# Patient Record
Sex: Male | Born: 1935
Health system: Southern US, Community
[De-identification: ages and names within clinical notes are randomized; demographics above are authoritative.]

## PROBLEM LIST (undated history)

## (undated) DIAGNOSIS — F0280 Dementia in other diseases classified elsewhere without behavioral disturbance: Secondary | ICD-10-CM

## (undated) DIAGNOSIS — E2749 Other adrenocortical insufficiency: Secondary | ICD-10-CM

## (undated) DIAGNOSIS — H8110 Benign paroxysmal vertigo, unspecified ear: Secondary | ICD-10-CM

## (undated) DIAGNOSIS — H9190 Unspecified hearing loss, unspecified ear: Secondary | ICD-10-CM

## (undated) DIAGNOSIS — Z87898 Personal history of other specified conditions: Secondary | ICD-10-CM

## (undated) DIAGNOSIS — B029 Zoster without complications: Secondary | ICD-10-CM

## (undated) DIAGNOSIS — N400 Enlarged prostate without lower urinary tract symptoms: Secondary | ICD-10-CM

## (undated) DIAGNOSIS — R519 Headache, unspecified: Secondary | ICD-10-CM

## (undated) DIAGNOSIS — L570 Actinic keratosis: Secondary | ICD-10-CM

## (undated) DIAGNOSIS — G43109 Migraine with aura, not intractable, without status migrainosus: Secondary | ICD-10-CM

## (undated) DIAGNOSIS — R51 Headache: Secondary | ICD-10-CM

## (undated) DIAGNOSIS — Z8673 Personal history of transient ischemic attack (TIA), and cerebral infarction without residual deficits: Secondary | ICD-10-CM

## (undated) DIAGNOSIS — M25561 Pain in right knee: Secondary | ICD-10-CM

## (undated) DIAGNOSIS — C4490 Unspecified malignant neoplasm of skin, unspecified: Secondary | ICD-10-CM

## (undated) DIAGNOSIS — Z87440 Personal history of urinary (tract) infections: Secondary | ICD-10-CM

## (undated) DIAGNOSIS — I4460 Unspecified fascicular block: Secondary | ICD-10-CM

## (undated) DIAGNOSIS — M5030 Other cervical disc degeneration, unspecified cervical region: Secondary | ICD-10-CM

## (undated) DIAGNOSIS — K089 Disorder of teeth and supporting structures, unspecified: Secondary | ICD-10-CM

## (undated) DIAGNOSIS — G309 Alzheimer's disease, unspecified: Secondary | ICD-10-CM

## (undated) HISTORY — DX: Actinic keratosis: L57.0

## (undated) HISTORY — DX: Benign prostatic hyperplasia without lower urinary tract symptoms: N40.0

## (undated) HISTORY — DX: Alzheimer's disease, unspecified: G30.9

## (undated) HISTORY — DX: Unspecified malignant neoplasm of skin, unspecified: C44.90

## (undated) HISTORY — PX: TONSILLECTOMY: SHX5217

## (undated) HISTORY — DX: Unspecified fascicular block: I44.60

## (undated) HISTORY — PX: OTHER SURGICAL HISTORY: SHX169

## (undated) HISTORY — DX: Personal history of urinary (tract) infections: Z87.440

## (undated) HISTORY — DX: Benign paroxysmal vertigo, unspecified ear: H81.10

## (undated) HISTORY — DX: Unspecified hearing loss, unspecified ear: H91.90

## (undated) HISTORY — DX: Zoster without complications: B02.9

## (undated) HISTORY — DX: Personal history of other specified conditions: Z87.898

## (undated) HISTORY — DX: Disorder of teeth and supporting structures, unspecified: K08.9

## (undated) HISTORY — DX: Other cervical disc degeneration, unspecified cervical region: M50.30

## (undated) HISTORY — DX: Headache: R51

## (undated) HISTORY — DX: Personal history of transient ischemic attack (TIA), and cerebral infarction without residual deficits: Z86.73

## (undated) HISTORY — DX: Pain in right knee: M25.561

## (undated) HISTORY — DX: Dementia in other diseases classified elsewhere, unspecified severity, without behavioral disturbance, psychotic disturbance, mood disturbance, and anxiety: F02.80

## (undated) HISTORY — DX: Other adrenocortical insufficiency: E27.49

## (undated) HISTORY — DX: Migraine with aura, not intractable, without status migrainosus: G43.109

## (undated) HISTORY — DX: Headache, unspecified: R51.9

---

## 1999-06-01 DIAGNOSIS — E2749 Other adrenocortical insufficiency: Secondary | ICD-10-CM

## 1999-06-01 HISTORY — DX: Other adrenocortical insufficiency: E27.49

## 1999-10-24 ENCOUNTER — Inpatient Hospital Stay (HOSPITAL_COMMUNITY): Admission: EM | Admit: 1999-10-24 | Discharge: 1999-10-26 | Payer: Self-pay

## 1999-10-24 ENCOUNTER — Encounter: Payer: Self-pay | Admitting: Emergency Medicine

## 1999-10-25 ENCOUNTER — Encounter: Payer: Self-pay | Admitting: Surgery

## 2000-01-06 ENCOUNTER — Encounter: Payer: Self-pay | Admitting: Surgery

## 2000-01-06 ENCOUNTER — Encounter: Admission: RE | Admit: 2000-01-06 | Discharge: 2000-01-06 | Payer: Self-pay | Admitting: Surgery

## 2000-04-18 ENCOUNTER — Ambulatory Visit (HOSPITAL_COMMUNITY): Admission: RE | Admit: 2000-04-18 | Discharge: 2000-04-18 | Payer: Self-pay | Admitting: Gastroenterology

## 2000-05-05 ENCOUNTER — Encounter: Admission: RE | Admit: 2000-05-05 | Discharge: 2000-05-05 | Payer: Self-pay | Admitting: Surgery

## 2000-05-05 ENCOUNTER — Encounter: Payer: Self-pay | Admitting: Surgery

## 2001-03-27 ENCOUNTER — Encounter: Admission: RE | Admit: 2001-03-27 | Discharge: 2001-03-27 | Payer: Self-pay | Admitting: *Deleted

## 2001-03-27 ENCOUNTER — Encounter: Payer: Self-pay | Admitting: *Deleted

## 2005-02-04 ENCOUNTER — Emergency Department (HOSPITAL_COMMUNITY): Admission: EM | Admit: 2005-02-04 | Discharge: 2005-02-04 | Payer: Self-pay | Admitting: Emergency Medicine

## 2005-04-07 HISTORY — PX: PROSTATE BIOPSY: SHX241

## 2006-02-04 ENCOUNTER — Encounter: Admission: RE | Admit: 2006-02-04 | Discharge: 2006-02-04 | Payer: Self-pay | Admitting: Neurology

## 2007-06-01 DIAGNOSIS — M25561 Pain in right knee: Secondary | ICD-10-CM

## 2007-06-01 HISTORY — DX: Pain in right knee: M25.561

## 2009-08-29 DIAGNOSIS — Z87440 Personal history of urinary (tract) infections: Secondary | ICD-10-CM

## 2009-08-29 HISTORY — DX: Personal history of urinary (tract) infections: Z87.440

## 2011-06-09 DIAGNOSIS — Z23 Encounter for immunization: Secondary | ICD-10-CM | POA: Diagnosis not present

## 2011-06-29 DIAGNOSIS — L570 Actinic keratosis: Secondary | ICD-10-CM | POA: Diagnosis not present

## 2011-06-29 DIAGNOSIS — Z85828 Personal history of other malignant neoplasm of skin: Secondary | ICD-10-CM | POA: Diagnosis not present

## 2011-07-29 DIAGNOSIS — G3184 Mild cognitive impairment, so stated: Secondary | ICD-10-CM | POA: Diagnosis not present

## 2011-07-29 DIAGNOSIS — M47812 Spondylosis without myelopathy or radiculopathy, cervical region: Secondary | ICD-10-CM | POA: Diagnosis not present

## 2011-07-29 DIAGNOSIS — M542 Cervicalgia: Secondary | ICD-10-CM | POA: Diagnosis not present

## 2011-08-03 DIAGNOSIS — G3184 Mild cognitive impairment, so stated: Secondary | ICD-10-CM | POA: Diagnosis not present

## 2011-08-03 DIAGNOSIS — R413 Other amnesia: Secondary | ICD-10-CM | POA: Diagnosis not present

## 2011-08-05 DIAGNOSIS — M542 Cervicalgia: Secondary | ICD-10-CM | POA: Diagnosis not present

## 2011-08-06 ENCOUNTER — Other Ambulatory Visit: Payer: Self-pay | Admitting: Neurology

## 2011-08-06 DIAGNOSIS — R413 Other amnesia: Secondary | ICD-10-CM

## 2011-08-12 ENCOUNTER — Ambulatory Visit
Admission: RE | Admit: 2011-08-12 | Discharge: 2011-08-12 | Disposition: A | Discharge status: Home / Self Care | Payer: Self-pay | Source: Ambulatory Visit | Attending: Neurology | Admitting: Neurology

## 2011-08-12 ENCOUNTER — Ambulatory Visit
Admission: RE | Admit: 2011-08-12 | Discharge: 2011-08-12 | Disposition: A | Discharge status: Home / Self Care | Payer: Medicare Other | Source: Ambulatory Visit | Attending: Neurology | Admitting: Neurology

## 2011-08-12 DIAGNOSIS — N401 Enlarged prostate with lower urinary tract symptoms: Secondary | ICD-10-CM | POA: Diagnosis not present

## 2011-08-12 DIAGNOSIS — R413 Other amnesia: Secondary | ICD-10-CM

## 2011-08-12 DIAGNOSIS — N138 Other obstructive and reflux uropathy: Secondary | ICD-10-CM | POA: Diagnosis not present

## 2011-08-12 DIAGNOSIS — M5030 Other cervical disc degeneration, unspecified cervical region: Secondary | ICD-10-CM | POA: Diagnosis not present

## 2011-08-12 DIAGNOSIS — N402 Nodular prostate without lower urinary tract symptoms: Secondary | ICD-10-CM | POA: Diagnosis not present

## 2011-08-12 DIAGNOSIS — R51 Headache: Secondary | ICD-10-CM | POA: Diagnosis not present

## 2011-08-12 DIAGNOSIS — R972 Elevated prostate specific antigen [PSA]: Secondary | ICD-10-CM | POA: Diagnosis not present

## 2011-08-17 DIAGNOSIS — M542 Cervicalgia: Secondary | ICD-10-CM | POA: Diagnosis not present

## 2011-08-19 DIAGNOSIS — M542 Cervicalgia: Secondary | ICD-10-CM | POA: Diagnosis not present

## 2011-08-25 DIAGNOSIS — M542 Cervicalgia: Secondary | ICD-10-CM | POA: Diagnosis not present

## 2011-08-27 DIAGNOSIS — M47812 Spondylosis without myelopathy or radiculopathy, cervical region: Secondary | ICD-10-CM | POA: Diagnosis not present

## 2011-08-27 DIAGNOSIS — G3184 Mild cognitive impairment, so stated: Secondary | ICD-10-CM | POA: Diagnosis not present

## 2011-08-27 DIAGNOSIS — M542 Cervicalgia: Secondary | ICD-10-CM | POA: Diagnosis not present

## 2011-09-20 DIAGNOSIS — M542 Cervicalgia: Secondary | ICD-10-CM | POA: Diagnosis not present

## 2011-09-20 DIAGNOSIS — G3184 Mild cognitive impairment, so stated: Secondary | ICD-10-CM | POA: Diagnosis not present

## 2011-10-13 ENCOUNTER — Ambulatory Visit (INDEPENDENT_AMBULATORY_CARE_PROVIDER_SITE_OTHER): Payer: Medicare Other | Admitting: Family Medicine

## 2011-10-13 ENCOUNTER — Encounter: Payer: Self-pay | Admitting: Family Medicine

## 2011-10-13 ENCOUNTER — Encounter: Payer: Self-pay | Admitting: *Deleted

## 2011-10-13 VITALS — BP 121/81 | HR 66 | Wt 152.0 lb

## 2011-10-13 DIAGNOSIS — R413 Other amnesia: Secondary | ICD-10-CM

## 2011-10-13 DIAGNOSIS — Z8673 Personal history of transient ischemic attack (TIA), and cerebral infarction without residual deficits: Secondary | ICD-10-CM

## 2011-10-13 DIAGNOSIS — F0390 Unspecified dementia without behavioral disturbance: Secondary | ICD-10-CM | POA: Insufficient documentation

## 2011-10-13 DIAGNOSIS — N400 Enlarged prostate without lower urinary tract symptoms: Secondary | ICD-10-CM | POA: Diagnosis not present

## 2011-10-13 DIAGNOSIS — R51 Headache: Secondary | ICD-10-CM

## 2011-10-13 DIAGNOSIS — R519 Headache, unspecified: Secondary | ICD-10-CM | POA: Insufficient documentation

## 2011-10-13 LAB — COMPREHENSIVE METABOLIC PANEL
ALT: 21 U/L (ref 0–53)
AST: 29 U/L (ref 0–37)
Albumin: 3.8 g/dL (ref 3.5–5.2)
Alkaline Phosphatase: 69 U/L (ref 39–117)
BUN: 12 mg/dL (ref 6–23)
CO2: 27 mEq/L (ref 19–32)
Calcium: 9.4 mg/dL (ref 8.4–10.5)
Chloride: 103 mEq/L (ref 96–112)
Creatinine, Ser: 1.1 mg/dL (ref 0.4–1.5)
GFR: 70.51 mL/min (ref >60.00)
Glucose, Bld: 82 mg/dL (ref 70–99)
Potassium: 4.2 mEq/L (ref 3.5–5.1)
Sodium: 138 mEq/L (ref 135–145)
Total Bilirubin: 0.8 mg/dL (ref 0.3–1.2)
Total Protein: 6.5 g/dL (ref 6.0–8.3)

## 2011-10-13 LAB — CBC WITH DIFFERENTIAL/PLATELET
Basophils Absolute: 0 10*3/uL (ref 0.0–0.1)
Basophils Relative: 0.3 % (ref 0.0–3.0)
Eosinophils Absolute: 0.2 10*3/uL (ref 0.0–0.7)
Eosinophils Relative: 2.4 % (ref 0.0–5.0)
HCT: 44.5 % (ref 39.0–52.0)
Hemoglobin: 14.9 g/dL (ref 13.0–17.0)
Lymphocytes Relative: 12.5 % (ref 12.0–46.0)
Lymphs Abs: 0.9 10*3/uL (ref 0.7–4.0)
MCHC: 33.4 g/dL (ref 30.0–36.0)
MCV: 100 fl (ref 78.0–100.0)
Monocytes Absolute: 0.8 10*3/uL (ref 0.1–1.0)
Monocytes Relative: 11.5 % (ref 3.0–12.0)
Neutro Abs: 5.4 10*3/uL (ref 1.4–7.7)
Neutrophils Relative %: 73.3 % (ref 43.0–77.0)
Platelets: 270 10*3/uL (ref 150.0–400.0)
RBC: 4.45 Mil/uL (ref 4.22–5.81)
RDW: 14 % (ref 11.5–14.6)
WBC: 7.3 10*3/uL (ref 4.5–10.5)

## 2011-10-13 LAB — TSH: TSH: 1.41 u[IU]/mL (ref 0.35–5.50)

## 2011-10-13 NOTE — Assessment & Plan Note (Signed)
Likely related to his cervical DDD and associated limited ROM. Improving with PT.

## 2011-10-13 NOTE — Assessment & Plan Note (Signed)
As per urologist.  I think his mild chronic pressure in groin region is related partially to this. Will obtain urologist's records.

## 2011-10-13 NOTE — Progress Notes (Signed)
Office Note 10/13/2011  CC:  Chief Complaint  Patient presents with  . Establish Care    discuss memory, headaches and prostate issues    HPI:  Mitchell Abbott is a 78 y.o. White male who is here alone today to establish care.  His wife comes here to see Dr. Abner Greenspan.  Wife told him to establish here, esp b/c he lives just down the road. Patient's most recent primary MD: he can't recall MD's name, but thinks it was at [facility]. Old records were not reviewed prior to or during today's visit.  Patient had some difficulty with recall during our history taking, so some details regarding dates, locations, names are fuzzy and we'll need to check old records for accuracy.  He essentially has no acute complaint today, has been getting problems managed by his neurologist and his urologist.  Has not seen primary MD much at all.  He cannot recall last cholesterol check or routine screening lab panel for glucose, electrolytes, liver testing, thyroid function.  He is still mildly bothered by a feeling of pressure in the prostate region but this was significantly improved with change from briefs to wearing boxer shorts.  He did get full urology eval for this complaint earlier this year and he was told there was no change to his exam and that his PSA was fine.  He seems to be responding well to some PT that his neurologist ordered for his cervico-cranial headache syndrome lately.   He did get some upper and lower GI endoscopy and other GI procedures in the past that we will get more detail on when we get Dr. Marlane Hatcher records.  Past Medical History  Diagnosis Date  . Hearing deficit     Bilateral; noise damage in [employer].  Has had hearing aids since about 2008.  . Migraine headache onset in teens    No migraines since about 2004  . BPH (benign prostatic hypertrophy)     Pt reports PSA was 1.2 in 2013 at urologist's (Dr. Vernie Ammons).  . Headache in back of head     Dr. Clarisse Gouge,  neurologist, treating him with PT for this problem and it helps  . Dementia     Dr. Clarisse Gouge; started aricept around 08/2011.  MRI 2013 showed extensive white matter disease; no acute problems.  . History of TIA (transient ischemic attack) @ 2008    02/2001 and 01/2006 carotid dopplers showed 50% or less stenosis.  . DDD (degenerative disc disease), cervical     C5-6, C6-7 primarily  . History of solitary pulmonary nodule     Initially noted on CT abd for his adrenal hemorrhage s/p fall from ladder 2001.  F/u CT chest 04/2000 showed that it had shrunk from 12mm to 6mm and had no malignant features.  . Adrenal hemorrhage 2001    s/p fall from ladder--a f/u CT abd showed resolution of the hemorrhage- (duodenal polyp incidentally noted; repeat scan at later date OR endoscopy was recommended to f/u this finding.).      Past Surgical History  Procedure Date  . Prostate biopsy approx 2012    benign per pt  . Tonsillectomy preteen  . Dental     iimplants    History reviewed. No pertinent family history. Patient doesn't recall any of this information.  History   Social History  . Marital Status: Married    Spouse Name: N/A    Number of Children: N/A  . Years of Education: N/A  Occupational History  . Not on file.   Social History Main Topics  . Smoking status: Former Smoker -- 2 years    Types: Cigarettes  . Smokeless tobacco: Never Used  . Alcohol Use: Yes     "a good drink every other day"  . Drug Use: No  . Sexually Active: Not on file   Other Topics Concern  . 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.Lives in [place].Orig from [address] of [place], relocated to [place] in late 1970s.Has 3 children from first marriage, one from second marriage.One daughter is an OB/GYN in [place], [place].No signif hx of smoking, drinks a whisky drink qod.Exercise: 3 X /week goes to  "the club" and does nautilus and walking machine, also swims 3 x/week.    Outpatient Encounter Prescriptions as of 10/13/2011  Medication Sig Dispense Refill  . alfuzosin (UROXATRAL) 10 MG 24 hr tablet Take 10 mg by mouth daily.      Marland Kitchen donepezil (ARICEPT) 5 MG tablet Take 5 mg by mouth at bedtime.        No Known Allergies  ROS Review of Systems  Constitutional: Negative for fever and fatigue.  HENT: Negative for congestion and sore throat.   Eyes: Negative for visual disturbance.  Respiratory: Negative for cough.   Cardiovascular: Negative for chest pain.  Gastrointestinal: Negative for nausea and abdominal pain.  Genitourinary: Negative for dysuria.  Musculoskeletal: Negative for back pain and joint swelling.  Skin: Negative for rash.  Neurological: Positive for headaches (as per HPI). Negative for weakness.  Hematological: Negative for adenopathy.    PE; Blood pressure 121/81, pulse 66, weight 152 lb (68.947 kg), SpO2 98.00%. Gen: Alert, well appearing.  Patient is oriented to person, place, time, and situation. ENT: Ears: EACs clear, normal epithelium.  TMs with good light reflex and landmarks bilaterally.  Eyes: no injection, icteris, swelling, or exudate.  EOMI, PERRLA. Nose: no drainage or turbinate edema/swelling.  No injection or focal lesion.  Mouth: lips without lesion/swelling.  Oral mucosa pink and moist.  Oropharynx without erythema, exudate, or swelling.  Neck: supple/nontender.  No LAD, mass, or TM.  Carotid pulses 2+ bilaterally, without bruits. CV: RRR, no m/r/g.   LUNGS: CTA bilat, nonlabored resps, good aeration in all lung fields. ABD: soft, NT, ND, BS normal.  No hepatospenomegaly or mass.  No bruits. EXT: no clubbing, cyanosis, or edema.   Pertinent labs:  None today  ASSESSMENT AND PLAN:   New pt, difficult historian, need all old records.  Dementia Managed as per neurologist, Dr. Clarisse Gouge.  Will obtain his records.  BPH (benign prostatic  hypertrophy) As per urologist.  I think his mild chronic pressure in groin region is related partially to this. Will obtain urologist's records.  Headache disorder Likely related to his cervical DDD and associated limited ROM. Improving with PT.   GI findings in the past: duodenal polyp.  I know he had upper and lower endoscopy in the past as well as a biopsy so we'll ask Dr. Ewing Schlein for records.  Return in about 1 month (around 11/13/2011) for general recheck after med records received.

## 2011-10-13 NOTE — Assessment & Plan Note (Signed)
Managed as per neurologist, Dr. Clarisse Gouge.  Will obtain his records.

## 2011-10-14 ENCOUNTER — Encounter: Payer: Self-pay | Admitting: Family Medicine

## 2011-10-19 DIAGNOSIS — Z8673 Personal history of transient ischemic attack (TIA), and cerebral infarction without residual deficits: Secondary | ICD-10-CM | POA: Insufficient documentation

## 2011-10-20 ENCOUNTER — Other Ambulatory Visit: Payer: Medicare Other

## 2011-10-27 ENCOUNTER — Encounter: Payer: Self-pay | Admitting: Family Medicine

## 2011-11-16 DIAGNOSIS — L570 Actinic keratosis: Secondary | ICD-10-CM | POA: Diagnosis not present

## 2011-11-16 DIAGNOSIS — Z85828 Personal history of other malignant neoplasm of skin: Secondary | ICD-10-CM | POA: Diagnosis not present

## 2011-11-16 DIAGNOSIS — C44721 Squamous cell carcinoma of skin of unspecified lower limb, including hip: Secondary | ICD-10-CM | POA: Diagnosis not present

## 2011-11-18 ENCOUNTER — Ambulatory Visit (INDEPENDENT_AMBULATORY_CARE_PROVIDER_SITE_OTHER): Payer: Medicare Other | Admitting: Family Medicine

## 2011-11-18 ENCOUNTER — Encounter: Payer: Self-pay | Admitting: Family Medicine

## 2011-11-18 VITALS — BP 111/72 | HR 67 | Ht 66.5 in | Wt 154.0 lb

## 2011-11-18 DIAGNOSIS — N400 Enlarged prostate without lower urinary tract symptoms: Secondary | ICD-10-CM

## 2011-11-18 DIAGNOSIS — F0390 Unspecified dementia without behavioral disturbance: Secondary | ICD-10-CM

## 2011-11-18 DIAGNOSIS — R51 Headache: Secondary | ICD-10-CM

## 2011-11-18 DIAGNOSIS — R519 Headache, unspecified: Secondary | ICD-10-CM

## 2011-11-18 NOTE — Progress Notes (Signed)
OFFICE NOTE  11/20/2011  CC:  Chief Complaint  Patient presents with  . Follow-up    memory, prostate after records received     HPI: Patient is a 78 y.o. Caucasian male who is here for 1 month f/u after records received.  He was a new pt last visit and due to some dementia he didn't have much complete info and we decided to have him come back after records were received. His wife is with him today.   He apparently saw his urologist since his last visit with me, was having some pressure-like suprapubic discomfort, apparently eval was done and they seem to say nothing showed up, no meds changed.  I have no record of this visit.  They have questions for me about his specialists' visits (urologist and neurologist), and we discussed some general BPH issues, general dementia issues.  Overall his wife and he thing that he hasn't responded any significant amount to the aricept that was started and titrated up to 10mg  in the last few months.  He apparently also had some skin lesions removed at his dermatologist's recently--Dr. Stefanie Libel in [place].  Pertinent PMH:  Past Medical History  Diagnosis Date  . Hearing deficit     Bilateral; noise damage in [employer].  Has had hearing aids since about 2008.  . Migraine headache with aura onset in teens    No migraines since about 2004  . BPH (benign prostatic hypertrophy)     History of acute urinary retention (2006).  Hx of elevated PSA (7.26) + right sided prostate nodule on DRE by urologist.  PSA did not go down with tx with antibiotics--biopsy was done and showed NO MALIGNANCY but some chronic inflammation was present--turned out related to acute cystitis (enterococcus UTI).  PSA was 1.17 in 07/2011 at urologist's (Dr. Vernie Ammons).  . Headache in back of head     Dr. Clarisse Gouge, neurologist, treating him with PT for this problem and it helps  . Dementia     Early/mild alzheimer's:  Dr. Clarisse Gouge; started aricept around 08/2011.  MRI 2013 showed extensive  white matter disease; no acute problems.  . History of TIA (transient ischemic attack) @ 2008    02/2001 and 01/2006 carotid dopplers showed 50% or less stenosis.  . DDD (degenerative disc disease), cervical     C5-6, C6-7 primarily  . History of solitary pulmonary nodule     Initially noted on CT abd for his adrenal hemorrhage s/p fall from ladder 2001.  F/u CT chest 04/2000 showed that it had shrunk from 12mm to 6mm and had no malignant features.  . Adrenal hemorrhage 2001    s/p fall from ladder--a f/u CT abd showed resolution of the hemorrhage- (duodenal polyp incidentally noted;  f/u EGD by Dr. Ewing Schlein 03/2000 showed small lipomatous mass in duodenum which was confirmed by biopsy.  Marland Kitchen BPPV (benign paroxysmal positional vertigo)   . History of acute cystitis 08/2009    with acute prostatitis (enterococcus faecalis)  . Actinic keratoses   . Left bundle branch hemiblock     mentioned in dx of old records 04/15/2009  . Arthralgia of knee, right 2009    exam suspicious for meniscal origin    MEDS:  Outpatient Prescriptions Prior to Visit  Medication Sig Dispense Refill  . alfuzosin (UROXATRAL) 10 MG 24 hr tablet Take 10 mg by mouth daily.      Marland Kitchen aspirin 81 MG tablet Take 81 mg by mouth daily.      Marland Kitchen donepezil (  ARICEPT) 5 MG tablet Take 5 mg by mouth at bedtime.        PE: Blood pressure 111/72, pulse 67, height 5' 6.5" (1.689 m), weight 154 lb (69.854 kg). Gen: Alert, well appearing.  Patient is oriented to person, place, time, and situation. Pt displays lucid thought and speech, with occasional word finding difficulty.  Affect pleasant, witty, humorous. No further exam today.  IMPRESSION AND PLAN:  Dementia No signif response to aricept.  He has a neurologist, Dr. Clarisse Gouge, who is working on this problem with him. Will try to get up to date notes and keep abreast of things with this problem.  BPH (benign prostatic hypertrophy) Question of residual symptoms but he has recently seen  his urologist, so I'll ask for this note to get more info.  Headache disorder Occipital HA's: This has apparently improved with PT, and seemed to be secondary to cervical spine DDD.   Hx of TIA: We'll have him return for recheck in October this year and get FLP at that time.  Continue Aspirin 81mg  qd.  FOLLOW UP: 4 mo

## 2011-11-20 NOTE — Assessment & Plan Note (Signed)
Occipital HA's: This has apparently improved with PT, and seemed to be secondary to cervical spine DDD.

## 2011-11-20 NOTE — Assessment & Plan Note (Signed)
No signif response to aricept.  He has a neurologist, Dr. Clarisse Gouge, who is working on this problem with him. Will try to get up to date notes and keep abreast of things with this problem.

## 2011-11-20 NOTE — Assessment & Plan Note (Signed)
Question of residual symptoms but he has recently seen his urologist, so I'll ask for this note to get more info.

## 2011-11-23 DIAGNOSIS — G309 Alzheimer's disease, unspecified: Secondary | ICD-10-CM | POA: Diagnosis not present

## 2011-11-23 DIAGNOSIS — F0280 Dementia in other diseases classified elsewhere without behavioral disturbance: Secondary | ICD-10-CM | POA: Diagnosis not present

## 2011-11-25 ENCOUNTER — Telehealth: Payer: Self-pay | Admitting: Family Medicine

## 2011-12-07 ENCOUNTER — Other Ambulatory Visit: Payer: Self-pay | Admitting: Family Medicine

## 2011-12-07 DIAGNOSIS — G3184 Mild cognitive impairment, so stated: Secondary | ICD-10-CM

## 2011-12-07 NOTE — Telephone Encounter (Signed)
See referral order I put in for this today.-thx

## 2011-12-20 DIAGNOSIS — G459 Transient cerebral ischemic attack, unspecified: Secondary | ICD-10-CM | POA: Diagnosis not present

## 2011-12-20 DIAGNOSIS — H6120 Impacted cerumen, unspecified ear: Secondary | ICD-10-CM | POA: Diagnosis not present

## 2011-12-20 DIAGNOSIS — N400 Enlarged prostate without lower urinary tract symptoms: Secondary | ICD-10-CM | POA: Diagnosis not present

## 2011-12-20 DIAGNOSIS — F068 Other specified mental disorders due to known physiological condition: Secondary | ICD-10-CM | POA: Diagnosis not present

## 2011-12-23 DIAGNOSIS — Z85828 Personal history of other malignant neoplasm of skin: Secondary | ICD-10-CM | POA: Diagnosis not present

## 2011-12-30 DIAGNOSIS — C44721 Squamous cell carcinoma of skin of unspecified lower limb, including hip: Secondary | ICD-10-CM | POA: Diagnosis not present

## 2011-12-31 ENCOUNTER — Other Ambulatory Visit: Payer: Medicare Other

## 2011-12-31 DIAGNOSIS — R413 Other amnesia: Secondary | ICD-10-CM | POA: Diagnosis not present

## 2012-01-05 ENCOUNTER — Encounter: Payer: Self-pay | Admitting: Family Medicine

## 2012-01-05 ENCOUNTER — Ambulatory Visit (INDEPENDENT_AMBULATORY_CARE_PROVIDER_SITE_OTHER): Payer: Medicare Other | Admitting: Family Medicine

## 2012-01-05 VITALS — BP 135/82 | HR 57 | Temp 97.2°F | Ht 66.5 in | Wt 151.0 lb

## 2012-01-05 DIAGNOSIS — Z0000 Encounter for general adult medical examination without abnormal findings: Secondary | ICD-10-CM

## 2012-01-05 DIAGNOSIS — R5383 Other fatigue: Secondary | ICD-10-CM | POA: Diagnosis not present

## 2012-01-05 DIAGNOSIS — N400 Enlarged prostate without lower urinary tract symptoms: Secondary | ICD-10-CM

## 2012-01-05 DIAGNOSIS — E538 Deficiency of other specified B group vitamins: Secondary | ICD-10-CM

## 2012-01-05 DIAGNOSIS — R413 Other amnesia: Secondary | ICD-10-CM

## 2012-01-05 DIAGNOSIS — F0390 Unspecified dementia without behavioral disturbance: Secondary | ICD-10-CM | POA: Diagnosis not present

## 2012-01-05 DIAGNOSIS — I6529 Occlusion and stenosis of unspecified carotid artery: Secondary | ICD-10-CM

## 2012-01-05 DIAGNOSIS — Z23 Encounter for immunization: Secondary | ICD-10-CM | POA: Diagnosis not present

## 2012-01-05 DIAGNOSIS — N320 Bladder-neck obstruction: Secondary | ICD-10-CM | POA: Diagnosis not present

## 2012-01-05 DIAGNOSIS — Z125 Encounter for screening for malignant neoplasm of prostate: Secondary | ICD-10-CM | POA: Diagnosis not present

## 2012-01-05 DIAGNOSIS — Z87898 Personal history of other specified conditions: Secondary | ICD-10-CM

## 2012-01-05 DIAGNOSIS — R5381 Other malaise: Secondary | ICD-10-CM

## 2012-01-05 LAB — LIPID PANEL
Cholesterol: 173 mg/dL (ref 0–200)
HDL: 51.6 mg/dL (ref >39.00)
LDL Cholesterol: 110 mg/dL — ABNORMAL HIGH (ref 0–99)
Total CHOL/HDL Ratio: 3
Triglycerides: 58 mg/dL (ref 0.0–149.0)
VLDL: 11.6 mg/dL (ref 0.0–40.0)

## 2012-01-05 LAB — URINALYSIS, ROUTINE W REFLEX MICROSCOPIC
Bilirubin Urine: NEGATIVE
Hgb urine dipstick: NEGATIVE
Ketones, ur: NEGATIVE
Leukocytes, UA: NEGATIVE
Nitrite: NEGATIVE
Specific Gravity, Urine: 1.015 (ref 1.000–1.030)
Total Protein, Urine: NEGATIVE
Urine Glucose: NEGATIVE
Urobilinogen, UA: 0.2 (ref 0.0–1.0)
pH: 6 (ref 5.0–8.0)

## 2012-01-05 LAB — PSA, MEDICARE: PSA: 1.3 ng/ml (ref 0.10–4.00)

## 2012-01-05 NOTE — Progress Notes (Addendum)
Office Note 01/05/2012  CC:  Chief Complaint  Patient presents with  . Annual Exam    no problems    HPI:  Mitchell Abbott is a 78 y.o. White male who is here by himself today for health maintenance exam. Says he is feeling well.  Saw new neurologist and he and his wife were pleased with her, although he can not tell me any further details about his visit.  No records available yet.   Says his BPH sx's are relatively well controlled on current alpha blocker, denies side effect.  Says he has nocturia avg 3 times per night, says daytime urgency/frequency are not really a problem.  Only has dysuria if he attempts to hold urine too long.   Last note from [facility] was from 08/2010--this is when last DRE and PSA were done. He mentions vit B12 today, not sure if he means he needs this shot or he needs vit b12 level checked.  He seems to not be sure if he's had a hx of this.  In review of record today I cannot find that a B12 test has been done, so I'll check on this today. Also, has hx of carotid artery stenosis/TIA, so will check FLP today.  Past Medical History  Diagnosis Date  . Hearing deficit     Bilateral; noise damage in [employer].  Has had hearing aids since about 2008.  . Migraine headache with aura onset in teens    No migraines since about 2004  . BPH (benign prostatic hypertrophy)     History of acute urinary retention (2006).  Hx of elevated PSA (7.26) + right sided prostate nodule on DRE by urologist.  PSA did not go down with tx with antibiotics--biopsy was done and showed NO MALIGNANCY but some chronic inflammation was present--turned out related to acute cystitis (enterococcus UTI).  PSA was 1.17 in 07/2011 at urologist's (Dr. Vernie Ammons).  . Headache in back of head     Dr. Clarisse Gouge, neurologist, treating him with PT for this problem and it helps  . Dementia     Early/mild alzheimer's:  Dr. Clarisse Gouge; started aricept around 08/2011.  MRI 2013 showed extensive white matter  disease; no acute problems.  . History of TIA (transient ischemic attack) @ 2008    02/2001 and 01/2006 carotid dopplers showed 50% or less stenosis.  . DDD (degenerative disc disease), cervical     C5-6, C6-7 primarily  . History of solitary pulmonary nodule     Initially noted on CT abd for his adrenal hemorrhage s/p fall from ladder 2001.  F/u CT chest 04/2000 showed that it had shrunk from 12mm to 6mm and had no malignant features.  . Adrenal hemorrhage 2001    s/p fall from ladder--a f/u CT abd showed resolution of the hemorrhage- (duodenal polyp incidentally noted;  f/u EGD by Dr. Ewing Schlein 03/2000 showed small lipomatous mass in duodenum which was confirmed by biopsy.  Marland Kitchen BPPV (benign paroxysmal positional vertigo)   . History of acute cystitis 08/2009    with acute prostatitis (enterococcus faecalis)  . Actinic keratoses   . Left bundle branch hemiblock     mentioned in dx of old records 04/15/2009  . Arthralgia of knee, right 2009    exam suspicious for meniscal origin    Past Surgical History  Procedure Date  . Prostate biopsy 04/07/2005    For elevated PSA + right sided prostate nodule on DRE by urologist:  benign biopsy.  . Tonsillectomy preteen  .  Dental     iimplants  . Cataract extrac     Family History  Problem Relation Age of Onset  . Breast cancer Daughter   . Migraines Daughter   . Sudden death Mother 76    FH info obtained from Dr. Cherie Ouch records.    History   Social History  . Marital Status: Married    Spouse Name: N/A    Number of Children: N/A  . Years of Education: N/A   Occupational History  . Not on file.   Social History Main Topics  . Smoking status: Former Smoker -- 2 years    Types: Cigarettes  . Smokeless tobacco: Never Used  . Alcohol Use: Yes     "a good drink every other day"  . Drug Use: No  . Sexually Active: Not on file   Other Topics Concern  . 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.Lives in [place].Orig from [address] of [place], relocated to [place] in late 1970s.Has 3 children from first marriage, one from second marriage.One daughter is an OB/GYN in [place].Distant hx of smoking cigs, quit 1974, drinks a whisky drink qod.Exercise: 3 X /week goes to "the club" and does nautilus and walking machine, also swims 3 x/week.    Outpatient Prescriptions Prior to Visit  Medication Sig Dispense Refill  . alfuzosin (UROXATRAL) 10 MG 24 hr tablet Take 10 mg by mouth daily.      Marland Kitchen aspirin 81 MG tablet Take 81 mg by mouth daily.      Marland Kitchen donepezil (ARICEPT) 10 MG tablet Take 10 mg by mouth at bedtime.        No Known Allergies  ROS Review of Systems  Constitutional: Positive for fatigue (mild, comes and goes). Negative for fever, chills and appetite change.  HENT: Negative for ear pain, congestion, sore throat, neck stiffness and dental problem.   Eyes: Negative for discharge, redness and visual disturbance.  Respiratory: Negative for cough, chest tightness, shortness of breath and wheezing.   Cardiovascular: Negative for chest pain, palpitations and leg swelling.  Gastrointestinal: Negative for nausea, vomiting, abdominal pain, diarrhea and blood in stool.  Genitourinary: Negative for dysuria, urgency, frequency, hematuria, flank pain and difficulty urinating.  Musculoskeletal: Negative for myalgias, back pain, joint swelling and arthralgias.  Skin: Negative for pallor and rash.  Neurological: Negative for dizziness, speech difficulty, weakness and headaches.  Hematological: Negative for adenopathy. Does not bruise/bleed easily.  Psychiatric/Behavioral: Negative for disturbed wake/sleep cycle and dysphoric mood. The patient is not nervous/anxious.     PE; Blood pressure 135/82, pulse 57, temperature 97.2 F (36.2 C), temperature source Temporal, height 5' 6.5" (1.689 m), weight 151 lb (68.493  kg). Gen: Alert, well appearing.  Patient is oriented to person, place, time, and situation. Affect: pleasant, lucid thought and speech.  Occasional stumbles/searches for the right word when speaking. ENT: Ears: EACs clear, normal epithelium.  TMs with good light reflex and landmarks bilaterally.  Eyes: no injection, icteris, swelling, or exudate.  EOMI, PERRLA. Nose: no drainage or turbinate edema/swelling.  No injection or focal lesion.  Mouth: lips without lesion/swelling.  Oral mucosa pink and moist.  Dentition intact (upper teeth are implants) and without obvious caries or gingival swelling.  Oropharynx without erythema, exudate, or swelling.  Neck: supple/nontender.  No LAD, mass, or TM.  Carotid pulses 2+ bilaterally, without bruits.  CV: RRR, no m/r/g.   LUNGS: CTA bilat, nonlabored resps, good aeration in all lung fields. ABD: soft, NT, ND, BS normal.  No hepatospenomegaly or mass.  No bruits. EXT: no clubbing, cyanosis, or edema.  Skin - no sores or suspicious lesions or rashes or color changes Neuro: CN 2-12 intact bilaterally, strength 5/5 in proximal and distal upper extremities and lower extremities bilaterally.  No sensory deficits.  No tremor.  No disdiadochokinesis.  No ataxia.  Upper extremity and lower extremity DTRs symmetric.  No pronator drift. Genitals normal; both testes normal without tenderness, masses, hydroceles, varicoceles, erythema or swelling. Shaft normal, circumcised, meatus normal without discharge. No inguinal hernia noted. No inguinal lymphadenopathy. Rectal exam: negative without mass, lesions or tenderness, PROSTATE EXAM: smooth and symmetric without nodules or tenderness.     Pertinent labs:  None today  ASSESSMENT AND PLAN:   Health maintenance examination Reviewed age and gender appropriate health maintenance issues (prudent diet, regular exercise, health risks of tobacco and excessive alcohol, use of seatbelts, fire alarms in home, use of sunscreen).   Also reviewed age and gender appropriate health screening as well as vaccine recommendations. There is no record of his last Td.  No record of ever having pneumovax or zostavax.   Gave pneumovax today, and will give the others at future appts. Regarding his hx of elevated PSA and BPH, will check PSA today and UA. He mentions vit B12 shots and it is unclear about whether or not he has been documented to have vit b12 deficiency, so we'll check vit B12 level today. He is happy with his neurologist and the current approach to his dementia--will obtain records for review and placement in our chart. We'll screen his lipids today.  His CBC, CMET, and TSH were normal here a few months ago.     FOLLOW UP:  Return in about 6 months (around 07/07/2012) for f/u dementia, BPH.   ADDENDUM 01/05/12: I reviewed pt's neurology consult note from 12/31/11 with Dr. Terrace Arabia at [facility]. She dx'd him with mild cognitive impairment of unclear etiology.  She ordered a vit B12 level and carotid dopplers.  She kept him on aricept 5mg  qd and started galantamine 4mg  once daily.  F/u in 2-3 mo in her office. Will cancel the vit B12 level ordered here today.

## 2012-01-05 NOTE — Addendum Note (Signed)
Addended by: Baldemar Lenis R on: 01/05/2012 12:11 PM   Modules accepted: Orders

## 2012-01-05 NOTE — Assessment & Plan Note (Signed)
Reviewed age and gender appropriate health maintenance issues (prudent diet, regular exercise, health risks of tobacco and excessive alcohol, use of seatbelts, fire alarms in home, use of sunscreen).  Also reviewed age and gender appropriate health screening as well as vaccine recommendations. There is no record of his last Td.  No record of ever having pneumovax or zostavax.   Gave pneumovax today, and will give the others at future appts. Regarding his hx of elevated PSA and BPH, will check PSA today and UA. He mentions vit B12 shots and it is unclear about whether or not he has been documented to have vit b12 deficiency, so we'll check vit B12 level today. He is happy with his neurologist and the current approach to his dementia--will obtain records for review and placement in our chart. We'll screen his lipids today.  His CBC, CMET, and TSH were normal here a few months ago.

## 2012-01-06 LAB — VITAMIN B12: Vitamin B-12: 311 pg/mL (ref 211–911)

## 2012-01-06 NOTE — Addendum Note (Signed)
Addended by: Baldemar Lenis R on: 01/06/2012 02:26 PM   Modules accepted: Orders

## 2012-01-11 DIAGNOSIS — R0989 Other specified symptoms and signs involving the circulatory and respiratory systems: Secondary | ICD-10-CM | POA: Diagnosis not present

## 2012-01-28 ENCOUNTER — Telehealth: Payer: Self-pay | Admitting: Family Medicine

## 2012-01-28 NOTE — Telephone Encounter (Signed)
Printed and mailed

## 2012-01-28 NOTE — Telephone Encounter (Signed)
Please mail a copy of last lab results to patient

## 2012-02-01 ENCOUNTER — Ambulatory Visit (INDEPENDENT_AMBULATORY_CARE_PROVIDER_SITE_OTHER): Payer: Medicare Other | Admitting: Family Medicine

## 2012-02-01 ENCOUNTER — Encounter: Payer: Self-pay | Admitting: Family Medicine

## 2012-02-01 VITALS — BP 110/67 | HR 67 | Temp 98.2°F | Ht 66.5 in | Wt 151.0 lb

## 2012-02-01 DIAGNOSIS — N390 Urinary tract infection, site not specified: Secondary | ICD-10-CM

## 2012-02-01 DIAGNOSIS — R350 Frequency of micturition: Secondary | ICD-10-CM

## 2012-02-01 LAB — POCT URINALYSIS DIPSTICK
Glucose, UA: NEGATIVE
Nitrite, UA: NEGATIVE
Protein, UA: 30
Spec Grav, UA: 1.025
Urobilinogen, UA: 0.2
pH, UA: 6

## 2012-02-01 MED ORDER — SULFAMETHOXAZOLE-TMP DS 800-160 MG PO TABS: 1.0000 | ORAL_TABLET | Freq: Two times a day (BID) | ORAL | Status: DC

## 2012-02-01 NOTE — Progress Notes (Signed)
OFFICE NOTE  02/01/2012  CC:  Chief Complaint  Patient presents with  . Urinary Tract Infection    frequency, discomfort; began taking sulfa yesterday     HPI: Patient is a 78 y.o. Caucasian male who is here for pt's suspicion that he has a UTI. Acute worsening of his BPH obstructive sx's about 3 days ago; notes discomfort in pubic area upon urination, more hesitancy, some incomplete emptying, urinary frequency--notes it more during night but has in daytime as well.  Says the day of onset he had temp 101 but this did not recur.  No flank pain or back pain.  No pain in rectal region, no discomfort between episodes of urinating.  No hematuria.  BMs regular and without excessive caliber or hardness.  After sx's came up a few days ago he began taking some bactrim that he had leftover at home.  Pertinent PMH:  Past Medical History  Diagnosis Date  . Hearing deficit     Bilateral; noise damage in [employer].  Has had hearing aids since about 2008.  . Migraine headache with aura onset in teens    No migraines since about 2004  . BPH (benign prostatic hypertrophy)     History of acute urinary retention (2006).  Hx of elevated PSA (7.26) + right sided prostate nodule on DRE by urologist.  PSA did not go down with tx with antibiotics--biopsy was done and showed NO MALIGNANCY but some chronic inflammation was present--turned out related to acute cystitis (enterococcus UTI).  PSA was 1.17 in 07/2011 at urologist's (Dr. Vernie Ammons).  . Headache in back of head     Dr. Clarisse Gouge, neurologist, treating him with PT for this problem and it helps  . Dementia     Early/mild alzheimer's:  Dr. Clarisse Gouge; started aricept around 08/2011.  MRI 2013 showed extensive white matter disease; no acute problems.  . History of TIA (transient ischemic attack) @ 2008    02/2001 and 01/2006 carotid dopplers showed 50% or less stenosis.  . DDD (degenerative disc disease), cervical     C5-6, C6-7 primarily  . History of solitary  pulmonary nodule     Initially noted on CT abd for his adrenal hemorrhage s/p fall from ladder 2001.  F/u CT chest 04/2000 showed that it had shrunk from 12mm to 6mm and had no malignant features.  . Adrenal hemorrhage 2001    s/p fall from ladder--a f/u CT abd showed resolution of the hemorrhage- (duodenal polyp incidentally noted;  f/u EGD by Dr. Ewing Schlein 03/2000 showed small lipomatous mass in duodenum which was confirmed by biopsy.  Marland Kitchen BPPV (benign paroxysmal positional vertigo)   . History of acute cystitis 08/2009    with acute prostatitis (enterococcus faecalis)  . Actinic keratoses   . Left bundle branch hemiblock     mentioned in dx of old records 04/15/2009  . Arthralgia of knee, right 2009    exam suspicious for meniscal origin   Past surgical, social, and family history reviewed and no changes noted since last office visit.  MEDS:  Outpatient Prescriptions Prior to Visit  Medication Sig Dispense Refill  . alfuzosin (UROXATRAL) 10 MG 24 hr tablet Take 10 mg by mouth daily.      Marland Kitchen aspirin 81 MG tablet Take 81 mg by mouth daily.      Marland Kitchen donepezil (ARICEPT) 10 MG tablet Take 5 mg by mouth at bedtime.       Marland Kitchen galantamine (RAZADYNE) 4 MG tablet Take 4 mg by mouth  daily.        PE: Blood pressure 110/67, pulse 67, temperature 98.2 F (36.8 C), temperature source Temporal, height 5' 6.5" (1.689 m), weight 151 lb (68.493 kg). Gen: Alert, well appearing.  Patient is oriented to person, place, time, and situation. No flank or CVA tenderness.  Suprapubic region is soft, nontender.  LAB: CC UA today showed trace intact blood and small LEU.  IMPRESSION AND PLAN:  UTI (lower urinary tract infection) Cystitis vs acute prostatitis. Send urine for c/s. Continue with bactrim DS bid through a 14d course.   FOLLOW UP: prn

## 2012-02-01 NOTE — Assessment & Plan Note (Signed)
Cystitis vs acute prostatitis. Send urine for c/s. Continue with bactrim DS bid through a 14d course.

## 2012-02-03 LAB — URINE CULTURE
Colony Count: NO GROWTH
Organism ID, Bacteria: NO GROWTH

## 2012-02-14 DIAGNOSIS — IMO0002 Reserved for concepts with insufficient information to code with codable children: Secondary | ICD-10-CM | POA: Diagnosis not present

## 2012-02-14 DIAGNOSIS — M5030 Other cervical disc degeneration, unspecified cervical region: Secondary | ICD-10-CM | POA: Diagnosis not present

## 2012-02-14 DIAGNOSIS — M9981 Other biomechanical lesions of cervical region: Secondary | ICD-10-CM | POA: Diagnosis not present

## 2012-02-14 DIAGNOSIS — M999 Biomechanical lesion, unspecified: Secondary | ICD-10-CM | POA: Diagnosis not present

## 2012-02-15 DIAGNOSIS — IMO0002 Reserved for concepts with insufficient information to code with codable children: Secondary | ICD-10-CM | POA: Diagnosis not present

## 2012-02-15 DIAGNOSIS — M5030 Other cervical disc degeneration, unspecified cervical region: Secondary | ICD-10-CM | POA: Diagnosis not present

## 2012-02-15 DIAGNOSIS — M9981 Other biomechanical lesions of cervical region: Secondary | ICD-10-CM | POA: Diagnosis not present

## 2012-02-15 DIAGNOSIS — M999 Biomechanical lesion, unspecified: Secondary | ICD-10-CM | POA: Diagnosis not present

## 2012-02-16 DIAGNOSIS — M5030 Other cervical disc degeneration, unspecified cervical region: Secondary | ICD-10-CM | POA: Diagnosis not present

## 2012-02-16 DIAGNOSIS — M999 Biomechanical lesion, unspecified: Secondary | ICD-10-CM | POA: Diagnosis not present

## 2012-02-16 DIAGNOSIS — IMO0002 Reserved for concepts with insufficient information to code with codable children: Secondary | ICD-10-CM | POA: Diagnosis not present

## 2012-02-16 DIAGNOSIS — M9981 Other biomechanical lesions of cervical region: Secondary | ICD-10-CM | POA: Diagnosis not present

## 2012-02-18 ENCOUNTER — Ambulatory Visit (INDEPENDENT_AMBULATORY_CARE_PROVIDER_SITE_OTHER): Payer: Medicare Other | Admitting: Family Medicine

## 2012-02-18 ENCOUNTER — Encounter: Payer: Self-pay | Admitting: Family Medicine

## 2012-02-18 VITALS — BP 115/75 | HR 62 | Ht 66.5 in | Wt 147.0 lb

## 2012-02-18 DIAGNOSIS — M9981 Other biomechanical lesions of cervical region: Secondary | ICD-10-CM | POA: Diagnosis not present

## 2012-02-18 DIAGNOSIS — M533 Sacrococcygeal disorders, not elsewhere classified: Secondary | ICD-10-CM | POA: Diagnosis not present

## 2012-02-18 DIAGNOSIS — M5030 Other cervical disc degeneration, unspecified cervical region: Secondary | ICD-10-CM | POA: Diagnosis not present

## 2012-02-18 DIAGNOSIS — IMO0002 Reserved for concepts with insufficient information to code with codable children: Secondary | ICD-10-CM | POA: Diagnosis not present

## 2012-02-18 DIAGNOSIS — M999 Biomechanical lesion, unspecified: Secondary | ICD-10-CM | POA: Diagnosis not present

## 2012-02-18 NOTE — Assessment & Plan Note (Signed)
Discussed dx. Discussed stretches, heat, massage, naprelan 500 mg bid x 4d. If not significantly improved in 1 week, will refer for PT.

## 2012-02-18 NOTE — Patient Instructions (Addendum)
Take Naprelan samples: 500 mg, 1 tab twice daily x 4d.

## 2012-02-18 NOTE — Progress Notes (Signed)
OFFICE NOTE  02/18/2012  CC:  Chief Complaint  Patient presents with  . Hip Pain    right hip x 3-4 days, felt better after swimming this AM.     HPI: Patient is a 78 y.o. Caucasian male who is here for concern of hip pain. He has 3-4 d hx of pain in a fairly focal region of right posterior pelvic/SI joint area.  Some movements make it worse, others relieve it.  The pain is mild-moderate, makes him alter his gait some.  No injury or strain recalled.  No hx of low back or hip problems at all.  Of note, his proctatitis from last o/v completely resolved nicely on the antibiotic I gave him and he is pleased.  Pertinent PMH:  Past Medical History  Diagnosis Date  . Hearing deficit     Bilateral; noise damage in [employer].  Has had hearing aids since about 2008.  . Migraine headache with aura onset in teens    No migraines since about 2004  . BPH (benign prostatic hypertrophy)     History of acute urinary retention (2006).  Hx of elevated PSA (7.26) + right sided prostate nodule on DRE by urologist.  PSA did not go down with tx with antibiotics--biopsy was done and showed NO MALIGNANCY but some chronic inflammation was present--turned out related to acute cystitis (enterococcus UTI).  PSA was 1.17 in 07/2011 at urologist's (Dr. Vernie Ammons).  . Headache in back of head     Neurologist is treating him with PT for this problem and it helps  . Dementia     Early/mild alzheimer's:  Dr. Clarisse Gouge; started aricept around 08/2011.  MRI 2013 showed extensive white matter disease; no acute problems.  Pt switched neurologic care to [facility] 11/2011.  Marland Kitchen History of TIA (transient ischemic attack) @ 2008    02/2001 and 01/2006 carotid dopplers showed 50% or less stenosis.  . DDD (degenerative disc disease), cervical     C5-6, C6-7 primarily  . History of solitary pulmonary nodule     Initially noted on CT abd for his adrenal hemorrhage s/p fall from ladder 2001.  F/u CT chest 04/2000 showed that it had  shrunk from 12mm to 6mm and had no malignant features.  . Adrenal hemorrhage 2001    s/p fall from ladder--a f/u CT abd showed resolution of the hemorrhage- (duodenal polyp incidentally noted;  f/u EGD by Dr. Ewing Schlein 03/2000 showed small lipomatous mass in duodenum which was confirmed by biopsy.  Marland Kitchen BPPV (benign paroxysmal positional vertigo)   . History of acute cystitis 08/2009    with acute prostatitis (enterococcus faecalis)  . Actinic keratoses   . Left bundle branch hemiblock     mentioned in dx of old records 04/15/2009  . Arthralgia of knee, right 2009    exam suspicious for meniscal origin    MEDS:  Outpatient Prescriptions Prior to Visit  Medication Sig Dispense Refill  . alfuzosin (UROXATRAL) 10 MG 24 hr tablet Take 10 mg by mouth daily.      Marland Kitchen aspirin 81 MG tablet Take 81 mg by mouth daily.      Marland Kitchen donepezil (ARICEPT) 10 MG tablet Take 5 mg by mouth at bedtime.       Marland Kitchen galantamine (RAZADYNE ER) 8 MG 24 hr capsule Take 8 mg by mouth 2 (two) times daily.      Marland Kitchen sulfamethoxazole-trimethoprim (BACTRIM DS) 800-160 MG per tablet Take 1 tablet by mouth 2 (two) times daily.  28  tablet  0    PE: Blood pressure 115/75, pulse 62, height 5' 6.5" (1.689 m), weight 147 lb (66.679 kg). Gen: Alert, well appearing.  Patient is oriented to person, place, time, and situation. Up on exam table ok. Mild TTP over focal area right on the right SI joint at it's beginning.  Pain with ROM of right leg, specifically with FABER maneuver--this hurts him in the right SI joint area only.  No pain in hip with hip ROM, no hip tenderness.    IMPRESSION AND PLAN:  Sacroiliac joint pain Discussed dx. Discussed stretches, heat, massage, naprelan 500 mg bid x 4d. If not significantly improved in 1 week, will refer for PT.     FOLLOW UP: prn

## 2012-02-22 DIAGNOSIS — M9981 Other biomechanical lesions of cervical region: Secondary | ICD-10-CM | POA: Diagnosis not present

## 2012-02-22 DIAGNOSIS — M5030 Other cervical disc degeneration, unspecified cervical region: Secondary | ICD-10-CM | POA: Diagnosis not present

## 2012-02-22 DIAGNOSIS — M999 Biomechanical lesion, unspecified: Secondary | ICD-10-CM | POA: Diagnosis not present

## 2012-02-22 DIAGNOSIS — IMO0002 Reserved for concepts with insufficient information to code with codable children: Secondary | ICD-10-CM | POA: Diagnosis not present

## 2012-02-24 DIAGNOSIS — M9981 Other biomechanical lesions of cervical region: Secondary | ICD-10-CM | POA: Diagnosis not present

## 2012-02-24 DIAGNOSIS — M5030 Other cervical disc degeneration, unspecified cervical region: Secondary | ICD-10-CM | POA: Diagnosis not present

## 2012-02-24 DIAGNOSIS — M999 Biomechanical lesion, unspecified: Secondary | ICD-10-CM | POA: Diagnosis not present

## 2012-02-24 DIAGNOSIS — IMO0002 Reserved for concepts with insufficient information to code with codable children: Secondary | ICD-10-CM | POA: Diagnosis not present

## 2012-02-29 DIAGNOSIS — M9981 Other biomechanical lesions of cervical region: Secondary | ICD-10-CM | POA: Diagnosis not present

## 2012-02-29 DIAGNOSIS — M999 Biomechanical lesion, unspecified: Secondary | ICD-10-CM | POA: Diagnosis not present

## 2012-02-29 DIAGNOSIS — M5030 Other cervical disc degeneration, unspecified cervical region: Secondary | ICD-10-CM | POA: Diagnosis not present

## 2012-02-29 DIAGNOSIS — IMO0002 Reserved for concepts with insufficient information to code with codable children: Secondary | ICD-10-CM | POA: Diagnosis not present

## 2012-03-02 DIAGNOSIS — R413 Other amnesia: Secondary | ICD-10-CM | POA: Diagnosis not present

## 2012-03-02 DIAGNOSIS — IMO0002 Reserved for concepts with insufficient information to code with codable children: Secondary | ICD-10-CM | POA: Diagnosis not present

## 2012-03-02 DIAGNOSIS — M9981 Other biomechanical lesions of cervical region: Secondary | ICD-10-CM | POA: Diagnosis not present

## 2012-03-02 DIAGNOSIS — M999 Biomechanical lesion, unspecified: Secondary | ICD-10-CM | POA: Diagnosis not present

## 2012-03-02 DIAGNOSIS — M5030 Other cervical disc degeneration, unspecified cervical region: Secondary | ICD-10-CM | POA: Diagnosis not present

## 2012-03-06 ENCOUNTER — Encounter: Payer: Self-pay | Admitting: Family Medicine

## 2012-03-06 DIAGNOSIS — IMO0002 Reserved for concepts with insufficient information to code with codable children: Secondary | ICD-10-CM | POA: Diagnosis not present

## 2012-03-06 DIAGNOSIS — M9981 Other biomechanical lesions of cervical region: Secondary | ICD-10-CM | POA: Diagnosis not present

## 2012-03-06 DIAGNOSIS — M5030 Other cervical disc degeneration, unspecified cervical region: Secondary | ICD-10-CM | POA: Diagnosis not present

## 2012-03-06 DIAGNOSIS — M999 Biomechanical lesion, unspecified: Secondary | ICD-10-CM | POA: Diagnosis not present

## 2012-03-08 DIAGNOSIS — M9981 Other biomechanical lesions of cervical region: Secondary | ICD-10-CM | POA: Diagnosis not present

## 2012-03-08 DIAGNOSIS — M5030 Other cervical disc degeneration, unspecified cervical region: Secondary | ICD-10-CM | POA: Diagnosis not present

## 2012-03-08 DIAGNOSIS — M999 Biomechanical lesion, unspecified: Secondary | ICD-10-CM | POA: Diagnosis not present

## 2012-03-08 DIAGNOSIS — IMO0002 Reserved for concepts with insufficient information to code with codable children: Secondary | ICD-10-CM | POA: Diagnosis not present

## 2012-03-10 DIAGNOSIS — IMO0002 Reserved for concepts with insufficient information to code with codable children: Secondary | ICD-10-CM | POA: Diagnosis not present

## 2012-03-10 DIAGNOSIS — M9981 Other biomechanical lesions of cervical region: Secondary | ICD-10-CM | POA: Diagnosis not present

## 2012-03-10 DIAGNOSIS — M999 Biomechanical lesion, unspecified: Secondary | ICD-10-CM | POA: Diagnosis not present

## 2012-03-10 DIAGNOSIS — M5030 Other cervical disc degeneration, unspecified cervical region: Secondary | ICD-10-CM | POA: Diagnosis not present

## 2012-03-14 DIAGNOSIS — M999 Biomechanical lesion, unspecified: Secondary | ICD-10-CM | POA: Diagnosis not present

## 2012-03-14 DIAGNOSIS — M5030 Other cervical disc degeneration, unspecified cervical region: Secondary | ICD-10-CM | POA: Diagnosis not present

## 2012-03-14 DIAGNOSIS — M9981 Other biomechanical lesions of cervical region: Secondary | ICD-10-CM | POA: Diagnosis not present

## 2012-03-14 DIAGNOSIS — IMO0002 Reserved for concepts with insufficient information to code with codable children: Secondary | ICD-10-CM | POA: Diagnosis not present

## 2012-03-16 DIAGNOSIS — M999 Biomechanical lesion, unspecified: Secondary | ICD-10-CM | POA: Diagnosis not present

## 2012-03-16 DIAGNOSIS — M9981 Other biomechanical lesions of cervical region: Secondary | ICD-10-CM | POA: Diagnosis not present

## 2012-03-16 DIAGNOSIS — IMO0002 Reserved for concepts with insufficient information to code with codable children: Secondary | ICD-10-CM | POA: Diagnosis not present

## 2012-03-16 DIAGNOSIS — M5030 Other cervical disc degeneration, unspecified cervical region: Secondary | ICD-10-CM | POA: Diagnosis not present

## 2012-03-20 DIAGNOSIS — IMO0002 Reserved for concepts with insufficient information to code with codable children: Secondary | ICD-10-CM | POA: Diagnosis not present

## 2012-03-20 DIAGNOSIS — M999 Biomechanical lesion, unspecified: Secondary | ICD-10-CM | POA: Diagnosis not present

## 2012-03-20 DIAGNOSIS — M5030 Other cervical disc degeneration, unspecified cervical region: Secondary | ICD-10-CM | POA: Diagnosis not present

## 2012-03-20 DIAGNOSIS — M9981 Other biomechanical lesions of cervical region: Secondary | ICD-10-CM | POA: Diagnosis not present

## 2012-03-27 DIAGNOSIS — M9981 Other biomechanical lesions of cervical region: Secondary | ICD-10-CM | POA: Diagnosis not present

## 2012-03-27 DIAGNOSIS — M999 Biomechanical lesion, unspecified: Secondary | ICD-10-CM | POA: Diagnosis not present

## 2012-03-27 DIAGNOSIS — IMO0002 Reserved for concepts with insufficient information to code with codable children: Secondary | ICD-10-CM | POA: Diagnosis not present

## 2012-03-27 DIAGNOSIS — M5030 Other cervical disc degeneration, unspecified cervical region: Secondary | ICD-10-CM | POA: Diagnosis not present

## 2012-04-03 DIAGNOSIS — M9981 Other biomechanical lesions of cervical region: Secondary | ICD-10-CM | POA: Diagnosis not present

## 2012-04-03 DIAGNOSIS — IMO0002 Reserved for concepts with insufficient information to code with codable children: Secondary | ICD-10-CM | POA: Diagnosis not present

## 2012-04-03 DIAGNOSIS — M5030 Other cervical disc degeneration, unspecified cervical region: Secondary | ICD-10-CM | POA: Diagnosis not present

## 2012-04-03 DIAGNOSIS — M999 Biomechanical lesion, unspecified: Secondary | ICD-10-CM | POA: Diagnosis not present

## 2012-04-10 DIAGNOSIS — M999 Biomechanical lesion, unspecified: Secondary | ICD-10-CM | POA: Diagnosis not present

## 2012-04-10 DIAGNOSIS — M9981 Other biomechanical lesions of cervical region: Secondary | ICD-10-CM | POA: Diagnosis not present

## 2012-04-10 DIAGNOSIS — M5030 Other cervical disc degeneration, unspecified cervical region: Secondary | ICD-10-CM | POA: Diagnosis not present

## 2012-04-10 DIAGNOSIS — IMO0002 Reserved for concepts with insufficient information to code with codable children: Secondary | ICD-10-CM | POA: Diagnosis not present

## 2012-04-24 DIAGNOSIS — IMO0002 Reserved for concepts with insufficient information to code with codable children: Secondary | ICD-10-CM | POA: Diagnosis not present

## 2012-04-24 DIAGNOSIS — M9981 Other biomechanical lesions of cervical region: Secondary | ICD-10-CM | POA: Diagnosis not present

## 2012-04-24 DIAGNOSIS — M999 Biomechanical lesion, unspecified: Secondary | ICD-10-CM | POA: Diagnosis not present

## 2012-04-24 DIAGNOSIS — M5030 Other cervical disc degeneration, unspecified cervical region: Secondary | ICD-10-CM | POA: Diagnosis not present

## 2012-05-02 DIAGNOSIS — R413 Other amnesia: Secondary | ICD-10-CM | POA: Diagnosis not present

## 2012-06-20 DIAGNOSIS — C44621 Squamous cell carcinoma of skin of unspecified upper limb, including shoulder: Secondary | ICD-10-CM | POA: Diagnosis not present

## 2012-06-20 DIAGNOSIS — Z85828 Personal history of other malignant neoplasm of skin: Secondary | ICD-10-CM | POA: Diagnosis not present

## 2012-06-29 DIAGNOSIS — Z85828 Personal history of other malignant neoplasm of skin: Secondary | ICD-10-CM | POA: Diagnosis not present

## 2012-07-05 ENCOUNTER — Encounter: Payer: Self-pay | Admitting: Family Medicine

## 2012-07-05 ENCOUNTER — Ambulatory Visit (INDEPENDENT_AMBULATORY_CARE_PROVIDER_SITE_OTHER): Payer: Medicare Other | Admitting: Family Medicine

## 2012-07-05 VITALS — BP 112/70 | HR 68 | Ht 66.5 in | Wt 151.0 lb

## 2012-07-05 DIAGNOSIS — R51 Headache: Secondary | ICD-10-CM

## 2012-07-05 DIAGNOSIS — N400 Enlarged prostate without lower urinary tract symptoms: Secondary | ICD-10-CM | POA: Diagnosis not present

## 2012-07-05 DIAGNOSIS — M899 Disorder of bone, unspecified: Secondary | ICD-10-CM

## 2012-07-05 DIAGNOSIS — M949 Disorder of cartilage, unspecified: Secondary | ICD-10-CM | POA: Diagnosis not present

## 2012-07-05 DIAGNOSIS — R29890 Loss of height: Secondary | ICD-10-CM | POA: Diagnosis not present

## 2012-07-05 DIAGNOSIS — M8580 Other specified disorders of bone density and structure, unspecified site: Secondary | ICD-10-CM

## 2012-07-05 DIAGNOSIS — R519 Headache, unspecified: Secondary | ICD-10-CM

## 2012-07-05 DIAGNOSIS — F0390 Unspecified dementia without behavioral disturbance: Secondary | ICD-10-CM

## 2012-07-05 MED ORDER — DONEPEZIL HCL 10 MG PO TABS: 10.0000 mg | ORAL_TABLET | Freq: Every evening | ORAL | Status: DC | PRN

## 2012-07-05 MED ORDER — ALFUZOSIN HCL ER 10 MG PO TB24: 10.0000 mg | ORAL_TABLET | Freq: Every day | ORAL | Status: DC

## 2012-07-05 NOTE — Progress Notes (Signed)
OFFICE NOTE  07/05/2012  CC:  Chief Complaint  Patient presents with  . Follow-up    dementia, BPH; wife is in room with patient     HPI: Patient is a 79 y.o. Caucasian male who is here for 60mo f/u for dementia, occipital/cervical HA disorder, and history of BPH with obstructive voiding sx's.   Says he is doing ok.  He says the addition of namenda to his uroxatral has caused a mild amount of lightheadedness that is bothersome/nagging.  It is not to the level of feeling like he is going to fall.  Uroxitral did not cause this effect by itself. He had no similar effect when on aricept, he and wife say they are feeling like the switch to namenda was made a bit prematurely or without sufficient reason.   Says he is not taking any pain medication.  He is not having any signif pain now.   Regarding memory and cognition, his wife says he forgets more.  Describes things like forgets where things are, easily frustrated/overwhelmed when he forgets something.   He can dress and bathe himself w/out problem, says he can drive to familiar places but admits he finds this harder to do and takes much longer to get to places than he used to.  He still does his own finances but he is beginning to make a few mistakes (payed a bill twice last month).  He admits he does not want to lose control of these types of things b/c he'll feel worthless.  Pertinent PMH:  Past Medical History  Diagnosis Date  . Hearing deficit     Bilateral; noise damage in [employer].  Has had hearing aids since about 2008.  . Migraine headache with aura onset in teens    No migraines since about 2004  . BPH (benign prostatic hypertrophy)     History of acute urinary retention (2006).  Hx of elevated PSA (7.26) + right sided prostate nodule on DRE by urologist.  PSA did not go down with tx with antibiotics--biopsy was done and showed NO MALIGNANCY but some chronic inflammation was present--turned out related to acute cystitis (enterococcus  UTI).  PSA was 1.17 in 07/2011 at urologist's (Dr. Vernie Ammons).  . Headache in back of head     Neurologist is treating him with PT for this problem and it helps  . Mild cognitive impairment     Vs early/mild alzheimer's vs CNS degenerative disorder:  Dr. Clarisse Gouge; started aricept around 08/2011.  MRI 2013 showed extensive white matter disease; no acute problems.  Pt switched neurologic care to [facility] 11/2011.  Marland Kitchen History of TIA (transient ischemic attack) @ 2008    02/2001 and 01/2006 carotid dopplers showed 50% or less stenosis.  . DDD (degenerative disc disease), cervical     C5-6, C6-7 primarily  . History of solitary pulmonary nodule     Initially noted on CT abd for his adrenal hemorrhage s/p fall from ladder 2001.  F/u CT chest 04/2000 showed that it had shrunk from 12mm to 6mm and had no malignant features.  . Adrenal hemorrhage 2001    s/p fall from ladder--a f/u CT abd showed resolution of the hemorrhage- (duodenal polyp incidentally noted;  f/u EGD by Dr. Ewing Schlein 03/2000 showed small lipomatous mass in duodenum which was confirmed by biopsy.  Marland Kitchen BPPV (benign paroxysmal positional vertigo)   . History of acute cystitis 08/2009    with acute prostatitis (enterococcus faecalis)  . Actinic keratoses   . Left  bundle branch hemiblock     mentioned in dx of old records 04/15/2009  . Arthralgia of knee, right 2009    exam suspicious for meniscal origin   Past Surgical History  Procedure Date  . Prostate biopsy 04/07/2005    For elevated PSA + right sided prostate nodule on DRE by urologist:  benign biopsy.  . Tonsillectomy preteen  . Dental     iimplants  . Cataract extrac    Past family and social history reviewed and there are no changes since the patient's last office visit with me.  MEDS:  Outpatient Prescriptions Prior to Visit  Medication Sig Dispense Refill  . aspirin 81 MG tablet Take 81 mg by mouth daily.      . Memantine HCl ER (NAMENDA XR) 28 MG CP24 Take 1 tablet by  mouth daily. As per Dr. Terrace Arabia, his neurologist      . [DISCONTINUED] alfuzosin (UROXATRAL) 10 MG 24 hr tablet Take 10 mg by mouth daily.      . [DISCONTINUED] galantamine (RAZADYNE) 4 MG tablet Take 4 mg by mouth daily.       Last reviewed on 07/05/2012  8:38 AM by Jeoffrey Massed, MD  PE: Blood pressure 112/70, pulse 68, height 5' 6.5" (1.689 m), weight 151 lb (68.493 kg). Gen: Alert, well appearing.  Patient is oriented to person, place, and situation. AFFECT: pleasant, lucid thought and speech. MMSE today: recall 1/3  Date is "the 6th or 7th" and all other date info was correct. All other questions answered fine, tasks completed successfully. CV: RRR, no m/r/g.   LUNGS: CTA bilat, nonlabored resps, good aeration in all lung fields. EXT: no clubbing, cyanosis, or edema.   IMPRESSION AND PLAN:  Dementia Gradually deteriorating but still pretty functional. He prefers to d/c namenda and go back on aricept (MAY stop namenda today, start aricept 10mg  qd tonight). Will call his neurologist, Dr. Terrace Arabia, and discuss this change with her. Hopefully his dizziness periods will resolve and his memory problems will not be any worse from this change.  BPH (benign prostatic hypertrophy) Doing fine on uroxatrol.  Headache disorder Cervicalgia/occipital HA disorder: stable lately.  No pain meds at this time.   At the very end of the visit today his wife brought up concern that he has lost a few inches in height and she wonders about osteoporosis.  I said I would order a bone density test to further evaluate this.  An After Visit Summary was printed and given to the patient.  FOLLOW UP: 72mo to evaluate after change in med today.  Hopefully we can get around to offering zostavax and Tdap at that time.

## 2012-07-06 NOTE — Assessment & Plan Note (Signed)
Cervicalgia/occipital HA disorder: stable lately.  No pain meds at this time.

## 2012-07-06 NOTE — Assessment & Plan Note (Addendum)
Gradually deteriorating but still pretty functional. He prefers to d/c namenda and go back on aricept (MAY stop namenda today, start aricept 10mg  qd tonight). Will call his neurologist, Dr. Terrace Arabia, and discuss this change with her. Hopefully his dizziness periods will resolve and his memory problems will not be any worse from this change.

## 2012-07-06 NOTE — Assessment & Plan Note (Signed)
Doing fine on uroxatrol.

## 2012-07-15 ENCOUNTER — Other Ambulatory Visit: Payer: Self-pay

## 2012-07-17 ENCOUNTER — Encounter: Payer: Self-pay | Admitting: Neurology

## 2012-07-17 ENCOUNTER — Ambulatory Visit (INDEPENDENT_AMBULATORY_CARE_PROVIDER_SITE_OTHER)
Admission: RE | Admit: 2012-07-17 | Discharge: 2012-07-17 | Disposition: A | Discharge status: Home / Self Care | Payer: Medicare Other | Source: Ambulatory Visit | Attending: Family Medicine | Admitting: Family Medicine

## 2012-07-17 DIAGNOSIS — R29890 Loss of height: Secondary | ICD-10-CM

## 2012-07-17 DIAGNOSIS — M899 Disorder of bone, unspecified: Secondary | ICD-10-CM

## 2012-07-17 DIAGNOSIS — M8580 Other specified disorders of bone density and structure, unspecified site: Secondary | ICD-10-CM

## 2012-07-25 DIAGNOSIS — R413 Other amnesia: Secondary | ICD-10-CM | POA: Diagnosis not present

## 2012-07-28 ENCOUNTER — Telehealth: Payer: Self-pay | Admitting: Family Medicine

## 2012-07-28 NOTE — Telephone Encounter (Signed)
Spoke to wife.  All questions answered.  Copy of result mailed to patient.

## 2012-07-28 NOTE — Telephone Encounter (Signed)
Patient's wife is concerned why patient is getting shorter, she is asking if there are any treatment options for her husband especially since his bone density test results came back normal. Please mail a hard copy of the bone density test results to patient for their records.

## 2012-08-02 ENCOUNTER — Ambulatory Visit (INDEPENDENT_AMBULATORY_CARE_PROVIDER_SITE_OTHER): Payer: Medicare Other | Admitting: Family Medicine

## 2012-08-02 ENCOUNTER — Encounter: Payer: Self-pay | Admitting: Family Medicine

## 2012-08-02 VITALS — BP 130/80 | HR 64 | Ht 67.5 in | Wt 149.0 lb

## 2012-08-02 DIAGNOSIS — F0390 Unspecified dementia without behavioral disturbance: Secondary | ICD-10-CM

## 2012-08-02 DIAGNOSIS — N400 Enlarged prostate without lower urinary tract symptoms: Secondary | ICD-10-CM

## 2012-08-02 MED ORDER — FINASTERIDE 5 MG PO TABS: 5.0000 mg | ORAL_TABLET | Freq: Every day | ORAL | Status: DC

## 2012-08-02 NOTE — Progress Notes (Signed)
OFFICE NOTE  08/02/2012  CC:  Chief Complaint  Patient presents with  . Follow-up    dementia, loss of height     HPI: Patient is a 79 y.o. Caucasian male who is here for 1 mo f/u dementia, changed back to aricept last month at his request. We also got a bone density test and this was normal---wife concerned about his loss of height.   His cognitive function is not significantly changed but has side effect of vivid dreams, wife concerned that it is affecting his quality of sleep.   He gets up 2-3 times per night to urinate.  Has urinary frequency during daytime as well.  Some mild problem getting urine stream started.  He dribbles at the end, has incomplete emptying.  He has been on uroxitrol for about 10 yrs and he can't be clear on how much it is helping him.      Pertinent PMH:  Past Medical History  Diagnosis Date  . Hearing deficit     Bilateral; noise damage in [employer].  Has had hearing aids since about 2008.  . Migraine headache with aura onset in teens    No migraines since about 2004  . BPH (benign prostatic hypertrophy)     History of acute urinary retention (2006).  Hx of elevated PSA (7.26) + right sided prostate nodule on DRE by urologist.  PSA did not go down with tx with antibiotics--biopsy was done and showed NO MALIGNANCY but some chronic inflammation was present--turned out related to acute cystitis (enterococcus UTI).  PSA was 1.17 in 07/2011 at urologist's (Dr. Vernie Ammons).  . Headache in back of head     Neurologist is treating him with PT for this problem and it helps  . Mild cognitive impairment     Vs early/mild alzheimer's vs CNS degenerative disorder:  Dr. Clarisse Gouge; started aricept around 08/2011.  MRI 2013 showed extensive white matter disease; no acute problems.  Pt switched neurologic care to [facility] 11/2011.  Marland Kitchen History of TIA (transient ischemic attack) @ 2008    02/2001 and 01/2006 carotid dopplers showed 50% or less stenosis.  . DDD (degenerative  disc disease), cervical     C5-6, C6-7 primarily  . History of solitary pulmonary nodule     Initially noted on CT abd for his adrenal hemorrhage s/p fall from ladder 2001.  F/u CT chest 04/2000 showed that it had shrunk from 12mm to 6mm and had no malignant features.  . Adrenal hemorrhage 2001    s/p fall from ladder--a f/u CT abd showed resolution of the hemorrhage- (duodenal polyp incidentally noted;  f/u EGD by Dr. Ewing Schlein 03/2000 showed small lipomatous mass in duodenum which was confirmed by biopsy.  Marland Kitchen BPPV (benign paroxysmal positional vertigo)   . History of acute cystitis 08/2009    with acute prostatitis (enterococcus faecalis)  . Actinic keratoses   . Left bundle branch hemiblock     mentioned in dx of old records 04/15/2009  . Arthralgia of knee, right 2009    exam suspicious for meniscal origin    MEDS:  Outpatient Prescriptions Prior to Visit  Medication Sig Dispense Refill  . alfuzosin (UROXATRAL) 10 MG 24 hr tablet Take 1 tablet (10 mg total) by mouth daily.  90 tablet  1  . donepezil (ARICEPT) 10 MG tablet Take 1 tablet (10 mg total) by mouth at bedtime as needed.  90 tablet  3  . aspirin 81 MG tablet Take 81 mg by mouth daily.      Marland Kitchen  Memantine HCl ER (NAMENDA XR) 28 MG CP24 Take by mouth.       No facility-administered medications prior to visit.    PE: Blood pressure 130/80, pulse 64, height 5' 7.5" (1.715 m), weight 149 lb (67.586 kg). Gen: Alert, well appearing.  Patient is oriented to person, place, time, and situation. AFFECT: pleasant, lucid thought and speech.  He stammers to find the right word or recall a certain thing fairly often. No further exam today.  Recent labs:   Lab Results  Component Value Date   PSA 1.30 01/05/2012    IMPRESSION AND PLAN:  BPH (benign prostatic hypertrophy) Continue uroxatral and add proscar 5mg  qd.  Therapeutic expectations and side effect profile of medication discussed today.  Patient's questions  answered.   Dementia No significant change in cognition/memory on aricept x 71mo. He has side effect of vivid dreams but is tolerating this per his report.   Reassured pt/wife that his diminished height measurement is a result of increased kyphosis with aging (from spinal ligamental laxity/chronic poor posture---not from osteoporotic fx or from loss of vertebral bone height).  Spent 30 min with pt today, with >50% of this time spent in counseling and care coordination regarding the above problems.  An After Visit Summary was printed and given to the patient.   FOLLOW UP:  6 wks

## 2012-08-03 NOTE — Assessment & Plan Note (Signed)
No significant change in cognition/memory on aricept x 29mo. He has side effect of vivid dreams but is tolerating this per his report.

## 2012-08-03 NOTE — Assessment & Plan Note (Signed)
Continue uroxatral and add proscar 5mg  qd.  Therapeutic expectations and side effect profile of medication discussed today.  Patient's questions answered.

## 2012-08-15 ENCOUNTER — Encounter: Payer: Self-pay | Admitting: Family Medicine

## 2012-08-15 ENCOUNTER — Ambulatory Visit (INDEPENDENT_AMBULATORY_CARE_PROVIDER_SITE_OTHER): Payer: Medicare Other | Admitting: Family Medicine

## 2012-08-15 VITALS — BP 139/84 | HR 59 | Temp 97.5°F | Resp 14 | Wt 148.5 lb

## 2012-08-15 DIAGNOSIS — S139XXA Sprain of joints and ligaments of unspecified parts of neck, initial encounter: Secondary | ICD-10-CM

## 2012-08-15 DIAGNOSIS — S161XXA Strain of muscle, fascia and tendon at neck level, initial encounter: Secondary | ICD-10-CM | POA: Insufficient documentation

## 2012-08-15 NOTE — Assessment & Plan Note (Signed)
Discussed heat, gentle ROM, avoid vigorous exercise for 1 wk. Dermatran anti-inflamm compounded cream rx'd today: (baclofen, doxepin, gabapentin, meloxicam, topirimate, and pentoxifylline). Apply 2g qid to affected area.

## 2012-08-15 NOTE — Progress Notes (Signed)
OFFICE NOTE  08/15/2012  CC:  Chief Complaint  Patient presents with  . Neck Pain    Pt c/o left side neck pain that radiates down from ear intermittently, worse at night.     HPI: Patient is a 79 y.o. Caucasian male who is here for cold/ST, "not feeling well". Basically, has 48h hx of left sided muscular neck pain that comes and goes depending on neck position.   It is reproduced by turning Hot pad helps.  No sx's of URI like nasal congestion, runny nose, cough, or fever.  He has exacerbation of the pain in his lateral neck when he swallows, which is what made him feel like maybe he had a cold coming on initially. No malaise, no other aches or pains.  ROS: left sided neck, shoulder muscles posteriorly are sensitive/tender when wife massages this region. He now recalls a strain when adjusting his rain barrel a few days before onset of his pain.  Pertinent PMH:  Past Medical History  Diagnosis Date  . Hearing deficit     Bilateral; noise damage in [employer].  Has had hearing aids since about 2008.  . Migraine headache with aura onset in teens    No migraines since about 2004  . BPH (benign prostatic hypertrophy)     History of acute urinary retention (2006).  Hx of elevated PSA (7.26) + right sided prostate nodule on DRE by urologist.  PSA did not go down with tx with antibiotics--biopsy was done and showed NO MALIGNANCY but some chronic inflammation was present--turned out related to acute cystitis (enterococcus UTI).  PSA was 1.17 in 07/2011 at urologist's (Dr. Vernie Ammons).  . Headache in back of head     Neurologist is treating him with PT for this problem and it helps  . Mild cognitive impairment     Vs early/mild alzheimer's vs CNS degenerative disorder:  Dr. Clarisse Gouge; started aricept around 08/2011.  MRI 2013 showed extensive white matter disease; no acute problems.  Pt switched neurologic care to [facility] 11/2011.  Marland Kitchen History of TIA (transient ischemic attack) @ 2008    02/2001  and 01/2006 carotid dopplers showed 50% or less stenosis.  . DDD (degenerative disc disease), cervical     C5-6, C6-7 primarily  . History of solitary pulmonary nodule     Initially noted on CT abd for his adrenal hemorrhage s/p fall from ladder 2001.  F/u CT chest 04/2000 showed that it had shrunk from 12mm to 6mm and had no malignant features.  . Adrenal hemorrhage 2001    s/p fall from ladder--a f/u CT abd showed resolution of the hemorrhage- (duodenal polyp incidentally noted;  f/u EGD by Dr. Ewing Schlein 03/2000 showed small lipomatous mass in duodenum which was confirmed by biopsy.  Marland Kitchen BPPV (benign paroxysmal positional vertigo)   . History of acute cystitis 08/2009    with acute prostatitis (enterococcus faecalis)  . Actinic keratoses   . Left bundle branch hemiblock     mentioned in dx of old records 04/15/2009  . Arthralgia of knee, right 2009    exam suspicious for meniscal origin    MEDS:  Outpatient Prescriptions Prior to Visit  Medication Sig Dispense Refill  . alfuzosin (UROXATRAL) 10 MG 24 hr tablet Take 1 tablet (10 mg total) by mouth daily.  90 tablet  1  . aspirin 325 MG EC tablet Take 325 mg by mouth daily.      Marland Kitchen donepezil (ARICEPT) 10 MG tablet Take 1 tablet (10 mg  total) by mouth at bedtime as needed.  90 tablet  3  . finasteride (PROSCAR) 5 MG tablet Take 1 tablet (5 mg total) by mouth daily.  30 tablet  2   No facility-administered medications prior to visit.    PE: Blood pressure 139/84, pulse 59, temperature 97.5 F (36.4 C), temperature source Oral, resp. rate 14, weight 148 lb 8 oz (67.359 kg), SpO2 96.00%. Gen: Alert, well appearing.  Patient is oriented to person, place, time, and situation. CN 2-12 intact grossly. ROM of neck intact with just a bit of stiffness with rotation and lateral bending, bringing on a slight amount of discomfort in left sided neck mm's.  Mild TTP in left SCM mm, extending into left posterior cervical muscles, and left trapezius---there is  almost a trigger point in trapezius but not quite.  No rash.  No bondy tenderness.  NO cervical or supraclavicular LAD.   UE strength 5/5 prox and dist.  Trace reflexes in biceps and triceps bilat.  IMPRESSION AND PLAN:  Cervical myofascial strain Discussed heat, gentle ROM, avoid vigorous exercise for 1 wk. Dermatran anti-inflamm compounded cream rx'd today: (baclofen, doxepin, gabapentin, meloxicam, topirimate, and pentoxifylline). Apply 2g qid to affected area.    FOLLOW UP: keep f/u already scheduled for next month

## 2012-08-23 ENCOUNTER — Encounter: Payer: Self-pay | Admitting: Family Medicine

## 2012-08-31 ENCOUNTER — Ambulatory Visit: Payer: Self-pay | Admitting: Neurology

## 2012-09-12 ENCOUNTER — Ambulatory Visit (INDEPENDENT_AMBULATORY_CARE_PROVIDER_SITE_OTHER): Payer: Medicare Other | Admitting: Family Medicine

## 2012-09-12 ENCOUNTER — Encounter: Payer: Self-pay | Admitting: Family Medicine

## 2012-09-12 VITALS — BP 124/82 | HR 68 | Ht 67.5 in | Wt 150.0 lb

## 2012-09-12 DIAGNOSIS — Z5189 Encounter for other specified aftercare: Secondary | ICD-10-CM

## 2012-09-12 DIAGNOSIS — S161XXD Strain of muscle, fascia and tendon at neck level, subsequent encounter: Secondary | ICD-10-CM

## 2012-09-12 DIAGNOSIS — N400 Enlarged prostate without lower urinary tract symptoms: Secondary | ICD-10-CM

## 2012-09-12 DIAGNOSIS — F0390 Unspecified dementia without behavioral disturbance: Secondary | ICD-10-CM

## 2012-09-12 MED ORDER — MEMANTINE HCL 28 X 5 MG & 21 X 10 MG PO TABS: ORAL_TABLET | ORAL | Status: DC

## 2012-09-12 NOTE — Assessment & Plan Note (Signed)
Resolved

## 2012-09-12 NOTE — Progress Notes (Signed)
OFFICE NOTE  09/12/2012  CC:  Chief Complaint  Patient presents with  . Follow-up    BPH     HPI: Patient is a 79 y.o. Caucasian male who is here with his wife for 6 wk f/u:   BPH, started proscar last visit.  Says proscar caused nausea and fatigue after a few doses so he stopped it.  His sx's resolved.  He is able to tolerate his voiding dysfunction for now but is interested in another med if possible in the future.  Dermatran compound took a while to get to him so by the time it got there his neck felt better already.  Aricept still causes vivid dreams and he's not convinced that it has helped his memory.  He is interested in trying a med to combine with the aricept. Reviewed past trial of namenda--? Possible dizziness and HA.  This was a trial back in February 2014 when he was not on aricept anymore. They are willing to retry it as an add-on to aricept---since they still feel like he has significant short term memory probs and cognitive dysfunction that needs improving.     Pertinent PMH:  Past Medical History  Diagnosis Date  . Hearing deficit     Bilateral; noise damage in [employer].  Has had hearing aids since about 2008.  . Migraine headache with aura onset in teens    No migraines since about 2004  . BPH (benign prostatic hypertrophy)     History of acute urinary retention (2006)  Hx of elevated PSA (7.26) + right sided prostate nodule on DRE by urologist.  PSA did not go down with tx with antibiotics--biopsy was done and showed NO MALIGNANCY but some chronic inflammation was present--turned out related to acute cystitis (enterococcus UTI).  PSA was 1.17 in 07/2011 at urologist's (Dr. Vernie Ammons).  F/u at urol is now prn as of 07/2012.  Marland Kitchen Headache in back of head     Neurologist is treating him with PT for this problem and it helps  . Mild cognitive impairment     Vs early/mild alzheimer's vs CNS degenerative disorder:  Dr. Clarisse Gouge; started aricept around 08/2011.  MRI 2013 showed  extensive white matter disease; no acute problems.  Pt switched neurologic care to [facility] 11/2011.  Marland Kitchen History of TIA (transient ischemic attack) @ 2008    02/2001 and 01/2006 carotid dopplers showed 50% or less stenosis.  . DDD (degenerative disc disease), cervical     C5-6, C6-7 primarily  . History of solitary pulmonary nodule     Initially noted on CT abd for his adrenal hemorrhage s/p fall from ladder 2001.  F/u CT chest 04/2000 showed that it had shrunk from 12mm to 6mm and had no malignant features.  . Adrenal hemorrhage 2001    s/p fall from ladder--a f/u CT abd showed resolution of the hemorrhage- (duodenal polyp incidentally noted;  f/u EGD by Dr. Ewing Schlein 03/2000 showed small lipomatous mass in duodenum which was confirmed by biopsy.  Marland Kitchen BPPV (benign paroxysmal positional vertigo)   . History of acute cystitis 08/2009    with acute prostatitis (enterococcus faecalis)  . Actinic keratoses   . Left bundle branch hemiblock     mentioned in dx of old records 04/15/2009  . Arthralgia of knee, right 2009    exam suspicious for meniscal origin    MEDS:  Outpatient Prescriptions Prior to Visit  Medication Sig Dispense Refill  . alfuzosin (UROXATRAL) 10 MG 24 hr tablet Take  1 tablet (10 mg total) by mouth daily.  90 tablet  1  . aspirin 325 MG EC tablet Take 325 mg by mouth daily.      Marland Kitchen donepezil (ARICEPT) 10 MG tablet Take 1 tablet (10 mg total) by mouth at bedtime as needed.  90 tablet  3  . finasteride (PROSCAR) 5 MG tablet Take 1 tablet (5 mg total) by mouth daily.  30 tablet  2   No facility-administered medications prior to visit.    PE: Blood pressure 124/82, pulse 68, height 5' 7.5" (1.715 m), weight 150 lb (68.04 kg). Gen: Alert, well appearing.  Patient is oriented to person, place, time, and situation. AFFECT: pleasant, lucid thought and speech. NECK: nontender, muscles are diffusely tight CV: RRR, no m/r/g.   LUNGS: CTA bilat, nonlabored resps, good aeration in  all lung fields.   IMPRESSION AND PLAN:  Dementia Continue aricept. Add on namenda: they have pills at home from prior trial, plus I gave instructions today: 5mg  qd x 1 wk, 5mg  bid x 1 week, 5mg  + 10mg  x 1 wk, then 10mg  bid. I'll see how he's doing in 3-4 wks.  Cervical myofascial strain Resolved.  BPH (benign prostatic hypertrophy) Continue alfuzosin only right now. Did not tolerate proscar. May try different 5 alpha reductase inhibitor in the future.   An After Visit Summary was printed and given to the patient.   FOLLOW UP: 3-4 wks

## 2012-09-12 NOTE — Assessment & Plan Note (Signed)
Continue aricept. Add on namenda: they have pills at home from prior trial, plus I gave instructions today: 5mg  qd x 1 wk, 5mg  bid x 1 week, 5mg  + 10mg  x 1 wk, then 10mg  bid. I'll see how he's doing in 3-4 wks.

## 2012-09-12 NOTE — Assessment & Plan Note (Signed)
Continue alfuzosin only right now. Did not tolerate proscar. May try different 5 alpha reductase inhibitor in the future.

## 2012-09-13 ENCOUNTER — Ambulatory Visit: Payer: Medicare Other | Admitting: Family Medicine

## 2012-10-10 ENCOUNTER — Ambulatory Visit (INDEPENDENT_AMBULATORY_CARE_PROVIDER_SITE_OTHER): Payer: Medicare Other | Admitting: Family Medicine

## 2012-10-10 ENCOUNTER — Encounter: Payer: Self-pay | Admitting: Family Medicine

## 2012-10-10 VITALS — BP 101/64 | HR 64 | Temp 97.6°F | Resp 14 | Ht 67.5 in | Wt 149.5 lb

## 2012-10-10 DIAGNOSIS — M79609 Pain in unspecified limb: Secondary | ICD-10-CM | POA: Diagnosis not present

## 2012-10-10 DIAGNOSIS — M79673 Pain in unspecified foot: Secondary | ICD-10-CM

## 2012-10-10 DIAGNOSIS — N400 Enlarged prostate without lower urinary tract symptoms: Secondary | ICD-10-CM

## 2012-10-10 DIAGNOSIS — F0390 Unspecified dementia without behavioral disturbance: Secondary | ICD-10-CM

## 2012-10-10 MED ORDER — MEMANTINE HCL 10 MG PO TABS: 10.0000 mg | ORAL_TABLET | Freq: Two times a day (BID) | ORAL | Status: DC

## 2012-10-10 NOTE — Progress Notes (Signed)
OFFICE NOTE  10/10/2012  CC:  Chief Complaint  Patient presents with  . Follow-up    1-mth [Medication Mgt: Namenda]     HPI: Patient is a 79 y.o. Caucasian male who is here for 1 mo f/u dementia med mgmt.uroxatrol.   Has been on aricept and we recently added namenda--he has titrated up to the 10mg  bid dosing and so far feels no different regarding his memory. Wife notes no difference in his cognition, function, or memory.  Regarding his BPH, he says he was on finasteride x 1 mo and felt that it was of no help.  He says he is doing "tolerable" on uroxatrol by itself.  Wife says he complains of pain in his arches frequently, asks if his feet can be examined.  Pertinent PMH:  Past Medical History  Diagnosis Date  . Hearing deficit     Bilateral; noise damage in [employer].  Has had hearing aids since about 2008.  . Migraine headache with aura onset in teens    No migraines since about 2004  . BPH (benign prostatic hypertrophy)     History of acute urinary retention (2006)  Hx of elevated PSA (7.26) + right sided prostate nodule on DRE by urologist.  PSA did not go down with tx with antibiotics--biopsy was done and showed NO MALIGNANCY but some chronic inflammation was present--turned out related to acute cystitis (enterococcus UTI).  PSA was 1.17 in 07/2011 at urologist's (Dr. Vernie Ammons).  F/u at urol is now prn as of 07/2012.  Marland Kitchen Headache in back of head     Neurologist is treating him with PT for this problem and it helps  . Mild cognitive impairment     Vs early/mild alzheimer's vs CNS degenerative disorder:  Dr. Clarisse Gouge; started aricept around 08/2011.  MRI 2013 showed extensive white matter disease; no acute problems.  Pt switched neurologic care to [facility] 11/2011.  Marland Kitchen History of TIA (transient ischemic attack) @ 2008    02/2001 and 01/2006 carotid dopplers showed 50% or less stenosis.  . DDD (degenerative disc disease), cervical     C5-6, C6-7 primarily  . History of solitary  pulmonary nodule     Initially noted on CT abd for his adrenal hemorrhage s/p fall from ladder 2001.  F/u CT chest 04/2000 showed that it had shrunk from 12mm to 6mm and had no malignant features.  . Adrenal hemorrhage 2001    s/p fall from ladder--a f/u CT abd showed resolution of the hemorrhage- (duodenal polyp incidentally noted;  f/u EGD by Dr. Ewing Schlein 03/2000 showed small lipomatous mass in duodenum which was confirmed by biopsy.  Marland Kitchen BPPV (benign paroxysmal positional vertigo)   . History of acute cystitis 08/2009    with acute prostatitis (enterococcus faecalis)  . Actinic keratoses   . Left bundle branch hemiblock     mentioned in dx of old records 04/15/2009  . Arthralgia of knee, right 2009    exam suspicious for meniscal origin    MEDS:  Outpatient Prescriptions Prior to Visit  Medication Sig Dispense Refill  . alfuzosin (UROXATRAL) 10 MG 24 hr tablet Take 1 tablet (10 mg total) by mouth daily.  90 tablet  1  . aspirin 325 MG EC tablet Take 325 mg by mouth daily.      Marland Kitchen donepezil (ARICEPT) 10 MG tablet Take 1 tablet (10 mg total) by mouth at bedtime as needed.  90 tablet  3  . memantine (NAMENDA TITRATION PAK) tablet pack 5 mg/day  for =1 week; 5 mg twice daily for =1 week; 15 mg/day given in 5 mg and 10 mg separated doses for =1 week; then 10 mg twice daily  49 tablet  0   No facility-administered medications prior to visit.    PE: Blood pressure 101/64, pulse 64, temperature 97.6 F (36.4 C), temperature source Oral, resp. rate 14, height 5' 7.5" (1.715 m), weight 149 lb 8 oz (67.813 kg), SpO2 98.00%. Gen: Alert, well appearing.  Patient is oriented to person, place, time, and situation. MMSE: 28/30 (1 out 3 recall). Gen: Alert, well appearing.  Patient is oriented to person, place, time, and situation. Feet: high arches noted bilat.  No arch or plantar surface tenderness.    IMPRESSION AND PLAN:  Dementia Stable. Continue with 3 mo trial of namenda + aricept.  BPH  (benign prostatic hypertrophy) Stable.  No med changes today.  Foot arch pain Arch supports for both feet recommended.   An After Visit Summary was printed and given to the patient.  FOLLOW UP: 8mo

## 2012-10-19 ENCOUNTER — Encounter: Payer: Self-pay | Admitting: Neurology

## 2012-10-19 DIAGNOSIS — M722 Plantar fascial fibromatosis: Secondary | ICD-10-CM | POA: Diagnosis not present

## 2012-10-20 ENCOUNTER — Ambulatory Visit (INDEPENDENT_AMBULATORY_CARE_PROVIDER_SITE_OTHER): Payer: Medicare Other | Admitting: Neurology

## 2012-10-20 VITALS — BP 130/82 | HR 69 | Temp 98.2°F | Ht 67.0 in | Wt 152.0 lb

## 2012-10-20 DIAGNOSIS — R51 Headache: Secondary | ICD-10-CM | POA: Diagnosis not present

## 2012-10-20 DIAGNOSIS — F0390 Unspecified dementia without behavioral disturbance: Secondary | ICD-10-CM | POA: Diagnosis not present

## 2012-10-20 DIAGNOSIS — G3184 Mild cognitive impairment, so stated: Secondary | ICD-10-CM | POA: Diagnosis not present

## 2012-10-20 DIAGNOSIS — R519 Headache, unspecified: Secondary | ICD-10-CM

## 2012-10-20 MED ORDER — DONEPEZIL HCL 10 MG PO TABS: 10.0000 mg | ORAL_TABLET | Freq: Every evening | ORAL | Status: DC | PRN

## 2012-10-20 MED ORDER — MEMANTINE HCL ER 28 MG PO CP24: 28.0000 mg | ORAL_CAPSULE | Freq: Every day | ORAL | Status: DC

## 2012-10-20 MED ORDER — ASPIRIN 325 MG PO TBEC: 325.0000 mg | DELAYED_RELEASE_TABLET | Freq: Every day | ORAL | Status: AC

## 2012-10-20 NOTE — Progress Notes (Signed)
  History of Present Illness  HPI: Mr. 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  MRI of brain review with patient and his wife, there was no acute lesions, there is evidence of mild to moderate atrophy, extensive periventricular white matter disease,  Recent laboratory showed a normal CBC, CMP, TSH,  He also complains few months history of upper nuchal, occipital area headaches,  Ultrasound of carotid arteries showed 50-69% stenosis of right internal carotid artery, he is exercising regularly,   He could not tolerate Namenda xr 28mg  qday, complains of mild drunk, dizziness sensation, no veritgo, no significant GI side effect.  He was started on Donepazil by Dr. Milinda Cave, in Jan 2014, he complains of vivid dreams shortly afterwards, he came in for urgent followup  UPDATE Oct 20 2012: He no longer has vivid dreams any more, he is still taking Donepazil 10mg  qday, also Namenda  10 mg twice a day, he complains of slight drunken feeling, he works out every morning, exercise 3 times each week, lasting one hour, he denies trouble walking,     Physical Exam  Neck: supple no carotid bruits Respiratory: clear to auscultation bilaterally Cardiovascular: regular rate rhythm  Neurologic Exam  Mental Status: pleasant, awake, alert, cooperative to history, talking, and casual conversation. [place] cognitive assessment 21/30, he missed 5/5  recall, not oriented to date, day, mild difficulty with attention  Cranial Nerves: CN II-XII pupils were equal round reactive to light.  Fundi were sharp bilaterally.  Extraocular movements were full.  Visual fields were full on confrontational test.  Facial sensation and strength were normal.  Hearing was intact to finger rubbing bilaterally.  Uvula tongue were midline.  Head turning and shoulder shrugging were normal and symmetric.  Tongue protrusion into the cheeks strength were normal.  Motor: Normal tone, bulk, and strength. Sensory: Normal to light touch, pinprick, proprioception, and decreased vibratory sensation at midshin. Coordination: Normal finger-to-nose, heel-to-shin.  There was no dysmetria noticed. Gait and Station: Narrow based and steady, was able to perform tiptoe, heel, and tandem walking without difficulty.  Romberg sign: Negative Reflexes: Deep tendon reflexes: Biceps: 2/2, Brachioradialis: 2/2, Triceps: 2/2, Pateller: 2/2, Achilles: trace.  Plantar responses are flexor.   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 Mild cognitive impairment, vascular vs CNS degenerative disorder. 2.  Daily asa 325mg  qday. 3.  continue donepazil 10mg  qday, Namenda 10 mg twice a day 4. RTC in 4-6 months

## 2012-10-22 DIAGNOSIS — M79673 Pain in unspecified foot: Secondary | ICD-10-CM | POA: Insufficient documentation

## 2012-10-22 NOTE — Assessment & Plan Note (Signed)
Stable.    - No med changes today

## 2012-10-22 NOTE — Assessment & Plan Note (Signed)
Arch supports for both feet recommended.

## 2012-10-22 NOTE — Assessment & Plan Note (Signed)
Stable. Continue with 3 mo trial of namenda + aricept.

## 2012-11-14 ENCOUNTER — Other Ambulatory Visit: Payer: Self-pay | Admitting: Family Medicine

## 2013-01-10 ENCOUNTER — Ambulatory Visit (INDEPENDENT_AMBULATORY_CARE_PROVIDER_SITE_OTHER): Payer: Medicare Other | Admitting: Family Medicine

## 2013-01-10 ENCOUNTER — Encounter: Payer: Self-pay | Admitting: Family Medicine

## 2013-01-10 VITALS — BP 132/84 | HR 67 | Temp 98.0°F | Resp 16 | Ht 67.5 in | Wt 148.0 lb

## 2013-01-10 DIAGNOSIS — G3184 Mild cognitive impairment, so stated: Secondary | ICD-10-CM

## 2013-01-10 MED ORDER — GALANTAMINE HYDROBROMIDE ER 8 MG PO CP24: 8.0000 mg | ORAL_CAPSULE | Freq: Every day | ORAL | Status: DC

## 2013-01-10 MED ORDER — MEMANTINE HCL ER 14 MG PO CP24: 1.0000 | ORAL_CAPSULE | Freq: Every day | ORAL | Status: DC

## 2013-01-10 NOTE — Progress Notes (Signed)
OFFICE NOTE  01/10/2013  CC:  Chief Complaint  Patient presents with  . Dementia    follow up     HPI: Patient is a 79 y.o. Caucasian male who is here for 3 mo f/u memory problems/dementia.  Our plan had been for him to be on a 3 mo trial of aricept and namenda.  However, there has been some med confusion and apparently he never got namenda 10mg  dosing via mail order and was instead sent the 28mg  XR Namenda capsules.  He says this dose caused HAs so he decided to open the cap and take 1/2 of the contents qAM and 1/2 qPM.  He tolerates this dosing fine.  Currently not on aricept--vivid dreams.   He has continued to see his neurologist, most recent visit 09/2012 --nothing changed and he was asked to f/u in 4-6 mo.   Seems to be getting a little worse regarding periods of short term memory problems and cognitive dysfunction like being easily overwhelmed with lots of external stimuli, doesn't think clearly much of the time.  His hearing impairment plays a big role in his cognition and behavior.  Wife says that he still covers his impairments up well.  Says he seems to be lacking in the ability to make a connections between things or events like a normal person can.   Pertinent PMH:  Past Medical History  Diagnosis Date  . Hearing deficit     Bilateral; noise damage in [employer].  Has had hearing aids since about 2008.  . Migraine headache with aura onset in teens    No migraines since about 2004  . BPH (benign prostatic hypertrophy)     History of acute urinary retention (2006)  Hx of elevated PSA (7.26) + right sided prostate nodule on DRE by urologist.  PSA did not go down with tx with antibiotics--biopsy was done and showed NO MALIGNANCY but some chronic inflammation was present--turned out related to acute cystitis (enterococcus UTI).  PSA was 1.17 in 07/2011 at urologist's (Dr. Vernie Ammons).  F/u at urol is now prn as of 07/2012.  Marland Kitchen Headache in back of head     Neurologist is treating him with  PT for this problem and it helps  . Mild cognitive impairment     Vs early/mild alzheimer's vs CNS degenerative disorder:  Dr. Clarisse Gouge; started aricept around 08/2011.  MRI 2013 showed extensive white matter disease; no acute problems.  Pt switched neurologic care to [facility] 11/2011.  Marland Kitchen History of TIA (transient ischemic attack) @ 2008    02/2001 and 01/2006 carotid dopplers showed 50% or less stenosis.  . DDD (degenerative disc disease), cervical     C5-6, C6-7 primarily  . History of solitary pulmonary nodule     Initially noted on CT abd for his adrenal hemorrhage s/p fall from ladder 2001.  F/u CT chest 04/2000 showed that it had shrunk from 12mm to 6mm and had no malignant features.  . Adrenal hemorrhage 2001    s/p fall from ladder--a f/u CT abd showed resolution of the hemorrhage- (duodenal polyp incidentally noted;  f/u EGD by Dr. Ewing Schlein 03/2000 showed small lipomatous mass in duodenum which was confirmed by biopsy.  Marland Kitchen BPPV (benign paroxysmal positional vertigo)   . History of acute cystitis 08/2009    with acute prostatitis (enterococcus faecalis)  . Actinic keratoses   . Left bundle branch hemiblock     mentioned in dx of old records 04/15/2009  . Arthralgia of knee, right  2009    exam suspicious for meniscal origin  . Skin cancer   . Prostate cancer    Past surgical, social, and family history reviewed and no changes noted since last office visit.  MEDS:  Outpatient Prescriptions Prior to Visit  Medication Sig Dispense Refill  . alfuzosin (UROXATRAL) 10 MG 24 hr tablet Take 1 tablet (10 mg total) by mouth daily.  90 tablet  1  . aspirin 325 MG EC tablet Take 1 tablet (325 mg total) by mouth daily.  90 tablet  3  . Memantine HCl ER (NAMENDA XR) 28 MG CP24 Take 28 mg by mouth daily.  90 capsule  3  . donepezil (ARICEPT) 10 MG tablet Take 1 tablet (10 mg total) by mouth at bedtime as needed.  90 tablet  3  . memantine (NAMENDA) 10 MG tablet Take 1 tablet (10 mg total) by  mouth 2 (two) times daily.  30 tablet  3   No facility-administered medications prior to visit.    PE: Blood pressure 132/84, pulse 67, temperature 98 F (36.7 C), temperature source Temporal, resp. rate 16, height 5' 7.5" (1.715 m), weight 148 lb (67.132 kg), SpO2 96.00%. Gen: Alert, well appearing.  Patient is oriented to person, place, time, and situation. AFFECT: pleasant, lucid thought and speech. Hard of hearing--his right ear's hearing aid has a dead battery as well. Neuro: CN 2-12 intact bilaterally, strength 5/5 in proximal and distal upper extremities and lower extremities bilaterally.    No tremor.   No ataxia.   No pronator drift.   MMSE today 29/30 (one off for a mistake when counting backwards from 100 by 7s). CV: RRR, no m/r/g.   LUNGS: CTA bilat, nonlabored resps, good aeration in all lung fields. EXT: no clubbing, cyanosis, or edema.   LAB: none today  IMPRESSION AND PLAN:  Mild cognitive impairment Even though patient and his wife fully understand that he does not fulfill criteria for dx of dementia, they wish to pursue maximum possible medical therapy. Due to miscommunication, patient has essentially been taking only namenda XR 28mg --1/2 cap bid for the last 44mo (approx).  In talking with a pharmacist today, we can not be sure the medication was even potentially effective the way he was splitting an XR capsule's contents in half, but we could also not find anything that says it would be dangerous. Will have him take Namenda XR 14mg  qAM and in 1 week he is to add Galantamine ER 8 mg qAM. Therapeutic expectations and side effect profile of medication discussed today.  Patient's questions answered.    An After Visit Summary was printed and given to the patient.  Spent 30 min with pt today, with >50% of this time spent in counseling and care coordination regarding the above problems.  FOLLOW UP:  4 wks

## 2013-01-10 NOTE — Assessment & Plan Note (Signed)
Even though patient and his wife fully understand that he does not fulfill criteria for dx of dementia, they wish to pursue maximum possible medical therapy. Due to miscommunication, patient has essentially been taking only namenda XR 28mg --1/2 cap bid for the last 4mo (approx).  In talking with a pharmacist today, we can not be sure the medication was even potentially effective the way he was splitting an XR capsule's contents in half, but we could also not find anything that says it would be dangerous. Will have him take Namenda XR 14mg  qAM and in 1 week he is to add Galantamine ER 8 mg qAM. Therapeutic expectations and side effect profile of medication discussed today.  Patient's questions answered.

## 2013-02-05 ENCOUNTER — Encounter: Payer: Self-pay | Admitting: Family Medicine

## 2013-02-05 ENCOUNTER — Ambulatory Visit (INDEPENDENT_AMBULATORY_CARE_PROVIDER_SITE_OTHER): Payer: Medicare Other | Admitting: Family Medicine

## 2013-02-05 VITALS — BP 116/68 | HR 64 | Temp 98.1°F | Resp 16 | Ht 67.5 in | Wt 147.0 lb

## 2013-02-05 DIAGNOSIS — G3184 Mild cognitive impairment, so stated: Secondary | ICD-10-CM

## 2013-02-05 DIAGNOSIS — R55 Syncope and collapse: Secondary | ICD-10-CM | POA: Diagnosis not present

## 2013-02-05 DIAGNOSIS — I951 Orthostatic hypotension: Secondary | ICD-10-CM | POA: Insufficient documentation

## 2013-02-05 NOTE — Progress Notes (Signed)
OFFICE NOTE  02/05/2013  CC:  Chief Complaint  Patient presents with  . Loss of Consciousness    yesterday morning      HPI: Patient is a 79 y.o. Caucasian male who is here for "not feeling well".    Passed out in middle of night when he went to urinate at 4 AM, then bent over to pull his underpants up.  He did hit the cabinet door with his forehead.  Brief LOC and then when he woke up he was alert and cognizant of surroundings and the situation. Seems to be walking around "in a 3 beer drunk" haze lately---since our change in alzheimers meds per pt and wife.  However, wife and patient and I can never 100% agree on what was decided upon as the recommended med regimen from visit to visit---their recollection and my notes differ.  This is the first such fall/syncopal episode he has had. Denies HA.  Denies focal weakness.  No CP, no palpitations, no SOB.  Pertinent PMH:  Past Medical History  Diagnosis Date  . Hearing deficit     Bilateral; noise damage in [employer].  Has had hearing aids since about 2008.  . Migraine headache with aura onset in teens    No migraines since about 2004  . BPH (benign prostatic hypertrophy)     History of acute urinary retention (2006)  Hx of elevated PSA (7.26) + right sided prostate nodule on DRE by urologist.  PSA did not go down with tx with antibiotics--biopsy was done and showed NO MALIGNANCY but some chronic inflammation was present--turned out related to acute cystitis (enterococcus UTI).  PSA was 1.17 in 07/2011 at urologist's (Dr. Vernie Ammons).  F/u at urol is now prn as of 07/2012.  Marland Kitchen Headache in back of head     Neurologist is treating him with PT for this problem and it helps  . Mild cognitive impairment     Vs early/mild alzheimer's vs CNS degenerative disorder:  Dr. Clarisse Gouge; started aricept around 08/2011.  MRI 2013 showed extensive white matter disease; no acute problems.  Pt switched neurologic care to [facility] 11/2011.  Marland Kitchen History of TIA  (transient ischemic attack) @ 2008    02/2001 and 01/2006 carotid dopplers showed 50% or less stenosis.  . DDD (degenerative disc disease), cervical     C5-6, C6-7 primarily  . History of solitary pulmonary nodule     Initially noted on CT abd for his adrenal hemorrhage s/p fall from ladder 2001.  F/u CT chest 04/2000 showed that it had shrunk from 12mm to 6mm and had no malignant features.  . Adrenal hemorrhage 2001    s/p fall from ladder--a f/u CT abd showed resolution of the hemorrhage- (duodenal polyp incidentally noted;  f/u EGD by Dr. Ewing Schlein 03/2000 showed small lipomatous mass in duodenum which was confirmed by biopsy.  Marland Kitchen BPPV (benign paroxysmal positional vertigo)   . History of acute cystitis 08/2009    with acute prostatitis (enterococcus faecalis)  . Actinic keratoses   . Left bundle branch hemiblock     mentioned in dx of old records 04/15/2009  . Arthralgia of knee, right 2009    exam suspicious for meniscal origin  . Skin cancer   . Prostate cancer    Past surgical, social, and family history reviewed and no changes noted since last office visit.  MEDS:  Outpatient Prescriptions Prior to Visit  Medication Sig Dispense Refill  . alfuzosin (UROXATRAL) 10 MG 24 hr tablet Take  1 tablet (10 mg total) by mouth daily.  90 tablet  1  . aspirin 325 MG EC tablet Take 1 tablet (325 mg total) by mouth daily.  90 tablet  3  . galantamine (RAZADYNE ER) 8 MG 24 hr capsule Take 1 capsule (8 mg total) by mouth daily with breakfast.  30 capsule  1  . Memantine HCl ER 14 MG CP24 Take 1 capsule by mouth daily with breakfast.  30 capsule  1   No facility-administered medications prior to visit.    PE: Blood pressure 116/68, pulse 64, temperature 98.1 F (36.7 C), temperature source Temporal, resp. rate 16, height 5' 7.5" (1.715 m), weight 147 lb (66.679 kg), SpO2 96.00%. Gen: Alert, well appearing.  Patient is oriented to person, place, time, and situation.  CV: RRR, no m/r/g.   LUNGS:  CTA bilat, nonlabored resps, good aeration in all lung fields. Neuro: CN 2-12 intact bilaterally, strength 5/5 in proximal and distal upper extremities and lower extremities bilaterally. No tremor.  No disdiadochokinesis.  No ataxia.  Upper extremity and lower extremity DTRs symmetric.  No pronator drift.   IMPRESSION AND PLAN:  Mild cognitive impairment Vs early dementia. Meds causing too much impairment and not helping any.  In addition, communication difficulties between pt, his wife, and myself have resulted in some confusion regarding what he is actually taking for this at any given point in time.   D/C galantamine and memantine. After 2-3 weeks off these meds, we'll recheck him to see if there is a way we can approach this from a standpoint of treating mood.  Orthostatic syncope Reassured pt and wife. Encouraged adequate hydration, sit on toilet to urinate in the night instead of standup and bend over to pull pants up. His dementia meds may have contributed to this some--we are d/c'ing these today.   An After Visit Summary was printed and given to the patient.  Spent 25 min with pt today, with >50% of this time spent in counseling and care coordination regarding the above problems.  FOLLOW UP: 2-3 wks

## 2013-02-05 NOTE — Assessment & Plan Note (Signed)
Vs early dementia. Meds causing too much impairment and not helping any.  In addition, communication difficulties between pt, his wife, and myself have resulted in some confusion regarding what he is actually taking for this at any given point in time.   D/C galantamine and memantine. After 2-3 weeks off these meds, we'll recheck him to see if there is a way we can approach this from a standpoint of treating mood.

## 2013-02-05 NOTE — Assessment & Plan Note (Signed)
Reassured pt and wife. Encouraged adequate hydration, sit on toilet to urinate in the night instead of standup and bend over to pull pants up. His dementia meds may have contributed to this some--we are d/c'ing these today.

## 2013-02-07 ENCOUNTER — Ambulatory Visit: Payer: Medicare Other | Admitting: Family Medicine

## 2013-02-20 ENCOUNTER — Ambulatory Visit (INDEPENDENT_AMBULATORY_CARE_PROVIDER_SITE_OTHER): Payer: Medicare Other | Admitting: Neurology

## 2013-02-20 ENCOUNTER — Encounter: Payer: Self-pay | Admitting: Neurology

## 2013-02-20 VITALS — BP 126/77 | HR 68 | Ht 67.0 in | Wt 146.0 lb

## 2013-02-20 DIAGNOSIS — G3184 Mild cognitive impairment, so stated: Secondary | ICD-10-CM | POA: Diagnosis not present

## 2013-02-20 MED ORDER — RIVASTIGMINE 4.6 MG/24HR TD PT24: 1.0000 | MEDICATED_PATCH | Freq: Every day | TRANSDERMAL | Status: DC

## 2013-02-20 NOTE — Progress Notes (Signed)
  History of Present Illness  HPI: Mr. 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  MRI of brain review with patient and his wife, there was no acute lesions, there is evidence of mild to moderate atrophy, extensive periventricular white matter disease,  Recent laboratory showed a normal CBC, CMP, TSH,  He also complains few months history of upper nuchal, occipital area headaches,  Ultrasound of carotid arteries showed 50-69% stenosis of right internal carotid artery, he is exercising regularly,   He could not tolerate Namenda xr 28mg  qday, complains of mild drunk, dizziness sensation, no veritgo, no significant GI side effect.  He was started on Donepazil by Dr. Milinda Cave, in Jan 2014, he complains of vivid dreams shortly afterwards, he came in for urgent followup    UPDATE Sept 23rd 2014: He got up 2-3 times each time to urinate in the middle of the night, he has to stand for a while to urinate, then he reaches done to pull up his pant, he fell, had transient loss of consciousness in February 05 2013, he woke up on the floor. His wife heard the thump, found him on the floor, donepezil, and Namenda was stopped after his fall.  He is back to his normal self now. Sometimes, when he got up quickly, he complains of dizziness.  He denies headaches, no visual change, today's Mini-Mental Status Examination is 29 out of 30,  Physical Exam   Neck: supple no carotid bruits Respiratory: clear to auscultation bilaterally Cardiovascular: regular rate rhythm  Neurologic Exam  Mental Status: pleasant, awake, alert, cooperative to history, talking, and casual conversation. [place] cognitive assessment 21/30, he missed 5/5 recall, not oriented to date, day, mild difficulty with attention, MMSE 29/30, He missed 1/3 recall Cranial Nerves: CN II-XII pupils were equal round reactive to light.  Fundi were sharp bilaterally.  Extraocular movements were full.  Visual fields were full on confrontational test.  Facial sensation and strength were normal.  Hearing was intact to finger rubbing bilaterally.  Uvula tongue were midline.  Head turning and shoulder shrugging were normal and symmetric.  Tongue protrusion into the cheeks strength were normal.  Motor: Normal tone, bulk, and strength. Sensory: Normal to light touch, pinprick, proprioception, and decreased vibratory sensation at midshin. Coordination: Normal finger-to-nose, heel-to-shin.  There was no dysmetria noticed. Gait and Station: Narrow based and steady, was able to perform tiptoe, heel, and tandem walking without difficulty.  Romberg sign: Negative Reflexes: Deep tendon reflexes: Biceps: 2/2, Brachioradialis: 2/2, Triceps: 2/2, Pateller: 2/2, Achilles: trace.  Plantar responses are flexor.   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 Mild cognitive impairment, vascular vs CNS degenerative disorder. 2.  Daily asa 325mg  qday. 3.  He and his wife wants to try Exelon patch 4.6mg /day.

## 2013-03-01 ENCOUNTER — Ambulatory Visit: Payer: Medicare Other | Admitting: Family Medicine

## 2013-04-05 ENCOUNTER — Other Ambulatory Visit: Payer: Self-pay

## 2013-04-07 ENCOUNTER — Other Ambulatory Visit: Payer: Self-pay | Admitting: Family Medicine

## 2013-06-20 ENCOUNTER — Other Ambulatory Visit: Payer: Self-pay | Admitting: Family Medicine

## 2013-07-25 ENCOUNTER — Telehealth: Payer: Self-pay | Admitting: Neurology

## 2013-07-25 MED ORDER — RIVASTIGMINE 4.6 MG/24HR TD PT24: 4.6000 mg | MEDICATED_PATCH | Freq: Every day | TRANSDERMAL | Status: DC

## 2013-07-25 NOTE — Telephone Encounter (Signed)
Rx has been sent  

## 2013-07-25 NOTE — Telephone Encounter (Signed)
Informed patient that RX has been sent

## 2013-07-25 NOTE — Telephone Encounter (Signed)
Patient's wife calling for husband needing refill of the Exelon patch - [facility].

## 2013-08-20 ENCOUNTER — Ambulatory Visit (INDEPENDENT_AMBULATORY_CARE_PROVIDER_SITE_OTHER): Payer: Medicare Other | Admitting: Neurology

## 2013-08-20 ENCOUNTER — Encounter: Payer: Self-pay | Admitting: Family Medicine

## 2013-08-20 ENCOUNTER — Encounter: Payer: Self-pay | Admitting: Neurology

## 2013-08-20 ENCOUNTER — Ambulatory Visit (INDEPENDENT_AMBULATORY_CARE_PROVIDER_SITE_OTHER): Payer: Medicare Other | Admitting: Family Medicine

## 2013-08-20 VITALS — BP 151/83 | HR 65 | Temp 98.0°F | Resp 18 | Ht 67.5 in | Wt 148.0 lb

## 2013-08-20 VITALS — BP 131/81 | HR 68 | Ht 67.0 in | Wt 145.0 lb

## 2013-08-20 DIAGNOSIS — F0390 Unspecified dementia without behavioral disturbance: Secondary | ICD-10-CM | POA: Diagnosis not present

## 2013-08-20 DIAGNOSIS — G3184 Mild cognitive impairment, so stated: Secondary | ICD-10-CM

## 2013-08-20 DIAGNOSIS — R413 Other amnesia: Secondary | ICD-10-CM | POA: Diagnosis not present

## 2013-08-20 DIAGNOSIS — Z8673 Personal history of transient ischemic attack (TIA), and cerebral infarction without residual deficits: Secondary | ICD-10-CM | POA: Diagnosis not present

## 2013-08-20 DIAGNOSIS — S161XXA Strain of muscle, fascia and tendon at neck level, initial encounter: Secondary | ICD-10-CM

## 2013-08-20 MED ORDER — RIVASTIGMINE 9.5 MG/24HR TD PT24: 4.6000 mg | MEDICATED_PATCH | Freq: Every day | TRANSDERMAL | Status: DC

## 2013-08-20 NOTE — Progress Notes (Signed)
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  MRI of brain review with patient and his wife, there was no acute lesions, there is evidence of mild to moderate atrophy, extensive periventricular white matter disease,  Recent laboratory showed a normal CBC, CMP, TSH,  He also complains few months history of upper nuchal, occipital area headaches,  Ultrasound of carotid arteries showed 50-69% stenosis of right internal carotid artery, he is exercising regularly,   He could not tolerate Namenda xr 28mg  qday, complains of mild drunk, dizziness sensation, no veritgo, no significant GI side effect.  He was started on Donepazil by Dr. Anitra Lauth, in Jan 2014, he complains of vivid dreams shortly afterwards, he came in for urgent followup   He got up 2-3 times each time to urinate in the middle of the night, he has to stand for a while to urinate, then he reaches done to pull up his pant, he fell, had transient loss of consciousness in February 05 2013, he woke up on the floor. His wife heard the thump, found him on the floor, donepezil, and Namenda was stopped after his fall.  He is back to his normal self now. Sometimes, when he got up quickly, he complains of dizziness.  He denies headaches, no visual change, today's Mini-Mental Status Examination is 85 out of 30,  UPDATE August 20 2013:  He is overall  doing well, continued to complains of slowly worsening memory trouble, he tends to loose his keys,  He has trouble with directions, he still swims every other day, no gait difficulty, worsening hearing loss  Physical Exam  Neck: supple no carotid bruits Respiratory: clear to auscultation bilaterally Cardiovascular: regular rate rhythm  Neurologic Exam  Mental Status: pleasant, awake, alert, cooperative to history, talking, and casual conversation. MMSE 29/30, He is not oriented to date Cranial Nerves: CN II-XII pupils were equal round reactive to light.  Fundi were sharp bilaterally.  Extraocular movements were full.  Visual fields were full on confrontational test.  Facial sensation and strength were normal.  Hearing was intact to finger rubbing bilaterally.  Uvula tongue were midline.  Head turning and shoulder shrugging were normal and symmetric.  Tongue protrusion into the cheeks strength were normal.  Motor: Normal tone, bulk, and strength. Sensory: Normal to light touch, pinprick, proprioception, and decreased vibratory sensation at midshin. Coordination: Normal finger-to-nose, heel-to-shin.  There was no dysmetria noticed. Gait and Station: Narrow based and steady, was able to perform tiptoe, heel, and tandem walking without difficulty.  Romberg sign: Negative Reflexes: Deep tendon reflexes: Biceps: 2/2, Brachioradialis: 2/2, Triceps: 2/2, Pateller: 2/2, Achilles: trace.  Plantar responses are flexor.   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 Mild cognitive impairment, vascular vs CNS degenerative disorder. 2.  Daily asa 325mg  qday. 3.  Increase Exelon patch to 9.5 mg/day.  4.  Axona sample was given to him, rx too 5. RTC in 6-9 months

## 2013-08-20 NOTE — Progress Notes (Signed)
OFFICE NOTE  08/20/2013  CC:  Chief Complaint  Patient presents with  . Follow-up     HPI: Patient is a 80 y.o. Caucasian male who is here for 6 mo f/u mild cognitive impairment vs dementia. Wife still says he is having trouble with "forgetfulness". Says namenda had helped with this in the past but he had HA and bad dreams so he stopped this med and these sx's went away.  Local neurologist saw him last in late September 2014 and started a trial of exelon patch per pt's wife's request (MMSE at that time was 29/30).  No change in cognitive/memory sx's per pt or wife.  Wife requests referral today to "the [facility]" at [facility]. Otherwise he is feeling ok, denies HA's.  No problems with mood or anxiety.  Pertinent PMH:  Past Medical History  Diagnosis Date  . Hearing deficit     Bilateral; noise damage in [employer].  Has had hearing aids since about 2008.  . Migraine headache with aura onset in teens    No migraines since about 2004  . BPH (benign prostatic hypertrophy)     History of acute urinary retention (2006)  Hx of elevated PSA (7.26) + right sided prostate nodule on DRE by urologist.  PSA did not go down with tx with antibiotics--biopsy was done and showed NO MALIGNANCY but some chronic inflammation was present--turned out related to acute cystitis (enterococcus UTI).  PSA was 1.17 in 07/2011 at urologist's (Dr. Karsten Ro).  F/u at urol is now prn as of 07/2012.  Marland Kitchen Headache in back of head     Neurologist is treating him with PT for this problem and it helps  . Mild cognitive impairment     Vs early/mild alzheimer's vs CNS degenerative disorder:  Dr. Catalina Gravel; started aricept around 08/2011.  MRI 2013 showed extensive white matter disease; no acute problems.  Pt switched neurologic care to [facility] 11/2011.  Marland Kitchen History of TIA (transient ischemic attack) @ 2008    02/2001 and 01/2006 carotid dopplers showed 50% or less stenosis.  . DDD (degenerative disc disease),  cervical     C5-6, C6-7 primarily  . History of solitary pulmonary nodule     Initially noted on CT abd for his adrenal hemorrhage s/p fall from ladder 2001.  F/u CT chest 04/2000 showed that it had shrunk from 28mm to 39mm and had no malignant features.  . Adrenal hemorrhage 2001    s/p fall from ladder--a f/u CT abd showed resolution of the hemorrhage- (duodenal polyp incidentally noted;  f/u EGD by Dr. Watt Climes 03/2000 showed small lipomatous mass in duodenum which was confirmed by biopsy.  Marland Kitchen BPPV (benign paroxysmal positional vertigo)   . History of acute cystitis 08/2009    with acute prostatitis (enterococcus faecalis)  . Actinic keratoses   . Left bundle branch hemiblock     mentioned in dx of old records 04/15/2009  . Arthralgia of knee, right 2009    exam suspicious for meniscal origin  . Skin cancer   . Prostate cancer    Past Surgical History  Procedure Laterality Date  . Prostate biopsy  04/07/2005    For elevated PSA + right sided prostate nodule on DRE by urologist:  benign biopsy.  . Tonsillectomy  preteen  . Dental      iimplants  . Cataract extrac      MEDS:  Outpatient Prescriptions Prior to Visit  Medication Sig Dispense Refill  . alfuzosin (UROXATRAL) 10  MG 24 hr tablet Take 1 tablet (10 mg total) by mouth daily.  90 tablet  0  . aspirin 325 MG EC tablet Take 1 tablet (325 mg total) by mouth daily.  90 tablet  3  . rivastigmine (EXELON) 4.6 mg/24hr Place 1 patch (4.6 mg total) onto the skin daily.  90 patch  1   No facility-administered medications prior to visit.    PE: Blood pressure 151/83, pulse 65, temperature 98 F (36.7 C), temperature source Temporal, resp. rate 18, height 5' 7.5" (1.715 m), weight 148 lb (67.132 kg), SpO2 97.00%. Gen: Alert, well appearing.  Patient is oriented to person, place, time, and situation. AFFECT: pleasant, lucid thought and speech. CV: RRR, no m/r/g.   LUNGS: CTA bilat, nonlabored resps, good aeration in all lung  fields.  LAB: none today  IMPRESSION AND PLAN:  Mild cognitive impairment vs dementia. Local neurologist's assessment has leaned towards MCI and she has suggested no meds but has tried several at the request of pt's wife (as have I).   Unfortunately, the only one of these that seems to have worked per wife (although historically her reports have been conflicting) has caused him HA's and bad nightmares (namenda). As per pt's wife's request, will order referral to [facility] for 2nd opinion.  Pt is also going to his scheduled f/u with Dr. Krista Blue, his local neurologist, today.  An After Visit Summary was printed and given to the patient.  FOLLOW UP: at his earliest convenience for fasting CPE

## 2013-08-20 NOTE — Progress Notes (Signed)
Pre visit review using our clinic review tool, if applicable. No additional management support is needed unless otherwise documented below in the visit note. 

## 2013-08-24 ENCOUNTER — Encounter: Payer: Self-pay | Admitting: Family Medicine

## 2013-08-24 ENCOUNTER — Ambulatory Visit (INDEPENDENT_AMBULATORY_CARE_PROVIDER_SITE_OTHER): Payer: Medicare Other | Admitting: Family Medicine

## 2013-08-24 VITALS — BP 143/84 | HR 60 | Temp 98.3°F | Resp 16 | Ht 67.5 in | Wt 145.0 lb

## 2013-08-24 DIAGNOSIS — N400 Enlarged prostate without lower urinary tract symptoms: Secondary | ICD-10-CM | POA: Diagnosis not present

## 2013-08-24 DIAGNOSIS — F0390 Unspecified dementia without behavioral disturbance: Secondary | ICD-10-CM | POA: Diagnosis not present

## 2013-08-24 DIAGNOSIS — Z8673 Personal history of transient ischemic attack (TIA), and cerebral infarction without residual deficits: Secondary | ICD-10-CM

## 2013-08-24 DIAGNOSIS — Z0000 Encounter for general adult medical examination without abnormal findings: Secondary | ICD-10-CM

## 2013-08-24 DIAGNOSIS — C61 Malignant neoplasm of prostate: Secondary | ICD-10-CM

## 2013-08-24 DIAGNOSIS — Z0389 Encounter for observation for other suspected diseases and conditions ruled out: Secondary | ICD-10-CM | POA: Diagnosis not present

## 2013-08-24 DIAGNOSIS — Z23 Encounter for immunization: Secondary | ICD-10-CM | POA: Diagnosis not present

## 2013-08-24 DIAGNOSIS — G459 Transient cerebral ischemic attack, unspecified: Secondary | ICD-10-CM

## 2013-08-24 DIAGNOSIS — N182 Chronic kidney disease, stage 2 (mild): Secondary | ICD-10-CM

## 2013-08-24 LAB — LIPID PANEL
Cholesterol: 194 mg/dL (ref 0–200)
HDL: 56.2 mg/dL (ref >39.00)
LDL Cholesterol: 122 mg/dL — ABNORMAL HIGH (ref 0–99)
Total CHOL/HDL Ratio: 3
Triglycerides: 80 mg/dL (ref 0.0–149.0)
VLDL: 16 mg/dL (ref 0.0–40.0)

## 2013-08-24 LAB — COMPREHENSIVE METABOLIC PANEL
ALT: 22 U/L (ref 0–53)
AST: 21 U/L (ref 0–37)
Albumin: 4.1 g/dL (ref 3.5–5.2)
Alkaline Phosphatase: 70 U/L (ref 39–117)
BUN: 16 mg/dL (ref 6–23)
CO2: 28 mEq/L (ref 19–32)
Calcium: 9.8 mg/dL (ref 8.4–10.5)
Chloride: 105 mEq/L (ref 96–112)
Creatinine, Ser: 1.1 mg/dL (ref 0.4–1.5)
GFR: 64.55 mL/min (ref >60.00)
Glucose, Bld: 89 mg/dL (ref 70–99)
Potassium: 4.3 mEq/L (ref 3.5–5.1)
Sodium: 138 mEq/L (ref 135–145)
Total Bilirubin: 1 mg/dL (ref 0.3–1.2)
Total Protein: 6.5 g/dL (ref 6.0–8.3)

## 2013-08-24 LAB — CBC WITH DIFFERENTIAL/PLATELET
Basophils Absolute: 0 10*3/uL (ref 0.0–0.1)
Basophils Relative: 0.4 % (ref 0.0–3.0)
Eosinophils Absolute: 0.2 10*3/uL (ref 0.0–0.7)
Eosinophils Relative: 3.6 % (ref 0.0–5.0)
HCT: 49 % (ref 39.0–52.0)
Hemoglobin: 16.3 g/dL (ref 13.0–17.0)
Lymphocytes Relative: 14 % (ref 12.0–46.0)
Lymphs Abs: 0.8 10*3/uL (ref 0.7–4.0)
MCHC: 33.4 g/dL (ref 30.0–36.0)
MCV: 100.3 fl — ABNORMAL HIGH (ref 78.0–100.0)
Monocytes Absolute: 0.6 10*3/uL (ref 0.1–1.0)
Monocytes Relative: 10.5 % (ref 3.0–12.0)
Neutro Abs: 4.3 10*3/uL (ref 1.4–7.7)
Neutrophils Relative %: 71.5 % (ref 43.0–77.0)
Platelets: 317 10*3/uL (ref 150.0–400.0)
RBC: 4.88 Mil/uL (ref 4.22–5.81)
RDW: 14.1 % (ref 11.5–14.6)
WBC: 6 10*3/uL (ref 4.5–10.5)

## 2013-08-24 LAB — PSA, MEDICARE: PSA: 1.73 ng/ml (ref 0.10–4.00)

## 2013-08-24 NOTE — Progress Notes (Signed)
Pre visit review using our clinic review tool, if applicable. No additional management support is needed unless otherwise documented below in the visit note. 

## 2013-08-24 NOTE — Progress Notes (Addendum)
Office Note 09/03/2013  CC:  Chief Complaint  Patient presents with  . Annual Exam    fasting    HPI:  Mitchell Abbott is a 80 y.o. White male who is here for CPE. Eats a healthy diet, works out 3 times per week. Memory problems progressing, see most recent office note. Says he is able to live with current BPH sx's, responding reasonably well to alfuzosin.  No acute complaints.  Past Medical History  Diagnosis Date  . Hearing deficit     Bilateral; noise damage in [employer].  Has had hearing aids since about 2008.  . Migraine headache with aura onset in teens    No migraines since about 2004  . BPH (benign prostatic hypertrophy)     History of acute urinary retention (2006)  Hx of elevated PSA (7.26) + right sided prostate nodule on DRE by urologist.  PSA did not go down with tx with antibiotics--biopsy was done and showed NO MALIGNANCY but some chronic inflammation was present--turned out related to acute cystitis (enterococcus UTI).  PSA was 1.17 in 07/2011 at urologist's (Dr. Karsten Ro).  F/u at urol is now prn as of 07/2012.  Marland Kitchen Headache in back of head     Neurologist treated him with PT for this problem and it helped  . Mild cognitive impairment     Vs early/mild alzheimer's vs CNS degenerative disorder:  Dr. Catalina Gravel; started aricept around 08/2011.  MRI 2013 showed extensive white matter disease; no acute problems.  Pt switched neurologic care to [facility] 11/2011, then requested [facility] referral as of 07/2013  . History of TIA (transient ischemic attack) @ 2008    02/2001 and 01/2006 carotid dopplers showed 50% or less stenosis.  . DDD (degenerative disc disease), cervical     C5-6, C6-7 primarily  . History of solitary pulmonary nodule     Initially noted on CT abd for his adrenal hemorrhage s/p fall from ladder 2001.  F/u CT chest 04/2000 showed that it had shrunk from 60mm to 40mm and had no malignant features.  . Adrenal hemorrhage 2001    s/p fall from  ladder--a f/u CT abd showed resolution of the hemorrhage- (duodenal polyp incidentally noted;  f/u EGD by Dr. Watt Climes 03/2000 showed small lipomatous mass in duodenum which was confirmed by biopsy.  Marland Kitchen BPPV (benign paroxysmal positional vertigo)   . History of acute cystitis 08/2009    with acute prostatitis (enterococcus faecalis)  . Actinic keratoses   . Left bundle branch hemiblock     mentioned in dx of old records 04/15/2009  . Arthralgia of knee, right 2009    exam suspicious for meniscal origin  . Skin cancer   . Chronic renal insufficiency, stage II (mild) 2015    CrCl 60's    Past Surgical History  Procedure Laterality Date  . Prostate biopsy  04/07/2005    For elevated PSA + right sided prostate nodule on DRE by urologist:  benign biopsy.  . Tonsillectomy  preteen  . Dental      iimplants  . Cataract extrac      Family History  Problem Relation Age of Onset  . Breast cancer Daughter   . Migraines Daughter   . Sudden death Mother 17    FH info obtained from Dr. Ovid Curd records.    History   Social History  . Marital Status: Married    Spouse Name: N/A    Number of Children: 4  . Years of  Education: PHD   Occupational History  . Not on file.   Social History Main Topics  . Smoking status: Former Smoker -- 2 years    Types: Cigarettes  . Smokeless tobacco: Never Used  . Alcohol Use: 0.6 oz/week    1 Cans of beer per week     Comment: "a good drink every other day"  . Drug Use: No  . Sexual Activity: Not on file   Other Topics Concern  . 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   Lives in [place].   Orig from [place], relocated to [place] in late 1970s.   Has 3 children from first marriage, one from second marriage.   One daughter is an OB/GYN in [place], [place].   Distant hx of smoking cigs, quit 1974, drinks a whisky drink qod.    Exercise: 3 X /week goes to "the club" and does nautilus and walking machine, also swims 3 x/week.    Outpatient Prescriptions Prior to Visit  Medication Sig Dispense Refill  . alfuzosin (UROXATRAL) 10 MG 24 hr tablet Take 1 tablet (10 mg total) by mouth daily.  90 tablet  0  . aspirin 325 MG EC tablet Take 1 tablet (325 mg total) by mouth daily.  90 tablet  3  . rivastigmine (EXELON) 9.5 mg/24hr Place 1 patch (9.5 mg total) onto the skin daily.  90 patch  3   No facility-administered medications prior to visit.    No Known Allergies  ROS Review of Systems  Constitutional: Negative for fever, chills, appetite change and fatigue.  HENT: Negative for congestion, dental problem, ear pain and sore throat.   Eyes: Negative for discharge, redness and visual disturbance.  Respiratory: Negative for cough, chest tightness, shortness of breath and wheezing.   Cardiovascular: Negative for chest pain, palpitations and leg swelling.  Gastrointestinal: Negative for nausea, vomiting, abdominal pain, diarrhea and blood in stool.  Genitourinary: Positive for difficulty urinating (chronic hesitancy and incomplete emptying, nocturia x 2-3--"dealing with it" on current med). Negative for dysuria, urgency, frequency, hematuria and flank pain.  Musculoskeletal: Negative for arthralgias, back pain, joint swelling, myalgias and neck stiffness.  Skin: Negative for pallor and rash.  Neurological: Negative for dizziness, speech difficulty, weakness and headaches.  Hematological: Negative for adenopathy. Does not bruise/bleed easily.  Psychiatric/Behavioral: Negative for confusion and sleep disturbance. The patient is not nervous/anxious.        Chronic memory/cognitive problems     PE; Blood pressure 143/84, pulse 60, temperature 98.3 F (36.8 C), temperature source Temporal, resp. rate 16, height 5' 7.5" (1.715 m), weight 145 lb (65.772 kg), SpO2 97.00%. Gen: Alert, well appearing.  Patient is oriented  to person, place, time, and situation. AFFECT: pleasant, lucid thought and speech. ENT: Ears: EACs clear, normal epithelium.  TMs with good light reflex and landmarks bilaterally.  Eyes: no injection, icteris, swelling, or exudate.  EOMI, PERRLA. Nose: no drainage or turbinate edema/swelling.  No injection or focal lesion.  Mouth: lips without lesion/swelling.  Oral mucosa pink and moist.  Dentition intact and without obvious caries or gingival swelling.  Oropharynx without erythema, exudate, or swelling.  Neck: supple/nontender.  No LAD, mass, or TM.  Carotid pulses 2+ bilaterally, without bruits. CV: RRR, no m/r/g.   LUNGS: CTA bilat, nonlabored resps, good aeration in all lung fields. ABD: soft, NT, ND,  BS normal.  No hepatospenomegaly or mass.  No bruits. EXT: no clubbing, cyanosis, or edema.  Musculoskeletal: no joint swelling, erythema, warmth, or tenderness.  ROM of all joints intact. Skin - no sores or suspicious lesions or rashes or color changes Rectal exam: negative without mass, lesions or tenderness, PROSTATE EXAM: smooth and symmetric without nodules or tenderness. Neuro: CN 2-12 intact bilaterally, strength 5/5 in proximal and distal upper extremities and lower extremities bilaterally.   No tremor.  No disdiadochokinesis.  No ataxia.  Upper extremity and lower extremity DTRs symmetric.  No pronator drift.   Pertinent labs:  None today  ASSESSMENT AND PLAN:   Dementia Progressing slowly. Pt seeking 2nd opinion at the aging center at [facility] soon.  History of TIA (transient ischemic attack) No further problems. Continue ASA 325mg  qd. Check FLP today.  Chronic renal insufficiency, stage II (mild) Recheck Cr/lytes today.  BPH (benign prostatic hypertrophy) The current medical regimen is effective;  continue present plan and medications.   Preventative health care DRE normal today. PSA drawn today. Tdap and prevnar 13 given IM today.   An After Visit Summary was  printed and given to the patient.  FOLLOW UP:  Return in about 4 months (around 12/24/2013) for f/u dementia.

## 2013-08-26 ENCOUNTER — Encounter: Payer: Self-pay | Admitting: Family Medicine

## 2013-09-03 DIAGNOSIS — Z0389 Encounter for observation for other suspected diseases and conditions ruled out: Secondary | ICD-10-CM | POA: Insufficient documentation

## 2013-09-03 DIAGNOSIS — Z0000 Encounter for general adult medical examination without abnormal findings: Secondary | ICD-10-CM | POA: Insufficient documentation

## 2013-09-03 DIAGNOSIS — N182 Chronic kidney disease, stage 2 (mild): Secondary | ICD-10-CM | POA: Insufficient documentation

## 2013-09-03 NOTE — Assessment & Plan Note (Signed)
No further problems. Continue ASA 325mg  qd. Check FLP today.

## 2013-09-03 NOTE — Assessment & Plan Note (Signed)
The current medical regimen is effective;  continue present plan and medications.  

## 2013-09-03 NOTE — Assessment & Plan Note (Signed)
DRE normal today. PSA drawn today. Tdap and prevnar 13 given IM today.

## 2013-09-03 NOTE — Assessment & Plan Note (Signed)
Recheck Cr/lytes today.

## 2013-09-03 NOTE — Assessment & Plan Note (Signed)
Progressing slowly. Pt seeking 2nd opinion at the aging center at [facility] soon.

## 2013-09-25 DIAGNOSIS — H26499 Other secondary cataract, unspecified eye: Secondary | ICD-10-CM | POA: Diagnosis not present

## 2014-01-08 DIAGNOSIS — G309 Alzheimer's disease, unspecified: Secondary | ICD-10-CM | POA: Diagnosis not present

## 2014-01-08 DIAGNOSIS — Z87891 Personal history of nicotine dependence: Secondary | ICD-10-CM | POA: Diagnosis not present

## 2014-01-08 DIAGNOSIS — F0280 Dementia in other diseases classified elsewhere without behavioral disturbance: Secondary | ICD-10-CM | POA: Diagnosis not present

## 2014-01-15 ENCOUNTER — Telehealth: Payer: Self-pay | Admitting: Family Medicine

## 2014-01-15 DIAGNOSIS — G301 Alzheimer's disease with late onset: Secondary | ICD-10-CM | POA: Insufficient documentation

## 2014-01-15 DIAGNOSIS — F0280 Dementia in other diseases classified elsewhere without behavioral disturbance: Secondary | ICD-10-CM | POA: Insufficient documentation

## 2014-01-15 MED ORDER — ALFUZOSIN HCL ER 10 MG PO TB24: 10.0000 mg | ORAL_TABLET | Freq: Every day | ORAL | Status: DC

## 2014-01-15 NOTE — Telephone Encounter (Signed)
Patient is out of med so needs it to be sent to [facility] but always send to [facility] in the future for 90 day supply.

## 2014-01-16 DIAGNOSIS — Z85828 Personal history of other malignant neoplasm of skin: Secondary | ICD-10-CM | POA: Diagnosis not present

## 2014-01-16 DIAGNOSIS — L570 Actinic keratosis: Secondary | ICD-10-CM | POA: Diagnosis not present

## 2014-01-23 ENCOUNTER — Telehealth: Payer: Self-pay | Admitting: Family Medicine

## 2014-01-23 NOTE — Telephone Encounter (Signed)
Patients wife is calling to get confirmation on how patient was DX with TIA, she thinks MRI but the doctor he is participating in a study with needs to know for sure

## 2014-01-23 NOTE — Telephone Encounter (Signed)
Pls call and clarify what the wife is asking. A "TIA" is diagnosed clinically (by a patient's symptoms and exam), not by an imaging study. He has had an MRI of his brain but it was not used to diagnose his TIA.  Let me know if there is more details to her question that will clarify things.-thx

## 2014-01-23 NOTE — Telephone Encounter (Signed)
Spoke with wife, she wanted to know when patient had an MRI because he is involved in a memory study at [facility]. I answered her questions through the records available in Epic.

## 2014-01-30 ENCOUNTER — Other Ambulatory Visit: Payer: Self-pay | Admitting: Family Medicine

## 2014-01-30 MED ORDER — ALFUZOSIN HCL ER 10 MG PO TB24: 10.0000 mg | ORAL_TABLET | Freq: Every day | ORAL | Status: DC

## 2014-02-20 ENCOUNTER — Ambulatory Visit (INDEPENDENT_AMBULATORY_CARE_PROVIDER_SITE_OTHER): Payer: Medicare Other | Admitting: Neurology

## 2014-02-20 ENCOUNTER — Encounter: Payer: Self-pay | Admitting: Neurology

## 2014-02-20 VITALS — BP 122/74 | HR 67 | Ht 67.5 in | Wt 150.0 lb

## 2014-02-20 DIAGNOSIS — G3184 Mild cognitive impairment, so stated: Secondary | ICD-10-CM | POA: Diagnosis not present

## 2014-02-20 MED ORDER — RIVASTIGMINE 9.5 MG/24HR TD PT24: MEDICATED_PATCH | TRANSDERMAL | Status: DC

## 2014-02-20 MED ORDER — MEMANTINE HCL ER 7 MG PO CP24: 7.0000 mg | ORAL_CAPSULE | Freq: Every day | ORAL | Status: DC

## 2014-02-20 NOTE — Progress Notes (Signed)
History of Present Illness  HPI: Mr. Boissonneault is 80 years old right-handed Caucasian male, referred by his primary care physician Dr. 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  MRI of brain review with patient and his wife, there was no acute lesions, there is evidence of mild to moderate atrophy, extensive periventricular white matter disease,  Laboratory showed a normal CBC, CMP, TSH,  He also complains few months history of upper nuchal, occipital area headaches,  Ultrasound of carotid arteries showed 50-69% stenosis of right internal carotid artery, he is exercising regularly,   He could not tolerate Namenda xr 28mg  qday, complains of mild drunk, dizziness sensation, no veritgo, no significant GI side effect.  He was started on Donepazil by Dr. Anitra Lauth, in Jan 2014, he complains of vivid dreams shortly afterwards, could not tolerate it.   He got up 2-3 times each time to urinate in the middle of the night, he has to stand for a while to urinate, then he reaches done to pull up his pant, he fell, had transient loss of consciousness in February 05 2013, he woke up on the floor. His wife heard the thump, found him on the floor, donepezil, and Namenda was stopped after his fall.  Sometimes, when he got up quickly, he complains of dizziness.    He still exercises, swims every other day, no gait difficulty, He has worsening hearing loss  UPDATE Sep 23rd 2015: While his wife went on a trip, he got more confused,  his family has to check on him daily. Mini-Mental Status Examination 29 out of 30   Physical Exam  Neck: supple no carotid bruits Respiratory: clear to auscultation bilaterally Cardiovascular: regular rate rhythm  Neurologic Exam  Mental Status: pleasant, awake, alert, cooperative to history, talking, and casual conversation. MMSE 29/30, He is not oriented to date Cranial Nerves: CN II-XII pupils were equal round reactive to light.  Fundi were sharp bilaterally.  Extraocular movements were full.  Visual fields were full on confrontational test.  Facial sensation and strength were normal.  Hearing was intact to finger rubbing bilaterally.  Uvula tongue were midline.  Head turning and shoulder shrugging were normal and symmetric.  Tongue protrusion into the cheeks strength were normal.  Motor: Normal tone, bulk, and strength. Sensory: Normal to light touch, pinprick, proprioception, and decreased vibratory sensation at midshin. Coordination: Normal finger-to-nose, heel-to-shin.  There was no dysmetria noticed. Gait and Station: Narrow based and steady, was able to perform tiptoe, heel, and tandem walking without difficulty.  Romberg sign: Negative Reflexes: Deep tendon reflexes: Biceps: 2/2, Brachioradialis: 2/2, Triceps: 2/2, Pateller: 2/2, Achilles: trace.  Plantar responses are flexor.   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 Mild cognitive impairment, vascular vs CNS degenerative disorder. 2.  Daily asa 325mg  qday. 3.  keep Exelon patch to 9.5 mg/day.  4.   Add on Namenda xr 7 mg every  day 5.  May consider Axona next visit 6. RTC in 6 months

## 2014-03-06 ENCOUNTER — Telehealth: Payer: Self-pay | Admitting: Family Medicine

## 2014-03-06 NOTE — Telephone Encounter (Signed)
Left detailed message.   

## 2014-03-06 NOTE — Telephone Encounter (Signed)
Patient is requesting a Living Will. Do we have any here in the office that he could complete?

## 2014-06-22 ENCOUNTER — Other Ambulatory Visit: Payer: Self-pay | Admitting: Family Medicine

## 2014-06-24 DIAGNOSIS — N401 Enlarged prostate with lower urinary tract symptoms: Secondary | ICD-10-CM | POA: Diagnosis not present

## 2014-06-24 DIAGNOSIS — N3000 Acute cystitis without hematuria: Secondary | ICD-10-CM | POA: Diagnosis not present

## 2014-06-24 DIAGNOSIS — R350 Frequency of micturition: Secondary | ICD-10-CM | POA: Diagnosis not present

## 2014-07-04 ENCOUNTER — Ambulatory Visit (INDEPENDENT_AMBULATORY_CARE_PROVIDER_SITE_OTHER): Payer: Medicare Other | Admitting: Family Medicine

## 2014-07-04 ENCOUNTER — Encounter: Payer: Self-pay | Admitting: Family Medicine

## 2014-07-04 VITALS — BP 123/83 | HR 75 | Temp 97.8°F | Ht 67.5 in | Wt 148.0 lb

## 2014-07-04 DIAGNOSIS — Z87440 Personal history of urinary (tract) infections: Secondary | ICD-10-CM

## 2014-07-04 DIAGNOSIS — R197 Diarrhea, unspecified: Secondary | ICD-10-CM

## 2014-07-04 DIAGNOSIS — R350 Frequency of micturition: Secondary | ICD-10-CM | POA: Diagnosis not present

## 2014-07-04 DIAGNOSIS — Z23 Encounter for immunization: Secondary | ICD-10-CM

## 2014-07-04 LAB — POCT URINALYSIS DIPSTICK
Bilirubin, UA: NEGATIVE
Blood, UA: NEGATIVE
Glucose, UA: NEGATIVE
Ketones, UA: NEGATIVE
Nitrite, UA: NEGATIVE
Protein, UA: NEGATIVE
Spec Grav, UA: 1.015
Urobilinogen, UA: 0.2
pH, UA: 7.5

## 2014-07-04 NOTE — Progress Notes (Signed)
OFFICE NOTE  07/04/2014  CC:  Multiple complaints-acute  HPI: Patient is a 81 y.o. Caucasian male who is here for: 1) Last few weeks approx he has had to sit to urinate b/c fairly often he has a loose bm at the same time w/out being able to control it.  Also lots more grumbling from stomach, but no nausea or vomiting.  Appetite normal.  No fever.   (Essentially, having diarrhea for a couple weeks without signif other sx's).   Painful urination has been a complaint lately.  Recently he was rx'd 7d of antibiotics by the on-call urologist when they reported sx's to him and his dysuria resolved but urinary frequency/urgency not much changed. A bit more difficulty cognitively over his baseline lately.  ROS: question of HA and dizziness lately.  Pertinent PMH:  Past medical, surgical, social, and family history reviewed and no changes are noted since last office visit.  MEDS: Taking exelon patch but not namenda  Outpatient Prescriptions Prior to Visit  Medication Sig Dispense Refill  . alfuzosin (UROXATRAL) 10 MG 24 hr tablet TAKE 1 TABLET DAILY 90 tablet 0  . aspirin 325 MG EC tablet Take 1 tablet (325 mg total) by mouth daily. 90 tablet 3  . Memantine HCl ER (NAMENDA XR) 7 MG CP24 Take 1 capsule (7 mg total) by mouth daily. 30 capsule 11  . rivastigmine (EXELON) 9.5 mg/24hr One patch every day 90 patch 3   No facility-administered medications prior to visit.    PE: Blood pressure 123/83, pulse 75, temperature 97.8 F (36.6 C), temperature source Temporal, height 5' 7.5" (1.715 m), weight 148 lb (67.132 kg), SpO2 96 %. Gen: Alert, well appearing.  Patient is oriented to person, place, time, and situation. BPZ:WCHE: no injection, icteris, swelling, or exudate.  EOMI, PERRLA. Mouth: lips without lesion/swelling.  Oral mucosa pink and moist. Oropharynx without erythema, exudate, or swelling.  CV: RRR, no m/r/g.   LUNGS: CTA bilat, nonlabored resps, good aeration in all lung fields. ABD:  soft, NT/ND, BS hyperactive DRE: rectal tone is mildly diminished but he is able to contract his sphincter, prostate is without nodularity or tenderness.  LAB: CC UA: trace LEU, otherwise normal.  IMPRESSION AND PLAN:  1) Recent UTI; per pt the c/s confirmed infection and confirmed that levaquin (the abx rx'd) killed the bacteria that it grew. Will recheck urine culture today given unclear GU symptomatology chronically and question of unresolved acute sx's.  2) Diarrhea; abx given AFTER this had started (the best I can tell). Check stool for C diff, rota, giardia/crypto, fecal lactoferrin, bacterial culture.  An After Visit Summary was printed and given to the patient.  FOLLOW UP: prn

## 2014-07-04 NOTE — Addendum Note (Signed)
Addended by: Ralph Dowdy on: 07/04/2014 02:33 PM   Modules accepted: Orders

## 2014-07-04 NOTE — Progress Notes (Signed)
Pre visit review using our clinic review tool, if applicable. No additional management support is needed unless otherwise documented below in the visit note. 

## 2014-07-05 ENCOUNTER — Other Ambulatory Visit: Payer: Self-pay | Admitting: Family Medicine

## 2014-07-05 DIAGNOSIS — R197 Diarrhea, unspecified: Secondary | ICD-10-CM | POA: Diagnosis not present

## 2014-07-05 DIAGNOSIS — R350 Frequency of micturition: Secondary | ICD-10-CM | POA: Diagnosis not present

## 2014-07-05 DIAGNOSIS — Z87440 Personal history of urinary (tract) infections: Secondary | ICD-10-CM | POA: Diagnosis not present

## 2014-07-06 LAB — ROTAVIRUS ANTIGEN, STOOL: Rotavirus: NEGATIVE

## 2014-07-06 LAB — URINE CULTURE
Colony Count: NO GROWTH
Organism ID, Bacteria: NO GROWTH

## 2014-07-07 LAB — FECAL LACTOFERRIN, QUANT: Lactoferrin: NEGATIVE

## 2014-07-07 LAB — CLOSTRIDIUM DIFFICILE BY PCR: Toxigenic C. Difficile by PCR: NOT DETECTED

## 2014-07-08 ENCOUNTER — Telehealth: Payer: Self-pay | Admitting: Family Medicine

## 2014-07-08 LAB — GIARDIA/CRYPTOSPORIDIUM (EIA)
Cryptosporidium Screen (EIA): NEGATIVE
Giardia Screen (EIA): NEGATIVE

## 2014-07-08 NOTE — Telephone Encounter (Signed)
Patient has OV with Dr. Karsten Ro with [facility] 07/09/14. Please send urine results to their office.

## 2014-07-08 NOTE — Telephone Encounter (Signed)
Pls send most recent UA and urine culture that we have (from his recent o/v) to Dr. Simone Curia attention at [facility].-thx

## 2014-07-08 NOTE — Telephone Encounter (Signed)
Please advise 

## 2014-07-12 LAB — STOOL CULTURE

## 2014-07-16 DIAGNOSIS — F0281 Dementia in other diseases classified elsewhere with behavioral disturbance: Secondary | ICD-10-CM | POA: Diagnosis not present

## 2014-07-16 DIAGNOSIS — L821 Other seborrheic keratosis: Secondary | ICD-10-CM | POA: Diagnosis not present

## 2014-07-16 DIAGNOSIS — L820 Inflamed seborrheic keratosis: Secondary | ICD-10-CM | POA: Diagnosis not present

## 2014-07-16 DIAGNOSIS — Z636 Dependent relative needing care at home: Secondary | ICD-10-CM | POA: Diagnosis not present

## 2014-07-16 DIAGNOSIS — G301 Alzheimer's disease with late onset: Secondary | ICD-10-CM | POA: Diagnosis not present

## 2014-07-22 ENCOUNTER — Encounter: Payer: Self-pay | Admitting: Family Medicine

## 2014-08-21 ENCOUNTER — Ambulatory Visit: Payer: Medicare Other | Admitting: Neurology

## 2014-09-03 ENCOUNTER — Other Ambulatory Visit: Payer: Self-pay | Admitting: Family Medicine

## 2014-09-23 DIAGNOSIS — N401 Enlarged prostate with lower urinary tract symptoms: Secondary | ICD-10-CM | POA: Diagnosis not present

## 2014-09-23 DIAGNOSIS — R351 Nocturia: Secondary | ICD-10-CM | POA: Diagnosis not present

## 2014-09-25 ENCOUNTER — Encounter: Payer: Self-pay | Admitting: Family Medicine

## 2014-10-10 DIAGNOSIS — H26493 Other secondary cataract, bilateral: Secondary | ICD-10-CM | POA: Diagnosis not present

## 2014-10-10 DIAGNOSIS — H524 Presbyopia: Secondary | ICD-10-CM | POA: Diagnosis not present

## 2014-10-11 ENCOUNTER — Encounter: Payer: Self-pay | Admitting: Family Medicine

## 2014-10-11 ENCOUNTER — Ambulatory Visit (INDEPENDENT_AMBULATORY_CARE_PROVIDER_SITE_OTHER): Payer: Medicare Other | Admitting: Family Medicine

## 2014-10-11 VITALS — Temp 97.4°F | Resp 16 | Wt 149.0 lb

## 2014-10-11 DIAGNOSIS — I951 Orthostatic hypotension: Secondary | ICD-10-CM | POA: Diagnosis not present

## 2014-10-11 DIAGNOSIS — T50905A Adverse effect of unspecified drugs, medicaments and biological substances, initial encounter: Secondary | ICD-10-CM

## 2014-10-11 DIAGNOSIS — T887XXA Unspecified adverse effect of drug or medicament, initial encounter: Secondary | ICD-10-CM

## 2014-10-11 DIAGNOSIS — N401 Enlarged prostate with lower urinary tract symptoms: Secondary | ICD-10-CM

## 2014-10-11 DIAGNOSIS — N138 Other obstructive and reflux uropathy: Secondary | ICD-10-CM

## 2014-10-11 LAB — COMPREHENSIVE METABOLIC PANEL
ALT: 16 U/L (ref 0–53)
AST: 23 U/L (ref 0–37)
Albumin: 4 g/dL (ref 3.5–5.2)
Alkaline Phosphatase: 84 U/L (ref 39–117)
BUN: 16 mg/dL (ref 6–23)
CO2: 29 mEq/L (ref 19–32)
Calcium: 9.9 mg/dL (ref 8.4–10.5)
Chloride: 103 mEq/L (ref 96–112)
Creatinine, Ser: 1.04 mg/dL (ref 0.40–1.50)
GFR: 71.57 mL/min (ref >60.00)
Glucose, Bld: 81 mg/dL (ref 70–99)
Potassium: 4.7 mEq/L (ref 3.5–5.1)
Sodium: 136 mEq/L (ref 135–145)
Total Bilirubin: 0.7 mg/dL (ref 0.2–1.2)
Total Protein: 6.5 g/dL (ref 6.0–8.3)

## 2014-10-11 LAB — CBC WITH DIFFERENTIAL/PLATELET
Basophils Absolute: 0 10*3/uL (ref 0.0–0.1)
Basophils Relative: 0.5 % (ref 0.0–3.0)
Eosinophils Absolute: 0.3 10*3/uL (ref 0.0–0.7)
Eosinophils Relative: 4.1 % (ref 0.0–5.0)
HCT: 47.2 % (ref 39.0–52.0)
Hemoglobin: 15.9 g/dL (ref 13.0–17.0)
Lymphocytes Relative: 19 % (ref 12.0–46.0)
Lymphs Abs: 1.2 10*3/uL (ref 0.7–4.0)
MCHC: 33.6 g/dL (ref 30.0–36.0)
MCV: 97.2 fl (ref 78.0–100.0)
Monocytes Absolute: 0.8 10*3/uL (ref 0.1–1.0)
Monocytes Relative: 12 % (ref 3.0–12.0)
Neutro Abs: 4.2 10*3/uL (ref 1.4–7.7)
Neutrophils Relative %: 64.4 % (ref 43.0–77.0)
Platelets: 274 10*3/uL (ref 150.0–400.0)
RBC: 4.86 Mil/uL (ref 4.22–5.81)
RDW: 14.1 % (ref 11.5–15.5)
WBC: 6.5 10*3/uL (ref 4.0–10.5)

## 2014-10-11 LAB — POCT URINALYSIS DIPSTICK
Bilirubin, UA: NEGATIVE
Blood, UA: NEGATIVE
Glucose, UA: NEGATIVE
Ketones, UA: NEGATIVE
Leukocytes, UA: NEGATIVE
Nitrite, UA: NEGATIVE
Spec Grav, UA: >=1.03
Urobilinogen, UA: 0.2
pH, UA: 6

## 2014-10-11 NOTE — Progress Notes (Signed)
Pre visit review using our clinic review tool, if applicable. No additional management support is needed unless otherwise documented below in the visit note. 

## 2014-10-11 NOTE — Patient Instructions (Signed)
Decrease Rapaflo to 1/2 of 8 mg tab once a day. If dizziness goes away and this dose still helps your urine flow then continue 4mg  rapaflo per day. If dizziness continues despite taking lower rapaflo dose then STOP rapaflo entirely.  Drink at least 6 cups of clear liquids per day in daytime (8 oz per cup)

## 2014-10-11 NOTE — Progress Notes (Signed)
OFFICE VISIT  10/11/2014   CC:  Chief Complaint  Patient presents with  . Dizziness    x 2-3 weeks   HPI:    Patient is a 81 y.o. Caucasian male who presents for dizziness, mainly orthostatic x 3 weeks.  Seems to have some mild chronic disequilibrium sensation between orthostatic episodes as well. He started Rapaflo in place of his uroxatral via his urologist around the time this all started (this med was started 09/23/14). No syncope.  No nausea.  No vision changes.  No headaches.  Eating and drinking "normally" per pt. Wife says he definitely drinks LESS than 8 cups of liquids per day.  Nocturia has been much improved on the rapaflo compared to uroxatral per pt.  Past Medical History  Diagnosis Date  . Hearing deficit     Bilateral; noise damage in [employer].  Has had hearing aids since about 2008.  . Migraine headache with aura onset in teens    No migraines since about 2004  . BPH (benign prostatic hypertrophy)     History of acute urinary retention (2006)  Hx of elevated PSA (7.26) + right sided prostate nodule on DRE by urologist.  PSA did not go down with tx with antibiotics--biopsy was done and showed NO MALIGNANCY but some chronic inflammation was present--turned out related to acute cystitis (enterococcus UTI).  PSA was 1.17 in 07/2011 at urologist's (Dr. Karsten Ro).  F/u at urol is now prn as of 08/2014.  Marland Kitchen Headache in back of head     Neurologist treated him with PT for this problem and it helped  . Alzheimer's dementia     [facility] referral as of 07/2013.  They added namenda 07/2014 due to pt's signif decline in the prior 6 mo.  Marland Kitchen History of TIA (transient ischemic attack) @ 2008    02/2001 and 01/2006 carotid dopplers showed 50% or less stenosis.  . DDD (degenerative disc disease), cervical     C5-6, C6-7 primarily  . History of solitary pulmonary nodule     Initially noted on CT abd for his adrenal hemorrhage s/p fall from ladder 2001.  F/u CT chest 04/2000 showed  that it had shrunk from 65mm to 20mm and had no malignant features.  . Adrenal hemorrhage 2001    s/p fall from ladder--a f/u CT abd showed resolution of the hemorrhage- (duodenal polyp incidentally noted;  f/u EGD by Dr. Watt Climes 03/2000 showed small lipomatous mass in duodenum which was confirmed by biopsy.  Marland Kitchen BPPV (benign paroxysmal positional vertigo)   . History of acute cystitis 08/2009    with acute prostatitis (enterococcus faecalis)  . Actinic keratoses   . Left bundle branch hemiblock     mentioned in dx of old records 04/15/2009  . Arthralgia of knee, right 2009    exam suspicious for meniscal origin  . Skin cancer   . Chronic renal insufficiency, stage II (mild) 2015    CrCl 60's    Past Surgical History  Procedure Laterality Date  . Prostate biopsy  04/07/2005    For elevated PSA + right sided prostate nodule on DRE by urologist:  benign biopsy.  . Tonsillectomy  preteen  . Dental      implants  . Cataract extrac     MEDS: not taking uroxatral below. He is taking rapaflo 8 mg qd currently. Outpatient Prescriptions Prior to Visit  Medication Sig Dispense Refill  . alfuzosin (UROXATRAL) 10 MG 24 hr tablet TAKE 1 TABLET DAILY 90  tablet 1  . aspirin 325 MG EC tablet Take 1 tablet (325 mg total) by mouth daily. 90 tablet 3  . CALCIUM PO Take 300 mg by mouth daily.    . Cholecalciferol 4000 UNITS CAPS Take 4,000 Units by mouth daily.    . Glucosamine HCl (GLUCOSAMINE PO) Take by mouth daily.    Marland Kitchen MAGNESIUM PO Take by mouth daily.    . Multiple Vitamins-Minerals (MULTIVITAMIN PO) Take by mouth daily.    . rivastigmine (EXELON) 9.5 mg/24hr One patch every day 90 patch 3   No facility-administered medications prior to visit.    No Known Allergies  ROS As per HPI  PE: Temperature 97.4 F (36.3 C), temperature source Oral, resp. rate 16, weight 149 lb (67.586 kg).  BP supine 110/72, HR 52, oxygen 97%. BP sitting up 134/84, HR 56, oxygen 98% BP standing up 104/68, HR  60, oxygen 93%  Gen: Alert, well appearing.  Patient is oriented to person, place, and situation. FAO:ZHYQ: no injection, icteris, swelling, or exudate.  EOMI, PERRLA. Mouth: lips without lesion/swelling.  Oral mucosa pink and moist. Oropharynx without erythema, exudate, or swelling.  CV: RRR, no m/r/g.   LUNGS: CTA bilat, nonlabored resps, good aeration in all lung fields. EXT: no clubbing, cyanosis, or edema.  Neuro: CN 2-12 intact bilaterally, strength 5/5 in proximal and distal upper extremities and lower extremities bilaterally.    No tremor.   No ataxia.  Upper extremity and lower extremity DTRs symmetric.  No pronator drift.  LABS:    Chemistry      Component Value Date/Time   NA 138 08/24/2013 0937   K 4.3 08/24/2013 0937   CL 105 08/24/2013 0937   CO2 28 08/24/2013 0937   BUN 16 08/24/2013 0937   CREATININE 1.1 08/24/2013 0937      Component Value Date/Time   CALCIUM 9.8 08/24/2013 0937   ALKPHOS 70 08/24/2013 0937   AST 21 08/24/2013 0937   ALT 22 08/24/2013 0937   BILITOT 1.0 08/24/2013 0937     Lab Results  Component Value Date   WBC 6.0 08/24/2013   HGB 16.3 08/24/2013   HCT 49.0 08/24/2013   MCV 100.3* 08/24/2013   PLT 317.0 08/24/2013   CC UA today showed trace protein, SG >1.030, otherwise normal.   IMPRESSION AND PLAN:  Orthostatic hypotension, suspect med side effect from Rapaflo. Will try to see how he does on 1/2 of 8mg  rapaflo tab per day: instructions for pt: Decrease Rapaflo to 1/2 of 8 mg tab once a day. If dizziness goes away and this dose still helps your urine flow then continue 4mg  rapaflo per day. If dizziness continues despite taking lower rapaflo dose then STOP rapaflo entirely.  Drink at least 6 cups of clear liquids per day in daytime (8 oz per cup)  Will check CBC w/diff and CMET today (not fasting)  FOLLOW UP: Return for keep appt already scheduled . next week for CPE

## 2014-10-15 ENCOUNTER — Encounter: Payer: Medicare Other | Admitting: Family Medicine

## 2014-10-22 ENCOUNTER — Encounter: Payer: Self-pay | Admitting: Family Medicine

## 2014-10-22 ENCOUNTER — Ambulatory Visit (INDEPENDENT_AMBULATORY_CARE_PROVIDER_SITE_OTHER): Payer: Medicare Other | Admitting: Family Medicine

## 2014-10-22 VITALS — BP 119/78 | HR 59 | Temp 97.6°F | Resp 16 | Ht 67.5 in | Wt 152.0 lb

## 2014-10-22 DIAGNOSIS — F0390 Unspecified dementia without behavioral disturbance: Secondary | ICD-10-CM

## 2014-10-22 DIAGNOSIS — N182 Chronic kidney disease, stage 2 (mild): Secondary | ICD-10-CM

## 2014-10-22 DIAGNOSIS — Z13220 Encounter for screening for lipoid disorders: Secondary | ICD-10-CM

## 2014-10-22 DIAGNOSIS — N401 Enlarged prostate with lower urinary tract symptoms: Secondary | ICD-10-CM | POA: Diagnosis not present

## 2014-10-22 DIAGNOSIS — R42 Dizziness and giddiness: Secondary | ICD-10-CM

## 2014-10-22 DIAGNOSIS — N138 Other obstructive and reflux uropathy: Secondary | ICD-10-CM

## 2014-10-22 DIAGNOSIS — Z0000 Encounter for general adult medical examination without abnormal findings: Secondary | ICD-10-CM

## 2014-10-22 LAB — LIPID PANEL
Cholesterol: 168 mg/dL (ref 0–200)
HDL: 53.1 mg/dL (ref >39.00)
LDL Cholesterol: 97 mg/dL (ref 0–99)
NonHDL: 114.9
Total CHOL/HDL Ratio: 3
Triglycerides: 91 mg/dL (ref 0.0–149.0)
VLDL: 18.2 mg/dL (ref 0.0–40.0)

## 2014-10-22 MED ORDER — ZOSTER VACCINE LIVE 19400 UNT/0.65ML ~~LOC~~ SOLR: 0.6500 mL | Freq: Once | SUBCUTANEOUS | Status: DC

## 2014-10-22 NOTE — Progress Notes (Signed)
Office Note 10/24/2014  CC:  Chief Complaint  Patient presents with  . Annual Exam    Pt is fasting.    HPI:  Mitchell Abbott is a 81 y.o. White male who is here for chronic med problem f/u, is fasting. Primary chronic issue is progressive dementia which is complicated by bilat hearing impairment that is chronic. His memory and cognitive functioning is still seeming to get worse gradually per wife's report, still tries to deny/hide it from everyone.  Also has hx of CRI stage 2 and BPH with hx of prostatitis. I recently saw him for orthostatic dizziness that we believe was associated with a switch from uroxatral to rapaflo. Decreasing the dose from 8 mg rapaflo to 4 mg rapaflo has helped significantly.   He is fasting for FLP today.  Recent labs showed normal CMET and CBC.   Past Medical History  Diagnosis Date  . Hearing deficit     Bilateral; noise damage in [employer].  Has had hearing aids since about 2008.  . Migraine headache with aura onset in teens    No migraines since about 2004  . BPH (benign prostatic hypertrophy)     History of acute urinary retention (2006)  Hx of elevated PSA (7.26) + right sided prostate nodule on DRE by urologist.  PSA did not go down with tx with antibiotics--biopsy was done and showed NO MALIGNANCY but some chronic inflammation was present--turned out related to acute cystitis (enterococcus UTI).  PSA was 1.17 in 07/2011 at urologist's (Dr. Karsten Ro).  F/u at urol is now prn as of 08/2014.  Marland Kitchen Headache in back of head     Neurologist treated him with PT for this problem and it helped  . Alzheimer's dementia     [facility] referral as of 07/2013.  They added namenda 07/2014 due to pt's signif decline in the prior 6 mo.  Marland Kitchen History of TIA (transient ischemic attack) @ 2008    02/2001 and 01/2006 carotid dopplers showed 50% or less stenosis.  . DDD (degenerative disc disease), cervical     C5-6, C6-7 primarily  . History of solitary pulmonary  nodule     Initially noted on CT abd for his adrenal hemorrhage s/p fall from ladder 2001.  F/u CT chest 04/2000 showed that it had shrunk from 7mm to 50mm and had no malignant features.  . Adrenal hemorrhage 2001    s/p fall from ladder--a f/u CT abd showed resolution of the hemorrhage- (duodenal polyp incidentally noted;  f/u EGD by Dr. Watt Climes 03/2000 showed small lipomatous mass in duodenum which was confirmed by biopsy.  Marland Kitchen BPPV (benign paroxysmal positional vertigo)   . History of acute cystitis 08/2009    with acute prostatitis (enterococcus faecalis)  . Actinic keratoses   . Left bundle branch hemiblock     mentioned in dx of old records 04/15/2009  . Arthralgia of knee, right 2009    exam suspicious for meniscal origin  . Skin cancer   . Chronic renal insufficiency, stage II (mild) 2015    CrCl 60's    Past Surgical History  Procedure Laterality Date  . Prostate biopsy  04/07/2005    For elevated PSA + right sided prostate nodule on DRE by urologist:  benign biopsy.  . Tonsillectomy  preteen  . Dental      implants  . Cataract extrac      Family History  Problem Relation Age of Onset  . Breast cancer Daughter   .  Migraines Daughter   . Sudden death Mother 56    FH info obtained from Dr. Ovid Curd records.    History   Social History  . Marital Status: Married    Spouse Name: N/A  . Number of Children: 4  . Years of Education: PHD   Occupational History  . Not on file.   Social History Main Topics  . Smoking status: Former Smoker -- 2 years    Types: Cigarettes  . Smokeless tobacco: Never Used  . Alcohol Use: 0.6 oz/week    1 Cans of beer per week     Comment: "a good drink every day" BEER/WINE  . Drug Use: No  . Sexual Activity: Not on file   Other Topics Concern  . 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   Lives in [place].    Orig from [place], relocated to [place] in late 1970s.   Has 3 children from first marriage, one from second marriage.   One daughter is an OB/GYN in [place].   Distant hx of smoking cigs, quit 1974, drinks a whisky drink qod.   Exercise: 3 X /week goes to "the club" and does nautilus and walking machine, also swims 3 x/week.    Outpatient Prescriptions Prior to Visit  Medication Sig Dispense Refill  . aspirin 325 MG EC tablet Take 1 tablet (325 mg total) by mouth daily. 90 tablet 3  . CALCIUM PO Take 300 mg by mouth daily.    . Cholecalciferol 4000 UNITS CAPS Take 4,000 Units by mouth daily.    . Glucosamine HCl (GLUCOSAMINE PO) Take by mouth daily.    Marland Kitchen MAGNESIUM PO Take by mouth daily.    . Multiple Vitamins-Minerals (MULTIVITAMIN PO) Take by mouth daily.    . rivastigmine (EXELON) 9.5 mg/24hr One patch every day 90 patch 3  . alfuzosin (UROXATRAL) 10 MG 24 hr tablet TAKE 1 TABLET DAILY (Patient not taking: Reported on 10/22/2014) 90 tablet 1   No facility-administered medications prior to visit.    No Known Allergies  ROS Review of Systems  Constitutional: Negative for fever and fatigue.  HENT: Negative for congestion and sore throat.   Eyes: Negative for visual disturbance.  Respiratory: Negative for cough.   Cardiovascular: Negative for chest pain.  Gastrointestinal: Negative for nausea and abdominal pain.  Genitourinary: Negative for dysuria.  Musculoskeletal: Negative for back pain and joint swelling.  Skin: Negative for rash.  Neurological: Positive for dizziness. Negative for weakness and headaches.  Hematological: Negative for adenopathy.    PE; Blood pressure 119/78, pulse 59, temperature 97.6 F (36.4 C), temperature source Oral, resp. rate 16, height 5' 7.5" (1.715 m), weight 152 lb (68.947 kg), SpO2 98 %. Gen: Alert, well appearing.  Patient is oriented to person, place, and situation. Affect: jocular, pleasant.  Lucid thought and speech. YHC:WCBJ:  no injection, icteris, swelling, or exudate.  EOMI, PERRLA. Mouth: lips without lesion/swelling.  Oral mucosa pink and moist. Oropharynx without erythema, exudate, or swelling.  CV: RRR, no m/r/g.   LUNGS: CTA bilat, nonlabored resps, good aeration in all lung fields. EXT: no clubbing, cyanosis, or edema.  No cogwheeling/rigidity. Neuro: CN 2-12 intact bilaterally, strength 5/5 in proximal and distal upper extremities and lower extremities bilaterally.    No tremor. FNF intact bilat.  No ataxia.  Upper extremity and lower extremity  DTRs symmetric.  No pronator drift.  Pertinent labs:  Lab Results  Component Value Date   WBC 6.5 10/11/2014   HGB 15.9 10/11/2014   HCT 47.2 10/11/2014   MCV 97.2 10/11/2014   PLT 274.0 10/11/2014     Chemistry      Component Value Date/Time   NA 136 10/11/2014 1232   K 4.7 10/11/2014 1232   CL 103 10/11/2014 1232   CO2 29 10/11/2014 1232   BUN 16 10/11/2014 1232   CREATININE 1.04 10/11/2014 1232      Component Value Date/Time   CALCIUM 9.9 10/11/2014 1232   ALKPHOS 84 10/11/2014 1232   AST 23 10/11/2014 1232   ALT 16 10/11/2014 1232   BILITOT 0.7 10/11/2014 1232     Lab Results  Component Value Date   CHOL 168 10/22/2014   HDL 53.10 10/22/2014   LDLCALC 97 10/22/2014   TRIG 91.0 10/22/2014   CHOLHDL 3 10/22/2014   ASSESSMENT AND PLAN:   1) Dementia; progressing.   Continue exelon patch, keep next f/u with [facility] neuro 12/2014.  2) Orthostatic dizziness: much improved. He will continue rapaflo at 4mg  qd dosing (1/2 of 8 mg TAB) for the next 2 wks or so, but when he runs out of these samples his wife plans on trying to restart 8 mg qd dosing and see if he tolerates it better than before. If not, she'll need a rx for 4mg  rapaflo tabs b/c the 8mg  dosing she will be starting him back on will be CAPSULES and cannot be split.  She'll request rx as needed.  3) BPH with lower urinary tract sx's/obstruction; responds well to rapaflo 4mg , but better  on 8 mg dosing (but this dosing led to excessive orthostatic dizziness. He is not even sure whether he is better off on this med compared to when he was using uroXatral but his wife thinks he is.    4) Screening for hyperlipidemia: FLP today.  5) Preventative health care: Zostavax rx printed for pt today.  6) CRI stage II: stable on recent blood testing.  An After Visit Summary was printed and given to the patient.  FOLLOW UP:  Return in about 6 months (around 04/24/2015) for routine chronic illness f/u.

## 2014-10-22 NOTE — Progress Notes (Signed)
Pre visit review using our clinic review tool, if applicable. No additional management support is needed unless otherwise documented below in the visit note. 

## 2014-11-05 ENCOUNTER — Ambulatory Visit (INDEPENDENT_AMBULATORY_CARE_PROVIDER_SITE_OTHER): Payer: Medicare Other | Admitting: Nurse Practitioner

## 2014-11-05 ENCOUNTER — Encounter: Payer: Self-pay | Admitting: Nurse Practitioner

## 2014-11-05 VITALS — BP 127/72 | HR 61 | Temp 98.1°F | Resp 18 | Ht 67.5 in | Wt 148.0 lb

## 2014-11-05 DIAGNOSIS — H1089 Other conjunctivitis: Secondary | ICD-10-CM

## 2014-11-05 DIAGNOSIS — A499 Bacterial infection, unspecified: Secondary | ICD-10-CM | POA: Diagnosis not present

## 2014-11-05 DIAGNOSIS — H109 Unspecified conjunctivitis: Secondary | ICD-10-CM | POA: Insufficient documentation

## 2014-11-05 MED ORDER — OFLOXACIN 0.3 % OP SOLN: 2.0000 [drp] | OPHTHALMIC | Status: DC

## 2014-11-05 NOTE — Patient Instructions (Signed)
Use drops for 7 days. Return in 1 week for re-evaluation. Wash lashes with diluted [employer] baby shampoo-1:1 solution daily.   Bacterial Conjunctivitis Bacterial conjunctivitis, commonly called pink eye, is an inflammation of the clear membrane that covers the white part of the eye (conjunctiva). The inflammation can also happen on the underside of the eyelids. The blood vessels in the conjunctiva become inflamed causing the eye to become red or pink. Bacterial conjunctivitis may spread easily from one eye to another and from person to person (contagious).  CAUSES  Bacterial conjunctivitis is caused by bacteria. The bacteria may come from your own skin, your upper respiratory tract, or from someone else with bacterial conjunctivitis. SYMPTOMS  The normally white color of the eye or the underside of the eyelid is usually pink or red. The pink eye is usually associated with irritation, tearing, and some sensitivity to light. Bacterial conjunctivitis is often associated with a thick, yellowish discharge from the eye. The discharge may turn into a crust on the eyelids overnight, which causes your eyelids to stick together. If a discharge is present, there may also be some blurred vision in the affected eye. DIAGNOSIS  Bacterial conjunctivitis is diagnosed by your caregiver through an eye exam and the symptoms that you report. Your caregiver looks for changes in the surface tissues of your eyes, which may point to the specific type of conjunctivitis. A sample of any discharge may be collected on a cotton-tip swab if you have a severe case of conjunctivitis, if your cornea is affected, or if you keep getting repeat infections that do not respond to treatment. The sample will be sent to a lab to see if the inflammation is caused by a bacterial infection and to see if the infection will respond to antibiotic medicines. TREATMENT   Bacterial conjunctivitis is treated with antibiotics. Antibiotic eyedrops  are most often used. However, antibiotic ointments are also available. Antibiotics pills are sometimes used. Artificial tears or eye washes may ease discomfort. HOME CARE INSTRUCTIONS   To ease discomfort, apply a cool, clean wash cloth to your eye for 10 20 minutes, 3 4 times a day.  Gently wipe away any drainage from your eye with a warm, wet washcloth or a cotton ball.  Wash your hands often with soap and water. Use paper towels to dry your hands.  Do not share towels or wash cloths. This may spread the infection.  Change or wash your pillow case every day.  You should not use eye makeup until the infection is gone.  Do not operate machinery or drive if your vision is blurred.  Stop using contacts lenses. Ask your caregiver how to sterilize or replace your contacts before using them again. This depends on the type of contact lenses that you use.  When applying medicine to the infected eye, do not touch the edge of your eyelid with the eyedrop bottle or ointment tube. SEEK IMMEDIATE MEDICAL CARE IF:   Your infection has not improved within 3 days after beginning treatment.  You had yellow discharge from your eye and it returns.  You have increased eye pain.  Your eye redness is spreading.  Your vision becomes blurred.  You have a fever or persistent symptoms for more than 2 3 days.  You have a fever and your symptoms suddenly get worse.  You have facial pain, redness, or swelling. MAKE SURE YOU:   Understand these instructions.  Will watch your condition.  Will get help right away  if you are not doing well or get worse. Document Released: 05/17/2005 Document Revised: 02/09/2012 Document Reviewed: 10/18/2011 [facility] Patient Information 2014 [place], [place].

## 2014-11-05 NOTE — Progress Notes (Signed)
   Subjective:    Patient ID: UTAH DELAUDER, male    DOB: 01/24/1924, 81 y.o.   MRN: [id number]  HPI Comments: Accompanied by wife today. Pt very hard of hearing, although wearing bilat hearing aids.  Eye Problem  The left eye is affected. This is a new problem. The current episode started 1 to 4 weeks ago (1 wk). The problem occurs constantly. The problem has been unchanged. The injury mechanism was a foreign body (wife states she pulled 3 mm hairlike odject from inner upper lid yesterday). The pain is mild. There is no known exposure to pink eye. He does not wear contacts. Associated symptoms include blurred vision (intermittent, depends on drainage), an eye discharge, eye redness and a foreign body sensation. Pertinent negatives include no double vision, fever, itching or recent URI. He has tried eye drops (using moisture drops, hot compresses) for the symptoms. The treatment provided no relief.      Review of Systems  Constitutional: Negative for fever.  Eyes: Positive for blurred vision (intermittent, depends on drainage), discharge and redness. Negative for double vision.  Skin: Negative for itching.       Objective:   Physical Exam  Constitutional: He is oriented to person, place, and time. He appears well-developed and well-nourished. No distress.  Lost 4 lbs since last OV-1 mo ago  HENT:  Head: Normocephalic and atraumatic.  Eyes: Conjunctivae and EOM are normal. Pupils are equal, round, and reactive to light. Lids are everted and swept, no foreign bodies found. Left eye exhibits discharge and exudate. Left eye exhibits no hordeolum. No foreign body present in the left eye. Right conjunctiva is not injected. Left conjunctiva is not injected. No scleral icterus.    Neck: Normal range of motion.  Cardiovascular: Normal rate.   Pulmonary/Chest: Effort normal.  Neurological: He is alert and oriented to person, place, and time.  Skin: Skin is warm and dry.  Psychiatric: He  has a normal mood and affect. His behavior is normal. Thought content normal.  Vitals reviewed.         Assessment & Plan:  1. Bacterial conjunctivitis of left eye Likely secondary to foreign object in lid, no object found on exam today - ofloxacin (OCUFLOX) 0.3 % ophthalmic solution; Place 2 drops into the left eye every 4 (four) hours. Use for 7 days  Dispense: 5 mL; Refill: 0 Wash lids & lashes w/baby shampoo See directions. F/u 1 wk, if no improvement will perform slit lamp exam

## 2014-11-05 NOTE — Progress Notes (Signed)
Pre visit review using our clinic review tool, if applicable. No additional management support is needed unless otherwise documented below in the visit note. 

## 2014-11-11 ENCOUNTER — Telehealth: Payer: Self-pay | Admitting: Nurse Practitioner

## 2014-11-11 NOTE — Telephone Encounter (Signed)
Lids still crusted, eye not red.  Likely blepharitis rather than bact conjunctivitis. Reinforced instructions for cleaing lid & lashes-use q tip across lashes, warm compress several times daily. Offered to call in erthromycin ointment, wants to give few more days w/more vigorous eye care.

## 2014-12-19 ENCOUNTER — Telehealth: Payer: Self-pay | Admitting: Family Medicine

## 2014-12-19 NOTE — Telephone Encounter (Signed)
Patient's wife dropped off paperwork last week. She is checking to see if it is ready to pick up.

## 2014-12-20 NOTE — Telephone Encounter (Signed)
Form completed. Spoke to pts wife she requested that form be faxed to number circled on form, then put up front for her to p/u at pts next ov. Form faxed, copy made for chart, and original put up front for p/u.

## 2015-01-20 ENCOUNTER — Ambulatory Visit (INDEPENDENT_AMBULATORY_CARE_PROVIDER_SITE_OTHER): Payer: Medicare Other | Admitting: Family Medicine

## 2015-01-20 ENCOUNTER — Encounter: Payer: Self-pay | Admitting: Family Medicine

## 2015-01-20 VITALS — BP 106/73 | HR 58 | Temp 97.7°F | Resp 18 | Ht 67.75 in | Wt 150.0 lb

## 2015-01-20 DIAGNOSIS — J309 Allergic rhinitis, unspecified: Secondary | ICD-10-CM | POA: Diagnosis not present

## 2015-01-20 MED ORDER — FLUTICASONE PROPIONATE 50 MCG/ACT NA SUSP: 2.0000 | Freq: Every day | NASAL | Status: DC

## 2015-01-20 NOTE — Progress Notes (Signed)
Pre visit review using our clinic review tool, if applicable. No additional management support is needed unless otherwise documented below in the visit note. 

## 2015-01-20 NOTE — Patient Instructions (Signed)
Upper Respiratory Infection, Adult An upper respiratory infection (URI) is also sometimes known as the common cold. The upper respiratory tract includes the nose, sinuses, throat, trachea, and bronchi. Bronchi are the airways leading to the lungs. Most people improve within 1 week, but symptoms can last up to 2 weeks. A residual cough may last even longer.  CAUSES Many different viruses can infect the tissues lining the upper respiratory tract. The tissues become irritated and inflamed and often become very moist. Mucus production is also common. A cold is contagious. You can easily spread the virus to others by oral contact. This includes kissing, sharing a glass, coughing, or sneezing. Touching your mouth or nose and then touching a surface, which is then touched by another person, can also spread the virus. SYMPTOMS  Symptoms typically develop 1 to 3 days after you come in contact with a cold virus. Symptoms vary from person to person. They may include:  Runny nose.  Sneezing.  Nasal congestion.  Sinus irritation.  Sore throat.  Loss of voice (laryngitis).  Cough.  Fatigue.  Muscle aches.  Loss of appetite.  Headache.  Low-grade fever. DIAGNOSIS  You might diagnose your own cold based on familiar symptoms, since most people get a cold 2 to 3 times a year. Your caregiver can confirm this based on your exam. Most importantly, your caregiver can check that your symptoms are not due to another disease such as strep throat, sinusitis, pneumonia, asthma, or epiglottitis. Blood tests, throat tests, and X-rays are not necessary to diagnose a common cold, but they may sometimes be helpful in excluding other more serious diseases. Your caregiver will decide if any further tests are required. RISKS AND COMPLICATIONS  You may be at risk for a more severe case of the common cold if you smoke cigarettes, have chronic heart disease (such as heart failure) or lung disease (such as asthma), or if  you have a weakened immune system. The very young and very old are also at risk for more serious infections. Bacterial sinusitis, middle ear infections, and bacterial pneumonia can complicate the common cold. The common cold can worsen asthma and chronic obstructive pulmonary disease (COPD). Sometimes, these complications can require emergency medical care and may be life-threatening. PREVENTION  The best way to protect against getting a cold is to practice good hygiene. Avoid oral or hand contact with people with cold symptoms. Wash your hands often if contact occurs. There is no clear evidence that vitamin C, vitamin E, echinacea, or exercise reduces the chance of developing a cold. However, it is always recommended to get plenty of rest and practice good nutrition. TREATMENT  Treatment is directed at relieving symptoms. There is no cure. Antibiotics are not effective, because the infection is caused by a virus, not by bacteria. Treatment may include:  Increased fluid intake. Sports drinks offer valuable electrolytes, sugars, and fluids.  Breathing heated mist or steam (vaporizer or shower).  Eating chicken soup or other clear broths, and maintaining good nutrition.  Getting plenty of rest.  Using gargles or lozenges for comfort.  Controlling fevers with ibuprofen or acetaminophen as directed by your caregiver.  Increasing usage of your inhaler if you have asthma. Zinc gel and zinc lozenges, taken in the first 24 hours of the common cold, can shorten the duration and lessen the severity of symptoms. Pain medicines may help with fever, muscle aches, and throat pain. A variety of non-prescription medicines are available to treat congestion and runny nose. Your caregiver   can make recommendations and may suggest nasal or lung inhalers for other symptoms.  HOME CARE INSTRUCTIONS   Only take over-the-counter or prescription medicines for pain, discomfort, or fever as directed by your  caregiver.  Use a warm mist humidifier or inhale steam from a shower to increase air moisture. This may keep secretions moist and make it easier to breathe.  Drink enough water and fluids to keep your urine clear or pale yellow.  Rest as needed.  Return to work when your temperature has returned to normal or as your caregiver advises. You may need to stay home longer to avoid infecting others. You can also use a face mask and careful hand washing to prevent spread of the virus. SEEK MEDICAL CARE IF:   After the first few days, you feel you are getting worse rather than better.  You need your caregiver's advice about medicines to control symptoms.  You develop chills, worsening shortness of breath, or brown or red sputum. These may be signs of pneumonia.  You develop yellow or brown nasal discharge or pain in the face, especially when you bend forward. These may be signs of sinusitis.  You develop a fever, swollen neck glands, pain with swallowing, or white areas in the back of your throat. These may be signs of strep throat. SEEK IMMEDIATE MEDICAL CARE IF:   You have a fever.  You develop severe or persistent headache, ear pain, sinus pain, or chest pain.  You develop wheezing, a prolonged cough, cough up blood, or have a change in your usual mucus (if you have chronic lung disease).  You develop sore muscles or a stiff neck. Document Released: 11/10/2000 Document Revised: 08/09/2011 Document Reviewed: 08/22/2013 [facility] Patient Information 2015 [place], [place]. This information is not intended to replace advice given to you by your health care provider. Make sure you discuss any questions you have with your health care provider.   flonase, nasal saline 2-3 times a day. Rest, hydrate. Mucinex if needed. If worsening or no improvement in 7-10 days, please return.

## 2015-01-20 NOTE — Progress Notes (Signed)
Subjective:    Patient ID: Mitchell Abbott, male    DOB: May 20, 1924, 81 y.o.   MRN: [id number]  HPI  Dry cough: Patient presents for office visit for a 4 day history of dry cough, with his daughter today. History is obtained by both daughter and patient, as he is severely hard of hearing. They deny any fever, chills, nausea, vomiting, sore throat or rash. He is eating okay, but not a full appetite. Patient has recently traveled to [place] and was around a lot of family members, unknown if anybody was ill at that time. He denies any headache or rhinorrhea, admits to some congestion. He recently had a car ride back for approximately 7-8 hours, with multiple stops. Patient does not have a clotting disorder. Patient is staying alone currently, his wife is out of the country. His daughter is leaving within the next day, but he has a son that lives in [place] that is coming to stay with him. There is reports mild cognitive deficit, and severe heart hearing, but daughter states that he functions well his own.   Former Smoker  Past Medical History  Diagnosis Date  . Hearing deficit     Bilateral; noise damage in [employer]  Has had hearing aids since about 2008.  . Migraine headache with aura onset in teens    No migraines since about 2004  . BPH (benign prostatic hypertrophy)     History of acute urinary retention (2006)  Hx of elevated PSA (7.26) + right sided prostate nodule on DRE by urologist.  PSA did not go down with tx with antibiotics--biopsy was done and showed NO MALIGNANCY but some chronic inflammation was present--turned out related to acute cystitis (enterococcus UTI).  PSA was 1.17 in 07/2011 at urologist's (Dr. Karsten Abbott).  F/u at urol is now prn as of 08/2014.  Marland Kitchen Headache in back of head     Neurologist treated him with PT for this problem and it helped  . Alzheimer's dementia     [facility] referral as of 07/2013.  They added namenda 07/2014 due to pt's signif decline in the  prior 6 mo.  Marland Kitchen History of TIA (transient ischemic attack) @ 2008    02/2001 and 01/2006 carotid dopplers showed 50% or less stenosis.  . DDD (degenerative disc disease), cervical     C5-6, C6-7 primarily  . History of solitary pulmonary nodule     Initially noted on CT abd for his adrenal hemorrhage s/p fall from ladder 2001.  F/u CT chest 04/2000 showed that it had shrunk from 82mm to 14mm and had no malignant features.  . Adrenal hemorrhage 2001    s/p fall from ladder--a f/u CT abd showed resolution of the hemorrhage- (duodenal polyp incidentally noted;  f/u EGD by Dr. Watt Abbott 03/2000 showed small lipomatous mass in duodenum which was confirmed by biopsy.  Marland Kitchen BPPV (benign paroxysmal positional vertigo)   . History of acute cystitis 08/2009    with acute prostatitis (enterococcus faecalis)  . Actinic keratoses   . Left bundle branch hemiblock     mentioned in dx of old records 04/15/2009  . Arthralgia of knee, right 2009    exam suspicious for meniscal origin  . Skin cancer   . Chronic renal insufficiency, stage II (mild) 2015    CrCl 60's   No Known Allergies  Past Surgical History  Procedure Laterality Date  . Prostate biopsy  04/07/2005    For elevated PSA + right sided prostate  nodule on DRE by urologist:  benign biopsy.  . Tonsillectomy  preteen  . Dental      implants  . Cataract extrac     Family History  Problem Relation Age of Onset  . Breast cancer Daughter   . Migraines Daughter   . Sudden death Mother 45    FH info obtained from Dr. Ovid Abbott records.     Review of Systems  Constitutional: Positive for appetite change and fatigue. Negative for fever, chills, diaphoresis, activity change and unexpected weight change.  HENT: Positive for congestion. Negative for ear discharge, ear pain, facial swelling, postnasal drip, sinus pressure, sore throat and voice change.   Eyes: Negative for pain, redness and itching.  Respiratory: Negative for cough, choking, shortness of  breath and wheezing.   Cardiovascular: Negative for chest pain, palpitations and leg swelling.  Gastrointestinal: Negative for nausea, vomiting, diarrhea and constipation.  Genitourinary: Negative for difficulty urinating.  Musculoskeletal: Negative for myalgias and arthralgias.  Skin: Negative for rash.  Neurological: Negative for weakness and headaches.      Objective:   Physical Exam BP 106/73 mmHg  Pulse 58  Temp(Src) 97.7 F (36.5 C) (Temporal)  Resp 18  Ht 5' 7.75" (1.721 m)  Wt 150 lb (68.04 kg)  BMI 22.97 kg/m2  SpO2 97% Gen: Afebrile. No acute distress. Nontoxic in appearance, well-developed, well-nourished Caucasian male. Talkative, telling jokes. HENT: AT. [place]. Bilateral TM visualized and normal in appearance.  MMM. Bilateral nares with erythema, mild swelling. Throat without erythema or exudates.  Eyes:Pupils Equal Round Reactive to light, Extraocular movements intact,  Conjunctiva without redness or icterus.Mild chronic blepharitis like changes left eye. Neck: Supple, no lymphadenopathy, no thyromegaly CV: RRR  Chest: CTAB, no wheeze or crackles Abd: Soft. Flat. NTND. BS present. No Masses palpated.   MSK: Bilateral lower extremity without erythema, swelling, or palpable cords. Negative Homans bilateral. Neurovascularly intact distally.    Assessment & Plan:  Mitchell Abbott is a 81 y.o. male present for acute URI.  - URI: Patient appears to have an upper respiratory infection, with some mild erythema of his nasal passages. Rest, hydrate, Mucinex if needed, Flonase prescribed today. Patient encouraged to use other Nettie pot or nasal saline 2-3 times a day. If no improvement, or worsening symptoms, in 7-10 days, patient is to return to be evaluated. Discussed with daughter this is likely a viral illness, but can turn into bacterial illness if he is unable to fight it off in 7-10 days.

## 2015-01-28 DIAGNOSIS — R453 Demoralization and apathy: Secondary | ICD-10-CM | POA: Insufficient documentation

## 2015-01-28 DIAGNOSIS — G301 Alzheimer's disease with late onset: Secondary | ICD-10-CM | POA: Diagnosis not present

## 2015-01-28 DIAGNOSIS — F0281 Dementia in other diseases classified elsewhere with behavioral disturbance: Secondary | ICD-10-CM | POA: Diagnosis not present

## 2015-02-04 ENCOUNTER — Encounter: Payer: Self-pay | Admitting: Family Medicine

## 2015-02-14 ENCOUNTER — Other Ambulatory Visit: Payer: Self-pay | Admitting: Neurology

## 2015-04-18 ENCOUNTER — Encounter: Payer: Self-pay | Admitting: Family Medicine

## 2015-04-18 ENCOUNTER — Ambulatory Visit (INDEPENDENT_AMBULATORY_CARE_PROVIDER_SITE_OTHER): Payer: Medicare Other | Admitting: Family Medicine

## 2015-04-18 VITALS — BP 100/68 | HR 87 | Ht 67.0 in | Wt 152.0 lb

## 2015-04-18 DIAGNOSIS — N182 Chronic kidney disease, stage 2 (mild): Secondary | ICD-10-CM | POA: Diagnosis not present

## 2015-04-18 DIAGNOSIS — Z23 Encounter for immunization: Secondary | ICD-10-CM

## 2015-04-18 DIAGNOSIS — N400 Enlarged prostate without lower urinary tract symptoms: Secondary | ICD-10-CM | POA: Diagnosis not present

## 2015-04-18 DIAGNOSIS — F0390 Unspecified dementia without behavioral disturbance: Secondary | ICD-10-CM

## 2015-04-18 NOTE — Progress Notes (Signed)
OFFICE VISIT  04/18/2015   CC:  Chief Complaint  Patient presents with  . Follow-up   HPI:    Patient is a 81 y.o. Caucasian male who presents with his wife for 6 mo f/u chronic illnesses/med check. Feels well, no complaints. Urinating fine on uroxatrol, no signif prob with orthostatic dizziness.  We'll take rapaflo off his med list b/c this did cause too much ortho dizziness. Memory/cognition stable, and he continues to go to [facility] neuro: is now enrolled in ADMET II trial (ritalin vs placebo for emotional apathy of dementia) and wife thinks he is acting "happier" and sillier, seems to be energized a bit more than before getting on the study.  ROS: no CP, no SOB, no focal or generalized weakness, no c/o pain anywhere, no palpitations.  Past Medical History  Diagnosis Date  . Hearing deficit     Bilateral; noise damage in [employer]  Has had hearing aids since about 2008.  . Migraine headache with aura onset in teens    No migraines since about 2004  . BPH (benign prostatic hypertrophy)     History of acute urinary retention (2006)  Hx of elevated PSA (7.26) + right sided prostate nodule on DRE by urologist.  PSA did not go down with tx with antibiotics--biopsy was done and showed NO MALIGNANCY but some chronic inflammation was present--turned out related to acute cystitis (enterococcus UTI).  PSA was 1.17 in 07/2011 at urologist's (Dr. Karsten Ro).  F/u at urol is now prn as of 08/2014.  Marland Kitchen Headache in back of head     Neurologist treated him with PT for this problem and it helped  . Alzheimer's dementia     with behav disturbance and apathy.  [facility] referral as of 07/2013.  They added namenda 07/2014 due to pt's signif decline in the prior 6 mo.  Marland Kitchen History of TIA (transient ischemic attack) @ 2008    02/2001 and 01/2006 carotid dopplers showed 50% or less stenosis.  . DDD (degenerative disc disease), cervical     C5-6, C6-7 primarily  . History of solitary pulmonary nodule      Initially noted on CT abd for his adrenal hemorrhage s/p fall from ladder 2001.  F/u CT chest 04/2000 showed that it had shrunk from 58mm to 20mm and had no malignant features.  . Adrenal hemorrhage ([place]) 2001    s/p fall from ladder--a f/u CT abd showed resolution of the hemorrhage- (duodenal polyp incidentally noted;  f/u EGD by Dr. Watt Climes 03/2000 showed small lipomatous mass in duodenum which was confirmed by biopsy.  Marland Kitchen BPPV (benign paroxysmal positional vertigo)   . History of acute cystitis 08/2009    with acute prostatitis (enterococcus faecalis)  . Actinic keratoses   . Left bundle branch hemiblock     mentioned in dx of old records 04/15/2009  . Arthralgia of knee, right 2009    exam suspicious for meniscal origin  . Skin cancer   . Chronic renal insufficiency, stage II (mild) 2015    CrCl 60's    Past Surgical History  Procedure Laterality Date  . Prostate biopsy  04/07/2005    For elevated PSA + right sided prostate nodule on DRE by urologist:  benign biopsy.  . Tonsillectomy  preteen  . Dental      implants  . Cataract extrac     MEDS: pt not taking flonase, claritin, or rapaflo listed below Outpatient Prescriptions Prior to Visit  Medication Sig Dispense Refill  .  alfuzosin (UROXATRAL) 10 MG 24 hr tablet TAKE 1 TABLET DAILY 90 tablet 1  . aspirin 325 MG EC tablet Take 1 tablet (325 mg total) by mouth daily. 90 tablet 3  . CALCIUM PO Take 300 mg by mouth daily.    . Cholecalciferol 4000 UNITS CAPS Take 4,000 Units by mouth daily.    . cyanocobalamin 2000 MCG tablet Take 2,000 mcg by mouth daily.    . EXELON 9.5 MG/24HR APPLY 1 PATCH DAILY 30 patch 0  . Glucosamine HCl (GLUCOSAMINE PO) Take by mouth daily.    Marland Kitchen MAGNESIUM PO Take by mouth daily.    . memantine (NAMENDA) 10 MG tablet Take 10 mg by mouth 2 (two) times daily. Per [facility] memory [facility] (Dr. Susa Griffins)    . Multiple Vitamins-Minerals (MULTIVITAMIN PO) Take by mouth daily.    Marland Kitchen zoster vaccine live, PF, (ZOSTAVAX) 60454  UNT/0.65ML injection Inject 19,400 Units into the skin once. (Patient not taking: Reported on 04/18/2015) 1 vial 0  . fluticasone (FLONASE) 50 MCG/ACT nasal spray Place 2 sprays into both nostrils daily. (Patient not taking: Reported on 04/18/2015) 16 g 6  . loratadine (CLARITIN) 10 MG tablet Take 10 mg by mouth daily.    Marland Kitchen ofloxacin (OCUFLOX) 0.3 % ophthalmic solution Place 2 drops into the left eye every 4 (four) hours. Use for 7 days (Patient not taking: Reported on 04/18/2015) 5 mL 0  . silodosin (RAPAFLO) 4 MG CAPS capsule Take 4 mg by mouth daily with breakfast.     No facility-administered medications prior to visit.    No Known Allergies  ROS As per HPI  PE: Blood pressure 100/68, pulse 87, height 5\' 7"  (1.702 m), weight 152 lb (68.947 kg), SpO2 94 %. Gen: Alert, well appearing.  Patient is oriented to person, general place, general situation.  Hard of hearing. [id number]: no injection, icteris, swelling, or exudate.  EOMI, PERRLA. Mouth: lips without lesion/swelling.  Oral mucosa pink and moist. Oropharynx without erythema, exudate, or swelling.  CV: RRR, no m/r/g.   LUNGS: CTA bilat, nonlabored resps, good aeration in all lung fields. EXT: no clubbing, cyanosis, or edema.   LABS:  Lab Results  Component Value Date   CHOL 168 10/22/2014   HDL 53.10 10/22/2014   LDLCALC 97 10/22/2014   TRIG 91.0 10/22/2014   CHOLHDL 3 10/22/2014     Chemistry      Component Value Date/Time   NA 136 10/11/2014 1232   K 4.7 10/11/2014 1232   CL 103 10/11/2014 1232   CO2 29 10/11/2014 1232   BUN 16 10/11/2014 1232   CREATININE 1.04 10/11/2014 1232      Component Value Date/Time   CALCIUM 9.9 10/11/2014 1232   ALKPHOS 84 10/11/2014 1232   AST 23 10/11/2014 1232   ALT 16 10/11/2014 1232   BILITOT 0.7 10/11/2014 1232     Lab Results  Component Value Date   WBC 6.5 10/11/2014   HGB 15.9 10/11/2014   HCT 47.2 10/11/2014   MCV 97.2 10/11/2014   PLT 274.0 10/11/2014     IMPRESSION AND PLAN:  1) Dementia w/out behav disturbance: The current medical regimen is effective;  continue present plan and medications. Continue [facility] neuro f/u--currently in ADMET II trial and MAY be on ritalin (or placebo).  2) BPH with LUT obstructive sx's: seems to be doing fine on uroxatrol.  Continue this. Of note, pt has hx of prostate nodule and elevated PSA.  Bx neg.  PSAs came back down.  Last visit with urologist 09/23/14, urologist said no further PSA testing is needed for patient.  He is to f/u with them on prn basis.  3) CRI stage II/III: CrCl around 60 ml/min. BMET ordered--future.  Flu vaccine-high dose-given today.  An After Visit Summary was printed and given to the patient.   FOLLOW UP: Return in about 6 months (around 10/16/2015) for routine chronic illness f/u.

## 2015-04-25 ENCOUNTER — Ambulatory Visit: Payer: Medicare Other | Admitting: Family Medicine

## 2015-05-02 ENCOUNTER — Other Ambulatory Visit: Payer: Self-pay | Admitting: Family Medicine

## 2015-05-02 DIAGNOSIS — N182 Chronic kidney disease, stage 2 (mild): Secondary | ICD-10-CM | POA: Diagnosis not present

## 2015-05-02 LAB — BASIC METABOLIC PANEL
BUN: 17 mg/dL (ref 7–25)
CO2: 24 mmol/L (ref 20–31)
Calcium: 9.3 mg/dL (ref 8.6–10.3)
Chloride: 107 mmol/L (ref 98–110)
Creat: 1.12 mg/dL — ABNORMAL HIGH (ref 0.70–1.11)
Glucose, Bld: 85 mg/dL (ref 65–99)
Potassium: 4.5 mmol/L (ref 3.5–5.3)
Sodium: 140 mmol/L (ref 135–146)

## 2015-05-27 ENCOUNTER — Encounter: Payer: Self-pay | Admitting: Family Medicine

## 2015-05-27 ENCOUNTER — Ambulatory Visit (INDEPENDENT_AMBULATORY_CARE_PROVIDER_SITE_OTHER): Payer: Medicare Other | Admitting: Family Medicine

## 2015-05-27 VITALS — BP 87/59 | HR 72 | Temp 97.8°F | Resp 20 | Wt 145.8 lb

## 2015-05-27 DIAGNOSIS — J320 Chronic maxillary sinusitis: Secondary | ICD-10-CM | POA: Insufficient documentation

## 2015-05-27 DIAGNOSIS — J0100 Acute maxillary sinusitis, unspecified: Secondary | ICD-10-CM | POA: Diagnosis not present

## 2015-05-27 MED ORDER — DOXYCYCLINE HYCLATE 100 MG PO TABS: 100.0000 mg | ORAL_TABLET | Freq: Two times a day (BID) | ORAL | Status: DC

## 2015-05-27 NOTE — Patient Instructions (Signed)
Sinusitis, Adult Sinusitis is redness, soreness, and inflammation of the paranasal sinuses. Paranasal sinuses are air pockets within the bones of your face. They are located beneath your eyes, in the middle of your forehead, and above your eyes. In healthy paranasal sinuses, mucus is able to drain out, and air is able to circulate through them by way of your nose. However, when your paranasal sinuses are inflamed, mucus and air can become trapped. This can allow bacteria and other germs to grow and cause infection. Sinusitis can develop quickly and last only a short time (acute) or continue over a long period (chronic). Sinusitis that lasts for more than 12 weeks is considered chronic. CAUSES Causes of sinusitis include:  Allergies.  Structural abnormalities, such as displacement of the cartilage that separates your nostrils (deviated septum), which can decrease the air flow through your nose and sinuses and affect sinus drainage.  Functional abnormalities, such as when the small hairs (cilia) that line your sinuses and help remove mucus do not work properly or are not present. SIGNS AND SYMPTOMS Symptoms of acute and chronic sinusitis are the same. The primary symptoms are pain and pressure around the affected sinuses. Other symptoms include:  Upper toothache.  Earache.  Headache.  Bad breath.  Decreased sense of smell and taste.  A cough, which worsens when you are lying flat.  Fatigue.  Fever.  Thick drainage from your nose, which often is green and may contain pus (purulent).  Swelling and warmth over the affected sinuses. DIAGNOSIS Your health care provider will perform a physical exam. During your exam, your health care provider may perform any of the following to help determine if you have acute sinusitis or chronic sinusitis:  Look in your nose for signs of abnormal growths in your nostrils (nasal polyps).  Tap over the affected sinus to check for signs of  infection.  View the inside of your sinuses using an imaging device that has a light attached (endoscope). If your health care provider suspects that you have chronic sinusitis, one or more of the following tests may be recommended:  Allergy tests.  Nasal culture. A sample of mucus is taken from your nose, sent to a lab, and screened for bacteria.  Nasal cytology. A sample of mucus is taken from your nose and examined by your health care provider to determine if your sinusitis is related to an allergy. TREATMENT Most cases of acute sinusitis are related to a viral infection and will resolve on their own within 10 days. Sometimes, medicines are prescribed to help relieve symptoms of both acute and chronic sinusitis. These may include pain medicines, decongestants, nasal steroid sprays, or saline sprays. However, for sinusitis related to a bacterial infection, your health care provider will prescribe antibiotic medicines. These are medicines that will help kill the bacteria causing the infection. Rarely, sinusitis is caused by a fungal infection. In these cases, your health care provider will prescribe antifungal medicine. For some cases of chronic sinusitis, surgery is needed. Generally, these are cases in which sinusitis recurs more than 3 times per year, despite other treatments. HOME CARE INSTRUCTIONS  Drink plenty of water. Water helps thin the mucus so your sinuses can drain more easily.  Use a humidifier.  Inhale steam 3-4 times a day (for example, sit in the bathroom with the shower running).  Apply a warm, moist washcloth to your face 3-4 times a day, or as directed by your health care provider.  Use saline nasal sprays to help   moisten and clean your sinuses.  Take medicines only as directed by your health care provider.  If you were prescribed either an antibiotic or antifungal medicine, finish it all even if you start to feel better. SEEK IMMEDIATE MEDICAL CARE IF:  You have  increasing pain or severe headaches.  You have nausea, vomiting, or drowsiness.  You have swelling around your face.  You have vision problems.  You have a stiff neck.  You have difficulty breathing.   This information is not intended to replace advice given to you by your health care provider. Make sure you discuss any questions you have with your health care provider.   Document Released: 05/17/2005 Document Revised: 06/07/2014 Document Reviewed: 06/01/2011 Elsevier Interactive Patient Education 2016 [place], hydrate, doxycycline for ten days, humidifier. Try nasal saline and flonase as well.

## 2015-05-27 NOTE — Progress Notes (Signed)
Subjective:    Patient ID: Mitchell Abbott, male    DOB: June 29, 1923, 81 y.o.   MRN: [id number]  HPI  URI-like symptoms: History is obtained from both patient and his wife, patient is extremely hard of hearing. 1 week ago after being his the gym this patient and his wife drove home with the windows down. Since that time noth have benn battling URI like symptoms. She reports she is not complaining too much, but does complain of a mild sore throat and sinus pressure. She states he's having a cough as well. He has not had any fevers or chills. He does have less of an appetite, but she states he's eating and drinking well. He also endorses nasal congestion and ear pressure. He denies nausea, vomit, dizziness, diarrhea or rash. He is up-to-date with his flu and tetanus vaccinations. He does have chronic kidney disease, recent GFR 58. Former smoker Past Medical History  Diagnosis Date  . Hearing deficit     Bilateral; noise damage in [employer]  Has had hearing aids since about 2008.  . Migraine headache with aura onset in teens    No migraines since about 2004  . BPH (benign prostatic hypertrophy)     History of acute urinary retention (2006)  Hx of elevated PSA (7.26) + right sided prostate nodule on DRE by urologist.  PSA did not go down with tx with antibiotics--biopsy was done and showed NO MALIGNANCY but some chronic inflammation was present--turned out related to acute cystitis (enterococcus UTI).  PSA was 1.17 in 07/2011 at urologist's (Dr. Karsten Ro).  F/u at urol is now prn as of 08/2014.  Marland Kitchen Headache in back of head     Neurologist treated him with PT for this problem and it helped  . Alzheimer's dementia     with behav disturbance and apathy.  [facility] referral as of 07/2013.  They added namenda 07/2014 due to pt's signif decline in the prior 6 mo.  Marland Kitchen History of TIA (transient ischemic attack) @ 2008    02/2001 and 01/2006 carotid dopplers showed 50% or less stenosis.  . DDD  (degenerative disc disease), cervical     C5-6, C6-7 primarily  . History of solitary pulmonary nodule     Initially noted on CT abd for his adrenal hemorrhage s/p fall from ladder 2001.  F/u CT chest 04/2000 showed that it had shrunk from 41mm to 64mm and had no malignant features.  . Adrenal hemorrhage ([place]) 2001    s/p fall from ladder--a f/u CT abd showed resolution of the hemorrhage- (duodenal polyp incidentally noted;  f/u EGD by Dr. Watt Climes 03/2000 showed small lipomatous mass in duodenum which was confirmed by biopsy.  Marland Kitchen BPPV (benign paroxysmal positional vertigo)   . History of acute cystitis 08/2009    with acute prostatitis (enterococcus faecalis)  . Actinic keratoses   . Left bundle branch hemiblock     mentioned in dx of old records 04/15/2009  . Arthralgia of knee, right 2009    exam suspicious for meniscal origin  . Skin cancer   . Chronic renal insufficiency, stage II (mild) 2015    CrCl 60's   No Known Allergies  Review of Systems Negative, with the exception of above mentioned in HPI     Objective:   Physical Exam BP 87/59 mmHg  Pulse 72  Temp(Src) 97.8 F (36.6 C)  Resp 20  Wt 145 lb 12 oz (66.112 kg)  SpO2 99% Gen: Afebrile. No acute  distress. Nontoxic in appearance, well-developed, well-nourished, extremely hard of hearing Caucasian male. HENT: AT. [place]. Bilateral TM visualized, air fluid levels present, no erythema, no bulging MMM no oral lesions. Bilateral nares mild erythema and swelling. Throat without erythema or exudates. Cough present on exam. Hoarseness present on exam. Tenderness to palpation bilateral maxillary sinuses Eyes:Pupils Equal Round Reactive to light, Extraocular movements intact,  Conjunctiva without redness, discharge or icterus. Neck/lymp/endocrine: Supple, mild shotty anterior cervical lymphadenopathy CV: RRR  Chest: CTAB, no wheeze or crackles Abd: Soft.NTND. BS present Skin: No rashes, purpura or petechiae.  Neuro: Normal gait. PERLA.  EOMi. Alert. Oriented x3    Assessment & Plan:  1. Acute maxillary sinusitis, recurrence not specified - Discussed lower blood pressure on visit today, patient needs to maintain good hydration. Does not appear toxic today. Asymptomatic. - Doxycycline, Flonase, rest, hydration and humidifier use encouraged. - doxycycline (VIBRA-TABS) 100 MG tablet; Take 1 tablet (100 mg total) by mouth 2 (two) times daily.  Dispense: 20 tablet; Refill: 0 Follow-up when necessary, or if conditions worsens.

## 2015-06-10 ENCOUNTER — Other Ambulatory Visit: Payer: Self-pay | Admitting: Family Medicine

## 2015-06-10 NOTE — Telephone Encounter (Signed)
RF request for alfuzosin LOV: 04/18/15 Next ov: 10/10/15 Last written: 09/03/14 #90 w/ 1RF

## 2015-07-24 ENCOUNTER — Telehealth: Payer: Self-pay | Admitting: Family Medicine

## 2015-07-24 NOTE — Telephone Encounter (Signed)
Patient's wife would like to know if patient can take [place] Complex dietary supplement (ingredient list: saw palmetto extract, pumpkin seed oil, gylcerin, zinc etc) in place of Alfuzosin or in addition to? Please call

## 2015-07-25 NOTE — Telephone Encounter (Signed)
Please advise. Thanks.  

## 2015-08-05 DIAGNOSIS — G039 Meningitis, unspecified: Secondary | ICD-10-CM | POA: Diagnosis not present

## 2015-08-05 DIAGNOSIS — R453 Demoralization and apathy: Secondary | ICD-10-CM | POA: Diagnosis not present

## 2015-08-05 DIAGNOSIS — F0281 Dementia in other diseases classified elsewhere with behavioral disturbance: Secondary | ICD-10-CM | POA: Diagnosis not present

## 2015-08-05 DIAGNOSIS — G301 Alzheimer's disease with late onset: Secondary | ICD-10-CM | POA: Diagnosis not present

## 2015-08-05 DIAGNOSIS — R42 Dizziness and giddiness: Secondary | ICD-10-CM | POA: Diagnosis not present

## 2015-08-05 DIAGNOSIS — Z79899 Other long term (current) drug therapy: Secondary | ICD-10-CM | POA: Diagnosis not present

## 2015-08-20 ENCOUNTER — Other Ambulatory Visit: Payer: Self-pay | Admitting: *Deleted

## 2015-08-20 NOTE — Telephone Encounter (Signed)
Pts wife LMOM on 08/20/15 at 11:21am requesting refill for one of pts medications. I was unable to understand which medication she was requesting refill for. I have left message for pts wife to call back.

## 2015-08-20 NOTE — Telephone Encounter (Signed)
Spoke to pts wife and she stated that pt is no longer seeing Dr. Krista Blue. She stated that he is now seeing Dr. Delle Reining at the [facility] 2 x a year.   RF request for elexon LOV: 04/18/15 Next ov: 10/10/15 Last written: 02/14/15 #30 w/ 0RF  Please advise. Thanks.

## 2015-08-21 MED ORDER — RIVASTIGMINE 9.5 MG/24HR TD PT24: MEDICATED_PATCH | TRANSDERMAL | Status: DC

## 2015-08-21 NOTE — Telephone Encounter (Signed)
Left message for pts wife to call back. 

## 2015-08-21 NOTE — Telephone Encounter (Signed)
Fax from [facility] came in requesting 90 day supply. Pts wife called office to check on status. I advised her the fax with change of qty has been sent to [facility], she voiced understanding.

## 2015-09-05 ENCOUNTER — Encounter: Payer: Self-pay | Admitting: Family Medicine

## 2015-09-05 ENCOUNTER — Ambulatory Visit (INDEPENDENT_AMBULATORY_CARE_PROVIDER_SITE_OTHER): Payer: Medicare Other | Admitting: Family Medicine

## 2015-09-05 VITALS — BP 133/82 | HR 66 | Temp 97.6°F | Resp 16 | Ht 67.0 in | Wt 149.5 lb

## 2015-09-05 DIAGNOSIS — M316 Other giant cell arteritis: Secondary | ICD-10-CM | POA: Diagnosis not present

## 2015-09-05 LAB — CBC WITH DIFFERENTIAL/PLATELET
Basophils Absolute: 0 cells/uL (ref 0–200)
Basophils Relative: 0 %
Eosinophils Absolute: 276 cells/uL (ref 15–500)
Eosinophils Relative: 4 %
HCT: 45.2 % (ref 38.5–50.0)
Hemoglobin: 15.4 g/dL (ref 13.2–17.1)
Lymphocytes Relative: 11 %
Lymphs Abs: 759 cells/uL — ABNORMAL LOW (ref 850–3900)
MCH: 33.5 pg — ABNORMAL HIGH (ref 27.0–33.0)
MCHC: 34.1 g/dL (ref 32.0–36.0)
MCV: 98.3 fL (ref 80.0–100.0)
MPV: 10.8 fL (ref 7.5–12.5)
Monocytes Absolute: 828 cells/uL (ref 200–950)
Monocytes Relative: 12 %
Neutro Abs: 5037 cells/uL (ref 1500–7800)
Neutrophils Relative %: 73 %
Platelets: 285 10*3/uL (ref 140–400)
RBC: 4.6 MIL/uL (ref 4.20–5.80)
RDW: 14.2 % (ref 11.0–15.0)
WBC: 6.9 10*3/uL (ref 3.8–10.8)

## 2015-09-05 LAB — BASIC METABOLIC PANEL
BUN: 16 mg/dL (ref 7–25)
CO2: 26 mmol/L (ref 20–31)
Calcium: 9.5 mg/dL (ref 8.6–10.3)
Chloride: 105 mmol/L (ref 98–110)
Creat: 1.02 mg/dL (ref 0.70–1.11)
Glucose, Bld: 77 mg/dL (ref 65–99)
Potassium: 4.6 mmol/L (ref 3.5–5.3)
Sodium: 140 mmol/L (ref 135–146)

## 2015-09-05 LAB — C-REACTIVE PROTEIN: CRP: <0.5 mg/dL (ref <0.60)

## 2015-09-05 MED ORDER — PREDNISONE 20 MG PO TABS: ORAL_TABLET | ORAL | Status: DC

## 2015-09-05 NOTE — Progress Notes (Signed)
OFFICE VISIT  09/05/2015   CC:  Chief Complaint  Patient presents with  . Headache    x 2 days - temple pain right side    HPI:    Patient is a 82 y.o. Caucasian male who presents for onset 2 days ago of pain in R temple.  It has been constant, possibly lessening today but not positive.  No pain on L side.  No vision abnormalities associated with this pain.  No jaw claudication.  No hx of trauma to the area.  Massaging the area makes no difference.  No fevers.  No med taken for the pain.  No c/o generalized or focal weakness.  No myalgias/arthralgias.  Past Medical History  Diagnosis Date  . Hearing deficit     Bilateral; noise damage in [employer]  Has had hearing aids since about 2008.  . Migraine headache with aura onset in teens    No migraines since about 2004  . BPH (benign prostatic hypertrophy)     History of acute urinary retention (2006)  Hx of elevated PSA (7.26) + right sided prostate nodule on DRE by urologist.  PSA did not go down with tx with antibiotics--biopsy was done and showed NO MALIGNANCY but some chronic inflammation was present--turned out related to acute cystitis (enterococcus UTI).  PSA was 1.17 in 07/2011 at urologist's (Dr. Karsten Ro).  F/u at urol is now prn as of 08/2014.  Marland Kitchen Headache in back of head     Neurologist treated him with PT for this problem and it helped  . Alzheimer's dementia     with behav disturbance and apathy.  [facility] referral as of 07/2013.  They added namenda 07/2014 due to pt's signif decline in the prior 6 mo.  Marland Kitchen History of TIA (transient ischemic attack) @ 2008    02/2001 and 01/2006 carotid dopplers showed 50% or less stenosis.  . DDD (degenerative disc disease), cervical     C5-6, C6-7 primarily  . History of solitary pulmonary nodule     Initially noted on CT abd for his adrenal hemorrhage s/p fall from ladder 2001.  F/u CT chest 04/2000 showed that it had shrunk from 22m to 661mand had no malignant features.  . [place]  [place]    s/p [place] from [place]--a f/u CT abd showed resolution of the hemorrhage- (duodenal polyp incidentally noted;  f/u EGD by Dr. MaWatt Climes1/2001 showed small lipomatous mass in duodenum which was confirmed by biopsy.  . Marland KitchenPPV (benign paroxysmal positional vertigo)   . History of acute cystitis 08/2009    with acute prostatitis (enterococcus faecalis)  . Actinic keratoses   . Left bundle branch hemiblock     mentioned in dx of old records 04/15/2009  . Arthralgia of knee, right 2009    exam suspicious for meniscal origin  . Skin cancer   . Chronic renal insufficiency, stage II (mild) 2015    CrCl 60's    Past Surgical History  Procedure Laterality Date  . Prostate biopsy  04/07/2005    For elevated PSA + right sided prostate nodule on DRE by urologist:  benign biopsy.  . Tonsillectomy  preteen  . Dental      implants  . Cataract extrac      Outpatient Prescriptions Prior to Visit  Medication Sig Dispense Refill  . alfuzosin (UROXATRAL) 10 MG 24 hr tablet TAKE 1 TABLET DAILY 90 tablet 1  . aspirin 325 MG EC tablet Take 1 tablet (325 mg  total) by mouth daily. 90 tablet 3  . CALCIUM PO Take 300 mg by mouth daily.    . Cholecalciferol 4000 UNITS CAPS Take 4,000 Units by mouth daily.    . cyanocobalamin 2000 MCG tablet Take 2,000 mcg by mouth daily.    . Glucosamine HCl (GLUCOSAMINE PO) Take by mouth daily.    Marland Kitchen MAGNESIUM PO Take by mouth daily.    . memantine (NAMENDA) 10 MG tablet Take 10 mg by mouth 2 (two) times daily. Per [facility] memory clinic (Dr. Susa Griffins)    . Omega-3 Fatty Acids (OMEGA 3 PO) Take by mouth.    . rivastigmine (EXELON) 9.5 mg/24hr APPLY 1 PATCH DAILY 30 patch 6  . doxycycline (VIBRA-TABS) 100 MG tablet Take 1 tablet (100 mg total) by mouth 2 (two) times daily. (Patient not taking: Reported on 09/05/2015) 20 tablet 0  . Misc Natural Products (FOCUSED MIND PO) Take by mouth. Reported on 09/05/2015    . Multiple Vitamins-Minerals (MULTIVITAMIN PO) Take by  mouth daily. Reported on 09/05/2015    . zoster vaccine live, PF, (ZOSTAVAX) 01586 UNT/0.65ML injection Inject 19,400 Units into the skin once. (Patient not taking: Reported on 04/18/2015) 1 vial 0   No facility-administered medications prior to visit.    No Known Allergies  ROS As per HPI  PE: Blood pressure 133/82, pulse 66, temperature 97.6 F (36.4 C), temperature source Oral, resp. rate 16, height 5' 7"  (1.702 m), weight 149 lb 8 oz (67.813 kg), SpO2 97 %. Gen: Alert, well appearing.  Patient is oriented to person, place, time, and situation. AFFECT: pleasantly demented. R upper temple area with focal TTP---pulsation of artery can be palpated   LABS:  None today  IMPRESSION AND PLAN:  1) Temple pain; my suspicion for temporal arteritis is moderately high, so I'll start prednisone 65m qd x 4d, 414mqd x 3d, 2058md x 2d.  Will check CBC, BMET, ESR, and CRP today.  See pt back in office in 3-4 d to check response. I discussed the case with Dr. ShoWilburn [facility] today and he felt like the plan was good and said if there is poor response to the prednisone or there is some other reason for a specialist to get involved, the first appropriate specialist would be a rheumatologist.  ENT would get involved if TA biopsy was felt to be needed by the rheumatologist. This was all explained to the patient and his wife and there questions were answered to the best of my ability today.  An After Visit Summary was printed and given to the patient.  FOLLOW UP: Return for f/u 3-4 days (30 min).  Signed:  PhiCrissie SicklesD           09/05/2015

## 2015-09-05 NOTE — Progress Notes (Signed)
Pre visit review using our clinic review tool, if applicable. No additional management support is needed unless otherwise documented below in the visit note. 

## 2015-09-08 ENCOUNTER — Encounter: Payer: Self-pay | Admitting: Family Medicine

## 2015-09-08 ENCOUNTER — Ambulatory Visit (INDEPENDENT_AMBULATORY_CARE_PROVIDER_SITE_OTHER): Payer: Medicare Other | Admitting: Family Medicine

## 2015-09-08 VITALS — BP 133/84 | HR 61 | Temp 97.8°F | Resp 16 | Ht 67.0 in | Wt 147.2 lb

## 2015-09-08 DIAGNOSIS — M316 Other giant cell arteritis: Secondary | ICD-10-CM | POA: Diagnosis not present

## 2015-09-08 LAB — SEDIMENTATION RATE

## 2015-09-08 NOTE — Progress Notes (Signed)
OFFICE VISIT  09/08/2015   CC:  Chief Complaint  Patient presents with  . Follow-up    Temple Pain    HPI:    Patient is a 82 y.o. Caucasian male who presents for 3 day f/u suspected temporal arteritis. Started prednisone 30m qd.  HA started getting better the first night on prednisone. No new sx's or significant side effects from steroids. He says he doesn't feel any R temple pain today. Unfortunately, the lab did not run the ESR last time I saw him b/c not enough blood, but CRP was done and it was normal.  Past Medical History  Diagnosis Date  . Hearing deficit     Bilateral; noise damage in [employer]  Has had hearing aids since about 2008.  . Migraine headache with aura onset in teens    No migraines since about 2004  . BPH (benign prostatic hypertrophy)     History of acute urinary retention (2006)  Hx of elevated PSA (7.26) + right sided prostate nodule on DRE by urologist.  PSA did not go down with tx with antibiotics--biopsy was done and showed NO MALIGNANCY but some chronic inflammation was present--turned out related to acute cystitis (enterococcus UTI).  PSA was 1.17 in 07/2011 at urologist's (Dr. OKarsten Ro.  F/u at urol is now prn as of 08/2014.  .Marland KitchenHeadache in back of head     Neurologist treated him with PT for this problem and it helped  . Alzheimer's dementia     with behav disturbance and apathy.  [facility] referral as of 07/2013.  They added namenda 07/2014 due to pt's signif decline in the prior 6 mo.  .Marland KitchenHistory of TIA (transient ischemic attack) @ 2008    02/2001 and 01/2006 carotid dopplers showed 50% or less stenosis.  . DDD (degenerative disc disease), cervical     C5-6, C6-7 primarily  . History of solitary pulmonary nodule     Initially noted on CT abd for his adrenal hemorrhage s/p fall from ladder 2001.  F/u CT chest 04/2000 showed that it had shrunk from 1566mto 66m58mnd had no malignant features.  . [place] [place]    s/p fall from  ladder--a f/u CT abd showed resolution of the hemorrhage- (duodenal polyp incidentally noted;  f/u EGD by Dr. MagWatt Climes/2001 showed small lipomatous mass in duodenum which was confirmed by biopsy.  . BMarland KitchenPV (benign paroxysmal positional vertigo)   . History of acute cystitis 08/2009    with acute prostatitis (enterococcus faecalis)  . Actinic keratoses   . Left bundle branch hemiblock     mentioned in dx of old records 04/15/2009  . Arthralgia of knee, right 2009    exam suspicious for meniscal origin  . Skin cancer   . Chronic renal insufficiency, stage II (mild) 2015    CrCl 60's    Past Surgical History  Procedure Laterality Date  . Prostate biopsy  04/07/2005    For elevated PSA + right sided prostate nodule on DRE by urologist:  benign biopsy.  . Tonsillectomy  preteen  . Dental      implants  . Cataract extrac      Outpatient Prescriptions Prior to Visit  Medication Sig Dispense Refill  . alfuzosin (UROXATRAL) 10 MG 24 hr tablet TAKE 1 TABLET DAILY 90 tablet 1  . aspirin 325 MG EC tablet Take 1 tablet (325 mg total) by mouth daily. 90 tablet 3  . BIOTIN PO Take 1 capsule by  mouth daily.    Marland Kitchen CALCIUM PO Take 300 mg by mouth daily.    . Cholecalciferol 4000 UNITS CAPS Take 4,000 Units by mouth daily.    . cyanocobalamin 2000 MCG tablet Take 2,000 mcg by mouth daily.    . Glucosamine HCl (GLUCOSAMINE PO) Take by mouth daily.    Marland Kitchen MAGNESIUM PO Take by mouth daily.    . memantine (NAMENDA) 10 MG tablet Take 10 mg by mouth 2 (two) times daily. Per [facility] memory clinic (Dr. Susa Griffins)    . Omega-3 Fatty Acids (OMEGA 3 PO) Take by mouth.    . predniSONE (DELTASONE) 20 MG tablet 3 tabs po qd x 4d, then 2 tabs po qd x 3d, then 1 tab po qd x 2d, then stop 20 tablet 0  . rivastigmine (EXELON) 9.5 mg/24hr APPLY 1 PATCH DAILY 30 patch 6   No facility-administered medications prior to visit.    No Known Allergies  ROS As per HPI  PE: Blood pressure 133/84, pulse 61, temperature 97.8 F  (36.6 C), temperature source Oral, resp. rate 16, height 5' 7"  (1.702 m), weight 147 lb 4 oz (66.792 kg), SpO2 98 %. Gen: alert, well appearing. Pleasantly demented. L temple w/out TTP. R temple with mild focal TTP over TA area  LABS:  Lab Results  Component Value Date   WBC 6.9 09/05/2015   HGB 15.4 09/05/2015   HCT 45.2 09/05/2015   MCV 98.3 09/05/2015   PLT 285 09/05/2015     Chemistry      Component Value Date/Time   NA 140 09/05/2015 1528   K 4.6 09/05/2015 1528   CL 105 09/05/2015 1528   CO2 26 09/05/2015 1528   BUN 16 09/05/2015 1528   CREATININE 1.02 09/05/2015 1528   CREATININE 1.04 10/11/2014 1232      Component Value Date/Time   CALCIUM 9.5 09/05/2015 1528   ALKPHOS 84 10/11/2014 1232   AST 23 10/11/2014 1232   ALT 16 10/11/2014 1232   BILITOT 0.7 10/11/2014 1232     Lab Results  Component Value Date   CRP <0.5 09/05/2015    IMPRESSION AND PLAN:  Temporal arteritis suspected: however, history is difficult with this man b/c he has dementia. Plan is to continue the steroid taper I rx'd last week, and if he starts getting return of R temple HA as the dose is decreased or shortly after he finishes the prednisone, we'll restart at 34m qd dosing and get him to the rheumatologist.  If sx's do NOT recur, then will take a watchful waiting approach.  Pt and pt's wife/caregiver are in agreement with this approach/plan.   An After Visit Summary was printed and given to the patient.  FOLLOW UP: Return if symptoms worsen or fail to improve.  Signed:  PCrissie Sickles MD           09/08/2015

## 2015-09-08 NOTE — Progress Notes (Signed)
Pre visit review using our clinic review tool, if applicable. No additional management support is needed unless otherwise documented below in the visit note. 

## 2015-10-02 ENCOUNTER — Telehealth: Payer: Self-pay | Admitting: Family Medicine

## 2015-10-02 NOTE — Telephone Encounter (Signed)
Patient's wife called back. Correction Layne Weaver did Rx. Medication is called Ofloxacin

## 2015-10-02 NOTE — Telephone Encounter (Signed)
Patient's eyelid is red. She is giving him eye drops that Dr. Raoul Pitch prescribed. She wants to know if that is okay. Please call

## 2015-10-02 NOTE — Telephone Encounter (Signed)
Please advise. Thanks.  

## 2015-10-02 NOTE — Telephone Encounter (Signed)
Discussed with pt's wife and with pt when he came in with wife for her appt today. His eye looked fine and it apparently felt improved after [name] had been putting 2 drops of ofloxacin in it daily for the last 4d.  I told her to give this med 3 more days then stop.

## 2015-10-08 ENCOUNTER — Ambulatory Visit (INDEPENDENT_AMBULATORY_CARE_PROVIDER_SITE_OTHER): Payer: Medicare Other | Admitting: Family Medicine

## 2015-10-08 ENCOUNTER — Encounter: Payer: Self-pay | Admitting: Family Medicine

## 2015-10-08 VITALS — BP 104/71 | HR 65 | Temp 97.8°F | Ht 67.0 in | Wt 146.8 lb

## 2015-10-08 DIAGNOSIS — F0281 Dementia in other diseases classified elsewhere with behavioral disturbance: Secondary | ICD-10-CM

## 2015-10-08 DIAGNOSIS — R51 Headache: Secondary | ICD-10-CM

## 2015-10-08 DIAGNOSIS — G309 Alzheimer's disease, unspecified: Principal | ICD-10-CM

## 2015-10-08 DIAGNOSIS — N401 Enlarged prostate with lower urinary tract symptoms: Secondary | ICD-10-CM

## 2015-10-08 DIAGNOSIS — R519 Headache, unspecified: Secondary | ICD-10-CM

## 2015-10-08 DIAGNOSIS — G308 Other Alzheimer's disease: Secondary | ICD-10-CM | POA: Diagnosis not present

## 2015-10-08 DIAGNOSIS — F02818 Dementia in other diseases classified elsewhere, unspecified severity, with other behavioral disturbance: Secondary | ICD-10-CM

## 2015-10-08 DIAGNOSIS — N138 Other obstructive and reflux uropathy: Secondary | ICD-10-CM

## 2015-10-08 NOTE — Progress Notes (Signed)
Pre visit review using our clinic review tool, if applicable. No additional management support is needed unless otherwise documented below in the visit note. 

## 2015-10-08 NOTE — Progress Notes (Signed)
OFFICE VISIT  10/08/2015   CC:  Chief Complaint  Patient presents with  . Follow-up    6 month   HPI:    Patient is a 82 y.o. Caucasian male who presents for 6 mo f/u alzheimer's dementia, BPH, recent R temporal HAs that I treated empirically as temporal arteritis.  He no longer has any R temporal HA's.  BPH: still taking alfuzosin; residual symptoms--nocturia x 3.   Had question about saw palmetto.  However, we noted today that this med has interaction with antiplatelet meds and concurrent use should be avoided.  Dementia: Dr. Evelena Leyden with [facility] has not seen him in 2 yrs.  He has been followed most recently by a neurologist at [facility], part of a study using ritalin to help with apathy.  He responded favorably to the ritalin per his wife: was a bit less resistant when his wife tried to get him to do something.  Wife cannot recall the dose of ritalin he got but she is interested in getting a rx for the med to see if it is affordable out of pocket.  His wife tells of a bad experience at their last f/u with neuro in [facility].  Waited a long time, were treated "like test subjects", had to get seen by 2 student doctors before finally getting seen by the attending.  When wife expressed her displeasure with things at a f/u appt for Mr. Clowers's study drug, the message was apparently passed on to the attending, who subsequently called [name] and said "you don't have to keep coming here if you don't want to".  Needless to say, they don't want to return there. For now, they have chosen to simply f/u with me periodically and have me refill the two alzheimer's meds he is on now. She says his dementia is relatively stable over the last 6 mo or so.    Past Medical History  Diagnosis Date  . Hearing deficit     Bilateral; noise damage in [employer]  Has had hearing aids since about 2008.  . Migraine headache with aura onset in teens    No migraines since about 2004  . BPH (benign prostatic  hypertrophy)     History of acute urinary retention (2006)  Hx of elevated PSA (7.26) + right sided prostate nodule on DRE by urologist.  PSA did not go down with tx with antibiotics--biopsy was done and showed NO MALIGNANCY but some chronic inflammation was present--turned out related to acute cystitis (enterococcus UTI).  PSA was 1.17 in 07/2011 at urologist's (Dr. Karsten Ro).  F/u at urol is now prn as of 08/2014.  Marland Kitchen Headache in back of head     Neurologist treated him with PT for this problem and it helped  . Alzheimer's dementia     with behav disturbance and apathy.  [facility] referral as of 07/2013.  They added namenda 07/2014 due to pt's signif decline in the prior 6 mo.  Marland Kitchen History of TIA (transient ischemic attack) @ 2008    02/2001 and 01/2006 carotid dopplers showed 50% or less stenosis.  . DDD (degenerative disc disease), cervical     C5-6, C6-7 primarily  . History of solitary pulmonary nodule     Initially noted on CT abd for his adrenal hemorrhage s/p fall from ladder 2001.  F/u CT chest 04/2000 showed that it had shrunk from 5mm to 71mm and had no malignant features.  . Adrenal hemorrhage ([place]) 2001    s/p fall  from ladder--a f/u CT abd showed resolution of the hemorrhage- (duodenal polyp incidentally noted;  f/u EGD by Dr. Watt Climes 03/2000 showed small lipomatous mass in duodenum which was confirmed by biopsy.  Marland Kitchen BPPV (benign paroxysmal positional vertigo)   . History of acute cystitis 08/2009    with acute prostatitis (enterococcus faecalis)  . Actinic keratoses   . Left bundle branch hemiblock     mentioned in dx of old records 04/15/2009  . Arthralgia of knee, right 2009    exam suspicious for meniscal origin  . Skin cancer   . Chronic renal insufficiency, stage II (mild) 2015    CrCl 60's    Past Surgical History  Procedure Laterality Date  . Prostate biopsy  04/07/2005    For elevated PSA + right sided prostate nodule on DRE by urologist:  benign biopsy.  .  Tonsillectomy  preteen  . Dental      implants  . Cataract extrac      Outpatient Prescriptions Prior to Visit  Medication Sig Dispense Refill  . alfuzosin (UROXATRAL) 10 MG 24 hr tablet TAKE 1 TABLET DAILY 90 tablet 1  . aspirin 325 MG EC tablet Take 1 tablet (325 mg total) by mouth daily. 90 tablet 3  . BIOTIN PO Take 1 capsule by mouth daily.    Marland Kitchen CALCIUM PO Take 300 mg by mouth daily.    . Cholecalciferol 4000 UNITS CAPS Take 4,000 Units by mouth daily.    . cyanocobalamin 2000 MCG tablet Take 2,000 mcg by mouth daily.    . Glucosamine HCl (GLUCOSAMINE PO) Take by mouth daily.    Marland Kitchen MAGNESIUM PO Take by mouth daily.    . memantine (NAMENDA) 10 MG tablet Take 10 mg by mouth 2 (two) times daily. Per [facility] memory clinic (Dr. Susa Griffins)    . Omega-3 Fatty Acids (OMEGA 3 PO) Take by mouth.    . predniSONE (DELTASONE) 20 MG tablet 3 tabs po qd x 4d, then 2 tabs po qd x 3d, then 1 tab po qd x 2d, then stop 20 tablet 0  . rivastigmine (EXELON) 9.5 mg/24hr APPLY 1 PATCH DAILY 30 patch 6   No facility-administered medications prior to visit.    No Known Allergies  ROS As per HPI  PE: Blood pressure 104/71, pulse 65, temperature 97.8 F (36.6 C), temperature source Oral, height 5\' 7"  (1.702 m), weight 146 lb 12.8 oz (66.588 kg), SpO2 95 %. Gen: Alert, well appearing.  Patient is oriented to person and situation. [id number]: no injection, icteris, swelling, or exudate.  EOMI, PERRLA. Mouth: lips without lesion/swelling.  Oral mucosa pink and moist. Oropharynx without erythema, exudate, or swelling.  CV: RRR, no m/r/g.   LUNGS: CTA bilat, nonlabored resps, good aeration in all lung fields. Neuro: CN 2-12 intact bilaterally, strength 5/5 in proximal and distal upper extremities and lower extremities bilaterally.    No tremor.    No ataxia.  No pronator drift.  LABS:  Lab Results  Component Value Date   TSH 1.41 10/13/2011   Lab Results  Component Value Date   CRP <0.5 09/05/2015    Lab  Results  Component Value Date   WBC 6.9 09/05/2015   HGB 15.4 09/05/2015   HCT 45.2 09/05/2015   MCV 98.3 09/05/2015   PLT 285 09/05/2015   Lab Results  Component Value Date   CREATININE 1.02 09/05/2015   BUN 16 09/05/2015   NA 140 09/05/2015   K 4.6 09/05/2015   CL 105  09/05/2015   CO2 26 09/05/2015   Lab Results  Component Value Date   ALT 16 10/11/2014   AST 23 10/11/2014   ALKPHOS 84 10/11/2014   BILITOT 0.7 10/11/2014   Lab Results  Component Value Date   CHOL 168 10/22/2014   Lab Results  Component Value Date   HDL 53.10 10/22/2014   Lab Results  Component Value Date   LDLCALC 97 10/22/2014   Lab Results  Component Value Date   TRIG 91.0 10/22/2014   Lab Results  Component Value Date   CHOLHDL 3 10/22/2014   Lab Results  Component Value Date   PSA 1.73 08/24/2013   PSA 1.30 01/05/2012    IMPRESSION AND PLAN:  1) Temporal HA's on R: resolved with prednisone.  Question of temporal arteritis. No HAs at all at this time.  Monitor.  2) BPH: stable.  I recommended they go back to uroxatrol and d/c saw palmetto due to saw palmett's potential interaction with his aspirin. They agreed to do this.  3) Alzheimer's dementia: fairly stable.  Tolerating exelon patch and namenda.   Pt's wife to call me with the dose of ritalin he was on as part of the study at [facility] and I'll print a rx for her to see how much it may cost out of pocket.  Currently it certainly doesn't have the "official" indication to treat apathy in Alheimer's dementia YET.  Spent 45 min with pt today, with >50% of this time spent in counseling and care coordination regarding the above problems.  No labs needed today.  FOLLOW UP: Return in about 4 months (around 02/08/2016) for routine chronic illness f/u (30 min).  Signed:  Crissie Sickles, MD           10/08/2015

## 2015-10-10 ENCOUNTER — Ambulatory Visit: Payer: Medicare Other | Admitting: Family Medicine

## 2015-10-16 ENCOUNTER — Ambulatory Visit: Payer: Medicare Other | Admitting: Family Medicine

## 2015-11-18 DIAGNOSIS — H524 Presbyopia: Secondary | ICD-10-CM | POA: Diagnosis not present

## 2015-11-18 DIAGNOSIS — H26493 Other secondary cataract, bilateral: Secondary | ICD-10-CM | POA: Diagnosis not present

## 2015-11-18 DIAGNOSIS — Z961 Presence of intraocular lens: Secondary | ICD-10-CM | POA: Diagnosis not present

## 2015-11-18 DIAGNOSIS — H5213 Myopia, bilateral: Secondary | ICD-10-CM | POA: Diagnosis not present

## 2015-11-27 DIAGNOSIS — H26491 Other secondary cataract, right eye: Secondary | ICD-10-CM | POA: Diagnosis not present

## 2015-12-01 DIAGNOSIS — B351 Tinea unguium: Secondary | ICD-10-CM | POA: Diagnosis not present

## 2015-12-01 DIAGNOSIS — R262 Difficulty in walking, not elsewhere classified: Secondary | ICD-10-CM | POA: Diagnosis not present

## 2015-12-01 DIAGNOSIS — M79672 Pain in left foot: Secondary | ICD-10-CM | POA: Diagnosis not present

## 2015-12-01 DIAGNOSIS — M79671 Pain in right foot: Secondary | ICD-10-CM | POA: Diagnosis not present

## 2015-12-07 ENCOUNTER — Other Ambulatory Visit: Payer: Self-pay | Admitting: Family Medicine

## 2015-12-08 NOTE — Telephone Encounter (Signed)
RF request for alfuzosin LOV: 10/08/15 Next ov: None Last written: 06/10/15 #90 w/ [id number]

## 2016-02-18 ENCOUNTER — Other Ambulatory Visit: Payer: Self-pay | Admitting: Family Medicine

## 2016-03-04 ENCOUNTER — Ambulatory Visit (INDEPENDENT_AMBULATORY_CARE_PROVIDER_SITE_OTHER): Payer: Medicare Other

## 2016-03-04 ENCOUNTER — Ambulatory Visit: Payer: Medicare Other

## 2016-03-04 DIAGNOSIS — Z23 Encounter for immunization: Secondary | ICD-10-CM

## 2016-04-15 DIAGNOSIS — L820 Inflamed seborrheic keratosis: Secondary | ICD-10-CM | POA: Diagnosis not present

## 2016-04-15 DIAGNOSIS — D485 Neoplasm of uncertain behavior of skin: Secondary | ICD-10-CM | POA: Diagnosis not present

## 2016-04-15 DIAGNOSIS — D2339 Other benign neoplasm of skin of other parts of face: Secondary | ICD-10-CM | POA: Diagnosis not present

## 2016-04-15 DIAGNOSIS — L821 Other seborrheic keratosis: Secondary | ICD-10-CM | POA: Diagnosis not present

## 2016-05-31 HISTORY — PX: OTHER SURGICAL HISTORY: SHX169

## 2016-06-03 DIAGNOSIS — R3911 Hesitancy of micturition: Secondary | ICD-10-CM | POA: Diagnosis not present

## 2016-06-03 DIAGNOSIS — N401 Enlarged prostate with lower urinary tract symptoms: Secondary | ICD-10-CM | POA: Diagnosis not present

## 2016-06-09 ENCOUNTER — Encounter (HOSPITAL_COMMUNITY): Payer: Self-pay

## 2016-06-09 ENCOUNTER — Ambulatory Visit: Payer: Self-pay | Admitting: Family Medicine

## 2016-06-09 ENCOUNTER — Emergency Department (HOSPITAL_COMMUNITY)
Admission: EM | Admit: 2016-06-09 | Discharge: 2016-06-09 | Disposition: A | Discharge status: Home / Self Care | Payer: Medicare Other | Attending: Emergency Medicine | Admitting: Emergency Medicine

## 2016-06-09 ENCOUNTER — Telehealth: Payer: Self-pay | Admitting: Family Medicine

## 2016-06-09 DIAGNOSIS — Z8673 Personal history of transient ischemic attack (TIA), and cerebral infarction without residual deficits: Secondary | ICD-10-CM | POA: Diagnosis not present

## 2016-06-09 DIAGNOSIS — Z87891 Personal history of nicotine dependence: Secondary | ICD-10-CM | POA: Diagnosis not present

## 2016-06-09 DIAGNOSIS — Y929 Unspecified place or not applicable: Secondary | ICD-10-CM | POA: Diagnosis not present

## 2016-06-09 DIAGNOSIS — R001 Bradycardia, unspecified: Secondary | ICD-10-CM | POA: Diagnosis not present

## 2016-06-09 DIAGNOSIS — Z043 Encounter for examination and observation following other accident: Secondary | ICD-10-CM | POA: Insufficient documentation

## 2016-06-09 DIAGNOSIS — I499 Cardiac arrhythmia, unspecified: Secondary | ICD-10-CM | POA: Diagnosis not present

## 2016-06-09 DIAGNOSIS — R1110 Vomiting, unspecified: Secondary | ICD-10-CM | POA: Diagnosis not present

## 2016-06-09 DIAGNOSIS — W19XXXA Unspecified fall, initial encounter: Secondary | ICD-10-CM | POA: Insufficient documentation

## 2016-06-09 DIAGNOSIS — N182 Chronic kidney disease, stage 2 (mild): Secondary | ICD-10-CM | POA: Insufficient documentation

## 2016-06-09 DIAGNOSIS — Y999 Unspecified external cause status: Secondary | ICD-10-CM | POA: Diagnosis not present

## 2016-06-09 DIAGNOSIS — E860 Dehydration: Secondary | ICD-10-CM | POA: Diagnosis not present

## 2016-06-09 DIAGNOSIS — R197 Diarrhea, unspecified: Secondary | ICD-10-CM | POA: Diagnosis not present

## 2016-06-09 DIAGNOSIS — Z85828 Personal history of other malignant neoplasm of skin: Secondary | ICD-10-CM | POA: Insufficient documentation

## 2016-06-09 DIAGNOSIS — Y939 Activity, unspecified: Secondary | ICD-10-CM | POA: Diagnosis not present

## 2016-06-09 LAB — BASIC METABOLIC PANEL
Anion gap: 7 (ref 5–15)
BUN: 25 mg/dL — ABNORMAL HIGH (ref 6–20)
CO2: 24 mmol/L (ref 22–32)
Calcium: 8.7 mg/dL — ABNORMAL LOW (ref 8.9–10.3)
Chloride: 107 mmol/L (ref 101–111)
Creatinine, Ser: 1.09 mg/dL (ref 0.61–1.24)
GFR calc Af Amer: >60 mL/min (ref >60)
GFR calc non Af Amer: 58 mL/min — ABNORMAL LOW (ref >60)
Glucose, Bld: 108 mg/dL — ABNORMAL HIGH (ref 65–99)
Potassium: 4.1 mmol/L (ref 3.5–5.1)
Sodium: 138 mmol/L (ref 135–145)

## 2016-06-09 LAB — URINALYSIS, ROUTINE W REFLEX MICROSCOPIC
Bilirubin Urine: NEGATIVE
Glucose, UA: NEGATIVE mg/dL
Hgb urine dipstick: NEGATIVE
Ketones, ur: NEGATIVE mg/dL
Leukocytes, UA: NEGATIVE
Nitrite: NEGATIVE
Protein, ur: 30 mg/dL — AB
Specific Gravity, Urine: 1.032 — ABNORMAL HIGH (ref 1.005–1.030)
pH: 5 (ref 5.0–8.0)

## 2016-06-09 LAB — CBC
HCT: 43.1 % (ref 39.0–52.0)
Hemoglobin: 14.6 g/dL (ref 13.0–17.0)
MCH: 33.4 pg (ref 26.0–34.0)
MCHC: 33.9 g/dL (ref 30.0–36.0)
MCV: 98.6 fL (ref 78.0–100.0)
Platelets: 213 10*3/uL (ref 150–400)
RBC: 4.37 MIL/uL (ref 4.22–5.81)
RDW: 13.3 % (ref 11.5–15.5)
WBC: 5.6 10*3/uL (ref 4.0–10.5)

## 2016-06-09 LAB — CBG MONITORING, ED: Glucose-Capillary: 91 mg/dL (ref 65–99)

## 2016-06-09 MED ORDER — SODIUM CHLORIDE 0.9 % IV SOLN: INTRAVENOUS | Status: DC

## 2016-06-09 MED ORDER — SODIUM CHLORIDE 0.9 % IV BOLUS (SEPSIS)
1000.0000 mL | Freq: Once | INTRAVENOUS | Status: AC
  Administered 2016-06-09: 1000 mL via INTRAVENOUS

## 2016-06-09 NOTE — Telephone Encounter (Signed)
Noted. Agree with this plan. Thank you.

## 2016-06-09 NOTE — ED Notes (Signed)
Patient aware that urine sample is needed. Gave cup of water and urinal is at bedside.

## 2016-06-09 NOTE — Telephone Encounter (Signed)
Spoke with patients wife regarding symptoms. Wife states patient has been experiencing vomiting, diarrhea and loss of appetite. Patient also fell this morning with no apparent injuries and continues to be sleepy. Advised wife to take patient to Emergency Department, wife hesitant about long wait and inability to get him in the car. Advised wife to call EMS/911 for transport to local Emergency Department, wife verbalized understanding.

## 2016-06-09 NOTE — ED Triage Notes (Signed)
He comes to [place] from home. His wife reports an unwitnessed fall; after which she helped him up and he was able walk. Upon their arrival EMS found him to be ambulatory and without a specific c/o pain or discomfort. Pt. Does not recall details of fall. He is alert and mildly confused per his baseline.

## 2016-06-09 NOTE — Telephone Encounter (Signed)
[facility] - Client [facility] Patient Name: Mitchell Abbott DOB: 12/01/1923 Initial Comment Husband has vomiting and diarrhea. Nurse Assessment Nurse: Markus Daft, RN, Sherre Poot Date/Time (Eastern Time): 06/09/2016 10:29:58 AM Confirm and document reason for call. If symptomatic, describe symptoms. ---Caller states that husband has vomiting (3 times) and diarrhea (about 3 times) a few times yesterday AM. She thinks he is dehydrated. He is very sleepy. Eating/drinking very little. Last urinated 2 1/2 hrs - very little, dribbles. This AM, he fell. He was confused and didn't know what he was doing on the floor. h/o Alzheimer's. Doesn't look like he hurt himself. He is still sleeping. Does the patient have any new or worsening symptoms? ---Yes Will a triage be completed? ---Yes Related visit to physician within the last 2 weeks? ---No Does the PT have any chronic conditions? (i.e. diabetes, asthma, etc.) ---Yes List chronic conditions. ---Alzheimer's; enlarged prostate Is this a behavioral health or substance abuse call? ---No Guidelines Guideline Title Affirmed Question Affirmed Notes Final Disposition User Comments Appt made for 1:30 pm today. RN suggetsed ED, but caller has not had good experiences there. Pt awoke easily and didn't want to be bothered right now. caller will keep appt today with Dr. Raoul Pitch.

## 2016-06-09 NOTE — ED Provider Notes (Signed)
Newborn DEPT Provider Note   CSN: [id number] Arrival date & time: 06/09/16  1331     History   Chief Complaint Chief Complaint  Patient presents with  . Fall    HPI Mitchell Abbott is a 83 y.o. male.  83 year old male who had a unwitnessed fall prior to arrival. According to his wife who is a caregiver states that she found him in his room with his back against the wall and he was midway down the wall and then sat down on his bottom. States that he did not lose consciousness. He does have a history of dementia. No new medication changes. Denies any recent illnesses except that he did have what she believes to be gastritis with vomiting and diarrhea 3 yesterday but none today. He is unable to keep down graham crackers and milk. Patient denies any injuries at this time. She called his physician and was told to come in for further evaluation.      Past Medical History:  Diagnosis Date  . Actinic keratoses   . Adrenal hemorrhage ([place]) 2001   s/p fall from ladder--a f/u CT abd showed resolution of the hemorrhage- (duodenal polyp incidentally noted;  f/u EGD by Dr. Watt Climes 03/2000 showed small lipomatous mass in duodenum which was confirmed by biopsy.  . Alzheimer's dementia    with behav disturbance and apathy.  [facility] referral as of 07/[facility].  They added namenda 07/2014 due to pt's signif decline in the prior 6 mo.  . Arthralgia of knee, right 2009   exam suspicious for meniscal origin  . BPH (benign prostatic hypertrophy)    History of acute urinary retention (2006)  Hx of elevated PSA (7.26) + right sided prostate nodule on DRE by urologist.  PSA did not go down with tx with antibiotics--biopsy was done and showed NO MALIGNANCY but some chronic inflammation was present--turned out related to acute cystitis (enterococcus UTI).  PSA was 1.17 in 07/2011 at urologist's (Dr. Karsten Ro).  F/u at urol is now prn as of 08/2014.  Marland Kitchen BPPV (benign paroxysmal positional vertigo)   .  Chronic renal insufficiency, stage II (mild) 2015   CrCl 60's  . DDD (degenerative disc disease), cervical    C5-6, C6-7 primarily  . Headache in back of head    Neurologist treated him with PT for this problem and it helped  . Hearing deficit    Bilateral; noise damage in [employer]  Has had hearing aids since about 2008.  Marland Kitchen History of acute cystitis 08/2009   with acute prostatitis (enterococcus faecalis)  . History of solitary pulmonary nodule    Initially noted on CT abd for his adrenal hemorrhage s/p fall from ladder 2001.  F/u CT chest 04/2000 showed that it had shrunk from 82mm to 65mm and had no malignant features.  . History of TIA (transient ischemic attack) @ 2008   02/2001 and 01/2006 carotid dopplers showed 50% or less stenosis.  . Left bundle branch hemiblock    mentioned in dx of old records 04/15/2009  . Migraine headache with aura onset in teens   No migraines since about 2004  . Skin cancer     Patient Active Problem List   Diagnosis Date Noted  . Maxillary sinusitis 05/27/2015  . Allergic rhinitis 01/20/2015  . Bacterial conjunctivitis of left eye 11/05/2014  . Chronic renal insufficiency, stage II (mild) 09/03/2013  . Observation for suspected malignant neoplasm 09/03/2013  . Preventative health care 09/03/2013  . Orthostatic syncope 02/05/2013  .  Mild cognitive impairment 10/20/2012  . Cervical myofascial strain 08/15/2012  . Sacroiliac joint pain 02/18/2012  . History of TIA (transient ischemic attack)   . BPH (benign prostatic hypertrophy) 10/13/2011  . Headache disorder 10/13/2011  . Dementia 10/13/2011    Past Surgical History:  Procedure Laterality Date  . cataract extrac    . dental     implants  . PROSTATE BIOPSY  04/07/2005   For elevated PSA + right sided prostate nodule on DRE by urologist:  benign biopsy.  . TONSILLECTOMY  preteen       Home Medications    Prior to Admission medications   Medication Sig Start Date End Date Taking?  Authorizing Provider  alfuzosin (UROXATRAL) 10 MG 24 hr tablet TAKE 1 TABLET DAILY 12/08/15   Tammi Sou, MD  aspirin 325 MG EC tablet Take 1 tablet (325 mg total) by mouth daily. 10/20/12   Marcial Pacas, MD  BIOTIN PO Take 1 capsule by mouth daily.    Historical Provider, MD  CALCIUM PO Take 300 mg by mouth daily.    Historical Provider, MD  Cholecalciferol 4000 UNITS CAPS Take 4,000 Units by mouth daily.    Historical Provider, MD  cyanocobalamin 2000 MCG tablet Take 2,000 mcg by mouth daily.    Historical Provider, MD  Glucosamine HCl (GLUCOSAMINE PO) Take by mouth daily.    Historical Provider, MD  MAGNESIUM PO Take by mouth daily.    Historical Provider, MD  memantine (NAMENDA) 10 MG tablet Take 10 mg by mouth 2 (two) times daily. Per [facility] memory clinic (Dr. Susa Griffins)    Historical Provider, MD  Omega-3 Fatty Acids (OMEGA 3 PO) Take by mouth.    Historical Provider, MD  predniSONE (DELTASONE) 20 MG tablet 3 tabs po qd x 4d, then 2 tabs po qd x 3d, then 1 tab po qd x 2d, then stop 09/05/15   Tammi Sou, MD  rivastigmine (EXELON) 9.5 mg/24hr APPLY 1 PATCH DAILY 02/18/16   Tammi Sou, MD    Family History Family History  Problem Relation Age of Onset  . Sudden death Mother 61    FH info obtained from Dr. Ovid Curd records.  . Breast cancer Daughter   . Migraines Daughter     Social History Social History  Substance Use Topics  . Smoking status: Former Smoker    Years: 2.00    Types: Cigarettes  . Smokeless tobacco: Never Used  . Alcohol use 0.6 oz/week    1 Cans of beer per week     Comment: "a good drink every day" BEER/WINE     Allergies   Patient has no known allergies.   Review of Systems Review of Systems  All other systems reviewed and are negative.    Physical Exam Updated Vital Signs BP 109/67 (BP Location: Right Arm)   Pulse (!) 59   Temp 97.7 F (36.5 C) (Oral)   Resp 17   SpO2 100%   Physical Exam  Constitutional: He is oriented to person,  place, and time. He appears well-developed and well-nourished.  Non-toxic appearance. No distress.  HENT:  Head: Normocephalic and atraumatic.  Eyes: Conjunctivae, EOM and lids are normal. Pupils are equal, round, and reactive to light.  Neck: Normal range of motion. Neck supple. No tracheal deviation present. No thyroid mass present.  Cardiovascular: Normal rate, regular rhythm and normal heart sounds.  Exam reveals no gallop.   No murmur heard. Pulmonary/Chest: Effort normal and breath sounds normal. No  stridor. No respiratory distress. He has no decreased breath sounds. He has no wheezes. He has no rhonchi. He has no rales.  Abdominal: Soft. Normal appearance and bowel sounds are normal. He exhibits no distension. There is no tenderness. There is no rebound and no CVA tenderness.  Musculoskeletal: Normal range of motion. He exhibits no edema or tenderness.  Neurological: He is alert and oriented to person, place, and time. He has normal strength. No cranial nerve deficit or sensory deficit. GCS eye subscore is 4. GCS verbal subscore is 5. GCS motor subscore is 6.  Skin: Skin is warm and dry. No abrasion and no rash noted.  Psychiatric: He has a normal mood and affect. His speech is normal and behavior is normal.  Nursing note and vitals reviewed.    ED Treatments / Results  Labs (all labs ordered are listed, but only abnormal results are displayed) Labs Reviewed  CBC  BASIC METABOLIC PANEL  URINALYSIS, ROUTINE W REFLEX MICROSCOPIC  CBG MONITORING, ED    EKG  EKG Interpretation None       Radiology No results found.  Procedures Procedures (including critical care time)  Medications Ordered in ED Medications  sodium chloride 0.9 % bolus 1,000 mL (not administered)  0.9 %  sodium chloride infusion (not administered)     Initial Impression / Assessment and Plan / ED Course  I have reviewed the triage vital signs and the nursing notes.  Pertinent labs & imaging results  that were available during my care of the patient were reviewed by me and considered in my medical decision making (see chart for details).  Clinical Course     Labs urinalysis without acute findings. Patient stable for discharge  Final Clinical Impressions(s) / ED Diagnoses   Final diagnoses:  None    New Prescriptions New Prescriptions   No medications on file     Lacretia Leigh, MD 06/09/16 1656

## 2016-06-09 NOTE — ED Notes (Signed)
Bed: RESB Expected date:  Expected time:  Means of arrival:  Comments: EMS 90, Syncope

## 2016-06-25 ENCOUNTER — Encounter: Payer: Self-pay | Admitting: Family Medicine

## 2016-06-25 ENCOUNTER — Ambulatory Visit (INDEPENDENT_AMBULATORY_CARE_PROVIDER_SITE_OTHER): Payer: Medicare Other | Admitting: Family Medicine

## 2016-06-25 VITALS — BP 131/83 | HR 73 | Temp 99.5°F | Resp 16 | Ht 67.0 in | Wt 144.5 lb

## 2016-06-25 DIAGNOSIS — R69 Illness, unspecified: Secondary | ICD-10-CM | POA: Diagnosis not present

## 2016-06-25 DIAGNOSIS — R42 Dizziness and giddiness: Secondary | ICD-10-CM | POA: Diagnosis not present

## 2016-06-25 DIAGNOSIS — J111 Influenza due to unidentified influenza virus with other respiratory manifestations: Secondary | ICD-10-CM

## 2016-06-25 MED ORDER — OSELTAMIVIR PHOSPHATE 75 MG PO CAPS: 75.0000 mg | ORAL_CAPSULE | Freq: Two times a day (BID) | ORAL | 0 refills | Status: DC

## 2016-06-25 NOTE — Progress Notes (Signed)
Pre visit review using our clinic review tool, if applicable. No additional management support is needed unless otherwise documented below in the visit note. 

## 2016-06-25 NOTE — Progress Notes (Signed)
OFFICE VISIT  06/25/2016   CC:  Chief Complaint  Patient presents with  . Cough   HPI:    Patient is a 83 y.o. Caucasian male who presents for cough and "not feeling well". Wife is with him, patient has signif dementia and can't contribute much to the history. Tm at home 98.  Last couple days wife says he has been a little wobbly with walking as if he was lightheaded. He sat in hot tub yesterday for 3-5 min and had near syncope.  No c/o pain. He has eaten today but appetite is down some today.  Some throat clearing but no nasal cong/runny nose. He has been coughing some the last couple days.  No n/v/d.  No c/o HA. He did get his flu vaccine this season (02/2016).   Past Medical History:  Diagnosis Date  . Actinic keratoses   . Adrenal hemorrhage ([place]) 2001   s/p fall from ladder--a f/u CT abd showed resolution of the hemorrhage- (duodenal polyp incidentally noted;  f/u EGD by Dr. Watt Climes 03/2000 showed small lipomatous mass in duodenum which was confirmed by biopsy.  . Alzheimer's dementia    with behav disturbance and apathy.  [facility] referral as of 07/2013.  They added namenda 07/2014 due to pt's signif decline in the prior 6 mo.  . Arthralgia of knee, right 2009   exam suspicious for meniscal origin  . BPH (benign prostatic hypertrophy)    History of acute urinary retention (2006)  Hx of elevated PSA (7.26) + right sided prostate nodule on DRE by urologist.  PSA did not go down with tx with antibiotics--biopsy was done and showed NO MALIGNANCY but some chronic inflammation was present--turned out related to acute cystitis (enterococcus UTI).  PSA was 1.17 in 07/2011 at urologist's (Dr. Karsten Ro).  F/u at urol is now prn as of 08/2014.  Marland Kitchen BPPV (benign paroxysmal positional vertigo)   . Chronic renal insufficiency, stage II (mild) 2015   CrCl 60's  . DDD (degenerative disc disease), cervical    C5-6, C6-7 primarily  . Headache in back of head    Neurologist treated him with  PT for this problem and it helped  . Hearing deficit    Bilateral; noise damage in [employer]  Has had hearing aids since about 2008.  Marland Kitchen History of acute cystitis 08/2009   with acute prostatitis (enterococcus faecalis)  . History of solitary pulmonary nodule    Initially noted on CT abd for his adrenal hemorrhage s/p fall from ladder 2001.  F/u CT chest 04/2000 showed that it had shrunk from 44mm to 37mm and had no malignant features.  . History of TIA (transient ischemic attack) @ 2008   02/2001 and 01/2006 carotid dopplers showed 50% or less stenosis.  . Left bundle branch hemiblock    mentioned in dx of old records 04/15/2009  . Migraine headache with aura onset in teens   No migraines since about 2004  . Skin cancer     Past Surgical History:  Procedure Laterality Date  . cataract extrac    . dental     implants  . PROSTATE BIOPSY  04/07/2005   For elevated PSA + right sided prostate nodule on DRE by urologist:  benign biopsy.  . TONSILLECTOMY  preteen    Outpatient Medications Prior to Visit  Medication Sig Dispense Refill  . aspirin 325 MG EC tablet Take 1 tablet (325 mg total) by mouth daily. (Patient taking differently: Take 162.5 mg by  mouth daily. ) 90 tablet 3  . BIOTIN PO Take 1 capsule by mouth daily.    Marland Kitchen CALCIUM PO Take by mouth daily.     . CHOLECALCIFEROL PO Take by mouth.    . CYANOCOBALAMIN PO Take by mouth.    Marland Kitchen glucosamine-chondroitin 500-400 MG tablet Take 1 tablet by mouth daily.    Marland Kitchen MAGNESIUM PO Take by mouth daily.    . memantine (NAMENDA) 10 MG tablet Take 10 mg by mouth daily. Per [facility] memory clinic (Dr. Susa Griffins)     . Omega-3 Fatty Acids (OMEGA 3 PO) Take by mouth.    Marland Kitchen RAPAFLO 4 MG CAPS capsule Take 4 mg by mouth daily.    . rivastigmine (EXELON) 9.5 mg/24hr APPLY 1 PATCH DAILY 90 patch 1  . VITAMIN E PO Take 1 capsule by mouth.    Marland Kitchen alfuzosin (UROXATRAL) 10 MG 24 hr tablet TAKE 1 TABLET DAILY (Patient not taking: Reported on 06/09/2016) 90 tablet 1  .  predniSONE (DELTASONE) 20 MG tablet 3 tabs po qd x 4d, then 2 tabs po qd x 3d, then 1 tab po qd x 2d, then stop (Patient not taking: Reported on 06/25/2016) 20 tablet 0   No facility-administered medications prior to visit.     No Known Allergies  ROS As per HPI  PE: Blood pressure 131/83, pulse 73, temperature 99.5 F (37.5 C), temperature source Temporal, resp. rate 16, height 5\' 7"  (1.702 m), weight 144 lb 8 oz (65.5 kg), SpO2 97 %. VS: noted--normal. Gen: alert, NAD, NONTOXIC APPEARING. HEENT: eyes without injection, drainage, or swelling.  Ears: EACs clear, TMs with normal light reflex and landmarks.  Nose: Clear rhinorrhea, with some dried, crusty exudate adherent to mildly injected mucosa.  No purulent d/c.  No paranasal sinus TTP.  No facial swelling.  Throat and mouth without focal lesion.  No pharyngial swelling, erythema, or exudate.   Neck: supple, no LAD.   LUNGS: CTA bilat, nonlabored resps.   CV: RRR, no m/r/g. EXT: no c/c/e SKIN: no rash  LABS:  none  IMPRESSION AND PLAN:  Viral syndrome, influenza possible. High risk pt due to age.   Will treat empirically with tamiflu 75mg  bid x 5d. Push clear liquids, rest. No cough med recommended at this time.  I also recommended that he no longer go in a hot tub: too much risk for him to vasodilate and have syncope.  An After Visit Summary was printed and given to the patient.  FOLLOW UP: Return if symptoms worsen or fail to improve.  Signed:  Crissie Sickles, MD           06/25/2016

## 2016-06-27 ENCOUNTER — Observation Stay (HOSPITAL_COMMUNITY)
Admission: EM | Admit: 2016-06-27 | Discharge: 2016-06-28 | Disposition: A | Discharge status: Home / Self Care | Payer: Medicare Other | Attending: Family Medicine | Admitting: Family Medicine

## 2016-06-27 ENCOUNTER — Emergency Department (HOSPITAL_COMMUNITY): Payer: Medicare Other

## 2016-06-27 ENCOUNTER — Encounter (HOSPITAL_COMMUNITY): Payer: Self-pay

## 2016-06-27 DIAGNOSIS — Z9181 History of falling: Secondary | ICD-10-CM | POA: Diagnosis not present

## 2016-06-27 DIAGNOSIS — N182 Chronic kidney disease, stage 2 (mild): Secondary | ICD-10-CM | POA: Insufficient documentation

## 2016-06-27 DIAGNOSIS — N400 Enlarged prostate without lower urinary tract symptoms: Secondary | ICD-10-CM | POA: Insufficient documentation

## 2016-06-27 DIAGNOSIS — I951 Orthostatic hypotension: Principal | ICD-10-CM | POA: Insufficient documentation

## 2016-06-27 DIAGNOSIS — Z85828 Personal history of other malignant neoplasm of skin: Secondary | ICD-10-CM | POA: Diagnosis not present

## 2016-06-27 DIAGNOSIS — Z79899 Other long term (current) drug therapy: Secondary | ICD-10-CM | POA: Diagnosis not present

## 2016-06-27 DIAGNOSIS — S0101XA Laceration without foreign body of scalp, initial encounter: Secondary | ICD-10-CM

## 2016-06-27 DIAGNOSIS — Z87891 Personal history of nicotine dependence: Secondary | ICD-10-CM | POA: Insufficient documentation

## 2016-06-27 DIAGNOSIS — R55 Syncope and collapse: Secondary | ICD-10-CM | POA: Diagnosis not present

## 2016-06-27 DIAGNOSIS — G309 Alzheimer's disease, unspecified: Secondary | ICD-10-CM | POA: Diagnosis not present

## 2016-06-27 DIAGNOSIS — Y92009 Unspecified place in unspecified non-institutional (private) residence as the place of occurrence of the external cause: Secondary | ICD-10-CM | POA: Diagnosis not present

## 2016-06-27 DIAGNOSIS — J101 Influenza due to other identified influenza virus with other respiratory manifestations: Secondary | ICD-10-CM | POA: Insufficient documentation

## 2016-06-27 DIAGNOSIS — W19XXXA Unspecified fall, initial encounter: Secondary | ICD-10-CM | POA: Diagnosis not present

## 2016-06-27 DIAGNOSIS — S299XXA Unspecified injury of thorax, initial encounter: Secondary | ICD-10-CM | POA: Diagnosis not present

## 2016-06-27 DIAGNOSIS — Z7982 Long term (current) use of aspirin: Secondary | ICD-10-CM | POA: Diagnosis not present

## 2016-06-27 DIAGNOSIS — S199XXA Unspecified injury of neck, initial encounter: Secondary | ICD-10-CM | POA: Diagnosis not present

## 2016-06-27 DIAGNOSIS — F0280 Dementia in other diseases classified elsewhere without behavioral disturbance: Secondary | ICD-10-CM | POA: Diagnosis not present

## 2016-06-27 DIAGNOSIS — S62522A Displaced fracture of distal phalanx of left thumb, initial encounter for closed fracture: Secondary | ICD-10-CM | POA: Diagnosis not present

## 2016-06-27 DIAGNOSIS — S63125A Dislocation of unspecified interphalangeal joint of left thumb, initial encounter: Secondary | ICD-10-CM | POA: Diagnosis not present

## 2016-06-27 DIAGNOSIS — I6523 Occlusion and stenosis of bilateral carotid arteries: Secondary | ICD-10-CM | POA: Diagnosis not present

## 2016-06-27 DIAGNOSIS — S6992XA Unspecified injury of left wrist, hand and finger(s), initial encounter: Secondary | ICD-10-CM | POA: Diagnosis not present

## 2016-06-27 DIAGNOSIS — Z8673 Personal history of transient ischemic attack (TIA), and cerebral infarction without residual deficits: Secondary | ICD-10-CM | POA: Diagnosis not present

## 2016-06-27 DIAGNOSIS — S0990XA Unspecified injury of head, initial encounter: Secondary | ICD-10-CM | POA: Diagnosis not present

## 2016-06-27 DIAGNOSIS — S0181XA Laceration without foreign body of other part of head, initial encounter: Secondary | ICD-10-CM | POA: Diagnosis not present

## 2016-06-27 LAB — CBC WITH DIFFERENTIAL/PLATELET
Basophils Absolute: 0 10*3/uL (ref 0.0–0.1)
Basophils Relative: 0 %
Eosinophils Absolute: 0.1 10*3/uL (ref 0.0–0.7)
Eosinophils Relative: 3 %
HCT: 45.2 % (ref 39.0–52.0)
Hemoglobin: 15.4 g/dL (ref 13.0–17.0)
Lymphocytes Relative: 19 %
Lymphs Abs: 1 10*3/uL (ref 0.7–4.0)
MCH: 33 pg (ref 26.0–34.0)
MCHC: 34.1 g/dL (ref 30.0–36.0)
MCV: 97 fL (ref 78.0–100.0)
Monocytes Absolute: 1 10*3/uL (ref 0.1–1.0)
Monocytes Relative: 19 %
Neutro Abs: 3.2 10*3/uL (ref 1.7–7.7)
Neutrophils Relative %: 59 %
Platelets: 278 10*3/uL (ref 150–400)
RBC: 4.66 MIL/uL (ref 4.22–5.81)
RDW: 13.2 % (ref 11.5–15.5)
WBC: 5.4 10*3/uL (ref 4.0–10.5)

## 2016-06-27 LAB — BASIC METABOLIC PANEL
Anion gap: 10 (ref 5–15)
BUN: 16 mg/dL (ref 6–20)
CO2: 25 mmol/L (ref 22–32)
Calcium: 9.2 mg/dL (ref 8.9–10.3)
Chloride: 103 mmol/L (ref 101–111)
Creatinine, Ser: 0.98 mg/dL (ref 0.61–1.24)
GFR calc Af Amer: >60 mL/min (ref >60)
GFR calc non Af Amer: >60 mL/min (ref >60)
Glucose, Bld: 96 mg/dL (ref 65–99)
Potassium: 4.5 mmol/L (ref 3.5–5.1)
Sodium: 138 mmol/L (ref 135–145)

## 2016-06-27 LAB — INFLUENZA PANEL BY PCR (TYPE A & B)
Influenza A By PCR: POSITIVE — AB
Influenza B By PCR: NEGATIVE

## 2016-06-27 MED ORDER — MEMANTINE HCL 10 MG PO TABS
10.0000 mg | ORAL_TABLET | Freq: Every day | ORAL | Status: DC
  Administered 2016-06-27 – 2016-06-28 (×2): 10 mg via ORAL
  Filled 2016-06-27 (×2): qty 1

## 2016-06-27 MED ORDER — SODIUM CHLORIDE 0.45 % IV SOLN
INTRAVENOUS | Status: AC
  Administered 2016-06-27 (×2): via INTRAVENOUS

## 2016-06-27 MED ORDER — ASPIRIN EC 81 MG PO TBEC
162.0000 mg | DELAYED_RELEASE_TABLET | Freq: Every day | ORAL | Status: DC
  Administered 2016-06-27 – 2016-06-28 (×2): 162 mg via ORAL
  Filled 2016-06-27 (×2): qty 2

## 2016-06-27 MED ORDER — SODIUM CHLORIDE 0.9% FLUSH
3.0000 mL | Freq: Two times a day (BID) | INTRAVENOUS | Status: DC
  Administered 2016-06-27: 3 mL via INTRAVENOUS

## 2016-06-27 MED ORDER — OSELTAMIVIR PHOSPHATE 30 MG PO CAPS
30.0000 mg | ORAL_CAPSULE | Freq: Two times a day (BID) | ORAL | Status: DC
  Administered 2016-06-27 – 2016-06-28 (×2): 30 mg via ORAL
  Filled 2016-06-27 (×3): qty 1

## 2016-06-27 MED ORDER — SODIUM CHLORIDE 0.9 % IV BOLUS (SEPSIS)
1000.0000 mL | Freq: Once | INTRAVENOUS | Status: AC
  Administered 2016-06-27: 1000 mL via INTRAVENOUS

## 2016-06-27 MED ORDER — LIDOCAINE HCL (PF) 1 % IJ SOLN
20.0000 mL | Freq: Once | INTRAMUSCULAR | Status: AC
  Administered 2016-06-27: 20 mL via INTRADERMAL
  Filled 2016-06-27: qty 30

## 2016-06-27 MED ORDER — OSELTAMIVIR PHOSPHATE 75 MG PO CAPS: 75.0000 mg | ORAL_CAPSULE | Freq: Two times a day (BID) | ORAL | Status: DC

## 2016-06-27 MED ORDER — ENOXAPARIN SODIUM 40 MG/0.4ML ~~LOC~~ SOLN
40.0000 mg | SUBCUTANEOUS | Status: DC
  Administered 2016-06-27: 40 mg via SUBCUTANEOUS
  Filled 2016-06-27: qty 0.4

## 2016-06-27 MED ORDER — RIVASTIGMINE 9.5 MG/24HR TD PT24
9.5000 mg | MEDICATED_PATCH | Freq: Every day | TRANSDERMAL | Status: DC
  Administered 2016-06-28: 9.5 mg via TRANSDERMAL
  Filled 2016-06-27: qty 1

## 2016-06-27 MED ORDER — ACETAMINOPHEN 325 MG PO TABS
650.0000 mg | ORAL_TABLET | Freq: Once | ORAL | Status: AC
  Administered 2016-06-27: 650 mg via ORAL
  Filled 2016-06-27: qty 2

## 2016-06-27 NOTE — ED Provider Notes (Signed)
[place] DEPT Provider Note   CSN: [id number] Arrival date & time: 06/27/16  0351   LEVEL 5 CAVEAT - DEMENTIA  History   Chief Complaint Chief Complaint  Patient presents with  . Fall    HPI Mitchell Abbott is a 83 y.o. male.  HPI  83 year old male presents after an unwitnessed fall. History taken from family. 2 previous episodes of syncope in last 1-2 weeks, most recently 3 days ago in a hot tub. Saw PCP 2 days ago and started on tamiflu for presumed flu with cough, sore throat, low grade fevers. These symptoms are now better but now not eating/drinking. Family heard a fall and patient noted to have scalp lac and left thumb dislocation. Patient does not recall what happened.  Past Medical History:  Diagnosis Date  . Actinic keratoses   . Adrenal hemorrhage ([place]) 2001   s/p fall from ladder--a f/u CT abd showed resolution of the hemorrhage- (duodenal polyp incidentally noted;  f/u EGD by Dr. Watt Climes 03/2000 showed small lipomatous mass in duodenum which was confirmed by biopsy.  . Alzheimer's dementia    with behav disturbance and apathy.  [facility] referral as of 07/2013.  They added namenda 07/2014 due to pt's signif decline in the prior 6 mo.  . Arthralgia of knee, right 2009   exam suspicious for meniscal origin  . BPH (benign prostatic hypertrophy)    History of acute urinary retention (2006)  Hx of elevated PSA (7.26) + right sided prostate nodule on DRE by urologist.  PSA did not go down with tx with antibiotics--biopsy was done and showed NO MALIGNANCY but some chronic inflammation was present--turned out related to acute cystitis (enterococcus UTI).  PSA was 1.17 in 07/2011 at urologist's (Dr. Karsten Ro).  F/u at urol is now prn as of 08/2014.  Marland Kitchen BPPV (benign paroxysmal positional vertigo)   . Chronic renal insufficiency, stage II (mild) 2015   CrCl 60's  . DDD (degenerative disc disease), cervical    C5-6, C6-7 primarily  . Headache in back of head    Neurologist treated him with PT for this problem and it helped  . Hearing deficit    Bilateral; noise damage in [employer]  Has had hearing aids since about 2008.  Marland Kitchen History of acute cystitis 08/2009   with acute prostatitis (enterococcus faecalis)  . History of solitary pulmonary nodule    Initially noted on CT abd for his adrenal hemorrhage s/p fall from ladder 2001.  F/u CT chest 04/2000 showed that it had shrunk from 25mm to 71mm and had no malignant features.  . History of TIA (transient ischemic attack) @ 2008   02/2001 and 01/2006 carotid dopplers showed 50% or less stenosis.  . Left bundle branch hemiblock    mentioned in dx of old records 04/15/2009  . Migraine headache with aura onset in teens   No migraines since about 2004  . Skin cancer     Patient Active Problem List   Diagnosis Date Noted  . Syncope 06/27/2016  . Maxillary sinusitis 05/27/2015  . Allergic rhinitis 01/20/2015  . Bacterial conjunctivitis of left eye 11/05/2014  . Chronic renal insufficiency, stage II (mild) 09/03/2013  . Observation for suspected malignant neoplasm 09/03/2013  . Preventative health care 09/03/2013  . Orthostatic syncope 02/05/2013  . Mild cognitive impairment 10/20/2012  . Cervical myofascial strain 08/15/2012  . Sacroiliac joint pain 02/18/2012  . History of TIA (transient ischemic attack)   . BPH (benign prostatic hypertrophy)  10/13/2011  . Headache disorder 10/13/2011  . Dementia 10/13/2011    Past Surgical History:  Procedure Laterality Date  . cataract extrac    . dental     implants  . PROSTATE BIOPSY  04/07/2005   For elevated PSA + right sided prostate nodule on DRE by urologist:  benign biopsy.  . TONSILLECTOMY  preteen       Home Medications    Prior to Admission medications   Medication Sig Start Date End Date Taking? Authorizing Provider  aspirin 325 MG EC tablet Take 1 tablet (325 mg total) by mouth daily. Patient taking differently: Take 162.5 mg by mouth  daily.  10/20/12  Yes Marcial Pacas, MD  BIOTIN PO Take 1 capsule by mouth daily.   Yes Historical Provider, MD  CALCIUM PO Take by mouth daily.    Yes Historical Provider, MD  CHOLECALCIFEROL PO Take by mouth.   Yes Historical Provider, MD  CYANOCOBALAMIN PO Take by mouth.   Yes Historical Provider, MD  glucosamine-chondroitin 500-400 MG tablet Take 1 tablet by mouth daily.   Yes Historical Provider, MD  MAGNESIUM PO Take by mouth daily.   Yes Historical Provider, MD  memantine (NAMENDA) 10 MG tablet Take 10 mg by mouth daily. Per [facility] (Dr. Susa Griffins)    Yes Historical Provider, MD  Omega-3 Fatty Acids (OMEGA 3 PO) Take 1 capsule by mouth.    Yes Historical Provider, MD  oseltamivir (TAMIFLU) 75 MG capsule Take 1 capsule (75 mg total) by mouth 2 (two) times daily. 06/25/16  Yes Tammi Sou, MD  RAPAFLO 4 MG CAPS capsule Take 4 mg by mouth daily. 06/04/16  Yes Historical Provider, MD  rivastigmine (EXELON) 9.5 mg/24hr APPLY 1 PATCH DAILY 02/18/16  Yes Tammi Sou, MD  VITAMIN E PO Take 1 capsule by mouth daily.    Yes Historical Provider, MD    Family History Family History  Problem Relation Age of Onset  . Sudden death Mother 39    FH info obtained from Dr. Ovid Curd records.  . Breast cancer Daughter   . Migraines Daughter     Social History Social History  Substance Use Topics  . Smoking status: Former Smoker    Years: 2.00    Types: Cigarettes  . Smokeless tobacco: Never Used  . Alcohol use 0.6 oz/week    1 Cans of beer per week     Comment: "a good drink every day" BEER/WINE     Allergies   Patient has no known allergies.   Review of Systems Review of Systems  Unable to perform ROS: Dementia     Physical Exam Updated Vital Signs BP 108/73   Pulse 73   Temp 98.9 F (37.2 C) (Rectal)   Resp 26   SpO2 98%   Physical Exam  Constitutional: He appears well-developed and well-nourished.  HENT:  Head: Normocephalic.    Right Ear: External ear normal.   Left Ear: External ear normal.  Nose: Nose normal.  Eyes: Right eye exhibits no discharge. Left eye exhibits no discharge.  Neck: Neck supple.  Cardiovascular: Normal rate, regular rhythm and normal heart sounds.   Pulses:      Radial pulses are 2+ on the left side.  Pulmonary/Chest: Effort normal and breath sounds normal.  Abdominal: Soft. There is no tenderness.  Musculoskeletal: He exhibits no edema.       Left hand: He exhibits decreased range of motion, tenderness, bony tenderness and deformity. Normal sensation noted.  Hands: Neurological: He is alert. He is disoriented.  Awake, alert, disoriented. No facial droop or slurred speech. 5/5 strength in all 4 extremities  Skin: Skin is warm and dry.  Nursing note and vitals reviewed.    ED Treatments / Results  Labs (all labs ordered are listed, but only abnormal results are displayed) Labs Reviewed  BASIC METABOLIC PANEL  CBC WITH DIFFERENTIAL/PLATELET  INFLUENZA PANEL BY PCR (TYPE A & B)    EKG  EKG Interpretation  Date/Time:  Sunday June 27 2016 06:09:20 EST Ventricular Rate:  64 PR Interval:    QRS Duration: 108 QT Interval:  424 QTC Calculation: 438 R Axis:   -76 Text Interpretation:  Sinus rhythm Consider right atrial enlargement Left anterior fascicular block Abnormal R-wave progression, early transition Baseline wander in lead(s) V2 V3 no significant change since Jun 09 2016 Confirmed by Regenia Skeeter MD, Laymond Postle [phone]) on 06/27/2016 6:39:32 AM       Radiology Dg Chest 2 View  Result Date: 06/27/2016 CLINICAL DATA:  83 year old male with fall. Flu-like symptoms. Laceration to the back of the head. EXAM: CHEST  2 VIEW COMPARISON:  None. FINDINGS: Two views of the chest demonstrate bibasilar linear and streaky densities most compatible with atelectasis/ scarring. There is no focal consolidation, pleural effusion, or pneumothorax. The cardiac silhouette is within normal limits. The aorta is mildly tortuous.  There is osteopenia with degenerative changes of the spine. Lower thoracic compression deformity with anterior wedging appears old. No definite acute fracture. IMPRESSION: No active cardiopulmonary disease. Electronically Signed   By: Anner Crete M.D.   On: 06/27/2016 06:55   Ct Head Wo Contrast  Result Date: 06/27/2016 CLINICAL DATA:  Fall.  Head injury EXAM: CT HEAD WITHOUT CONTRAST CT CERVICAL SPINE WITHOUT CONTRAST TECHNIQUE: Multidetector CT imaging of the head and cervical spine was performed following the standard protocol without intravenous contrast. Multiplanar CT image reconstructions of the cervical spine were also generated. COMPARISON:  MRI head 08/12/2011 FINDINGS: CT HEAD FINDINGS Brain: Moderate atrophy. Negative for acute infarct. Negative for hemorrhage or mass. No shift of the midline structures. Vascular: No hyperdense vessel or unexpected calcification. Skull: Negative for fracture Sinuses/Orbits: Mucosal thickening and bony thickening of the right maxillary sinus. Findings consistent with chronic sinusitis. Remaining sinuses clear. Other: None CT CERVICAL SPINE FINDINGS Alignment: Anterolisthesis C4-5 3 mm. 4 mm anterolisthesis C7-T1. 2 mm anterolisthesis T1-T2. These areas are associated with disc and facet degeneration. Skull base and vertebrae: No fracture identified. Soft tissues and spinal canal: Negative Disc levels: Severe disc and facet degeneration throughout the cervical spine. Multilevel foraminal encroachment due to bony overgrowth. Upper chest: Negative Other: None IMPRESSION: Generalized moderate atrophy.  No acute intracranial abnormality Advanced cervical disc and facet degeneration. Negative for cervical spine fracture. Electronically Signed   By: Franchot Gallo M.D.   On: 06/27/2016 06:57   Ct Cervical Spine Wo Contrast  Result Date: 06/27/2016 CLINICAL DATA:  Fall.  Head injury EXAM: CT HEAD WITHOUT CONTRAST CT CERVICAL SPINE WITHOUT CONTRAST TECHNIQUE:  Multidetector CT imaging of the head and cervical spine was performed following the standard protocol without intravenous contrast. Multiplanar CT image reconstructions of the cervical spine were also generated. COMPARISON:  MRI head 08/12/2011 FINDINGS: CT HEAD FINDINGS Brain: Moderate atrophy. Negative for acute infarct. Negative for hemorrhage or mass. No shift of the midline structures. Vascular: No hyperdense vessel or unexpected calcification. Skull: Negative for fracture Sinuses/Orbits: Mucosal thickening and bony thickening of the right maxillary sinus. Findings consistent with chronic  sinusitis. Remaining sinuses clear. Other: None CT CERVICAL SPINE FINDINGS Alignment: Anterolisthesis C4-5 3 mm. 4 mm anterolisthesis C7-T1. 2 mm anterolisthesis T1-T2. These areas are associated with disc and facet degeneration. Skull base and vertebrae: No fracture identified. Soft tissues and spinal canal: Negative Disc levels: Severe disc and facet degeneration throughout the cervical spine. Multilevel foraminal encroachment due to bony overgrowth. Upper chest: Negative Other: None IMPRESSION: Generalized moderate atrophy.  No acute intracranial abnormality Advanced cervical disc and facet degeneration. Negative for cervical spine fracture. Electronically Signed   By: Franchot Gallo M.D.   On: 06/27/2016 06:57   Dg Hand Complete Left  Result Date: 06/27/2016 CLINICAL DATA:  Fall EXAM: LEFT HAND - COMPLETE 3+ VIEW COMPARISON:  None. FINDINGS: Negative for fracture Osteoarthritis in the hand most notably in the base of the thumb, first interphalangeal joint and second and third DIP joint. Multiple interphalangeal joint show no significant degenerative change. No erosion. IMPRESSION: Negative for fracture. Electronically Signed   By: Franchot Gallo M.D.   On: 06/27/2016 07:03     Procedures .Marland KitchenLaceration Repair Date/Time: 06/27/2016 7:13 AM Performed by: Sherwood Gambler Authorized by: Sherwood Gambler   Consent:     Consent obtained:  Verbal   Consent given by:  Spouse Anesthesia (see MAR for exact dosages):    Anesthesia method:  Local infiltration   Local anesthetic:  Lidocaine 1% w/o epi Laceration details:    Location:  Scalp   Scalp location:  Crown   Length (cm):  4 Repair type:    Repair type:  Simple Pre-procedure details:    Preparation:  Patient was prepped and draped in usual sterile fashion and imaging obtained to evaluate for foreign bodies Treatment:    Amount of cleaning:  Standard   Irrigation solution:  Sterile saline   Irrigation method:  Syringe Skin repair:    Repair method:  Staples   Number of staples:  3 Approximation:    Approximation:  Close   Vermilion border: well-aligned   Post-procedure details:    Dressing:  Antibiotic ointment   Patient tolerance of procedure:  Tolerated well, no immediate complications Reduction of dislocation Date/Time: 06/27/2016 7:14 AM Performed by: Sherwood Gambler Authorized by: Sherwood Gambler  Consent: Verbal consent obtained. Consent given by: spouse Local anesthesia used: yes Anesthesia: digital block  Anesthesia: Local anesthesia used: yes Local Anesthetic: lidocaine 1% without epinephrine  Sedation: Patient sedated: no Patient tolerance: Patient tolerated the procedure well with no immediate complications Comments: Left thumb dislocation reduced with traction after digital block    (including critical care time)  Medications Ordered in ED Medications  sodium chloride 0.9 % bolus 1,000 mL (1,000 mLs Intravenous New Bag/Given 06/27/16 0100)  acetaminophen (TYLENOL) tablet 650 mg (650 mg Oral Given 06/27/16 0705)  lidocaine (PF) (XYLOCAINE) 1 % injection 20 mL (20 mLs Intradermal Given 06/27/16 0705)     Initial Impression / Assessment and Plan / ED Course  I have reviewed the triage vital signs and the nursing notes.  Pertinent labs & imaging results that were available during my care of the patient were reviewed by  me and considered in my medical decision making (see chart for details).     Lac repaired as above. Thumb reduced with good alignment visually. Repeat Xray pending. Given multiple syncopal episodes, history of carotid stenosis, will admit for syncope workup. Unclear if this was syncope, history limited due to the chronic dementia. No acute CVA symptoms at this time. Head CT without acute findings. C-collar  removed after neck CT c-spine. Admit to hospitalist for tele obs  Final Clinical Impressions(s) / ED Diagnoses   Final diagnoses:  Fall, initial encounter  Laceration of scalp, initial encounter  Closed dislocation of interphalangeal joint of left thumb, initial encounter    New Prescriptions New Prescriptions   No medications on file     Sherwood Gambler, MD 06/27/16 0840

## 2016-06-27 NOTE — ED Notes (Signed)
Pt is alert and oriented x2.  Pt can recall falling but is unable to explain if it was mechanical fall, and  thinks he slipped on something Pt sustained a laceration to right superior area  of his head and is c/o pain to his left thumb.

## 2016-06-27 NOTE — ED Notes (Signed)
Bed: WA06 Expected date:  Expected time:  Means of arrival:  Comments: Fall, head laceration, thigh pain

## 2016-06-27 NOTE — H&P (Signed)
History and Physical   Mitchell Abbott [id number] DOB: 16-Jun-1923 DOA: 06/27/2016  Referring MD/NP/PA: Dr. Regenia Skeeter, EDP PCP: Tammi Sou, MD Outpatient Specialists: Dr. Evelena Leyden, Neurology  Patient coming from: Home  Chief Complaint: Fall  HPI: Mitchell Abbott is a 83 y.o. male with a history of alzheimer's dementia, h/o TIA, BPH, LAFB who was brought by his wife after a fall at home. Late last night she awoke to a loud sound and found the patient on the floor on his hands and knees confused about how he got there, bleeding from the top of his head and expressing pain in his left thumb.   She reports recurrent falls, the first of which was 1/10 in the setting of 3 days of vomiting and diarrhea. He was evaluated in the ED thought to be dehydrated and sent home in stable condition. More recently, on 1/26 he was at the gym with his wife in a hot tub when he abruptly lost consciousness while getting out of the hot tub. He lost postural tone for a few seconds and his wife held his head out of the water before he abruptly regained consciousness. He was weak afterward. He had been experiencing a cough the day before, so he sought care at his PCP 2 days ago. With a cough and a temperature of 99.25F (his reported baseline is 106F) thought to represent a fever he was started empirically on tamiflu. Since that time he's reported more dizziness, poor appetite without abdominal pain, N/V/D.     ED Course: Vital signs were wnl, ECG showed known LAFB, and labs were normal. A scalp laceration was repaired with staples and left thumb dislocation was reduced in the ED. [facility] called to admit for work up of fall thought to be due to syncope.  Review of Systems: No chest pain, dyspnea, palpitations, or other prodrome is reported by patient, and per HPI. All others reviewed and are negative.   Past Medical History:  Diagnosis Date  . Actinic keratoses   . Adrenal hemorrhage ([place]) 2001   s/p fall from  ladder--a f/u CT abd showed resolution of the hemorrhage- (duodenal polyp incidentally noted;  f/u EGD by Dr. Watt Climes 03/2000 showed small lipomatous mass in duodenum which was confirmed by biopsy.  . Alzheimer's dementia    with behav disturbance and apathy.  [facility] referral as of 07/2013.  They added namenda 07/2014 due to pt's signif decline in the prior 6 mo.  . Arthralgia of knee, right 2009   exam suspicious for meniscal origin  . BPH (benign prostatic hypertrophy)    History of acute urinary retention (2006)  Hx of elevated PSA (7.26) + right sided prostate nodule on DRE by urologist.  PSA did not go down with tx with antibiotics--biopsy was done and showed NO MALIGNANCY but some chronic inflammation was present--turned out related to acute cystitis (enterococcus UTI).  PSA was 1.17 in 07/2011 at urologist's (Dr. Karsten Ro).  F/u at urol is now prn as of 08/2014.  Marland Kitchen BPPV (benign paroxysmal positional vertigo)   . Chronic renal insufficiency, stage II (mild) 2015   CrCl 60's  . DDD (degenerative disc disease), cervical    C5-6, C6-7 primarily  . Headache in back of head    Neurologist treated him with PT for this problem and it helped  . Hearing deficit    Bilateral; noise damage in [employer]  Has had hearing aids since about 2008.  Marland Kitchen History of acute cystitis 08/2009  with acute prostatitis (enterococcus faecalis)  . History of solitary pulmonary nodule    Initially noted on CT abd for his adrenal hemorrhage s/p fall from ladder 2001.  F/u CT chest 04/2000 showed that it had shrunk from 71mm to 92mm and had no malignant features.  . History of TIA (transient ischemic attack) @ 2008   02/2001 and 01/2006 carotid dopplers showed 50% or less stenosis.  . Left bundle branch hemiblock    mentioned in dx of old records 04/15/2009  . Migraine headache with aura onset in teens   No migraines since about 2004  . Skin cancer     Past Surgical History:  Procedure Laterality Date  .  cataract extrac    . dental     implants  . PROSTATE BIOPSY  04/07/2005   For elevated PSA + right sided prostate nodule on DRE by urologist:  benign biopsy.  . TONSILLECTOMY  preteen     reports that he has quit smoking. His smoking use included Cigarettes. He quit after 2.00 years of use. He has never used smokeless tobacco. He reports that he drinks about 0.6 oz of alcohol per week . He reports that he does not use drugs.  No Known Allergies  Family History  Problem Relation Age of Onset  . Sudden death Mother 24    FH info obtained from Dr. Ovid Curd records.  . Breast cancer Daughter   . Migraines Daughter    - Family history otherwise reviewed and not pertinent.  Prior to Admission medications   Medication Sig Start Date End Date Taking? Authorizing Provider  aspirin 325 MG EC tablet Take 1 tablet (325 mg total) by mouth daily. Patient taking differently: Take 162.5 mg by mouth daily.  10/20/12  Yes Marcial Pacas, MD  BIOTIN PO Take 1 capsule by mouth daily.   Yes Historical Provider, MD  CALCIUM PO Take by mouth daily.    Yes Historical Provider, MD  CHOLECALCIFEROL PO Take by mouth.   Yes Historical Provider, MD  CYANOCOBALAMIN PO Take by mouth.   Yes Historical Provider, MD  glucosamine-chondroitin 500-400 MG tablet Take 1 tablet by mouth daily.   Yes Historical Provider, MD  MAGNESIUM PO Take by mouth daily.   Yes Historical Provider, MD  memantine (NAMENDA) 10 MG tablet Take 10 mg by mouth daily. Per [facility] memory clinic (Dr. Susa Griffins)    Yes Historical Provider, MD  Omega-3 Fatty Acids (OMEGA 3 PO) Take 1 capsule by mouth.    Yes Historical Provider, MD  oseltamivir (TAMIFLU) 75 MG capsule Take 1 capsule (75 mg total) by mouth 2 (two) times daily. 06/25/16  Yes Tammi Sou, MD  RAPAFLO 4 MG CAPS capsule Take 4 mg by mouth daily. 06/04/16  Yes Historical Provider, MD  rivastigmine (EXELON) 9.5 mg/24hr APPLY 1 PATCH DAILY 02/18/16  Yes Tammi Sou, MD  VITAMIN E PO Take 1  capsule by mouth daily.    Yes Historical Provider, MD    Physical Exam: Vitals:   06/27/16 0353 06/27/16 0629  BP: 150/91   Pulse: 62   Resp: 20   Temp: 99.1 F (37.3 C) 98.9 F (37.2 C)  TempSrc: Oral Rectal  SpO2: 98%    Constitutional: 83 y.o. male in no distress, calm demeanor Eyes: Lids and conjunctivae normal, pupils constricted but reactive. ENMT: Mucous membranes are moist. Posterior pharynx clear of any exudate or lesions. Upper dentures.  Neck: normal, supple, no masses, no thyromegaly Respiratory: Non-labored breathing without accessory  muscle use. Clear breath sounds to auscultation bilaterally Cardiovascular: Regular rate and rhythm, no murmurs, rubs, or gallops. No carotid bruits. No JVD. No LE edema. 2+ pedal pulses. Abdomen: Normoactive bowel sounds. No tenderness, non-distended, and no masses palpated. No hepatosplenomegaly. GU: No indwelling catheter Musculoskeletal: No clubbing / cyanosis. No joint deformity upper and lower extremities. Left thumb with splint in place. Good ROM, no contractures. Normal muscle tone.  Skin: Warm, dry. ~4cm linear laceration on crown of head with 3 staples in tact, hemostatic. No ulcers. No significant lesions noted.  Neurologic: Very HOH with hearing aids in, otherwise CN II-XII grossly intact. Gait not assessed. Speech normal. No focal deficits in motor strength or sensation in all extremities except for numbness in left thumb s/p digital block.  Psychiatric: Alert and agreeable. Not oriented to place, time or situation. Mood euthymic with congruent affect.   Labs on Admission: I have personally reviewed following labs and imaging studies  CBC:  Recent Labs Lab 06/27/16 0646  WBC 5.4  NEUTROABS 3.2  HGB 15.4  HCT 45.2  MCV 97.0  PLT 0000000   Basic Metabolic Panel:  Recent Labs Lab 06/27/16 0646  NA 138  K 4.5  CL 103  CO2 25  GLUCOSE 96  BUN 16  CREATININE 0.98  CALCIUM 9.2   GFR: Estimated Creatinine  Clearance: 46.4 mL/min (by C-G formula based on SCr of 0.98 mg/dL). Liver Function Tests: No results for input(s): AST, ALT, ALKPHOS, BILITOT, PROT, ALBUMIN in the last 168 hours. No results for input(s): LIPASE, AMYLASE in the last 168 hours. No results for input(s): AMMONIA in the last 168 hours. Coagulation Profile: No results for input(s): INR, PROTIME in the last 168 hours. Cardiac Enzymes: No results for input(s): CKTOTAL, CKMB, CKMBINDEX, TROPONINI in the last 168 hours. BNP (last 3 results) No results for input(s): PROBNP in the last 8760 hours. HbA1C: No results for input(s): HGBA1C in the last 72 hours. CBG: No results for input(s): GLUCAP in the last 168 hours. Lipid Profile: No results for input(s): CHOL, HDL, LDLCALC, TRIG, CHOLHDL, LDLDIRECT in the last 72 hours. Thyroid Function Tests: No results for input(s): TSH, T4TOTAL, FREET4, T3FREE, THYROIDAB in the last 72 hours. Anemia Panel: No results for input(s): VITAMINB12, FOLATE, FERRITIN, TIBC, IRON, RETICCTPCT in the last 72 hours. Urine analysis:    Component Value Date/Time   COLORURINE AMBER (A) 06/09/2016 1346   APPEARANCEUR CLEAR 06/09/2016 1346   LABSPEC 1.032 (H) 06/09/2016 1346   PHURINE 5.0 06/09/2016 1346   GLUCOSEU NEGATIVE 06/09/2016 1346   GLUCOSEU NEGATIVE 01/05/2012 0914   HGBUR NEGATIVE 06/09/2016 1346   BILIRUBINUR NEGATIVE 06/09/2016 1346   BILIRUBINUR negative 10/11/2014 1323   KETONESUR NEGATIVE 06/09/2016 1346   PROTEINUR 30 (A) 06/09/2016 1346   UROBILINOGEN 0.2 10/11/2014 1323   UROBILINOGEN 0.2 01/05/2012 0914   NITRITE NEGATIVE 06/09/2016 1346   LEUKOCYTESUR NEGATIVE 06/09/2016 1346   Sepsis Labs: @LABRCNTIP (procalcitonin:4,lacticidven:4) )No results found for this or any previous visit (from the past 240 hour(s)).   Radiological Exams on Admission: Dg Chest 2 View  Result Date: 06/27/2016 CLINICAL DATA:  83 year old male with fall. Flu-like symptoms. Laceration to the back of  the head. EXAM: CHEST  2 VIEW COMPARISON:  None. FINDINGS: Two views of the chest demonstrate bibasilar linear and streaky densities most compatible with atelectasis/ scarring. There is no focal consolidation, pleural effusion, or pneumothorax. The cardiac silhouette is within normal limits. The aorta is mildly tortuous. There is osteopenia with degenerative changes of  the spine. Lower thoracic compression deformity with anterior wedging appears old. No definite acute fracture. IMPRESSION: No active cardiopulmonary disease. Electronically Signed   By: Anner Crete M.D.   On: 06/27/2016 06:55   Ct Head Wo Contrast  Result Date: 06/27/2016 CLINICAL DATA:  Fall.  Head injury EXAM: CT HEAD WITHOUT CONTRAST CT CERVICAL SPINE WITHOUT CONTRAST TECHNIQUE: Multidetector CT imaging of the head and cervical spine was performed following the standard protocol without intravenous contrast. Multiplanar CT image reconstructions of the cervical spine were also generated. COMPARISON:  MRI head 08/12/2011 FINDINGS: CT HEAD FINDINGS Brain: Moderate atrophy. Negative for acute infarct. Negative for hemorrhage or mass. No shift of the midline structures. Vascular: No hyperdense vessel or unexpected calcification. Skull: Negative for fracture Sinuses/Orbits: Mucosal thickening and bony thickening of the right maxillary sinus. Findings consistent with chronic sinusitis. Remaining sinuses clear. Other: None CT CERVICAL SPINE FINDINGS Alignment: Anterolisthesis C4-5 3 mm. 4 mm anterolisthesis C7-T1. 2 mm anterolisthesis T1-T2. These areas are associated with disc and facet degeneration. Skull base and vertebrae: No fracture identified. Soft tissues and spinal canal: Negative Disc levels: Severe disc and facet degeneration throughout the cervical spine. Multilevel foraminal encroachment due to bony overgrowth. Upper chest: Negative Other: None IMPRESSION: Generalized moderate atrophy.  No acute intracranial abnormality Advanced  cervical disc and facet degeneration. Negative for cervical spine fracture. Electronically Signed   By: Franchot Gallo M.D.   On: 06/27/2016 06:57   Ct Cervical Spine Wo Contrast  Result Date: 06/27/2016 CLINICAL DATA:  Fall.  Head injury EXAM: CT HEAD WITHOUT CONTRAST CT CERVICAL SPINE WITHOUT CONTRAST TECHNIQUE: Multidetector CT imaging of the head and cervical spine was performed following the standard protocol without intravenous contrast. Multiplanar CT image reconstructions of the cervical spine were also generated. COMPARISON:  MRI head 08/12/2011 FINDINGS: CT HEAD FINDINGS Brain: Moderate atrophy. Negative for acute infarct. Negative for hemorrhage or mass. No shift of the midline structures. Vascular: No hyperdense vessel or unexpected calcification. Skull: Negative for fracture Sinuses/Orbits: Mucosal thickening and bony thickening of the right maxillary sinus. Findings consistent with chronic sinusitis. Remaining sinuses clear. Other: None CT CERVICAL SPINE FINDINGS Alignment: Anterolisthesis C4-5 3 mm. 4 mm anterolisthesis C7-T1. 2 mm anterolisthesis T1-T2. These areas are associated with disc and facet degeneration. Skull base and vertebrae: No fracture identified. Soft tissues and spinal canal: Negative Disc levels: Severe disc and facet degeneration throughout the cervical spine. Multilevel foraminal encroachment due to bony overgrowth. Upper chest: Negative Other: None IMPRESSION: Generalized moderate atrophy.  No acute intracranial abnormality Advanced cervical disc and facet degeneration. Negative for cervical spine fracture. Electronically Signed   By: Franchot Gallo M.D.   On: 06/27/2016 06:57   Dg Hand Complete Left  Result Date: 06/27/2016 CLINICAL DATA:  Fall EXAM: LEFT HAND - COMPLETE 3+ VIEW COMPARISON:  None. FINDINGS: Negative for fracture Osteoarthritis in the hand most notably in the base of the thumb, first interphalangeal joint and second and third DIP joint. Multiple  interphalangeal joint show no significant degenerative change. No erosion. IMPRESSION: Negative for fracture. Electronically Signed   By: Franchot Gallo M.D.   On: 06/27/2016 07:03   Carotid U/S 02/08/2006: Similar significant and focal eccentric plaque burden at the right carotid bulb extending into the proximal right ICA. The velocities, however, remain nonelevated with estimated stenosis of less than 50% based on duplex. Mild plaque at the left carotid bifurcation with estimated less than 50% left ICA stenosis.  EKG: Independently reviewed. NSR with LAFB stable from  tracing earlier this month.   Assessment/Plan Active Problems:   * No active hospital problems. *   Syncope: Recurrent episodes recently possibly related to dehydration/acute viral illness causing orthostatic syncope. H/o LAFB on ECG is stable, concern for cardiogenic syncope. No heart block or bradycardia. Has h/o TIA and reported carotid stenosis but no focal symptoms and most recent U/S in 2007 showed nonelevated  velocities, estimated stenosis <50% bilaterally (report above).  - Telemetry observation - Orthostatic vital signs - Up with assistance - PT/OT - Echocardiogram - Will repeat carotid U/S mostly for diagnostic purposes. Doubt he would be candidate for CEA.   Viral illness: Presumptive Dx of flu by PCP with cough and temp 99.12F, started tamiflu 1/26 and has had poor po since that time.  - Check flu PCR and DC tamiflu if negative as he's showing possible intolerance.    History of TIA: No residual deficits - Restart aspirin - Not on statin  BPH: ?alpha blocker predisposing to orthostasis. - Hold alpha blocker for now. - I/O cath prn  Alzheimer's dementia: Relatively stable and highly functioning. Going to gym every other day, lives with wife, ambulating independently PTA.  - Continue exelon and namenda  Scalp laceration: s/p staples in ED 1/28.  - Removal of staples advised at PCP follow up in 7 - 14  days.  DVT prophylaxis: Lovenox  Code Status: Full code confirmed at admission but discussed that life prolonging measures would not be desired if no curative options were available  Family Communication: Wife at bedside Disposition Plan: [facility] home after observation as he was independent prior to admission. Consults called: None  Admission status: Observation    Vance Gather, MD [facility] Pager [phone]  If 7PM-7AM, please contact night-coverage [email or web address] Password [email or web address] 06/27/2016, 7:30 AM

## 2016-06-27 NOTE — ED Triage Notes (Signed)
Pt brought in By EMS from home per EMS/Pt wife pt had a fall at home, pt was in bedroom and found by pts wife. Pt unable to provide details regarding incidence leading up to fall d/t dementia. CBG 112, Pt arrived with C- collar in place, EMS reports states  Pt pupils were pinpoint, O2 sat 90% on RA and pt was placed on 2 lpm via [place] and ncreased to 100%

## 2016-06-28 ENCOUNTER — Observation Stay (HOSPITAL_BASED_OUTPATIENT_CLINIC_OR_DEPARTMENT_OTHER): Payer: Medicare Other

## 2016-06-28 DIAGNOSIS — R55 Syncope and collapse: Secondary | ICD-10-CM

## 2016-06-28 DIAGNOSIS — N400 Enlarged prostate without lower urinary tract symptoms: Secondary | ICD-10-CM

## 2016-06-28 DIAGNOSIS — J101 Influenza due to other identified influenza virus with other respiratory manifestations: Secondary | ICD-10-CM | POA: Diagnosis not present

## 2016-06-28 DIAGNOSIS — I951 Orthostatic hypotension: Secondary | ICD-10-CM | POA: Diagnosis not present

## 2016-06-28 HISTORY — PX: TRANSTHORACIC ECHOCARDIOGRAM: SHX275

## 2016-06-28 LAB — CBC
HCT: 42.2 % (ref 39.0–52.0)
Hemoglobin: 14.2 g/dL (ref 13.0–17.0)
MCH: 32.8 pg (ref 26.0–34.0)
MCHC: 33.6 g/dL (ref 30.0–36.0)
MCV: 97.5 fL (ref 78.0–100.0)
Platelets: 255 10*3/uL (ref 150–400)
RBC: 4.33 MIL/uL (ref 4.22–5.81)
RDW: 13.2 % (ref 11.5–15.5)
WBC: 5.4 10*3/uL (ref 4.0–10.5)

## 2016-06-28 LAB — ECHOCARDIOGRAM COMPLETE
Height: 67 in
Weight: 2317.48 oz

## 2016-06-28 LAB — GLUCOSE, CAPILLARY: Glucose-Capillary: 96 mg/dL (ref 65–99)

## 2016-06-28 MED ORDER — RIVASTIGMINE 4.6 MG/24HR TD PT24: 4.6000 mg | MEDICATED_PATCH | Freq: Every day | TRANSDERMAL | 0 refills | Status: DC

## 2016-06-28 MED ORDER — TAMSULOSIN HCL 0.4 MG PO CAPS
0.4000 mg | ORAL_CAPSULE | Freq: Every day | ORAL | Status: DC
  Administered 2016-06-28: 0.4 mg via ORAL
  Filled 2016-06-28: qty 1

## 2016-06-28 MED ORDER — HALOPERIDOL LACTATE 5 MG/ML IJ SOLN
2.0000 mg | Freq: Once | INTRAMUSCULAR | Status: AC
  Administered 2016-06-28: 2 mg via INTRAMUSCULAR
  Filled 2016-06-28: qty 1

## 2016-06-28 MED ORDER — TRAZODONE HCL 50 MG PO TABS
50.0000 mg | ORAL_TABLET | Freq: Once | ORAL | Status: DC
  Filled 2016-06-28: qty 1

## 2016-06-28 MED ORDER — TAMSULOSIN HCL 0.4 MG PO CAPS: 0.4000 mg | ORAL_CAPSULE | Freq: Every day | ORAL | 0 refills | Status: DC

## 2016-06-28 NOTE — Progress Notes (Signed)
Patient was not agreeable to using urinal. Pt is able to ambulate to restroom with 1 assist for safety.

## 2016-06-28 NOTE — Progress Notes (Signed)
Unable to measure pts output. Pt refuses/can't remember to use urinal. Also pt refuses to wear non-slip socks He ambulates to bathroom in bare feet.

## 2016-06-28 NOTE — Plan of Care (Signed)
Problem: Pain Managment: Goal: General experience of comfort will improve Outcome: Not Progressing Patient c/o pain despite efforts to control with analgesics

## 2016-06-28 NOTE — Progress Notes (Signed)
  Echocardiogram 2D Echocardiogram  has been performed but patient screaming so exam was ended early.  Bobbye Charleston 06/28/2016, 3:17 PM

## 2016-06-28 NOTE — Progress Notes (Signed)
During am assessment RN found patients spouse to be administering home medications. Education provided regarding hospital admission and policy related to medications. Spouse made aware that only medical staff is to administer medications at this time. spouse reports giving patient home medications all day yesterday into the night. After clarification spouse verbalized understanding. Provider updated with information. Will continue to monitor.

## 2016-06-28 NOTE — Evaluation (Signed)
Occupational Therapy Evaluation Patient Details Name: Mitchell Abbott MRN: [id number] DOB: 1924-02-11 Today's Date: 06/28/2016    History of Present Illness 83 yo male admitted with syncope, fall. Sustained L thumb fx, head laceration. Work up for flu. hx of dementia, TIA, BPPV   Clinical Impression   Pt seen for the above diagnosis and has deficits listed below. Pt appears to be at or close to baseline with adls and has 24 hour supervision.  No further OT recommended.    Follow Up Recommendations  No OT follow up;Supervision/Assistance - 24 hour    Equipment Recommendations  None recommended by OT    Recommendations for Other Services       Precautions / Restrictions Precautions Precautions: Fall Precaution Comments: history of falls. Required Braces or Orthoses: Other Brace/Splint (splint on thumb) Other Brace/Splint: splint on thumb Restrictions Weight Bearing Restrictions: No      Mobility Bed Mobility Overal bed mobility: Modified Independent             General bed mobility comments: oob/sitting EOB with RN at start of session  Transfers Overall transfer level: Needs assistance Equipment used: None Transfers: Sit to/from Stand Sit to Stand: Supervision         General transfer comment: for safety    Balance Overall balance assessment: History of Falls;Needs assistance Sitting-balance support: Feet supported Sitting balance-Leahy Scale: Good     Standing balance support: During functional activity Standing balance-Leahy Scale: Good                              ADL Overall ADL's : At baseline                                       General ADL Comments: Pt appears to be at or close to baseline.  Pt does not follow commands well and overall just does what he is going to do and this is his baseline. Pt does not need physical assist with bathing, dressing and toileting.       Vision Vision Assessment?: No  apparent visual deficits   Perception     Praxis Praxis Praxis tested?: Not tested    Pertinent Vitals/Pain Pain Assessment: No/denies pain     Hand Dominance Right   Extremity/Trunk Assessment Upper Extremity Assessment Upper Extremity Assessment: Overall WFL for tasks assessed (WFL except for splint on thumb)   Lower Extremity Assessment Lower Extremity Assessment: Defer to PT evaluation   Cervical / Trunk Assessment Cervical / Trunk Assessment: Kyphotic   Communication Communication Communication: HOH   Cognition Arousal/Alertness: Awake/alert Behavior During Therapy: Restless Overall Cognitive Status: History of cognitive impairments - at baseline                 General Comments: pt moves at well. He does not adhere to safety instructions (i.e not to get up by himself)   General Comments       Exercises       Shoulder Instructions      Home Living Family/patient expects to be discharged to:: Private residence Living Arrangements: Spouse/significant other Available Help at Discharge: Family;Available 24 hours/day Type of Home: House             Bathroom Shower/Tub: Walk-in Psychologist, prison and probation services: Standard         Additional Comments: No family  present and pt with significant dementia therefore what was obtained may not be reliable.      Prior Functioning/Environment Level of Independence: Needs assistance  Gait / Transfers Assistance Needed: supervision ADL's / Homemaking Assistance Needed: wife does homemaking/cooking.  Pt can do bathing/dressing/grooming/toileting   Comments: no family present during session.         OT Problem List:     OT Treatment/Interventions:      OT Goals(Current goals can be found in the care plan section) Acute Rehab OT Goals Patient Stated Goal: none stated. no family present. pt unable OT Goal Formulation: All assessment and education complete, DC therapy  OT Frequency:     Barriers to D/C:             Co-evaluation              End of Session Nurse Communication: Mobility status  Activity Tolerance: Patient tolerated treatment well Patient left: in bed;with bed alarm set;with nursing/sitter in room   Time: [id number] OT Time Calculation (min): 13 min Charges:  OT General Charges $OT Visit: 1 Procedure OT Evaluation $OT Eval Moderate Complexity: 1 Procedure G-Codes: OT G-codes **NOT FOR INPATIENT CLASS** Functional Assessment Tool Used: clinical judgement Functional Limitation: Self care Self Care Current Status [id number]): At least 1 percent but less than 20 percent impaired, limited or restricted Self Care Goal Status [id number]): At least 1 percent but less than 20 percent impaired, limited or restricted Self Care Discharge Status [phone]): At least 1 percent but less than 20 percent impaired, limited or restricted  Glenford Peers 06/28/2016, 10:46 AM  [phone]

## 2016-06-28 NOTE — Progress Notes (Signed)
**  Preliminary report by tech**  Carotid artery duplex complete. Findings are consistent with a 1-39 percent stenosis involving the right internal carotid artery and the left internal carotid artery. The vertebral arteries demonstrate antegrade flow.  06/28/16 11:19 AM Mitchell Abbott RVT

## 2016-06-28 NOTE — Progress Notes (Signed)
[facility]   Patient seen and examined - doing well, mentally at baseline per wife Syncope due to orthostatic hypotension multifactorial 2/2 Rapaflo patient was taking 8 mg and was feeling dizzy and viral infection Influenza  Syncope work up negative - ECHO no aortic stenosis, carotid doppler pre lim 1-39% stenosis b/l  Switch Rapaflo for Flomax, less potent medication  Tamiflu for 5 days   Follow up with PCP   Patient will be discharge home See d/c summary for full details  Chipper Oman, MD

## 2016-06-28 NOTE — Progress Notes (Signed)
Pt observed standing beside bed taking clothes off and disconnecting tele leads. Pt refused to allow this nurse to replace leads. Pt was then trying to pull IV out. On-call was paged and made aware of the situation. 50 mg of Trazadone was ordered PO. Pt spit pill on the floor. On-call once again made aware of situation. 2 mg Haldol ordered IM. Pt had successfully removed IV at that time. Pt was then given 2 mg Haldol IM at 0200.

## 2016-06-28 NOTE — Progress Notes (Signed)
Discharge instructions reviewed with patients spouse. Questions, concerns denied. patient was ambulatory and at baseline, no change from am assessment.

## 2016-06-28 NOTE — Discharge Summary (Addendum)
Physician Discharge Summary  Mitchell Abbott  [id number]  DOB: 11/20/1923  DOA: 06/27/2016 PCP: Tammi Sou, MD  Admit date: 06/27/2016 Discharge date: 06/28/2016  Admitted From: Home Disposition:  Home  Recommendations for Outpatient Follow-up:  1. Follow up with PCP in 1 week  2. Please obtain BMP/CBC in one week for routine assessment  3. Please follow up on the following pending results: Carotid doppler - final results   Discharge Condition: Stable  CODE STATUS: FULL Diet recommendation: Heart Healthy   Brief/Interim Summary: 83 y/o M with hx of Severe Alzheimer's Dementia, h/o TIA, BPH, presented to the ED after a fall from presumed syncope. Patient was found on the floor confused and bleeding form his head. Per wife patient has had recurrent falling episode over the past month aprox 3 episodes. She report that since patient has been taking Rapaflo patient has been more dizzy.   Patient started on tamiflu 2 days PTA by his PCP  for presumed influenza virus, since then patient has been more weak. In the ED patient was found to be orthostatic, scalp was repaired and found a left thumb fracture which was placed on a splint. Patient was also found to be influenza A positive. Patient was treated with Tamiflu, Rapaflo was discontinue and IVF were given. Patient underwent on syncope work, with negative results. Patient is mentally as his baseline, afebrile and doing well. PT evaluated patient and recommended 24 hr assistance, wife will take care of him at home. Patient will be discharge to complete a course of Tamiflu for 5 days and follow up with PCP in 3-5 days   Discharge Diagnoses/Hospital Course:  Syncope - related to orthostatic hypotension could be multifactorial -meds  Rapaflo, Exelon vs viral process with natural physical decondition vs long standing arrhythmia  ECHO No AS - eccentially normal  Carotid doppler - prelim < 40% Rapaflo d/ed - switched for Flomax - less  potent Exelon dose decreased  Follow up with PCP  In 1 week   Influenza A  Continue tamiflu   Trauma - head laceration, L thumb fracture which was splinted  Pain med PRN  Head staple removal in 7-14 day with PCP   BPH  Switched rapaflo - to Flomax - less potent  Follow up with PCP   Alzheimer - stable  Patient's wife considering stopping Nemenda Advised to follow up with Neurology or PCP    Discharge Instructions  You were cared for by a hospitalist during your hospital stay. If you have any questions about your discharge medications or the care you received while you were in the hospital after you are discharged, you can call the unit and asked to speak with the hospitalist on call if the hospitalist that took care of you is not available. Once you are discharged, your primary care physician will handle any further medical issues. Please note that NO REFILLS for any discharge medications will be authorized once you are discharged, as it is imperative that you return to your primary care physician (or establish a relationship with a primary care physician if you do not have one) for your aftercare needs so that they can reassess your need for medications and monitor your lab values.  Discharge Instructions    Call MD for:  difficulty breathing, headache or visual disturbances    Complete by:  As directed    Call MD for:  extreme fatigue    Complete by:  As directed    Call MD for:  hives    Complete by:  As directed    Call MD for:  persistant dizziness or light-headedness    Complete by:  As directed    Call MD for:  persistant nausea and vomiting    Complete by:  As directed    Call MD for:  redness, tenderness, or signs of infection (pain, swelling, redness, odor or green/yellow discharge around incision site)    Complete by:  As directed    Call MD for:  severe uncontrolled pain    Complete by:  As directed    Call MD for:  temperature >100.4    Complete by:  As directed     Diet - low sodium heart healthy    Complete by:  As directed    Discharge instructions    Complete by:  As directed    Increase activity slowly    Complete by:  As directed      Allergies as of 06/28/2016   No Known Allergies     Medication List    STOP taking these medications   RAPAFLO 4 MG Caps capsule Generic drug:  silodosin   rivastigmine 9.5 mg/24hr Commonly known as:  EXELON Replaced by:  rivastigmine 4.6 mg/24hr     TAKE these medications   aspirin 325 MG EC tablet Take 1 tablet (325 mg total) by mouth daily. What changed:  how much to take   BIOTIN PO Take 1 capsule by mouth daily.   CALCIUM PO Take by mouth daily.   CHOLECALCIFEROL PO Take by mouth.   CYANOCOBALAMIN PO Take by mouth.   glucosamine-chondroitin 500-400 MG tablet Take 1 tablet by mouth daily.   MAGNESIUM PO Take by mouth daily.   memantine 10 MG tablet Commonly known as:  NAMENDA Take 10 mg by mouth daily. Per [facility] (Dr. Susa Griffins)   OMEGA 3 PO Take 1 capsule by mouth.   oseltamivir 75 MG capsule Commonly known as:  TAMIFLU Take 1 capsule (75 mg total) by mouth 2 (two) times daily.   rivastigmine 4.6 mg/24hr Commonly known as:  EXELON Place 1 patch (4.6 mg total) onto the skin daily. Replaces:  rivastigmine 9.5 mg/24hr   tamsulosin 0.4 MG Caps capsule Commonly known as:  FLOMAX Take 1 capsule (0.4 mg total) by mouth daily after supper.   VITAMIN E PO Take 1 capsule by mouth daily.      Follow-up Information    MCGOWEN,PHILIP H, MD. Schedule an appointment as soon as possible for a visit in 1 week(s).   Specialty:  Family Medicine Contact information: [address] [place] [address] [phone]          No Known Allergies  Consultations:  None    Procedures/Studies: Dg Chest 2 View  Result Date: 06/27/2016 CLINICAL DATA:  83 year old male with fall. Flu-like symptoms. Laceration to the back of the head. EXAM: CHEST  2 VIEW  COMPARISON:  None. FINDINGS: Two views of the chest demonstrate bibasilar linear and streaky densities most compatible with atelectasis/ scarring. There is no focal consolidation, pleural effusion, or pneumothorax. The cardiac silhouette is within normal limits. The aorta is mildly tortuous. There is osteopenia with degenerative changes of the spine. Lower thoracic compression deformity with anterior wedging appears old. No definite acute fracture. IMPRESSION: No active cardiopulmonary disease. Electronically Signed   By: Anner Crete M.D.   On: 06/27/2016 06:55   Ct Head Wo Contrast  Result Date: 06/27/2016 CLINICAL DATA:  Fall.  Head injury EXAM: CT HEAD WITHOUT CONTRAST CT CERVICAL SPINE WITHOUT CONTRAST TECHNIQUE: Multidetector CT imaging of the head and cervical spine was performed following the standard protocol without intravenous contrast. Multiplanar CT image reconstructions of the cervical spine were also generated. COMPARISON:  MRI head 08/12/2011 FINDINGS: CT HEAD FINDINGS Brain: Moderate atrophy. Negative for acute infarct. Negative for hemorrhage or mass. No shift of the midline structures. Vascular: No hyperdense vessel or unexpected calcification. Skull: Negative for fracture Sinuses/Orbits: Mucosal thickening and bony thickening of the right maxillary sinus. Findings consistent with chronic sinusitis. Remaining sinuses clear. Other: None CT CERVICAL SPINE FINDINGS Alignment: Anterolisthesis C4-5 3 mm. 4 mm anterolisthesis C7-T1. 2 mm anterolisthesis T1-T2. These areas are associated with disc and facet degeneration. Skull base and vertebrae: No fracture identified. Soft tissues and spinal canal: Negative Disc levels: Severe disc and facet degeneration throughout the cervical spine. Multilevel foraminal encroachment due to bony overgrowth. Upper chest: Negative Other: None IMPRESSION: Generalized moderate atrophy.  No acute intracranial abnormality Advanced cervical disc and facet  degeneration. Negative for cervical spine fracture. Electronically Signed   By: Franchot Gallo M.D.   On: 06/27/2016 06:57   Ct Cervical Spine Wo Contrast  Result Date: 06/27/2016 CLINICAL DATA:  Fall.  Head injury EXAM: CT HEAD WITHOUT CONTRAST CT CERVICAL SPINE WITHOUT CONTRAST TECHNIQUE: Multidetector CT imaging of the head and cervical spine was performed following the standard protocol without intravenous contrast. Multiplanar CT image reconstructions of the cervical spine were also generated. COMPARISON:  MRI head 08/12/2011 FINDINGS: CT HEAD FINDINGS Brain: Moderate atrophy. Negative for acute infarct. Negative for hemorrhage or mass. No shift of the midline structures. Vascular: No hyperdense vessel or unexpected calcification. Skull: Negative for fracture Sinuses/Orbits: Mucosal thickening and bony thickening of the right maxillary sinus. Findings consistent with chronic sinusitis. Remaining sinuses clear. Other: None CT CERVICAL SPINE FINDINGS Alignment: Anterolisthesis C4-5 3 mm. 4 mm anterolisthesis C7-T1. 2 mm anterolisthesis T1-T2. These areas are associated with disc and facet degeneration. Skull base and vertebrae: No fracture identified. Soft tissues and spinal canal: Negative Disc levels: Severe disc and facet degeneration throughout the cervical spine. Multilevel foraminal encroachment due to bony overgrowth. Upper chest: Negative Other: None IMPRESSION: Generalized moderate atrophy.  No acute intracranial abnormality Advanced cervical disc and facet degeneration. Negative for cervical spine fracture. Electronically Signed   By: Franchot Gallo M.D.   On: 06/27/2016 06:57   Dg Hand Complete Left  Result Date: 06/27/2016 CLINICAL DATA:  Fall EXAM: LEFT HAND - COMPLETE 3+ VIEW COMPARISON:  None. FINDINGS: Negative for fracture Osteoarthritis in the hand most notably in the base of the thumb, first interphalangeal joint and second and third DIP joint. Multiple interphalangeal joint show no  significant degenerative change. No erosion. IMPRESSION: Negative for fracture. Electronically Signed   By: Franchot Gallo M.D.   On: 06/27/2016 07:03   Dg Finger Thumb Left  Result Date: 06/27/2016 CLINICAL DATA:  Postreduction left thumb dislocation EXAM: LEFT THUMB 2+V COMPARISON:  06/27/2016 FINDINGS: Fracture noted at the anterior base of the left thumb distal phalanx. Subluxation of the distal phalanx posteriorly. Advanced degenerative changes in the ight the joint and first carpometacarpal joint. IMPRESSION: Fracture the base of the left thumb distal phalanx with posterior subluxation. Electronically Signed   By: Rolm Baptise M.D.   On: 06/27/2016 07:40    ECHO  ------------------------------------------------------------------- Study Conclusions  - Left ventricle: The cavity size was normal. Wall thickness was   increased in a pattern of mild LVH.  Systolic function was normal.   The estimated ejection fraction was in the range of 60% to 65%. - Aortic valve: There was no stenosis. - Ascending aorta: The ascending aorta was mildly dilated, 3.8 cm. - Mitral valve: Mildly calcified annulus. There was no significant   regurgitation. - Right ventricle: The cavity size was normal. Systolic function   was normal. - Tricuspid valve: Peak RV-RA gradient 25 mmHg. - Pericardium, extracardiac: Small pericardial effusion.  Impressions:  - Unable to complete exam    Discharge Exam: Vitals:   06/28/16 0610 06/28/16 1400  BP: (!) 145/84 (!) 141/82  Pulse: 64 71  Resp: 18 18  Temp: 98.7 F (37.1 C) 98.1 F (36.7 C)   Vitals:   06/27/16 2059 06/28/16 0500 06/28/16 0610 06/28/16 1400  BP: (!) 156/85  (!) 145/84 (!) 141/82  Pulse: 66  64 71  Resp: 19  18 18   Temp: 97.7 F (36.5 C)  98.7 F (37.1 C) 98.1 F (36.7 C)  TempSrc: Oral  Oral Oral  SpO2: 98%  100% 100%  Weight:  65.7 kg (144 lb 13.5 oz)    Height:        General: Pt is alert, awake, not in acute  distress Cardiovascular: RRR, S1/S2 +, no rubs, no gallops Respiratory: CTA bilaterally, no wheezing, no rhonchi Abdominal: Soft, NT, ND, bowel sounds + Extremities: no edema, no cyanosis    The results of significant diagnostics from this hospitalization (including imaging, microbiology, ancillary and laboratory) are listed below for reference.     Microbiology: No results found for this or any previous visit (from the past 240 hour(s)).   Labs: BNP (last 3 results) No results for input(s): BNP in the last 8760 hours. Basic Metabolic Panel:  Recent Labs Lab 06/27/16 0646  NA 138  K 4.5  CL 103  CO2 25  GLUCOSE 96  BUN 16  CREATININE 0.98  CALCIUM 9.2   Liver Function Tests: No results for input(s): AST, ALT, ALKPHOS, BILITOT, PROT, ALBUMIN in the last 168 hours. No results for input(s): LIPASE, AMYLASE in the last 168 hours. No results for input(s): AMMONIA in the last 168 hours. CBC:  Recent Labs Lab 06/27/16 0646 06/28/16 0446  WBC 5.4 5.4  NEUTROABS 3.2  --   HGB 15.4 14.2  HCT 45.2 42.2  MCV 97.0 97.5  PLT 278 255   Cardiac Enzymes: No results for input(s): CKTOTAL, CKMB, CKMBINDEX, TROPONINI in the last 168 hours. BNP: Invalid input(s): POCBNP CBG:  Recent Labs Lab 06/28/16 0632  GLUCAP 96   D-Dimer No results for input(s): DDIMER in the last 72 hours. Hgb A1c No results for input(s): HGBA1C in the last 72 hours. Lipid Profile No results for input(s): CHOL, HDL, LDLCALC, TRIG, CHOLHDL, LDLDIRECT in the last 72 hours. Thyroid function studies No results for input(s): TSH, T4TOTAL, T3FREE, THYROIDAB in the last 72 hours.  Invalid input(s): FREET3 Anemia work up No results for input(s): VITAMINB12, FOLATE, FERRITIN, TIBC, IRON, RETICCTPCT in the last 72 hours. Urinalysis    Component Value Date/Time   COLORURINE AMBER (A) 06/09/2016 1346   APPEARANCEUR CLEAR 06/09/2016 1346   LABSPEC 1.032 (H) 06/09/2016 1346   PHURINE 5.0 06/09/2016  1346   GLUCOSEU NEGATIVE 06/09/2016 1346   GLUCOSEU NEGATIVE 01/05/2012 0914   HGBUR NEGATIVE 06/09/2016 1346   BILIRUBINUR NEGATIVE 06/09/2016 1346   BILIRUBINUR negative 10/11/2014 1323   KETONESUR NEGATIVE 06/09/2016 1346   PROTEINUR 30 (A) 06/09/2016 1346   UROBILINOGEN 0.2 10/11/2014 1323  UROBILINOGEN 0.2 01/05/2012 0914   NITRITE NEGATIVE 06/09/2016 1346   LEUKOCYTESUR NEGATIVE 06/09/2016 1346   Sepsis Labs Invalid input(s): PROCALCITONIN,  WBC,  LACTICIDVEN Microbiology No results found for this or any previous visit (from the past 240 hour(s)).   Time coordinating discharge: Over 30 minutes  SIGNED:  Chipper Oman, MD  [facility] 06/28/2016, 5:47 PM Pager   If 7PM-7AM, please contact night-coverage [email or web address] Password [email or web address]

## 2016-06-28 NOTE — Evaluation (Addendum)
Physical Therapy Evaluation Patient Details Name: Mitchell Abbott MRN: [id number] DOB: 18-Jul-1923 Today's Date: 06/28/2016   History of Present Illness  83 yo male admitted with syncope, fall. Sustained L thumb fx, head laceration. Work up for flu. hx of dementia, TIA, BPPV  Clinical Impression  On eval, pt was supervision level assist for mobility. He walked ~250 feet in the hallway without an assistive device.No LOB during session. Evaluation is limited due to pt's cognition. He does not adhere to safety recommendations. He gets up repeatedly/sets off alarms despite cueing. He does follow 1 step mobility commands intermittently. No family present during session. Recommend home safety evaluation to ensure safe transition back into home environment.  Should be fine to return home as long as wife feels she can continue to provide care. Recommend daily ambulation with nursing supervision.     Follow Up Recommendations Home Safety Evaluation;Supervision/Assistance - 24 hour    Equipment Recommendations  None recommended by PT    Recommendations for Other Services       Precautions / Restrictions Precautions Precautions: Fall Restrictions Weight Bearing Restrictions: No      Mobility  Bed Mobility               General bed mobility comments: oob/sitting EOB with RN at start of session  Transfers Overall transfer level: Needs assistance   Transfers: Sit to/from Stand Sit to Stand: Supervision         General transfer comment: for safety  Ambulation/Gait Ambulation/Gait assistance: Supervision Ambulation Distance (Feet): 250 Feet Assistive device: None Gait Pattern/deviations: Step-through pattern;Decreased stride length     General Gait Details: for safety. No LOB during session. Asked pt is he was dizzy while walking, he stated "no". However, intermittently he would say "whoa."  Stairs            Wheelchair Mobility    Modified Rankin (Stroke  Patients Only)       Balance Overall balance assessment: History of Falls;Needs assistance           Standing balance-Leahy Scale: Good                               Pertinent Vitals/Pain Pain Assessment: No/denies pain    Home Living Family/patient expects to be discharged to:: Private residence Living Arrangements: Spouse/significant other               Additional Comments: no family present during session.     Prior Function           Comments: no family present during session.      Hand Dominance        Extremity/Trunk Assessment   Upper Extremity Assessment Upper Extremity Assessment: Defer to OT evaluation    Lower Extremity Assessment Lower Extremity Assessment: Overall WFL for tasks assessed    Cervical / Trunk Assessment Cervical / Trunk Assessment: Kyphotic  Communication   Communication: HOH  Cognition Arousal/Alertness: Awake/alert Behavior During Therapy: Restless Overall Cognitive Status: History of cognitive impairments - at baseline                 General Comments: pt moves at well. He does not adhere to safety instructions (i.e not to get up by himself)    General Comments      Exercises     Assessment/Plan    PT Assessment Patent does not need any further PT services  PT Problem List  PT Treatment Interventions      PT Goals (Current goals can be found in the Care Plan section)  Acute Rehab PT Goals Patient Stated Goal: none stated. no family present. pt unable PT Goal Formulation: All assessment and education complete, DC therapy    Frequency     Barriers to discharge        Co-evaluation               End of Session   Activity Tolerance: Patient tolerated treatment well Patient left:  (OT took over care)      Functional Assessment Tool Used: clinical judgement Functional Limitation: Mobility: Walking and moving around Mobility: Walking and Moving Around Current  Status [id number]): At least 1 percent but less than 20 percent impaired, limited or restricted Mobility: Walking and Moving Around Goal Status [id number]): At least 1 percent but less than 20 percent impaired, limited or restricted Mobility: Walking and Moving Around Discharge Status [phone]): At least 1 percent but less than 20 percent impaired, limited or restricted    Time: 0942-0953 PT Time Calculation (min) (ACUTE ONLY): 11 min   Charges:   PT Evaluation $PT Eval Low Complexity: 1 Procedure     PT G Codes:   PT G-Codes **NOT FOR INPATIENT CLASS** Functional Assessment Tool Used: clinical judgement Functional Limitation: Mobility: Walking and moving around Mobility: Walking and Moving Around Current Status [id number]): At least 1 percent but less than 20 percent impaired, limited or restricted Mobility: Walking and Moving Around Goal Status [phone]): At least 1 percent but less than 20 percent impaired, limited or restricted Mobility: Walking and Moving Around Discharge Status [phone]): At least 1 percent but less than 20 percent impaired, limited or restricted    Weston Anna, MPT Pager: [phone]

## 2016-06-29 ENCOUNTER — Telehealth: Payer: Self-pay | Admitting: Family Medicine

## 2016-06-29 ENCOUNTER — Encounter: Payer: Self-pay | Admitting: Family Medicine

## 2016-06-29 NOTE — Telephone Encounter (Signed)
Transition Care Management Follow-up Telephone Call  Date discharged? 06/28/16   How have you been since you were released from the hospital? better   Do you understand why you were in the hospital? No, alz patient   Do you understand the discharge instructions? yes   Where were you discharged to? Home   Items Reviewed:  Medications reviewed: yes  Allergies reviewed: yes  Dietary changes reviewed: yes  Referrals reviewed: no   Functional Questionnaire:   Activities of Daily Living (ADLs):   He states they are independent in the following: all, patient has alz States they require assistance with the following: none   Any transportation issues/concerns?: no   Any patient concerns? no   Confirmed importance and date/time of follow-up visits scheduled yes  Provider Appointment booked with Dr Anitra Lauth  Confirmed with patient if condition begins to worsen call PCP or go to the ER.  Patient was given the office number and encouraged to call back with question or concerns.  : yes

## 2016-06-29 NOTE — Telephone Encounter (Signed)
LMOM for pt's wife, [name] to CB to discuss TCM.

## 2016-06-29 NOTE — Telephone Encounter (Signed)
Noted  

## 2016-06-30 ENCOUNTER — Encounter: Payer: Self-pay | Admitting: Family Medicine

## 2016-06-30 LAB — VAS US CAROTID
LEFT ECA DIAS: 8 cm/s
LEFT VERTEBRAL DIAS: -5 cm/s
Left CCA dist dias: 15 cm/s
Left CCA dist sys: 74 cm/s
Left CCA prox dias: 14 cm/s
Left CCA prox sys: 92 cm/s
Left ICA dist dias: -16 cm/s
Left ICA dist sys: -49 cm/s
Left ICA prox dias: -19 cm/s
Left ICA prox sys: -59 cm/s
RIGHT ECA DIAS: 9 cm/s
RIGHT VERTEBRAL DIAS: -12 cm/s
Right CCA prox dias: 10 cm/s
Right CCA prox sys: 63 cm/s
Right cca dist sys: -61 cm/s

## 2016-07-06 ENCOUNTER — Encounter: Payer: Self-pay | Admitting: Family Medicine

## 2016-07-06 ENCOUNTER — Ambulatory Visit (INDEPENDENT_AMBULATORY_CARE_PROVIDER_SITE_OTHER): Payer: Medicare Other | Admitting: Family Medicine

## 2016-07-06 VITALS — BP 103/69 | HR 64 | Temp 97.7°F | Resp 16 | Ht 67.0 in | Wt 144.0 lb

## 2016-07-06 DIAGNOSIS — S0101XD Laceration without foreign body of scalp, subsequent encounter: Secondary | ICD-10-CM | POA: Diagnosis not present

## 2016-07-06 DIAGNOSIS — F0390 Unspecified dementia without behavioral disturbance: Secondary | ICD-10-CM

## 2016-07-06 DIAGNOSIS — N401 Enlarged prostate with lower urinary tract symptoms: Secondary | ICD-10-CM | POA: Diagnosis not present

## 2016-07-06 DIAGNOSIS — N138 Other obstructive and reflux uropathy: Secondary | ICD-10-CM | POA: Diagnosis not present

## 2016-07-06 DIAGNOSIS — I951 Orthostatic hypotension: Secondary | ICD-10-CM | POA: Diagnosis not present

## 2016-07-06 DIAGNOSIS — J101 Influenza due to other identified influenza virus with other respiratory manifestations: Secondary | ICD-10-CM | POA: Diagnosis not present

## 2016-07-06 DIAGNOSIS — S62502D Fracture of unspecified phalanx of left thumb, subsequent encounter for fracture with routine healing: Secondary | ICD-10-CM

## 2016-07-06 MED ORDER — RIVASTIGMINE 4.6 MG/24HR TD PT24: 4.6000 mg | MEDICATED_PATCH | Freq: Every day | TRANSDERMAL | 3 refills | Status: DC

## 2016-07-06 MED ORDER — TAMSULOSIN HCL 0.4 MG PO CAPS: 0.4000 mg | ORAL_CAPSULE | Freq: Every day | ORAL | 3 refills | Status: DC

## 2016-07-06 NOTE — Progress Notes (Signed)
07/06/2016  CC:  Chief Complaint  Patient presents with  . Hospitalization Follow-up    TCM    Patient is a 83 y.o. Caucasian male who presents for  hospital follow up, specifically [facility] face-to-face visit. Dates hospitalized: 1/28-1/29, 2018. Days since d/c from hospital: 8. Patient was discharged from hospital to home. Reason for admission to hospital: syncope in the setting of influenza illness.  Rapaflo thought to be a contributing medication. Date of interactive (phone) contact with patient and/or caregiver: 06/29/16.  I have reviewed patient's discharge summary plus pertinent specific notes, labs, and imaging from the hospitalization.  He was treated with tamiflu, rapaflo was changed to flomax, IVf were given.  Echocardiogram was essentially normal, carotid dopplers showed no significant obstruction bilaterally.  He sustained a scalp lac and L thumb fracture in the fall.    Currently he is feeling well, back to baseline.  Wife feels like rapaflo was contributing to his falls prior to him getting ill recently.  No pain, no fevers, no cough.  He is eating/drinking well.  Baseline mental status since d/c home from hospital.  Wife will be giving him alfuzosin at home for his bph until his flomax comes in by mail order. His exelon patch dose was also decreased in strength.  Medication reconciliation was done today and patient is taking meds as recommended by discharging hospitalist/specialist except has not restarted exelon yet and has not started flomax yet.  PMH:  Past Medical History:  Diagnosis Date  . Actinic keratoses   . Adrenal hemorrhage ([place]) 2001   s/p fall from ladder--a f/u CT abd showed resolution of the hemorrhage- (duodenal polyp incidentally noted;  f/u EGD by Dr. Watt Climes 03/2000 showed small lipomatous mass in duodenum which was confirmed by biopsy.  . Alzheimer's dementia    with behav disturbance and apathy.  [facility] referral as of  [facility].  They added namenda 07/2014 due to pt's signif decline in the prior 6 mo.  . Arthralgia of knee, right 2009   exam suspicious for meniscal origin  . BPH (benign prostatic hypertrophy)    History of acute urinary retention (2006)  Hx of elevated PSA (7.26) + right sided prostate nodule on DRE by urologist.  PSA did not go down with tx with antibiotics--biopsy was done and showed NO MALIGNANCY but some chronic inflammation was present--turned out related to acute cystitis (enterococcus UTI).  PSA was 1.17 in 07/2011 at urologist's (Dr. Karsten Ro).  F/u at urol is now prn as of 08/2014.  Marland Kitchen BPPV (benign paroxysmal positional vertigo)   . Chronic renal insufficiency, stage II (mild) 2015   CrCl 60's  . DDD (degenerative disc disease), cervical    C5-6, C6-7 primarily  . Headache in back of head    Neurologist treated him with PT for this problem and it helped  . Hearing deficit    Bilateral; noise damage in [employer].  Has had hearing aids since about 2008.  Marland Kitchen History of acute cystitis 08/2009   with acute prostatitis (enterococcus faecalis)  . History of solitary pulmonary nodule    Initially noted on CT abd for his adrenal hemorrhage s/p fall from ladder 2001.  F/u CT chest 04/2000 showed that it had shrunk from 61mm to 59mm and had no malignant features.  . History of TIA (transient ischemic attack) @ 2008   02/2001 and 01/2006 carotid dopplers showed 50% or less stenosis.  . Left bundle branch hemiblock    mentioned in dx  of old records 04/15/2009  . Migraine headache with aura onset in teens   No migraines since about 2004  . Skin cancer     PSH:  Past Surgical History:  Procedure Laterality Date  . Carotid dopplers  05/2016   1-39% stenosis bilat ICA.  . cataract extrac    . dental     implants  . PROSTATE BIOPSY  04/07/2005   For elevated PSA + right sided prostate nodule on DRE by urologist:  benign biopsy.  . TONSILLECTOMY  preteen  . TRANSTHORACIC ECHOCARDIOGRAM  06/28/2016    EF 60-65%, mild dilatation of ascending aorta.    MEDS:  Outpatient Medications Prior to Visit  Medication Sig Dispense Refill  . aspirin 325 MG EC tablet Take 1 tablet (325 mg total) by mouth daily. 90 tablet 3  . BIOTIN PO Take 1 capsule by mouth daily.    Marland Kitchen CALCIUM PO Take by mouth daily.     . CHOLECALCIFEROL PO Take by mouth.    . CYANOCOBALAMIN PO Take by mouth.    Marland Kitchen glucosamine-chondroitin 500-400 MG tablet Take 1 tablet by mouth daily.    Marland Kitchen MAGNESIUM PO Take by mouth daily.    . Omega-3 Fatty Acids (OMEGA 3 PO) Take 1 capsule by mouth.     Marland Kitchen VITAMIN E PO Take 1 capsule by mouth daily.     . memantine (NAMENDA) 10 MG tablet Take 10 mg by mouth daily. Per [facility] memory clinic (Dr. Susa Griffins)     . rivastigmine (EXELON) 4.6 mg/24hr Place 1 patch (4.6 mg total) onto the skin daily. (Patient not taking: Reported on 07/06/2016) 30 patch 0  . tamsulosin (FLOMAX) 0.4 MG CAPS capsule Take 1 capsule (0.4 mg total) by mouth daily after supper. (Patient not taking: Reported on 07/06/2016) 15 capsule 0  . oseltamivir (TAMIFLU) 75 MG capsule Take 1 capsule (75 mg total) by mouth 2 (two) times daily. (Patient not taking: Reported on 07/06/2016) 10 capsule 0   No facility-administered medications prior to visit.   EXAM: BP 103/69 (BP Location: Left Arm, Patient Position: Sitting, Cuff Size: Normal)   Pulse 64   Temp 97.7 F (36.5 C) (Oral)   Resp 16   Ht 5\' 7"  (1.702 m)   Wt 144 lb (65.3 kg)   SpO2 97%   BMI 22.55 kg/m  Gen: Alert, well appearing.  Patient is oriented to person, place, and situation. [id number]: no injection, icteris, swelling, or exudate.  EOMI, PERRLA. Mouth: lips without lesion/swelling.  Oral mucosa pink and moist. Oropharynx without erythema, exudate, or swelling.  CV: RRR, no m/r/g.   LUNGS: CTA bilat, nonlabored resps, good aeration in all lung fields. EXT: no clubbing, cyanosis, or edema.  Scalp: right parietal region with 3-4 cm lac with edges well approximated, scap  noted.  No erythema, swelling, or discharge.  3 staples in place. Left thumb with some mild swelling and ecchymoses, ROM intact.  Mildly TTP distally.   Pertinent labs/imaging Hand x-ray, left, on 06/27/16: Fracture at base of left thumb distal phalanx with posterior subluxation. ECHO 06/27/16: Mild LVH, EF 60-65%, mildly dilated ascending aorta (3.8 cm).  Lab Results  Component Value Date   WBC 5.4 06/28/2016   HGB 14.2 06/28/2016   HCT 42.2 06/28/2016   MCV 97.5 06/28/2016   PLT 255 06/28/2016     Chemistry      Component Value Date/Time   NA 138 06/27/2016 0646   K 4.5 06/27/2016 0646   CL 103 06/27/2016  0646   CO2 25 06/27/2016 0646   BUN 16 06/27/2016 0646   CREATININE 0.98 06/27/2016 0646   CREATININE 1.02 09/05/2015 1528      Component Value Date/Time   CALCIUM 9.2 06/27/2016 0646   ALKPHOS 84 10/11/2014 1232   AST 23 10/11/2014 1232   ALT 16 10/11/2014 1232   BILITOT 0.7 10/11/2014 1232      ASSESSMENT/PLAN:  1) Orthostatic syncope: complicated by recent influenza illness and dehydration. Plan: take alfuzosin left-over pills at home for bph until flomax comes in from mail order pharmacy. Drink PLENTY of fluids.  Exelon patch dose decreased to 4.6mg /24h patch.  2) Influenza A: resolved.  3) Dementia: stable/at baseline.  4) L thumb fx: he won't wear the splint.  He has no pain, just a bit of stiffness.  5) Scalp laceration: looks good.  Removed the 3 staples today in office.  6) BPH with LUTS: we're going to stick with alfuzosin until flomax comes in from mail order. Wife says he seems to be able to empty his bladder fine at this time.  An After Visit Summary was printed and given to the patient.  Medical decision making of moderate complexity was utilized today.  FOLLOW UP:  1 month recheck BPH/orthostatic dizziness  Signed:  Crissie Sickles, MD           07/06/2016

## 2016-07-06 NOTE — Progress Notes (Signed)
Pre visit review using our clinic review tool, if applicable. No additional management support is needed unless otherwise documented below in the visit note. 

## 2016-08-04 ENCOUNTER — Ambulatory Visit: Payer: Medicare Other | Admitting: Family Medicine

## 2016-08-10 NOTE — Progress Notes (Signed)
OFFICE VISIT  08/11/2016   CC:  Chief Complaint  Patient presents with  . Follow-up    BPH, orthostatic dizziness and thumb pain   HPI:    Patient is a 83 y.o. Caucasian male who presents for f/u left thumb distal phalanx fracture. At last f/u in office he had just sustained the fracture and was refusing to wear the splint that the hospital had given him, saying that it didn't hurt but just felt a bit stiff. Thumb is doing much better.  Not painful or stiff now.  The swelling is down quite a bit.  A few weeks ago he wandered out of his home in his underwear and the police found him about 2 miles from home. His wife had been taking a nap when he "snuck" out. He d/c'd his exelon and namenda and his dizziness has improved.  No falls.   He does not currently have a neurologist. Pt's wife took him to the [place].  They set him up with a medical ID bracelet.  Police set her up with a dead bolt for her home so he can't get out.  Dizziness better since switch from rapaflo to flomax 0.4mg  qd, but wife thinks his bph with LUTS not well controlled.   Past Medical History:  Diagnosis Date  . Actinic keratoses   . Adrenal hemorrhage ([place]) 2001   s/p fall from ladder--a f/u CT abd showed resolution of the hemorrhage- (duodenal polyp incidentally noted;  f/u EGD by Dr. Watt Climes 03/2000 showed small lipomatous mass in duodenum which was confirmed by biopsy.  . Alzheimer's dementia    with behav disturbance and apathy.  [facility] referral as of 07/2013.  They added namenda 07/2014 due to pt's signif decline in the prior 6 mo.  . Arthralgia of knee, right 2009   exam suspicious for meniscal origin  . BPH (benign prostatic hypertrophy)    History of acute urinary retention (2006)  Hx of elevated PSA (7.26) + right sided prostate nodule on DRE by urologist.  PSA did not go down with tx with antibiotics--biopsy was done and showed NO MALIGNANCY but some chronic inflammation was present--turned out  related to acute cystitis (enterococcus UTI).  PSA was 1.17 in 07/2011 at urologist's (Dr. Karsten Ro).  F/u at urol is now prn as of 08/2014.  Marland Kitchen BPPV (benign paroxysmal positional vertigo)   . Chronic renal insufficiency, stage II (mild) 2015   CrCl 60's  . DDD (degenerative disc disease), cervical    C5-6, C6-7 primarily  . Headache in back of head    Neurologist treated him with PT for this problem and it helped  . Hearing deficit    Bilateral; noise damage in [employer].  Has had hearing aids since about 2008.  Marland Kitchen History of acute cystitis 08/2009   with acute prostatitis (enterococcus faecalis)  . History of solitary pulmonary nodule    Initially noted on CT abd for his adrenal hemorrhage s/p fall from ladder 2001.  F/u CT chest 04/2000 showed that it had shrunk from 98mm to 90mm and had no malignant features.  . History of TIA (transient ischemic attack) @ 2008   02/2001 and 01/2006 carotid dopplers showed 50% or less stenosis.  . Left bundle branch hemiblock    mentioned in dx of old records 04/15/2009  . Migraine headache with aura onset in teens   No migraines since about 2004  . Skin cancer     Past Surgical History:  Procedure Laterality Date  .  Carotid dopplers  05/2016   1-39% stenosis bilat ICA.  . cataract extrac    . dental     implants  . PROSTATE BIOPSY  04/07/2005   For elevated PSA + right sided prostate nodule on DRE by urologist:  benign biopsy.  . TONSILLECTOMY  preteen  . TRANSTHORACIC ECHOCARDIOGRAM  06/28/2016   EF 60-65%, mild dilatation of ascending aorta.    Outpatient Medications Prior to Visit  Medication Sig Dispense Refill  . aspirin 325 MG EC tablet Take 1 tablet (325 mg total) by mouth daily. 90 tablet 3  . BIOTIN PO Take 1 capsule by mouth daily.    Marland Kitchen CALCIUM PO Take by mouth daily.     . CHOLECALCIFEROL PO Take by mouth.    . CYANOCOBALAMIN PO Take by mouth.    Marland Kitchen glucosamine-chondroitin 500-400 MG tablet Take 1 tablet by mouth daily.    Marland Kitchen  MAGNESIUM PO Take by mouth daily.    . Omega-3 Fatty Acids (OMEGA 3 PO) Take 1 capsule by mouth.     . tamsulosin (FLOMAX) 0.4 MG CAPS capsule Take 1 capsule (0.4 mg total) by mouth daily after supper. 90 capsule 3  . VITAMIN E PO Take 1 capsule by mouth daily.     . memantine (NAMENDA) 10 MG tablet Take 10 mg by mouth daily. Per [facility] memory clinic (Dr. Susa Griffins)     . rivastigmine (EXELON) 4.6 mg/24hr Place 1 patch (4.6 mg total) onto the skin daily. (Patient not taking: Reported on 08/11/2016) 90 patch 3   No facility-administered medications prior to visit.     No Known Allergies  ROS As per HPI  PE: Blood pressure 129/79, pulse 67, temperature 97.8 F (36.6 C), temperature source Oral, resp. rate 16, height 5\' 7"  (1.702 m), weight 151 lb (68.5 kg), SpO2 96 %. Gen: alert, NAD.  Oriented to person and place. CV: RRR, no m/r/g.   LUNGS: CTA bilat, nonlabored resps, good aeration in all lung fields. EXT: no clubbing, cyanosis, or edema.   LABS:    Chemistry      Component Value Date/Time   NA 138 06/27/2016 0646   K 4.5 06/27/2016 0646   CL 103 06/27/2016 0646   CO2 25 06/27/2016 0646   BUN 16 06/27/2016 0646   CREATININE 0.98 06/27/2016 0646   CREATININE 1.02 09/05/2015 1528      Component Value Date/Time   CALCIUM 9.2 06/27/2016 0646   ALKPHOS 84 10/11/2014 1232   AST 23 10/11/2014 1232   ALT 16 10/11/2014 1232   BILITOT 0.7 10/11/2014 1232     Lab Results  Component Value Date   WBC 5.4 06/28/2016   HGB 14.2 06/28/2016   HCT 42.2 06/28/2016   MCV 97.5 06/28/2016   PLT 255 06/28/2016   IMPRESSION AND PLAN:  1) Orthostatic dizziness: improved since switching from rapaflo to flomax 0.4mg  qhs. Also seems to have been some improvement in this as a result of stopping his exelon patch and his namenda. Since BPH with LUTS are inadequately controlled on 0.4mg  flomax dosing, I encouraged wife to increase him to TWO of the 0.4mg  tabs qhs.  Watch for orthostatic dizziness  with first dose after increasing this med.  2) Alzheimer's dementia: gradually worsening.  No significant help when he was on exelon and namenda. Wife is adjusting to his tendencies/mannerisms/wandering.  She cares for him very well. He is remaining active: goes to the [facility] to work out 3 days per week, goes to the  park and walks with dog on other days.  3) Left thumb distal phalanx (proximal aspect/base) fracture: he refused to wear an immobilizing splint. It seems to have healed with a mildly prominent bony callus.  He has no pain or stiffness. He can use thumb and move it as needed at this time.  An After Visit Summary was printed and given to the patient.  Spent 25 min with pt today, with >50% of this time spent in counseling and care coordination regarding the above problems.  FOLLOW UP: Return in about 3 months (around 11/11/2016) for routine chronic illness f/u.  Signed:  Crissie Sickles, MD           08/11/2016

## 2016-08-11 ENCOUNTER — Encounter: Payer: Self-pay | Admitting: Family Medicine

## 2016-08-11 ENCOUNTER — Ambulatory Visit (INDEPENDENT_AMBULATORY_CARE_PROVIDER_SITE_OTHER): Payer: Medicare Other | Admitting: Family Medicine

## 2016-08-11 VITALS — BP 129/79 | HR 67 | Temp 97.8°F | Resp 16 | Ht 67.0 in | Wt 151.0 lb

## 2016-08-11 DIAGNOSIS — N138 Other obstructive and reflux uropathy: Secondary | ICD-10-CM

## 2016-08-11 DIAGNOSIS — F02818 Alzheimer's disease with late onset: Secondary | ICD-10-CM

## 2016-08-11 DIAGNOSIS — F0281 Dementia in other diseases classified elsewhere with behavioral disturbance: Secondary | ICD-10-CM | POA: Diagnosis not present

## 2016-08-11 DIAGNOSIS — N401 Enlarged prostate with lower urinary tract symptoms: Secondary | ICD-10-CM

## 2016-08-11 DIAGNOSIS — G301 Alzheimer's disease with late onset: Secondary | ICD-10-CM | POA: Diagnosis not present

## 2016-08-11 DIAGNOSIS — S62522D Displaced fracture of distal phalanx of left thumb, subsequent encounter for fracture with routine healing: Secondary | ICD-10-CM | POA: Diagnosis not present

## 2016-08-11 DIAGNOSIS — R42 Dizziness and giddiness: Secondary | ICD-10-CM | POA: Diagnosis not present

## 2016-08-11 MED ORDER — TAMSULOSIN HCL 0.4 MG PO CAPS: 0.8000 mg | ORAL_CAPSULE | Freq: Every day | ORAL | 3 refills | Status: DC

## 2016-08-11 NOTE — Progress Notes (Signed)
Pre visit review using our clinic review tool, if applicable. No additional management support is needed unless otherwise documented below in the visit note. 

## 2016-08-11 NOTE — Patient Instructions (Signed)
Take 2 of the flomax 0.4mg  tabs every night.

## 2016-08-11 NOTE — Addendum Note (Signed)
Addended by: Tammi Sou on: 08/11/2016 04:55 PM   Modules accepted: Orders

## 2016-09-02 ENCOUNTER — Encounter: Payer: Self-pay | Admitting: Family Medicine

## 2016-09-02 ENCOUNTER — Ambulatory Visit (INDEPENDENT_AMBULATORY_CARE_PROVIDER_SITE_OTHER): Payer: Medicare Other | Admitting: Family Medicine

## 2016-09-02 VITALS — BP 140/84 | HR 62 | Temp 97.7°F | Resp 16 | Wt 148.0 lb

## 2016-09-02 DIAGNOSIS — J069 Acute upper respiratory infection, unspecified: Secondary | ICD-10-CM

## 2016-09-02 DIAGNOSIS — B9789 Other viral agents as the cause of diseases classified elsewhere: Secondary | ICD-10-CM | POA: Diagnosis not present

## 2016-09-02 NOTE — Progress Notes (Signed)
OFFICE VISIT  09/02/2016   CC:  Chief Complaint  Patient presents with  . Sinusitis    Head congestion   HPI:    Patient is a 83 y.o. Caucasian male with significant dementia who presents accompanied by his wife for respiratory symptoms. About 3 d/a he started getting nasal congestion with PND.  He was exposed to type B influenza about 7-10 days ago. She gave him some flonase this morning and this helped his nose clear up very well. Acting fatigued today but wife not sure if he was doing this in order to get out of going to his adult daycare, which he does not like to go to.  Acting a little more confused today than his normal.  No cough or fever. No ST.  No HA.  No SOB or wheezing.  No body aches.    Past Medical History:  Diagnosis Date  . Actinic keratoses   . Adrenal hemorrhage ([place]) 2001   s/p fall from ladder--a f/u CT abd showed resolution of the hemorrhage- (duodenal polyp incidentally noted;  f/u EGD by Dr. Watt Climes 03/2000 showed small lipomatous mass in duodenum which was confirmed by biopsy.  . Alzheimer's dementia    with behav disturbance and apathy.  [facility] referral as of 07/2013.  They added namenda 07/2014 due to pt's signif decline in the prior 6 mo.  . Arthralgia of knee, right 2009   exam suspicious for meniscal origin  . BPH (benign prostatic hypertrophy)    History of acute urinary retention (2006)  Hx of elevated PSA (7.26) + right sided prostate nodule on DRE by urologist.  PSA did not go down with tx with antibiotics--biopsy was done and showed NO MALIGNANCY but some chronic inflammation was present--turned out related to acute cystitis (enterococcus UTI).  PSA was 1.17 in 07/2011 at urologist's (Dr. Karsten Ro).  F/u at urol is now prn as of 08/2014.  Marland Kitchen BPPV (benign paroxysmal positional vertigo)   . Chronic renal insufficiency, stage II (mild) 2015   CrCl 60's  . DDD (degenerative disc disease), cervical    C5-6, C6-7 primarily  . Headache in back of  head    Neurologist treated him with PT for this problem and it helped  . Hearing deficit    Bilateral; noise damage in [employer].  Has had hearing aids since about 2008.  Marland Kitchen History of acute cystitis 08/2009   with acute prostatitis (enterococcus faecalis)  . History of solitary pulmonary nodule    Initially noted on CT abd for his adrenal hemorrhage s/p fall from ladder 2001.  F/u CT chest 04/2000 showed that it had shrunk from 69mm to 62mm and had no malignant features.  . History of TIA (transient ischemic attack) @ 2008   02/2001 and 01/2006 carotid dopplers showed 50% or less stenosis.  . Left bundle branch hemiblock    mentioned in dx of old records 04/15/2009  . Migraine headache with aura onset in teens   No migraines since about 2004  . Skin cancer     Past Surgical History:  Procedure Laterality Date  . Carotid dopplers  05/2016   1-39% stenosis bilat ICA.  . cataract extrac    . dental     implants  . PROSTATE BIOPSY  04/07/2005   For elevated PSA + right sided prostate nodule on DRE by urologist:  benign biopsy.  . TONSILLECTOMY  preteen  . TRANSTHORACIC ECHOCARDIOGRAM  06/28/2016   EF 60-65%, mild dilatation of ascending aorta.  Outpatient Medications Prior to Visit  Medication Sig Dispense Refill  . aspirin 325 MG EC tablet Take 1 tablet (325 mg total) by mouth daily. 90 tablet 3  . BIOTIN PO Take 1 capsule by mouth daily.    Marland Kitchen CALCIUM PO Take by mouth daily.     . CHOLECALCIFEROL PO Take by mouth.    . CYANOCOBALAMIN PO Take by mouth.    Marland Kitchen glucosamine-chondroitin 500-400 MG tablet Take 1 tablet by mouth daily.    Marland Kitchen MAGNESIUM PO Take by mouth daily.    . Omega-3 Fatty Acids (OMEGA 3 PO) Take 1 capsule by mouth.     . tamsulosin (FLOMAX) 0.4 MG CAPS capsule Take 2 capsules (0.8 mg total) by mouth daily after supper. 180 capsule 3  . VITAMIN E PO Take 1 capsule by mouth daily.      No facility-administered medications prior to visit.     No Known  Allergies  ROS As per HPI  PE: Blood pressure 140/84, pulse 62, temperature 97.7 F (36.5 C), temperature source Oral, resp. rate 16, weight 148 lb (67.1 kg), SpO2 96 %. VS: noted--normal. Gen: alert, NAD, NONTOXIC APPEARING. HEENT: eyes without injection, drainage, or swelling.  Ears: EACs clear, TMs with normal light reflex and landmarks.  Nose: Clear rhinorrhea, with some dried, crusty exudate adherent to mildly injected mucosa.  No purulent d/c.  No paranasal sinus TTP.  No facial swelling.  Throat and mouth without focal lesion.  No pharyngial swelling, erythema, or exudate.   Neck: supple, no LAD.   LUNGS: CTA bilat, nonlabored resps.   CV: RRR, no m/r/g. EXT: no c/c/e SKIN: no rash  LABS:  none  IMPRESSION AND PLAN:  Viral URI. No sign of influenza. He was exposed 7-10 days ago so there is no indication for post-exposure prophylaxis. He'll continue flonase qd prn.   Rest for the next 2-3 days. Signs/symptoms to call or return for were reviewed and pt expressed understanding.  An After Visit Summary was printed and given to the patient.  FOLLOW UP: Return if symptoms worsen or fail to improve.  Signed:  Crissie Sickles, MD           09/02/2016

## 2016-09-27 ENCOUNTER — Telehealth: Payer: Self-pay | Admitting: Family Medicine

## 2016-09-27 NOTE — Telephone Encounter (Signed)
Patient was advised to increase Flomax generic. Patient's wife has questions. Please call her.

## 2016-09-27 NOTE — Telephone Encounter (Signed)
SW pts wife. She wanted to know if pt should take his flomax one in the am and one in the pm or two tab in the pm. Per Dr. Anitra Lauth okay to take two tab in pm. Pts wife advised and voiced understanding.

## 2016-10-29 ENCOUNTER — Encounter: Payer: Self-pay | Admitting: Family Medicine

## 2016-10-29 ENCOUNTER — Telehealth: Payer: Self-pay

## 2016-10-29 ENCOUNTER — Ambulatory Visit (INDEPENDENT_AMBULATORY_CARE_PROVIDER_SITE_OTHER): Payer: Medicare Other | Admitting: Family Medicine

## 2016-10-29 VITALS — BP 111/60 | HR 61 | Temp 98.7°F | Resp 16 | Ht 67.0 in | Wt 146.8 lb

## 2016-10-29 DIAGNOSIS — F0281 Dementia in other diseases classified elsewhere with behavioral disturbance: Secondary | ICD-10-CM | POA: Diagnosis not present

## 2016-10-29 DIAGNOSIS — L989 Disorder of the skin and subcutaneous tissue, unspecified: Secondary | ICD-10-CM

## 2016-10-29 DIAGNOSIS — R509 Fever, unspecified: Secondary | ICD-10-CM | POA: Diagnosis not present

## 2016-10-29 DIAGNOSIS — G308 Other Alzheimer's disease: Secondary | ICD-10-CM

## 2016-10-29 LAB — POCT URINALYSIS DIPSTICK
Bilirubin, UA: NEGATIVE
Blood, UA: NEGATIVE
Glucose, UA: NEGATIVE
Ketones, UA: NEGATIVE
Nitrite, UA: NEGATIVE
Protein, UA: NEGATIVE
Spec Grav, UA: 1.02 (ref 1.010–1.025)
Urobilinogen, UA: 0.2 E.U./dL
pH, UA: 6 (ref 5.0–8.0)

## 2016-10-29 NOTE — Progress Notes (Signed)
OFFICE VISIT  10/29/2016   CC:  Chief Complaint  Patient presents with  . Skin Lesion    left side   HPI:    Patient is a 83 y.o. Caucasian male who has significant dementia with behavioral disturbance presents accompanied by his wife: his adult daycare sent him home b/c he had temp 99 and they felt he was SOB.  She took him home and made him drink 20 oz of fluids (gatorade) and he was back to his baseline.  He has been having more and more these brief, odd behaviors which are hard to interpret (? Physical vs mental/dementia). Wife kept appt so I could check him out and also she has a question about a spot on left side of his mid back that he says is tender when she touches it during his bath.   ROS: no cough, no fever, no CP.  Past Medical History:  Diagnosis Date  . Actinic keratoses   . Adrenal hemorrhage ([place]) 2001   s/p fall from ladder--a f/u CT abd showed resolution of the hemorrhage- (duodenal polyp incidentally noted;  f/u EGD by Dr. Watt Climes 03/2000 showed small lipomatous mass in duodenum which was confirmed by biopsy.  . Alzheimer's dementia    with behav disturbance and apathy.  [facility] referral as of 07/[facility].  They added namenda 07/2014 due to pt's signif decline in the prior 6 mo.  . Arthralgia of knee, right 2009   exam suspicious for meniscal origin  . BPH (benign prostatic hypertrophy)    History of acute urinary retention (2006)  Hx of elevated PSA (7.26) + right sided prostate nodule on DRE by urologist.  PSA did not go down with tx with antibiotics--biopsy was done and showed NO MALIGNANCY but some chronic inflammation was present--turned out related to acute cystitis (enterococcus UTI).  PSA was 1.17 in 07/2011 at urologist's (Dr. Karsten Ro).  F/u at urol is now prn as of 08/2014.  Marland Kitchen BPPV (benign paroxysmal positional vertigo)   . Chronic renal insufficiency, stage II (mild) 2015   CrCl 60's  . DDD (degenerative disc disease), cervical    C5-6, C6-7 primarily  .  Headache in back of head    Neurologist treated him with PT for this problem and it helped  . Hearing deficit    Bilateral; noise damage in [employer]  Has had hearing aids since about 2008.  Marland Kitchen History of acute cystitis 08/2009   with acute prostatitis (enterococcus faecalis)  . History of solitary pulmonary nodule    Initially noted on CT abd for his adrenal hemorrhage s/p fall from ladder 2001.  F/u CT chest 04/2000 showed that it had shrunk from 6mm to 6mm and had no malignant features.  . History of TIA (transient ischemic attack) @ 2008   02/2001 and 01/2006 carotid dopplers showed 50% or less stenosis.  . Left bundle branch hemiblock    mentioned in dx of old records 04/15/2009  . Migraine headache with aura onset in teens   No migraines since about 2004  . Skin cancer     Past Surgical History:  Procedure Laterality Date  . Carotid dopplers  05/2016   1-39% stenosis bilat ICA.  . cataract extrac    . dental     implants  . PROSTATE BIOPSY  04/07/2005   For elevated PSA + right sided prostate nodule on DRE by urologist:  benign biopsy.  . TONSILLECTOMY  preteen  . TRANSTHORACIC ECHOCARDIOGRAM  06/28/2016   EF  60-65%, mild dilatation of ascending aorta.    Outpatient Medications Prior to Visit  Medication Sig Dispense Refill  . aspirin 325 MG EC tablet Take 1 tablet (325 mg total) by mouth daily. 90 tablet 3  . BIOTIN PO Take 1 capsule by mouth daily.    Marland Kitchen CALCIUM PO Take by mouth daily.     . CHOLECALCIFEROL PO Take by mouth.    . CYANOCOBALAMIN PO Take by mouth.    Marland Kitchen glucosamine-chondroitin 500-400 MG tablet Take 1 tablet by mouth daily.    Marland Kitchen MAGNESIUM PO Take by mouth daily.    . Omega-3 Fatty Acids (OMEGA 3 PO) Take 1 capsule by mouth.     . rivastigmine (EXELON) 4.6 mg/24hr     . tamsulosin (FLOMAX) 0.4 MG CAPS capsule Take 2 capsules (0.8 mg total) by mouth daily after supper. 180 capsule 3  . VITAMIN E PO Take 1 capsule by mouth daily.      No  facility-administered medications prior to visit.     No Known Allergies  ROS As per HPI  PE: Blood pressure 111/60, pulse 61, temperature 98.7 F (37.1 C), temperature source Oral, resp. rate 16, height 5\' 7"  (1.702 m), weight 146 lb 12 oz (66.6 kg), SpO2 96 %. Gen: Alert, well appearing.  Patient is oriented to person, general place, general situation. AFFECT: pleasant.   XBM:WUXL: no injection, icteris, swelling, or exudate.  EOMI, PERRLA. Mouth: lips without lesion/swelling.  Oral mucosa pink and moist. Oropharynx without erythema, exudate, or swelling.  CV: RRR, no m/r/g.   LUNGS: CTA bilat, nonlabored resps, good aeration in all lung fields. ABD: soft, NT/ND EXT: no clubbing, cyanosis, or edema.  Skin: many scattered small benign appearing skin lesions.  Left side at mid back level he has a pinkish small papule w/out surrounding erythema, no fluctuance, no sign of any drainage.  When this area is touched by his wife today he jumps and screams "dammit that hurts".  When I wisp the tip of my finger on his skin about 1 foot away from this spot, he has a similar reaction.     LABS:    Chemistry      Component Value Date/Time   NA 138 06/27/2016 0646   K 4.5 06/27/2016 0646   CL 103 06/27/2016 0646   CO2 25 06/27/2016 0646   BUN 16 06/27/2016 0646   CREATININE 0.98 06/27/2016 0646   CREATININE 1.02 09/05/2015 1528      Component Value Date/Time   CALCIUM 9.2 06/27/2016 0646   ALKPHOS 84 10/11/2014 1232   AST 23 10/11/2014 1232   ALT 16 10/11/2014 1232   BILITOT 0.7 10/11/2014 1232     CC UA today: trace LEU, otherwise normal.  IMPRESSION AND PLAN:  1) Behavioral changes/oddities c/w progressing alzheimer's dementia. I see no sign of any illness, and it is very unlikely that the small amount of gatorade she gave him was what made him return to his baseline.  Reassured wife. He has a very mildly abnl UA today so I sent his urine for c/s but will hold off on any abx at  this time.  2) Skin hypersensitivity: he has no focal lesion that needs any treatment.  The small papule his wife is concerned about is not infected.  It appears that it may be a bit inflamed.  His response to being touched anywhere on back or side (even a minimal "wisp" of touch which would barely be palpable makes him flinch  and yell).  I think this is behavioral --not a symptom of any organic problem in the region of the skin lesion.  Reassured wife.  Spent 25 min with pt today, with >50% of this time spent in counseling and care coordination regarding the above problems.  Discussed his symptoms in depth, what they could indicate, the likelihood of them being due to his dementia with behavioral disturbance, and the need for simple observation at this time.   An After Visit Summary was printed and given to the patient.  FOLLOW UP: Return as needed.  Signed:  Crissie Sickles, MD           10/29/2016

## 2016-10-29 NOTE — Telephone Encounter (Signed)
LM requesting call back to discuss symptoms (SOB and fever).

## 2016-10-30 LAB — URINE CULTURE: Organism ID, Bacteria: NO GROWTH

## 2016-11-08 ENCOUNTER — Ambulatory Visit: Payer: Medicare Other

## 2016-11-08 ENCOUNTER — Telehealth: Payer: Self-pay

## 2016-11-08 VITALS — BP 120/72 | HR 82 | Ht 67.0 in | Wt 149.4 lb

## 2016-11-08 DIAGNOSIS — Z0000 Encounter for general adult medical examination without abnormal findings: Secondary | ICD-10-CM

## 2016-11-08 DIAGNOSIS — F0391 Unspecified dementia with behavioral disturbance: Secondary | ICD-10-CM

## 2016-11-08 DIAGNOSIS — Z23 Encounter for immunization: Secondary | ICD-10-CM

## 2016-11-08 DIAGNOSIS — L602 Onychogryphosis: Secondary | ICD-10-CM

## 2016-11-08 MED ORDER — ZOSTER VAC RECOMB ADJUVANTED 50 MCG/0.5ML IM SUSR: 0.5000 mL | Freq: Once | INTRAMUSCULAR | 1 refills | Status: AC

## 2016-11-08 NOTE — Progress Notes (Signed)
Subjective:   Mitchell Abbott is a 83 y.o. male who presents for Medicare Annual/Subsequent preventive examination. Accompanied by wife, [name]. Wife answered all questions d/t dementia.   Review of Systems:  No ROS.  Medicare Wellness Visit. Additional risk factors are reflected in the social history.  Cardiac Risk Factors include: advanced age (>7men, >1 women)   Sleep patterns: Sleeps 6 hours, interrupted. Nocturia x 5. Naps during the day.  Home Safety/Smoke Alarms: Feels safe in home. Smoke alarms in place.  Living environment; residence and Firearm Safety: Lives with wife in 1 story home with basement. Rail at steps.  Seat Belt Safety/Bike Helmet: Wears seat belt.   Counseling:   Eye Exam-Last exam 04/2016 Dental-Last exam < 6 months. Dr. Jarrett Soho  Male:   CCS-None.   DEXA-07/17/2012, normal.      PSA-  Lab Results  Component Value Date   PSA 1.73 08/24/2013   PSA 1.30 01/05/2012       Objective:    Vitals: BP 120/72 (BP Location: Right Arm, Patient Position: Sitting, Cuff Size: Normal)   Pulse 82   Ht 5\' 7"  (1.702 m)   Wt 149 lb 6.4 oz (67.8 kg)   SpO2 96%   BMI 23.40 kg/m   Body mass index is 23.4 kg/m.  Tobacco History  Smoking Status  . Former Smoker  . Years: 2.00  . Types: Cigarettes  Smokeless Tobacco  . Never Used     Counseling given: Not Answered   Past Medical History:  Diagnosis Date  . Actinic keratoses   . Adrenal hemorrhage ([place]) 2001   s/p fall from ladder--a f/u CT abd showed resolution of the hemorrhage- (duodenal polyp incidentally noted;  f/u EGD by Dr. Watt Climes 03/2000 showed small lipomatous mass in duodenum which was confirmed by biopsy.  . Alzheimer's dementia    with behav disturbance and apathy.  [facility] referral as of 07/2013.  They added namenda 07/2014 due to pt's signif decline in the prior 6 mo.  . Arthralgia of knee, right 2009   exam suspicious for meniscal origin  . BPH (benign prostatic hypertrophy)    History of acute urinary retention (2006)  Hx of elevated PSA (7.26) + right sided prostate nodule on DRE by urologist.  PSA did not go down with tx with antibiotics--biopsy was done and showed NO MALIGNANCY but some chronic inflammation was present--turned out related to acute cystitis (enterococcus UTI).  PSA was 1.17 in 07/2011 at urologist's (Dr. Karsten Ro).  F/u at urol is now prn as of 08/2014.  Marland Kitchen BPPV (benign paroxysmal positional vertigo)   . Chronic renal insufficiency, stage II (mild) 2015   CrCl 60's  . DDD (degenerative disc disease), cervical    C5-6, C6-7 primarily  . Headache in back of head    Neurologist treated him with PT for this problem and it helped  . Hearing deficit    Bilateral; noise damage in [employer]  Has had hearing aids since about 2008.  Marland Kitchen History of acute cystitis 08/2009   with acute prostatitis (enterococcus faecalis)  . History of solitary pulmonary nodule    Initially noted on CT abd for his adrenal hemorrhage s/p fall from ladder 2001.  F/u CT chest 04/2000 showed that it had shrunk from 65mm to 6mm and had no malignant features.  . History of TIA (transient ischemic attack) @ 2008   02/2001 and 01/2006 carotid dopplers showed 50% or less stenosis.  . Left bundle branch hemiblock  mentioned in dx of old records 04/15/2009  . Migraine headache with aura onset in teens   No migraines since about 2004  . Skin cancer    Past Surgical History:  Procedure Laterality Date  . Carotid dopplers  05/2016   1-39% stenosis bilat ICA.  . cataract extrac    . dental     implants  . PROSTATE BIOPSY  04/07/2005   For elevated PSA + right sided prostate nodule on DRE by urologist:  benign biopsy.  . TONSILLECTOMY  preteen  . TRANSTHORACIC ECHOCARDIOGRAM  06/28/2016   EF 60-65%, mild dilatation of ascending aorta.   Family History  Problem Relation Age of Onset  . Sudden death Mother 64       FH info obtained from Dr. Ovid Curd records.  . Breast cancer Daughter     . Migraines Daughter    History  Sexual Activity  . Sexual activity: Not on file    Outpatient Encounter Prescriptions as of 11/08/2016  Medication Sig  . aspirin 325 MG EC tablet Take 1 tablet (325 mg total) by mouth daily.  Marland Kitchen BIOTIN PO Take 1 capsule by mouth daily.  Marland Kitchen CALCIUM PO Take by mouth daily.   . CHOLECALCIFEROL PO Take by mouth.  . CYANOCOBALAMIN PO Take by mouth.  Marland Kitchen glucosamine-chondroitin 500-400 MG tablet Take 1 tablet by mouth daily.  Marland Kitchen MAGNESIUM PO Take by mouth daily.  . Omega-3 Fatty Acids (OMEGA 3 PO) Take 1 capsule by mouth.   . rivastigmine (EXELON) 4.6 mg/24hr   . tamsulosin (FLOMAX) 0.4 MG CAPS capsule Take 2 capsules (0.8 mg total) by mouth daily after supper.  Marland Kitchen VITAMIN E PO Take 1 capsule by mouth daily.    No facility-administered encounter medications on file as of 11/08/2016.     Activities of Daily Living In your present state of health, do you have any difficulty performing the following activities: 11/08/2016 06/27/2016  Hearing? Y N  Vision? N N  Difficulty concentrating or making decisions? Y N  Walking or climbing stairs? N N  Dressing or bathing? Y N  Doing errands, shopping? Y N  Preparing Food and eating ? Y -  Using the Toilet? N -  In the past six months, have you accidently leaked urine? Y -  Do you have problems with loss of bowel control? N -  Managing your Medications? Y -  Managing your Finances? Y -  Housekeeping or managing your Housekeeping? Y -  Some recent data might be hidden  Wife assist with ADL's, managing medications and finances d/t demenita/cognitive limitations.   Patient Care Team: Tammi Sou, MD as PCP - General (Family Medicine) Marcial Pacas, MD as Attending Physician (Neurology) Kathie Rhodes, MD as Consulting Physician (Urology) Freda Munro, MD as Consulting Physician (Gerontology) Murvin Donning, MD (Dermatology)   Assessment:    Physical assessment deferred to PCP.  Exercise Activities  and Dietary recommendations Current Exercise Habits: Structured exercise class (Adult day care physical activity), Type of exercise: strength training/weights;walking, Time (Minutes): 60, Frequency (Times/Week): 5, Weekly Exercise (Minutes/Week): 300, Exercise limited by: neurologic condition(s);psychological condition(s)   Diet (meal preparation, eat out, water intake, caffeinated beverages, dairy products, fruits and vegetables): Drinks water.   Breakfast: fruit, yogurt, coffee, oatmeal Lunch: meat, vegetables, fruit Dinner: meat, vegetables, rice     Goals      Patient Stated   . <enter goal here> (pt-stated)          Wife states quality of  life.       Fall Risk Fall Risk  11/08/2016 10/22/2014  Falls in the past year? Yes No  Number falls in past yr: 2 or more -  Injury with Fall? No -  Risk for fall due to : Medication side effect;Impaired balance/gait;Mental status change -  Follow up Falls prevention discussed -   Depression Screen PHQ 2/9 Scores 11/08/2016 10/22/2014  PHQ - 2 Score - 1  Exception Documentation Medical reason -    Cognitive Function MMSE - Mini Mental State Exam 02/20/2014  Orientation to time 4  Orientation to Place 5  Registration 3  Attention/ Calculation 5  Recall 3  Language- name 2 objects 2  Language- repeat 1  Language- follow 3 step command 3  Language- read & follow direction 1  Write a sentence 1  Copy design 1  Total score 29        Immunization History  Administered Date(s) Administered  . Influenza Split 06/10/2011  . Influenza, High Dose Seasonal PF 04/18/2015, 03/04/2016  . Influenza,inj,Quad PF,36+ Mos 07/04/2014  . Pneumococcal Conjugate-13 08/24/2013  . Pneumococcal Polysaccharide-23 01/05/2012  . Tdap 08/24/2013   Screening Tests Health Maintenance  Topic Date Due  . INFLUENZA VACCINE  12/29/2016  . TETANUS/TDAP  08/25/2023  . PNA vac Low Risk Adult  Completed      Plan:    I have personally reviewed and noted  the following in the patient's chart:   . Medical and social history . Use of alcohol, tobacco or illicit drugs  . Current medications and supplements . Functional ability and status . Nutritional status . Physical activity . Advanced directives . List of other physicians . Hospitalizations, surgeries, and ER visits in previous 12 months . Vitals . Screenings to include cognitive, depression, and falls . Referrals and appointments  In addition, I have reviewed and discussed with patient certain preventive protocols, quality metrics, and best practice recommendations. A written personalized care plan for preventive services as well as general preventive health recommendations were provided to patient.     Gerilyn Nestle, RN  11/08/2016  PCP Note: -Wife requesting referral to podiatry (telephone note sent) -Left eye drainage, has improved today.  -Shingrix Rx sent to pharmacy -F/U appt 11/11/16

## 2016-11-08 NOTE — Telephone Encounter (Signed)
OK, referral ordered--thx

## 2016-11-08 NOTE — Telephone Encounter (Signed)
Patient in for AWV with [facility] today. Wife requesting referral to podiatry for nail care.

## 2016-11-08 NOTE — Patient Instructions (Signed)
Fall Prevention in the Home Falls can cause injuries. They can happen to people of all ages. There are many things you can do to make your home safe and to help prevent falls. What can I do on the outside of my home?  Regularly fix the edges of walkways and driveways and fix any cracks.  Remove anything that might make you trip as you walk through a door, such as a raised step or threshold.  Trim any bushes or trees on the path to your home.  Use bright outdoor lighting.  Clear any walking paths of anything that might make someone trip, such as rocks or tools.  Regularly check to see if handrails are loose or broken. Make sure that both sides of any steps have handrails.  Any raised decks and porches should have guardrails on the edges.  Have any leaves, snow, or ice cleared regularly.  Use sand or salt on walking paths during winter.  Clean up any spills in your garage right away. This includes oil or grease spills. What can I do in the bathroom?  Use night lights.  Install grab bars by the toilet and in the tub and shower. Do not use towel bars as grab bars.  Use non-skid mats or decals in the tub or shower.  If you need to sit down in the shower, use a plastic, non-slip stool.  Keep the floor dry. Clean up any water that spills on the floor as soon as it happens.  Remove soap buildup in the tub or shower regularly.  Attach bath mats securely with double-sided non-slip rug tape.  Do not have throw rugs and other things on the floor that can make you trip. What can I do in the bedroom?  Use night lights.  Make sure that you have a light by your bed that is easy to reach.  Do not use any sheets or blankets that are too big for your bed. They should not hang down onto the floor.  Have a firm chair that has side arms. You can use this for support while you get dressed.  Do not have throw rugs and other things on the floor that can make you trip. What can I do in  the kitchen?  Clean up any spills right away.  Avoid walking on wet floors.  Keep items that you use a lot in easy-to-reach places.  If you need to reach something above you, use a strong step stool that has a grab bar.  Keep electrical cords out of the way.  Do not use floor polish or wax that makes floors slippery. If you must use wax, use non-skid floor wax.  Do not have throw rugs and other things on the floor that can make you trip. What can I do with my stairs?  Do not leave any items on the stairs.  Make sure that there are handrails on both sides of the stairs and use them. Fix handrails that are broken or loose. Make sure that handrails are as long as the stairways.  Check any carpeting to make sure that it is firmly attached to the stairs. Fix any carpet that is loose or worn.  Avoid having throw rugs at the top or bottom of the stairs. If you do have throw rugs, attach them to the floor with carpet tape.  Make sure that you have a light switch at the top of the stairs and the bottom of the stairs. If you  do not have them, ask someone to add them for you. What else can I do to help prevent falls?  Wear shoes that: ? Do not have high heels. ? Have rubber bottoms. ? Are comfortable and fit you well. ? Are closed at the toe. Do not wear sandals.  If you use a stepladder: ? Make sure that it is fully opened. Do not climb a closed stepladder. ? Make sure that both sides of the stepladder are locked into place. ? Ask someone to hold it for you, if possible.  Clearly mark and make sure that you can see: ? Any grab bars or handrails. ? First and last steps. ? Where the edge of each step is.  Use tools that help you move around (mobility aids) if they are needed. These include: ? Canes. ? Walkers. ? Scooters. ? Crutches.  Turn on the lights when you go into a dark area. Replace any light bulbs as soon as they burn out.  Set up your furniture so you have a clear  path. Avoid moving your furniture around.  If any of your floors are uneven, fix them.  If there are any pets around you, be aware of where they are.  Review your medicines with your doctor. Some medicines can make you feel dizzy. This can increase your chance of falling. Ask your doctor what other things that you can do to help prevent falls. This information is not intended to replace advice given to you by your health care provider. Make sure you discuss any questions you have with your health care provider. Document Released: 03/13/2009 Document Revised: 10/23/2015 Document Reviewed: 06/21/2014 Elsevier Interactive Patient Education  2018 Elsevier Inc.   Health Maintenance, Male A healthy lifestyle and preventive care is important for your health and wellness. Ask your health care provider about what schedule of regular examinations is right for you. What should I know about weight and diet? Eat a Healthy Diet  Eat plenty of vegetables, fruits, whole grains, low-fat dairy products, and lean protein.  Do not eat a lot of foods high in solid fats, added sugars, or salt.  Maintain a Healthy Weight Regular exercise can help you achieve or maintain a healthy weight. You should:  Do at least 150 minutes of exercise each week. The exercise should increase your heart rate and make you sweat (moderate-intensity exercise).  Do strength-training exercises at least twice a week.  Watch Your Levels of Cholesterol and Blood Lipids  Have your blood tested for lipids and cholesterol every 5 years starting at 81 years of age. If you are at high risk for heart disease, you should start having your blood tested when you are 81 years old. You may need to have your cholesterol levels checked more often if: ? Your lipid or cholesterol levels are high. ? You are older than 81 years of age. ? You are at high risk for heart disease.  What should I know about cancer screening? Many types of cancers  can be detected early and may often be prevented. Lung Cancer  You should be screened every year for lung cancer if: ? You are a current smoker who has smoked for at least 30 years. ? You are a former smoker who has quit within the past 15 years.  Talk to your health care provider about your screening options, when you should start screening, and how often you should be screened.  Colorectal Cancer  Routine colorectal cancer screening usually begins at   81 years of age and should be repeated every 5-10 years until you are 81 years old. You may need to be screened more often if early forms of precancerous polyps or small growths are found. Your health care provider may recommend screening at an earlier age if you have risk factors for colon cancer.  Your health care provider may recommend using home test kits to check for hidden blood in the stool.  A small camera at the end of a tube can be used to examine your colon (sigmoidoscopy or colonoscopy). This checks for the earliest forms of colorectal cancer.  Prostate and Testicular Cancer  Depending on your age and overall health, your health care provider may do certain tests to screen for prostate and testicular cancer.  Talk to your health care provider about any symptoms or concerns you have about testicular or prostate cancer.  Skin Cancer  Check your skin from head to toe regularly.  Tell your health care provider about any new moles or changes in moles, especially if: ? There is a change in a mole's size, shape, or color. ? You have a mole that is larger than a pencil eraser.  Always use sunscreen. Apply sunscreen liberally and repeat throughout the day.  Protect yourself by wearing long sleeves, pants, a wide-brimmed hat, and sunglasses when outside.  What should I know about heart disease, diabetes, and high blood pressure?  If you are 18-39 years of age, have your blood pressure checked every 3-5 years. If you are 40 years  of age or older, have your blood pressure checked every year. You should have your blood pressure measured twice-once when you are at a hospital or clinic, and once when you are not at a hospital or clinic. Record the average of the two measurements. To check your blood pressure when you are not at a hospital or clinic, you can use: ? An automated blood pressure machine at a pharmacy. ? A home blood pressure monitor.  Talk to your health care provider about your target blood pressure.  If you are between 45-79 years old, ask your health care provider if you should take aspirin to prevent heart disease.  Have regular diabetes screenings by checking your fasting blood sugar level. ? If you are at a normal weight and have a low risk for diabetes, have this test once every three years after the age of 45. ? If you are overweight and have a high risk for diabetes, consider being tested at a younger age or more often.  A one-time screening for abdominal aortic aneurysm (AAA) by ultrasound is recommended for men aged 65-75 years who are current or former smokers. What should I know about preventing infection? Hepatitis B If you have a higher risk for hepatitis B, you should be screened for this virus. Talk with your health care provider to find out if you are at risk for hepatitis B infection. Hepatitis C Blood testing is recommended for:  Everyone born from 1945 through 1965.  Anyone with known risk factors for hepatitis C.  Sexually Transmitted Diseases (STDs)  You should be screened each year for STDs including gonorrhea and chlamydia if: ? You are sexually active and are younger than 81 years of age. ? You are older than 81 years of age and your health care provider tells you that you are at risk for this type of infection. ? Your sexual activity has changed since you were last screened and you are at an   increased risk for chlamydia or gonorrhea. Ask your health care provider if you are at  risk.  Talk with your health care provider about whether you are at high risk of being infected with HIV. Your health care provider may recommend a prescription medicine to help prevent HIV infection.  What else can I do?  Schedule regular health, dental, and eye exams.  Stay current with your vaccines (immunizations).  Do not use any tobacco products, such as cigarettes, chewing tobacco, and e-cigarettes. If you need help quitting, ask your health care provider.  Limit alcohol intake to no more than 2 drinks per day. One drink equals 12 ounces of beer, 5 ounces of wine, or 1 ounces of hard liquor.  Do not use street drugs.  Do not share needles.  Ask your health care provider for help if you need support or information about quitting drugs.  Tell your health care provider if you often feel depressed.  Tell your health care provider if you have ever been abused or do not feel safe at home. This information is not intended to replace advice given to you by your health care provider. Make sure you discuss any questions you have with your health care provider. Document Released: 11/13/2007 Document Revised: 01/14/2016 Document Reviewed: 02/18/2015 Elsevier Interactive Patient Education  2018 Elsevier Inc.  

## 2016-11-11 ENCOUNTER — Ambulatory Visit (INDEPENDENT_AMBULATORY_CARE_PROVIDER_SITE_OTHER): Payer: Medicare Other | Admitting: Family Medicine

## 2016-11-11 ENCOUNTER — Encounter: Payer: Self-pay | Admitting: Family Medicine

## 2016-11-11 VITALS — BP 74/54 | HR 68 | Temp 97.4°F | Resp 16 | Ht 67.0 in | Wt 146.2 lb

## 2016-11-11 DIAGNOSIS — F0281 Dementia in other diseases classified elsewhere with behavioral disturbance: Secondary | ICD-10-CM

## 2016-11-11 DIAGNOSIS — N182 Chronic kidney disease, stage 2 (mild): Secondary | ICD-10-CM

## 2016-11-11 DIAGNOSIS — N138 Other obstructive and reflux uropathy: Secondary | ICD-10-CM

## 2016-11-11 DIAGNOSIS — L602 Onychogryphosis: Secondary | ICD-10-CM | POA: Diagnosis not present

## 2016-11-11 DIAGNOSIS — Z0000 Encounter for general adult medical examination without abnormal findings: Secondary | ICD-10-CM

## 2016-11-11 DIAGNOSIS — I959 Hypotension, unspecified: Secondary | ICD-10-CM | POA: Diagnosis not present

## 2016-11-11 DIAGNOSIS — B353 Tinea pedis: Secondary | ICD-10-CM

## 2016-11-11 DIAGNOSIS — N401 Enlarged prostate with lower urinary tract symptoms: Secondary | ICD-10-CM

## 2016-11-11 DIAGNOSIS — G309 Alzheimer's disease, unspecified: Secondary | ICD-10-CM | POA: Diagnosis not present

## 2016-11-11 DIAGNOSIS — L089 Local infection of the skin and subcutaneous tissue, unspecified: Secondary | ICD-10-CM | POA: Diagnosis not present

## 2016-11-11 MED ORDER — MUPIROCIN 2 % EX OINT: TOPICAL_OINTMENT | CUTANEOUS | 0 refills | Status: DC

## 2016-11-11 NOTE — Progress Notes (Signed)
OFFICE VISIT  11/11/2016   CC:  Chief Complaint  Patient presents with  . Follow-up    RCI, pt is not fasting.    HPI:    Patient is a 83 y.o. Caucasian male who presents for f/u alzheimer's dementia with behavioral disturbance, BPH, hx of orthostatic dizziness (believed to be secondary to alpha blocker), and CRI stage II (GFR 60s).  Excessive talking, repeating himself, repeating gibberish, very loudly. Only rare wandering.  Most recent f/u with neurologist was a couple months ago---has a new one at the [place]. Wife recently d/c'd memantine b/c of dizziness side effect.  No further dizziness since stopping this med. He exercises at gym/senior center, walking, etc.  No home bp monitoring.  No c/o dizziness, no presyncope or syncope.   Takes 2 flomax; seems to help him empty but still has lots of incomplete emptying and nocturia.  Still with tender pink bump/pustule in L mid back area.  Wife says it is improving but he still jumps when it is touched when she bathes him.  ROS: no CP, no SOB, no diaphoresis, no n/v, no LE swelling.  Toenails long/thick, some flaking of bottom of feet---chronic.    Past Medical History:  Diagnosis Date  . Actinic keratoses   . Adrenal hemorrhage ([place]) 2001   s/p fall from ladder--a f/u CT abd showed resolution of the hemorrhage- (duodenal polyp incidentally noted;  f/u EGD by Dr. Watt Climes 03/2000 showed small lipomatous mass in duodenum which was confirmed by biopsy.  . Alzheimer's dementia    with behav disturbance and apathy.  [facility] referral as of 07[facility].  They added namenda 07/2014 due to pt's signif decline in the prior 6 mo.  . Arthralgia of knee, right 2009   exam suspicious for meniscal origin  . BPH (benign prostatic hypertrophy)    History of acute urinary retention (2006)  Hx of elevated PSA (7.26) + right sided prostate nodule on DRE by urologist.  PSA did not go down with tx with antibiotics--biopsy was done and showed NO MALIGNANCY  but some chronic inflammation was present--turned out related to acute cystitis (enterococcus UTI).  PSA was 1.17 in 07/2011 at urologist's (Dr. Karsten Ro).  F/u at urol is now prn as of 08/2014.  Marland Kitchen BPPV (benign paroxysmal positional vertigo)   . Chronic renal insufficiency, stage II (mild) 2015   CrCl 60's  . DDD (degenerative disc disease), cervical    C5-6, C6-7 primarily  . Headache in back of head    Neurologist treated him with PT for this problem and it helped  . Hearing deficit    Bilateral; noise damage in [employer]  Has had hearing aids since about 2008.  Marland Kitchen History of acute cystitis 08/2009   with acute prostatitis (enterococcus faecalis)  . History of solitary pulmonary nodule    Initially noted on CT abd for his adrenal hemorrhage s/p fall from ladder 2001.  F/u CT chest 04/2000 showed that it had shrunk from 83mm to 75mm and had no malignant features.  . History of TIA (transient ischemic attack) @ 2008   02/2001 and 01/2006 carotid dopplers showed 50% or less stenosis.  . Left bundle branch hemiblock    mentioned in dx of old records 04/15/2009  . Migraine headache with aura onset in teens   No migraines since about 2004  . Skin cancer     Past Surgical History:  Procedure Laterality Date  . Carotid dopplers  05/2016   1-39% stenosis bilat ICA.  Marland Kitchen  cataract extrac    . dental     implants  . PROSTATE BIOPSY  04/07/2005   For elevated PSA + right sided prostate nodule on DRE by urologist:  benign biopsy.  . TONSILLECTOMY  preteen  . TRANSTHORACIC ECHOCARDIOGRAM  06/28/2016   EF 60-65%, mild dilatation of ascending aorta.    Outpatient Medications Prior to Visit  Medication Sig Dispense Refill  . aspirin 325 MG EC tablet Take 1 tablet (325 mg total) by mouth daily. 90 tablet 3  . BIOTIN PO Take 1 capsule by mouth daily.    Marland Kitchen CALCIUM PO Take by mouth daily.     . CHOLECALCIFEROL PO Take by mouth.    . CYANOCOBALAMIN PO Take by mouth.    Marland Kitchen glucosamine-chondroitin 500-400  MG tablet Take 1 tablet by mouth daily.    Marland Kitchen MAGNESIUM PO Take by mouth daily.    . Omega-3 Fatty Acids (OMEGA 3 PO) Take 1 capsule by mouth.     . rivastigmine (EXELON) 4.6 mg/24hr     . tamsulosin (FLOMAX) 0.4 MG CAPS capsule Take 2 capsules (0.8 mg total) by mouth daily after supper. 180 capsule 3  . VITAMIN E PO Take 1 capsule by mouth daily.      No facility-administered medications prior to visit.     No Known Allergies  ROS As per HPI  PE: Blood pressure (!) 74/54, pulse 68, temperature 97.4 F (36.3 C), temperature source Oral, resp. rate 16, height 5\' 7"  (1.702 m), weight 146 lb 4 oz (66.3 kg), SpO2 94 %. Body mass index is 22.91 kg/m. Repeat bp L arm 88/78, R arm 74/54--pt talking during bp check, difficult to hear bp sounds.  Gen: Alert, well appearing.  Patient is oriented to person and general situation. Affect: some flat, some gregarious, some angry--occasionally says appropriate things. Follows commands. EYC:XKGY: no injection, icteris, swelling, or exudate.  EOMI, PERRLA. Mouth: lips without lesion/swelling.  Oral mucosa pink and moist. Oropharynx without erythema, exudate, or swelling.  CV: RRR, no m/r/g.  Radial, carotid, and DP/PT pulses all 2+. LUNGS: CTA bilat, nonlabored resps, good aeration in all lung fields. EXT: no clubbing, cyanosis, or edema.  Feet: some mild pinkish hue to skin on plantar surfaces, with some superficial peeling.  Toenails thick and long. SKIN: left side/mid back with small pink papule with scabbed top.  No surrounding erythema.  He jumps and yells when his wife touches this today.  LABS:    Chemistry      Component Value Date/Time   NA 138 06/27/2016 0646   K 4.5 06/27/2016 0646   CL 103 06/27/2016 0646   CO2 25 06/27/2016 0646   BUN 16 06/27/2016 0646   CREATININE 0.98 06/27/2016 0646   CREATININE 1.02 09/05/2015 1528      Component Value Date/Time   CALCIUM 9.2 06/27/2016 0646   ALKPHOS 84 10/11/2014 1232   AST 23  10/11/2014 1232   ALT 16 10/11/2014 1232   BILITOT 0.7 10/11/2014 1232     Lab Results  Component Value Date   PSA 1.73 08/24/2013   PSA 1.30 01/05/2012   Lab Results  Component Value Date   WBC 5.4 06/28/2016   HGB 14.2 06/28/2016   HCT 42.2 06/28/2016   MCV 97.5 06/28/2016   PLT 255 06/28/2016   Lab Results  Component Value Date   CHOL 168 10/22/2014   HDL 53.10 10/22/2014   LDLCALC 97 10/22/2014   TRIG 91.0 10/22/2014   CHOLHDL 3 10/22/2014  Lab Results  Component Value Date   TSH 1.41 10/13/2011   IMPRESSION AND PLAN:  1) Dementia: progressive, with behav disturbance. Continue exelon, [facility] f[facility]), adult daycare, mental/physical stimulation.  2) BPH: sx's improved on flomax but not ideal.  NO other meds have helped any better, and he has had some orthostatic dizziness with some of them.  Continue flomax 8 mg qhs.  3) CRI stage II: lytes/cr good 6 mo ago.  Will recheck in 6 mo.  4) Hypotension: he is asymptomatic.  He had an echo 05/2016 that showed normal EF and no valvular stenosis or insufficiency.  Peripheral pulses all good.  No further w/u at this time. Wife will get bp cuff to monitor bp qod at home and prn if he is acting dizzy or complaining of acute fatigue or other concerning symptom.  5) Left mid back/side skin pustule: improving slowly with no treatment.  Will rx bactroban ointment to apply tid x 10d.  6) Preventative health care: shingrix vaccine rx handed to pt today.  7) Nail hypertrophy + onychomycosis and tinea pedis: have referred to podiatrist for nail care. Recommended lamisil otc bid to feet.  An After Visit Summary was printed and given to the patient.  FOLLOW UP: Return in about 6 months (around 05/13/2017) for routine chronic illness f/u.  Signed:  Crissie Sickles, MD           11/11/2016

## 2016-11-24 ENCOUNTER — Ambulatory Visit: Payer: Medicare Other | Admitting: Podiatry

## 2016-12-08 NOTE — Progress Notes (Signed)
AWV reviewed and agree.  Signed:  Crissie Sickles, MD           12/08/2016

## 2016-12-16 ENCOUNTER — Encounter: Payer: Self-pay | Admitting: Podiatry

## 2016-12-16 ENCOUNTER — Ambulatory Visit (INDEPENDENT_AMBULATORY_CARE_PROVIDER_SITE_OTHER): Payer: Medicare Other | Admitting: Podiatry

## 2016-12-16 DIAGNOSIS — M79674 Pain in right toe(s): Secondary | ICD-10-CM

## 2016-12-16 DIAGNOSIS — M79675 Pain in left toe(s): Secondary | ICD-10-CM | POA: Diagnosis not present

## 2016-12-16 DIAGNOSIS — B351 Tinea unguium: Secondary | ICD-10-CM | POA: Diagnosis not present

## 2016-12-16 DIAGNOSIS — B353 Tinea pedis: Secondary | ICD-10-CM

## 2016-12-20 ENCOUNTER — Other Ambulatory Visit: Payer: Self-pay

## 2016-12-22 NOTE — Progress Notes (Signed)
Subjective:    Patient ID: Mitchell Abbott, male   DOB: 83 y.o.   MRN: [id number]   HPI Mitchell Abbott Presents the also concerns of thick, painful, elongated toenails that he cannot trim himself. He denies any swelling redness or any drainage from the toenail sites. Also states that he may have some athlete's foot. At times his feet do itch. Denies any open sores. He has no other concerns today.  Review of Systems  All other systems reviewed and are negative.       Objective:  Physical Exam General: AAO x3, NAD  Dermatological: Nails are hypertrophic, dystrophic, brittle, discolored, elongated 10. No surrounding redness or drainage. Tenderness nails 1-5 bilaterally. No open lesions or pre-ulcerative lesions are identified today. Mild tinea pedis present interdigitally with macerated tissue on the interspaces.  Vascular: Dorsalis Pedis artery and Posterior Tibial artery pedal pulses are palpable bilateral with immedate capillary fill time. Pedal hair growth present. There is no pain with calf compression, swelling, warmth, erythema.   Neruologic: Grossly intact via light touch bilateral. Protective threshold with Semmes Wienstein monofilament intact to all pedal sites bilateral.  Musculoskeletal: No gross boney pedal deformities bilateral. No pain, crepitus, or limitation noted with foot and ankle range of motion bilateral. Muscular strength 5/5 in all groups tested bilateral.   Assessment:     83 year old male with symptomatic onychomycosis, mild tinea pedis    Plan:     -Treatment options discussed including all alternatives, risks, and complications -Etiology of symptoms were discussed -Nails debrided 10 without complications or bleeding. -Dry thoroughly between the toes. His Pain. -Recommended Lotrimin cream for tinea pedis.  -Daily foot inspection -Follow-up in 3 months or sooner if any problems arise. In the meantime, encouraged to call the office with any questions,  concerns, change in symptoms.   Celesta Gentile, DPM

## 2016-12-29 DIAGNOSIS — C44729 Squamous cell carcinoma of skin of left lower limb, including hip: Secondary | ICD-10-CM | POA: Diagnosis not present

## 2017-01-03 ENCOUNTER — Telehealth: Payer: Self-pay | Admitting: Family Medicine

## 2017-01-03 MED ORDER — TRAZODONE HCL 50 MG PO TABS: ORAL_TABLET | ORAL | 1 refills | Status: DC

## 2017-01-03 NOTE — Telephone Encounter (Signed)
Patient's wife, [name] calling to request script for sleep medication for patient.  She states patient is constantly up several times during the night.  She has tried giving him melatonin which helps him fall asleep.  However, it  does not help with keeping he sleep during the night.  Local Pharmacy: [facility] [phone] - [place], [place] AT [address] [phone] (Phone) [phone] Paramedic)    Mail Order Pharmacy: [facility] - [place], [place] - [address] [phone] (Phone) [phone] (Fax)

## 2017-01-03 NOTE — Telephone Encounter (Signed)
Patient's wife aware of new RX sent to pharmacy and directions to take it 1 QHS w/ Melatonin, pt's wife verbalized understanding.

## 2017-01-03 NOTE — Telephone Encounter (Signed)
Trazodone 50mg  rx sent to local pharmacy. Take 1 tab at bedtime with the melatonin.

## 2017-01-03 NOTE — Telephone Encounter (Signed)
Please advise. Thanks.  

## 2017-01-14 ENCOUNTER — Telehealth: Payer: Self-pay

## 2017-01-14 NOTE — Telephone Encounter (Signed)
Patient's wife notified to try 1/2 tablet before bedtime to see if this helps with dizziness in the morning. Mrs. Turnley stated that she will try this.

## 2017-01-14 NOTE — Telephone Encounter (Signed)
I agree. Tell them he can likely still get good help with his insomnia but less morning dizziness if he tries 1/2 of the trazodone 50mg  tab at bedtime (if they haven't already tried this).-thx

## 2017-01-14 NOTE — Telephone Encounter (Signed)
Patient's wife cabe by office to inquire if the Trazodone could be causing dizziness in the morning.  Roderic Ovens, RN consulted and patient's wife was informed that this medication can cause dizziness.  If this is causing a problem patient can schedule an appointment to discuss with PCP. Wife stated that medication was helping greatly with sleep and would wait on appointment at this time.

## 2017-01-17 ENCOUNTER — Ambulatory Visit (INDEPENDENT_AMBULATORY_CARE_PROVIDER_SITE_OTHER): Payer: Medicare Other | Admitting: Family Medicine

## 2017-01-17 ENCOUNTER — Encounter: Payer: Self-pay | Admitting: Family Medicine

## 2017-01-17 ENCOUNTER — Telehealth: Payer: Self-pay | Admitting: Family Medicine

## 2017-01-17 ENCOUNTER — Telehealth: Payer: Self-pay

## 2017-01-17 VITALS — BP 121/76 | HR 69 | Temp 98.9°F | Resp 16 | Ht 67.0 in | Wt 145.8 lb

## 2017-01-17 DIAGNOSIS — R509 Fever, unspecified: Secondary | ICD-10-CM

## 2017-01-17 DIAGNOSIS — N3001 Acute cystitis with hematuria: Secondary | ICD-10-CM | POA: Diagnosis not present

## 2017-01-17 LAB — POCT URINALYSIS DIPSTICK
Bilirubin, UA: NEGATIVE
Glucose, UA: NEGATIVE
Ketones, UA: NEGATIVE
Nitrite, UA: POSITIVE
Protein, UA: 30
Spec Grav, UA: 1.02 (ref 1.010–1.025)
Urobilinogen, UA: 0.2 E.U./dL
pH, UA: 6.5 (ref 5.0–8.0)

## 2017-01-17 MED ORDER — CEFDINIR 300 MG PO CAPS: 300.0000 mg | ORAL_CAPSULE | Freq: Two times a day (BID) | ORAL | 0 refills | Status: DC

## 2017-01-17 NOTE — Progress Notes (Signed)
OFFICE VISIT  01/17/2017   CC:  Chief Complaint  Patient presents with  . Fever    101.35F   HPI:    Patient is a 83 y.o. Caucasian male who presents for fever. Yesterday at 7 PM his wife noted he was very hot, T 101.4.  He was breathing fast/heavy with this. BP 179/71 at that time.  No cough.  No n/v.  No appetite since last night. Wife gave him 750 mg ASA and after 2h, temp was same, then this morning it was 99 and he did not feel hot. BP recheck this morning 142/79.  Some recent constipation.  Prunes helped with this.   No known sick contacts recently. No urinary complaints lately.  Past Medical History:  Diagnosis Date  . Actinic keratoses   . Adrenal hemorrhage ([place]) 2001   s/p fall from ladder--a f/u CT abd showed resolution of the hemorrhage- (duodenal polyp incidentally noted;  f/u EGD by Dr. Watt Climes 03/2000 showed small lipomatous mass in duodenum which was confirmed by biopsy.  . Alzheimer's dementia    with behav disturbance and apathy.  [facility] referral as of [facility].  They added namenda 07/2014 due to pt's signif decline in the prior 6 mo.  . Arthralgia of knee, right 2009   exam suspicious for meniscal origin  . BPH (benign prostatic hypertrophy)    History of acute urinary retention (2006)  Hx of elevated PSA (7.26) + right sided prostate nodule on DRE by urologist.  PSA did not go down with tx with antibiotics--biopsy was done and showed NO MALIGNANCY but some chronic inflammation was present--turned out related to acute cystitis (enterococcus UTI).  PSA was 1.17 in 07/2011 at urologist's (Dr. Karsten Ro).  F/u at urol is now prn as of 08/2014.  Marland Kitchen BPPV (benign paroxysmal positional vertigo)   . Chronic renal insufficiency, stage II (mild) 2015   CrCl 60's  . DDD (degenerative disc disease), cervical    C5-6, C6-7 primarily  . Headache in back of head    Neurologist treated him with PT for this problem and it helped  . Hearing deficit    Bilateral; noise damage  in [employer].  Has had hearing aids since about 2008.  Marland Kitchen History of acute cystitis 08/2009   with acute prostatitis (enterococcus faecalis)  . History of solitary pulmonary nodule    Initially noted on CT abd for his adrenal hemorrhage s/p fall from ladder 2001.  F/u CT chest 04/2000 showed that it had shrunk from 8mm to 44mm and had no malignant features.  . History of TIA (transient ischemic attack) @ 2008   02/2001 and 01/2006 carotid dopplers showed 50% or less stenosis.  . Left bundle branch hemiblock    mentioned in dx of old records 04/15/2009  . Migraine headache with aura onset in teens   No migraines since about 2004  . Skin cancer     Past Surgical History:  Procedure Laterality Date  . Carotid dopplers  05/2016   1-39% stenosis bilat ICA.  . cataract extrac    . dental     implants  . PROSTATE BIOPSY  04/07/2005   For elevated PSA + right sided prostate nodule on DRE by urologist:  benign biopsy.  . TONSILLECTOMY  preteen  . TRANSTHORACIC ECHOCARDIOGRAM  06/28/2016   EF 60-65%, mild dilatation of ascending aorta.    Outpatient Medications Prior to Visit  Medication Sig Dispense Refill  . aspirin 325 MG EC tablet Take 1 tablet (  325 mg total) by mouth daily. 90 tablet 3  . BIOTIN PO Take 1 capsule by mouth daily.    Marland Kitchen CALCIUM PO Take by mouth daily.     . CHOLECALCIFEROL PO Take by mouth.    . CYANOCOBALAMIN PO Take by mouth.    Marland Kitchen glucosamine-chondroitin 500-400 MG tablet Take 1 tablet by mouth daily.    Marland Kitchen MAGNESIUM PO Take by mouth daily.    . mupirocin ointment (BACTROBAN) 2 % Apply to affected area tid x 10d 15 g 0  . Omega-3 Fatty Acids (OMEGA 3 PO) Take 1 capsule by mouth.     . rivastigmine (EXELON) 4.6 mg/24hr     . tamsulosin (FLOMAX) 0.4 MG CAPS capsule Take 2 capsules (0.8 mg total) by mouth daily after supper. 180 capsule 3  . traZODone (DESYREL) 50 MG tablet 1 tab po qhs prn insomnia 30 tablet 1  . VITAMIN E PO Take 1 capsule by mouth daily.      No  facility-administered medications prior to visit.     No Known Allergies  ROS As per HPI  PE: Blood pressure 121/76, pulse 69, temperature 98.9 F (37.2 C), temperature source Oral, resp. rate 16, height 5\' 7"  (1.702 m), weight 145 lb 12 oz (66.1 kg), SpO2 96 %.  Patient only marginally cooperative with exam today. Gen: Alert, well appearing.  Patient is oriented to person and place only, is loud and somewhat obnoxious as per his usual demeanor in our office. ENT: Ears: EACs clear, normal epithelium.  TMs with good light reflex and landmarks bilaterally.  Eyes: no injection, icteris, swelling, or exudate.  EOMI, PERRLA. Nose: no drainage or turbinate edema/swelling.  No injection or focal lesion.  Mouth: lips without lesion/swelling.  Oral mucosa pink and moist.   Oropharynx without erythema, exudate, or swelling.  Neck - No masses or thyromegaly or limitation in range of motion CV: RRR, no m/r/g.   LUNGS: CTA bilat, nonlabored resps, good aeration in all lung fields. ABD: soft, NT/ND EXT: no clubbing, cyanosis, or edema.    LABS:    Chemistry      Component Value Date/Time   NA 138 06/27/2016 0646   K 4.5 06/27/2016 0646   CL 103 06/27/2016 0646   CO2 25 06/27/2016 0646   BUN 16 06/27/2016 0646   CREATININE 0.98 06/27/2016 0646   CREATININE 1.02 09/05/2015 1528      Component Value Date/Time   CALCIUM 9.2 06/27/2016 0646   ALKPHOS 84 10/11/2014 1232   AST 23 10/11/2014 1232   ALT 16 10/11/2014 1232   BILITOT 0.7 10/11/2014 1232       CC UA today: + nitrites, LEU, and blood  IMPRESSION AND PLAN:  Fever, unknown etiology. No hints on history or physical. Looks/sounds good on exam today. UA: abnormalities c/w UTI.  Sent for c/s. Start cefdinir 300 mg bid x 7d. Instructions: If fever returns, give 1000 mg generic tylenol (acetaminophen) every 6 hours as needed. Have him drink small sips of fluid frequently (water, ginger ail or sprite are all fine). If his fever is  still present on Wednesday, 01/19/17, make appt for return visit with me. Otherwise f/u 1 week.  An After Visit Summary was printed and given to the patient.  FOLLOW UP: Return for make f/u appt if pt still having fever on 01/19/17.  Signed:  Crissie Sickles, MD           01/17/2017

## 2017-01-17 NOTE — Telephone Encounter (Signed)
Noted  

## 2017-01-17 NOTE — Patient Instructions (Signed)
If fever returns, give 1000 mg generic tylenol (acetaminophen) every 6 hours as needed. Have him drink small sips of fluid frequently (water, ginger ail or sprite are all fine). If his fever is still present on Wednesday, 01/19/17, make appt for return visit with me.

## 2017-01-17 NOTE — Telephone Encounter (Signed)
LMOM for [name], pt's wife. Signif infection signs on UA. Called in cefdinir 300 mg bid x 7d. Asked her to bring pt back if fevers still present 01/19/17. Otherwise, f/u in office 1 week.

## 2017-01-17 NOTE — Telephone Encounter (Signed)
Pt has apt today at 11:30am with Dr. Anitra Lauth.

## 2017-01-17 NOTE — Telephone Encounter (Signed)
PLEASE NOTE: All timestamps contained within this report are represented as [place] Standard Time. CONFIDENTIALTY NOTICE: This fax transmission is intended only for the addressee. It contains information that is legally privileged, confidential or otherwise protected from use or disclosure. If you are not the intended recipient, you are strictly prohibited from reviewing, disclosing, copying using or disseminating any of this information or taking any action in reliance on or regarding this information. If you have received this fax in error, please notify us immediately by telephone so that we can arrange for its return to [place]. Phone: [phone], Toll-Free: [phone], Fax: [phone] Page: 1 of 2 Call Id: [id number] [place] Patient Name: Mitchell Abbott Gender: Male DOB: 1924-04-09 Age: 83 Y 54 M 20 D Return Phone Number: [phone] (Primary) City/State/Zip: [place] [address] Client [facility] - Client Client Site [facility] Physician Crissie Sickles - MD Who Is Calling Patient / Member / Family / Caregiver Call Type Triage / Clinical Caller Name tomoko Olaes Relationship To Patient Spouse Return Phone Number [phone] (Primary) Chief Complaint Fever (non urgent symptom) (> THREE MONTHS) Reason for Call Symptomatic / Request for [place] states that her husband has a fever of 101.2, no appetite Nurse Assessment Nurse: Pearlie Oyster, RN, Estill Bamberg Date/Time (Eastern Time): 01/16/2017 11:00:04 PM Confirm and document reason for call. If symptomatic, describe symptoms. ---Caller states that her husband has a fever of 101.4, no appetite. Gave ASA - 325 mg at 8:30 pm. Takes blood thinners. Temp is not going down. Gave ASA again. Temp is 100.6. Has been putting a cold pad on his head. Previously, had some  dizziness. Does the PT have any chronic conditions? (i.e. diabetes, asthma, etc.) ---Yes List chronic conditions. ---Hx of TIA, Enlarged Prostate, 20-60 % blockage to artery in brain. Guidelines Guideline Title Affirmed Question Fever [1] Fever AND [2] no signs of serious infection or localizing symptoms (all other triage questions negative) Disp. Time Eilene Ghazi Time) Disposition Final User 01/16/2017 11:16:31 PM See Physician within 24 Hours Yes Pearlie Oyster, RN, Estill Bamberg Referrals REFERRED TO PCP OFFICE Care Advice Given Per Guideline SEE PHYSICIAN WITHIN 24 HOURS: * IF OFFICE WILL BE OPEN: You need to be seen within the next 24 hours. Call your doctor when the office opens, and make an appointment. FEVER MEDICINES: * For fever relief, take acetaminophen or ibuprofen. * Treat fevers above 101 F (38.3 C). CALL BACK IF: * You become worse. * Dress in 1 layer of lightweight clothing and sleep with 1 light blanket. * For fevers less than 101 F (38.3 C), fever medicines are usually not necessary. * Drink cold fluids to prevent dehydration. FOR ALL FEVERS: ACETAMINOPHEN (E.G., TYLENOL): PLEASE NOTE: All timestamps contained within this report are represented as [place] Standard Time. CONFIDENTIALTY NOTICE: This fax transmission is intended only for the addressee. It contains information that is legally privileged, confidential or otherwise protected from use or disclosure. If you are not the intended recipient, you are strictly prohibited from reviewing, disclosing, copying using or disseminating any of this information or taking any action in reliance on or regarding this information. If you have received this fax in error, please notify us immediately by telephone so that we can arrange for its return to [place]. Phone: [phone], Toll-Free: [phone], Fax: [phone] Page: 2 of 2 Call Id: [id number]

## 2017-01-19 LAB — URINE CULTURE

## 2017-01-20 DIAGNOSIS — C44729 Squamous cell carcinoma of skin of left lower limb, including hip: Secondary | ICD-10-CM | POA: Diagnosis not present

## 2017-02-08 DIAGNOSIS — H04123 Dry eye syndrome of bilateral lacrimal glands: Secondary | ICD-10-CM | POA: Diagnosis not present

## 2017-02-08 DIAGNOSIS — Z961 Presence of intraocular lens: Secondary | ICD-10-CM | POA: Diagnosis not present

## 2017-02-08 DIAGNOSIS — H524 Presbyopia: Secondary | ICD-10-CM | POA: Diagnosis not present

## 2017-02-11 ENCOUNTER — Other Ambulatory Visit: Payer: Self-pay | Admitting: Family Medicine

## 2017-02-14 NOTE — Telephone Encounter (Signed)
[address]  RF request for trazodone LOV: 11/11/16 Next ov: 05/19/17 Last written: 01/03/17 #30 w/ 1RF  Please advise. Thanks.

## 2017-02-22 ENCOUNTER — Ambulatory Visit (INDEPENDENT_AMBULATORY_CARE_PROVIDER_SITE_OTHER): Payer: Medicare Other

## 2017-02-22 DIAGNOSIS — Z23 Encounter for immunization: Secondary | ICD-10-CM

## 2017-05-19 ENCOUNTER — Encounter: Payer: Self-pay | Admitting: Family Medicine

## 2017-05-19 ENCOUNTER — Ambulatory Visit (INDEPENDENT_AMBULATORY_CARE_PROVIDER_SITE_OTHER): Payer: Medicare Other | Admitting: Family Medicine

## 2017-05-19 VITALS — BP 102/63 | HR 66 | Temp 97.9°F | Resp 16 | Ht 67.0 in | Wt 152.5 lb

## 2017-05-19 DIAGNOSIS — G301 Alzheimer's disease with late onset: Secondary | ICD-10-CM | POA: Diagnosis not present

## 2017-05-19 DIAGNOSIS — N401 Enlarged prostate with lower urinary tract symptoms: Secondary | ICD-10-CM | POA: Diagnosis not present

## 2017-05-19 DIAGNOSIS — N3941 Urge incontinence: Secondary | ICD-10-CM

## 2017-05-19 DIAGNOSIS — N138 Other obstructive and reflux uropathy: Secondary | ICD-10-CM | POA: Diagnosis not present

## 2017-05-19 DIAGNOSIS — F02818 Dementia in other diseases classified elsewhere, unspecified severity, with other behavioral disturbance: Secondary | ICD-10-CM

## 2017-05-19 DIAGNOSIS — F0281 Dementia in other diseases classified elsewhere with behavioral disturbance: Secondary | ICD-10-CM | POA: Diagnosis not present

## 2017-05-19 NOTE — Progress Notes (Signed)
OFFICE VISIT  05/19/2017   CC:  Chief Complaint  Patient presents with  . Follow-up    RCI, pt is not fasting.    HPI:    Patient is a 83 y.o. Caucasian male who presents for f/u BPH with LUTS and alzheimer's dementia with behavioral disturbance.  Dementia w/behav dist:  Still lots of memory and behavior problems, talks gibberish much of the time, sometimes yells w/out reason, gets angry w/out reason as well.   He is eating fine, also drinking protein drink daily.   Risperdal 0.5mg . 2 qhs added since last f/u with me 6 mo ago--by neurologist--for agitation and insomnia.  Med made him too drowsy and impaired so wife has decreased this down to 1/2 of 0.5mg  qd. Wife has decided to give this on prn basis.  He did get a dose of this med last night.  BPH: has had orthostatic dizziness with alpha blockers, but seems to tolerate flomax the best. He is basically incontinent. He has been to an MD at the [place], was given adult diapers.  PVR avg at that visit was 45 ml.  Past Medical History:  Diagnosis Date  . Actinic keratoses   . Adrenal hemorrhage ([place]) 2001   s/p fall from ladder--a f/u CT abd showed resolution of the hemorrhage- (duodenal polyp incidentally noted;  f/u EGD by Dr. Watt Climes 03/2000 showed small lipomatous mass in duodenum which was confirmed by biopsy.  . Alzheimer's dementia    with behav disturbance and apathy.  [facility] referral as of 07/2013.  They added namenda 07/2014 due to pt's signif decline in the prior 6 mo.  . Arthralgia of knee, right 2009   exam suspicious for meniscal origin  . BPH (benign prostatic hypertrophy)    History of acute urinary retention (2006)  Hx of elevated PSA (7.26) + right sided prostate nodule on DRE by urologist.  PSA did not go down with tx with antibiotics--biopsy was done and showed NO MALIGNANCY but some chronic inflammation was present--turned out related to acute cystitis (enterococcus UTI).  PSA was 1.17 in 07/2011 at urologist's  (Dr. Karsten Ro).  F/u at urol is now prn as of 08/2014.  Marland Kitchen BPPV (benign paroxysmal positional vertigo)   . DDD (degenerative disc disease), cervical    C5-6, C6-7 primarily  . Headache in back of head    Neurologist treated him with PT for this problem and it helped  . Hearing deficit    Bilateral; noise damage in [employer].  Has had hearing aids since about 2008.  Marland Kitchen History of acute cystitis 08/2009   with acute prostatitis (enterococcus faecalis)  . History of solitary pulmonary nodule    Initially noted on CT abd for his adrenal hemorrhage s/p fall from ladder 2001.  F/u CT chest 04/2000 showed that it had shrunk from 47mm to 73mm and had no malignant features.  . History of TIA (transient ischemic attack) @ 2008   02/2001 and 01/2006 carotid dopplers showed 50% or less stenosis.  . Left bundle branch hemiblock    mentioned in dx of old records 04/15/2009  . Migraine headache with aura onset in teens   No migraines since about 2004  . Skin cancer     Past Surgical History:  Procedure Laterality Date  . Carotid dopplers  05/2016   1-39% stenosis bilat ICA.  . cataract extrac    . dental     implants  . PROSTATE BIOPSY  04/07/2005   For elevated PSA +  right sided prostate nodule on DRE by urologist:  benign biopsy.  . TONSILLECTOMY  preteen  . TRANSTHORACIC ECHOCARDIOGRAM  06/28/2016   EF 60-65%, mild dilatation of ascending aorta.    Outpatient Medications Prior to Visit  Medication Sig Dispense Refill  . aspirin 325 MG EC tablet Take 1 tablet (325 mg total) by mouth daily. 90 tablet 3  . BIOTIN PO Take 1 capsule by mouth daily.    Marland Kitchen CALCIUM PO Take by mouth daily.     . CHOLECALCIFEROL PO Take by mouth.    . CYANOCOBALAMIN PO Take by mouth.    Marland Kitchen glucosamine-chondroitin 500-400 MG tablet Take 1 tablet by mouth daily.    Marland Kitchen MAGNESIUM PO Take by mouth daily.    . Omega-3 Fatty Acids (OMEGA 3 PO) Take 1 capsule by mouth.     . risperiDONE (RISPERDAL) 0.5 MG tablet Take 0.5 mg by  mouth 2 (two) times daily.    . rivastigmine (EXELON) 4.6 mg/24hr 12.5 mg.     . tamsulosin (FLOMAX) 0.4 MG CAPS capsule Take 2 capsules (0.8 mg total) by mouth daily after supper. 180 capsule 3  . VITAMIN E PO Take 1 capsule by mouth daily.     . cefdinir (OMNICEF) 300 MG capsule Take 1 capsule (300 mg total) by mouth 2 (two) times daily. (Patient not taking: Reported on 05/19/2017) 14 capsule 0  . mupirocin ointment (BACTROBAN) 2 % Apply to affected area tid x 10d (Patient not taking: Reported on 05/19/2017) 15 g 0  . traZODone (DESYREL) 50 MG tablet TAKE 1 TABLET BY MOUTH EVERY NIGHT AT BEDTIME AS NEEDED INSOMNIA (Patient not taking: Reported on 05/19/2017) 90 tablet 1   No facility-administered medications prior to visit.     No Known Allergies  ROS As per HPI  PE: Blood pressure 102/63, pulse 66, temperature 97.9 F (36.6 C), temperature source Oral, resp. rate 16, height 5\' 7"  (1.702 m), weight 152 lb 8 oz (69.2 kg), SpO2 96 %. Gen: Alert, well appearing.  Patient is oriented to person, place, time, and situation. Affect: flat, responds to me only by looking at me, sometimes just making an odd sound and head movement--essentially child-like behavior.  Somewhat sleepy today. CV: RRR, no m/r/g.   LUNGS: CTA bilat, nonlabored resps, good aeration in all lung fields. EXT: no clubbing, cyanosis, or edema.    LABS:    Chemistry      Component Value Date/Time   NA 138 06/27/2016 0646   K 4.5 06/27/2016 0646   CL 103 06/27/2016 0646   CO2 25 06/27/2016 0646   BUN 16 06/27/2016 0646   CREATININE 0.98 06/27/2016 0646   CREATININE 1.02 09/05/2015 1528      Component Value Date/Time   CALCIUM 9.2 06/27/2016 0646   ALKPHOS 84 10/11/2014 1232   AST 23 10/11/2014 1232   ALT 16 10/11/2014 1232   BILITOT 0.7 10/11/2014 1232       IMPRESSION AND PLAN:  1) Alzheimer's dementia with behavioral disturbance: continues to decline. Continue exelon and risperdal as per  neurologist. Continue adult daycare. Full care at home by Wife [facility]) is excellent.  2) BPH with LUTS: also some component of OAB suspected. He has adverse rxn to meds, but tolerates flomax 0.8mg  qhs and recently showed good bladder emptying when tested at Urologist's office at the [place].  Continue adult diapers.  Spent 25 min with pt today, with >50% of this time spent in counseling and care coordination regarding the above  problems.  An After Visit Summary was printed and given to the patient.  FOLLOW UP: Return in about 6 months (around 11/17/2017) for routine chronic illness f/u (28min).  Signed:  Crissie Sickles, MD           05/19/2017

## 2017-06-16 DIAGNOSIS — C44529 Squamous cell carcinoma of skin of other part of trunk: Secondary | ICD-10-CM | POA: Diagnosis not present

## 2017-07-29 ENCOUNTER — Telehealth: Payer: Self-pay | Admitting: Family Medicine

## 2017-07-29 NOTE — Telephone Encounter (Signed)
Patient's wife([name] Boston) dropped off form to office for pcp to complete. The form is from [facility] to request a regular aid for patient.    Wife requesting to have the following info relayed to pcp which may help with completing form:  Patient is unable to bath or dress himself.  On a good days he does try to feed himself but due to his short attention span he never feeds himself until meal is complete.    Patient is able to walk short distances but requires a wheelchair for long distances.  He uses a walker and chair lift at home.  Per wife patient suffers from incontinence.   Please contact patient's wife with any concerns/questions and notify her when form has been completed.

## 2017-08-02 NOTE — Telephone Encounter (Signed)
Message left on voice mail for patient's wife to return call. Patient will need 30 minute appointment to have form completed.

## 2017-08-05 ENCOUNTER — Encounter: Payer: Self-pay | Admitting: Family Medicine

## 2017-08-05 ENCOUNTER — Ambulatory Visit (INDEPENDENT_AMBULATORY_CARE_PROVIDER_SITE_OTHER): Payer: Medicare Other | Admitting: Family Medicine

## 2017-08-05 VITALS — BP 135/84 | HR 65 | Temp 97.3°F | Resp 16 | Ht 67.0 in | Wt 149.0 lb

## 2017-08-05 DIAGNOSIS — R5381 Other malaise: Secondary | ICD-10-CM | POA: Diagnosis not present

## 2017-08-05 DIAGNOSIS — R269 Unspecified abnormalities of gait and mobility: Secondary | ICD-10-CM

## 2017-08-05 DIAGNOSIS — F0391 Unspecified dementia with behavioral disturbance: Secondary | ICD-10-CM | POA: Diagnosis not present

## 2017-08-05 NOTE — Progress Notes (Signed)
OFFICE VISIT  08/08/2017   CC:  Chief Complaint  Patient presents with  . Follow-up    Form completed     HPI:    Patient is a 84 y.o. Caucasian male who presents for accompanied by his wife [name] today to be evaluated for completion of paperwork for "housebound status or permanent need for regular aid and attendance"--with [employer] of [employer]. Patient has severe dementia with behavioral disturbance, has wandering tendencies, has to have nearly full assistance with all ADL's.  He goes to an adult daycare center for 4 hours every day on weekdays, stays at home with wife otherwise.  Wife is sole caregiver.  He is incontinent of urine and stool.  He walks slowly and has some intermittent c/o neck pain but has no significant impairment from musculoskeletal problems and has not had any focal motor or sensory neurologic deficits.    Past Medical History:  Diagnosis Date  . Actinic keratoses   . Adrenal hemorrhage ([employer]) 2001   s/p fall from ladder--a f/u CT abd showed resolution of the hemorrhage- (duodenal polyp incidentally noted;  f/u EGD by Dr. Watt Climes 03/2000 showed small lipomatous mass in duodenum which was confirmed by biopsy.  . Alzheimer's dementia    with behav disturbance and apathy.  [facility] referral as of 07/2013.  They added namenda 07/2014 due to pt's signif decline in the prior 6 mo.  . Arthralgia of knee, right 2009   exam suspicious for meniscal origin  . BPH (benign prostatic hypertrophy)    History of acute urinary retention (2006)  Hx of elevated PSA (7.26) + right sided prostate nodule on DRE by urologist.  PSA did not go down with tx with antibiotics--biopsy was done and showed NO MALIGNANCY but some chronic inflammation was present--turned out related to acute cystitis (enterococcus UTI).  PSA was 1.17 in 07/2011 at urologist's (Dr. Karsten Ro).  F/u at urol is now prn as of 08/2014.  Marland Kitchen BPPV (benign paroxysmal positional vertigo)   . DDD (degenerative disc  disease), cervical    C5-6, C6-7 primarily  . Headache in back of head    Neurologist treated him with PT for this problem and it helped  . Hearing deficit    Bilateral; noise damage in [employer].  Has had hearing aids since about 2008.  Marland Kitchen History of acute cystitis 08/2009   with acute prostatitis (enterococcus faecalis)  . History of solitary pulmonary nodule    Initially noted on CT abd for his adrenal hemorrhage s/p fall from ladder 2001.  F/u CT chest 04/2000 showed that it had shrunk from 5mm to 54mm and had no malignant features.  . History of TIA (transient ischemic attack) @ 2008   02/2001 and 01/2006 carotid dopplers showed 50% or less stenosis.  . Left bundle branch hemiblock    mentioned in dx of old records 04/15/2009  . Migraine headache with aura onset in teens   No migraines since about 2004  . Skin cancer     Past Surgical History:  Procedure Laterality Date  . Carotid dopplers  05/2016   1-39% stenosis bilat ICA.  . cataract extrac    . dental     implants  . PROSTATE BIOPSY  04/07/2005   For elevated PSA + right sided prostate nodule on DRE by urologist:  benign biopsy.  . TONSILLECTOMY  preteen  . TRANSTHORACIC ECHOCARDIOGRAM  06/28/2016   EF 60-65%, mild dilatation of ascending aorta.    Outpatient Medications Prior to  Visit  Medication Sig Dispense Refill  . aspirin 325 MG EC tablet Take 1 tablet (325 mg total) by mouth daily. 90 tablet 3  . BIOTIN PO Take 1 capsule by mouth daily.    Marland Kitchen CALCIUM PO Take by mouth daily.     . CHOLECALCIFEROL PO Take by mouth.    . CYANOCOBALAMIN PO Take by mouth.    Marland Kitchen glucosamine-chondroitin 500-400 MG tablet Take 1 tablet by mouth daily.    Marland Kitchen MAGNESIUM PO Take by mouth daily.    . Omega-3 Fatty Acids (OMEGA 3 PO) Take 1 capsule by mouth.     . risperiDONE (RISPERDAL) 0.5 MG tablet Take 0.5 mg by mouth 2 (two) times daily. 1/4 tablet in am and 1/2 tablet pm    . rivastigmine (EXELON) 4.6 mg/24hr 12.5 mg.     . tamsulosin  (FLOMAX) 0.4 MG CAPS capsule Take 2 capsules (0.8 mg total) by mouth daily after supper. 180 capsule 3  . VITAMIN E PO Take 1 capsule by mouth daily.      No facility-administered medications prior to visit.     No Known Allergies  ROS As per HPI  PE: Blood pressure 135/84, pulse 65, temperature (!) 97.3 F (36.3 C), temperature source Oral, resp. rate 16, height 5\' 7"  (1.702 m), weight 149 lb (67.6 kg), SpO2 97 %. Gen: Alert, well appearing.  Patient is oriented to person, place, time, and situation. AFFECT: pleasant, lucid thought and speech. CV: RRR, no m/r/g.   LUNGS: CTA bilat, nonlabored resps, good aeration in all lung fields. EXT: no clubbing, cyanosis, or edema.  Musculoskeletal: no joint swelling, erythema, warmth, or tenderness.  ROM of all joints intact. Neuro: CN 2-12 intact bilaterally, strength 5/5 in proximal and distal upper extremities and lower extremities bilaterally. No tremor.  No ataxia but he walks slowly and needs support of a person or walker to stay steady.    LABS:  none  IMPRESSION AND PLAN:  Evaluation for paperwork for housebound status /permanent need for regular aid and attendance. Pt definitely has sufficient chronic dementia with behavioral disturbance and debilitation to get these services. [facility].  Spent 25 min with pt and his wife today, with >50% of this time spent in counseling and care coordination regarding the above problems.  An After Visit Summary was printed and given to the patient.  FOLLOW UP: Return for as needed.  Signed:  Crissie Sickles, MD           08/08/2017

## 2017-09-23 ENCOUNTER — Other Ambulatory Visit: Payer: Self-pay

## 2017-09-23 ENCOUNTER — Encounter (HOSPITAL_BASED_OUTPATIENT_CLINIC_OR_DEPARTMENT_OTHER): Payer: Self-pay | Admitting: Emergency Medicine

## 2017-09-23 ENCOUNTER — Emergency Department (HOSPITAL_BASED_OUTPATIENT_CLINIC_OR_DEPARTMENT_OTHER)
Admission: EM | Admit: 2017-09-23 | Discharge: 2017-09-23 | Disposition: A | Discharge status: Home / Self Care | Payer: Medicare Other | Attending: Emergency Medicine | Admitting: Emergency Medicine

## 2017-09-23 DIAGNOSIS — F0281 Dementia in other diseases classified elsewhere with behavioral disturbance: Secondary | ICD-10-CM | POA: Diagnosis not present

## 2017-09-23 DIAGNOSIS — G301 Alzheimer's disease with late onset: Secondary | ICD-10-CM | POA: Insufficient documentation

## 2017-09-23 DIAGNOSIS — Z87891 Personal history of nicotine dependence: Secondary | ICD-10-CM | POA: Insufficient documentation

## 2017-09-23 DIAGNOSIS — Z7982 Long term (current) use of aspirin: Secondary | ICD-10-CM | POA: Insufficient documentation

## 2017-09-23 DIAGNOSIS — Z8673 Personal history of transient ischemic attack (TIA), and cerebral infarction without residual deficits: Secondary | ICD-10-CM | POA: Insufficient documentation

## 2017-09-23 DIAGNOSIS — R402441 Other coma, without documented Glasgow coma scale score, or with partial score reported, in the field [EMT or ambulance]: Secondary | ICD-10-CM | POA: Diagnosis not present

## 2017-09-23 DIAGNOSIS — Z79899 Other long term (current) drug therapy: Secondary | ICD-10-CM | POA: Insufficient documentation

## 2017-09-23 DIAGNOSIS — R4182 Altered mental status, unspecified: Secondary | ICD-10-CM | POA: Diagnosis present

## 2017-09-23 LAB — CBG MONITORING, ED: Glucose-Capillary: 80 mg/dL (ref 65–99)
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
## 2017-09-23 NOTE — ED Provider Notes (Signed)
[facility] Provider Note: Mitchell Spurling, MD, FACEP  CSN: [id number] MRN: [id number] ARRIVAL: 09/23/17 at 0444 ROOM: [place]  Altered Mental Status  Level 5 caveat: Dementia HISTORY OF PRESENT ILLNESS  09/23/17 4:55 AM   Mitchell Abbott is a 84 y.o. male with severe dementia with behavioral disturbance, wandering tendencies, and has to have nearly full assistance with all ADL's.  He was found driving a convertible with the top down just prior to arrival.  He was driving erratically and was pulled over by police who suspected him of being a drunk driver.  They found him to be closed only in a shirt and diaper and unable to give any information other than his name.  There was no evidence of injury and EMS found his blood sugar to be 91 on scene.    Past Medical History:  Diagnosis Date  . Actinic keratoses   . 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 Watt Climes 03/2000 showed small lipomatous mass in duodenum which was confirmed by biopsy.  . Alzheimer's dementia    with behav disturbance and apathy.  [facility] referral as of 07[facility].  They added namenda 07/2014 due to pt's signif decline in the prior 6 mo.  . Arthralgia of knee, right 2009   exam suspicious for meniscal origin  . BPH (benign prostatic hypertrophy)    History of acute urinary retention (2006)  Hx of elevated PSA (7.26) + right sided prostate nodule on DRE by urologist.  PSA did not go down with tx with antibiotics--biopsy was done and showed NO MALIGNANCY but some chronic inflammation was present--turned out related to acute cystitis (enterococcus UTI).  PSA was 1.17 in 07/2011 at urologist's (Dr. Karsten Ro).  F/u at urol is now prn as of 08/2014.  Marland Kitchen BPPV (benign paroxysmal positional vertigo)   . DDD (degenerative disc disease), cervical    C5-6, C6-7 primarily  . Headache  in back of head    Neurologist treated him with PT for this problem and it helped  . Hearing deficit    Bilateral; noise damage in [employer]  Has had hearing aids since about 2008.  Marland Kitchen History of acute cystitis 08/2009   with acute prostatitis (enterococcus faecalis)  . History of solitary pulmonary nodule    Initially noted on CT abd for his adrenal hemorrhage s/p fall from ladder 2001.  F/u CT chest 04/2000 showed that it had shrunk from 13mm to 24mm and had no malignant features.  . History of TIA (transient ischemic attack) @ 2008   02/2001 and 01/2006 carotid dopplers showed 50% or less stenosis.  . Left bundle branch hemiblock    mentioned in dx of old records 04/15/2009  . Migraine headache with aura onset in teens   No migraines since about 2004  . Skin cancer     Past Surgical History:  Procedure Laterality Date  . Carotid dopplers  05/2016   1-39% stenosis bilat ICA.  . cataract extrac    . dental     implants  . PROSTATE BIOPSY  04/07/2005   For elevated PSA + right sided prostate nodule on DRE by urologist:  benign biopsy.  . TONSILLECTOMY  preteen  . TRANSTHORACIC ECHOCARDIOGRAM  06/28/2016   EF 60-65%, mild dilatation of ascending aorta.    Family History  Problem Relation Age of Onset  .  Sudden death Mother 56       FH info obtained from Dr. Ovid Curd records.  . Breast cancer Daughter   . Migraines Daughter     Social History   Tobacco Use  . Smoking status: Former Smoker    Years: 2.00    Types: Cigarettes  . Smokeless tobacco: Never Used  Substance Use Topics  . Alcohol use: Yes    Alcohol/week: 0.6 oz    Types: 1 Cans of beer per week    Comment: "a good drink every day" BEER/WINE  . Drug use: No    Prior to Admission medications   Medication Sig Start Date End Date Taking? Authorizing Provider  aspirin 325 MG EC tablet Take 1 tablet (325 mg total) by mouth daily. 10/20/12  Yes Marcial Pacas, MD  BIOTIN PO Take 1 capsule by mouth daily.   Yes  [provider]  CALCIUM PO Take by mouth daily.    Yes [provider]  CHOLECALCIFEROL PO Take by mouth.   Yes [provider]  CYANOCOBALAMIN PO Take by mouth.   Yes [provider]  glucosamine-chondroitin 500-400 MG tablet Take 1 tablet by mouth daily.   Yes [provider]  MAGNESIUM PO Take by mouth daily.   Yes [provider]  Omega-3 Fatty Acids (OMEGA 3 PO) Take 1 capsule by mouth.    Yes [provider]  risperiDONE (RISPERDAL) 0.5 MG tablet Take 0.5 mg by mouth 2 (two) times daily. 1/4 tablet in am and 1/2 tablet pm   Yes [provider]  rivastigmine (EXELON) 4.6 mg/24hr 12.5 mg.  07/06/16  Yes [provider]  tamsulosin (FLOMAX) 0.4 MG CAPS capsule Take 2 capsules (0.8 mg total) by mouth daily after supper. 08/11/16  Yes McGowen, Adrian Blackwater, MD  VITAMIN E PO Take 1 capsule by mouth daily.    Yes [provider]    Allergies Patient has no known allergies.   REVIEW OF SYSTEMS    PHYSICAL EXAMINATION  Initial Vital Signs Blood pressure (!) 157/69, pulse 62, temperature 97.6 F (36.4 C), temperature source Oral, resp. rate 16, height 5\' 7"  (1.702 m), weight 66.7 kg (147 lb), SpO2 98 %.  Examination General: Well-developed, well-nourished male in no acute distress; appearance consistent with age of record HENT: normocephalic; atraumatic Eyes: pupils equal, round and reactive to light; bilateral pseudophakia Neck: supple Heart: regular rate and rhythm Lungs: clear to auscultation bilaterally Abdomen: soft; nondistended; nontender; bowel sounds present Extremities: Arthritic changes; pulses normal Neurologic: Awake, alert; unable to answer questions or follow commands; motor function intact in all extremities and symmetric; no facial droop Skin: Warm and dry Psychiatric: Flat affect; yells to minor tactile stimuli   RESULTS  Summary of this visit's results, reviewed by myself:    EKG Interpretation  Date/Time:    Ventricular Rate:    PR Interval:    QRS Duration:   QT Interval:    QTC Calculation:   R Axis:     Text Interpretation:        Laboratory Studies: Results for orders placed or performed during the hospital encounter of 09/23/17 (from the past 24 hour(s))  CBG monitoring, ED     Status: None   Collection Time: 09/23/17  4:53 AM  Result Value Ref Range   Glucose-Capillary 80 65 - 99 mg/dL   Imaging Studies: No results found.  ED COURSE and MDM  Nursing notes and initial vitals signs, including pulse oximetry, reviewed.  Vitals:  09/23/17 0456 09/23/17 0459  BP: (!) 157/69   Pulse: 62   Resp: 16   Temp: 97.6 F (36.4 C)   TempSrc: Oral   SpO2: 98%   Weight:  66.7 kg (147 lb)  Height:  5\' 7"  (1.702 m)   5:08 AM Wife is not present at bedside.  She states the patient is at his baseline mentally.  He inadvertently got a hold of the car key this morning and left the house without her knowledge.  PROCEDURES    ED DIAGNOSES     ICD-10-CM   1. Late onset Alzheimer's disease with behavioral disturbance G30.1    F02.81        Allani Reber, MD 09/23/17 [phone]

## 2017-09-23 NOTE — ED Notes (Signed)
Pt's wife was contacted by PD and has arrived to ED now.

## 2017-10-03 ENCOUNTER — Ambulatory Visit: Payer: Medicare Other

## 2017-10-03 DIAGNOSIS — Z0279 Encounter for issue of other medical certificate: Secondary | ICD-10-CM

## 2017-10-05 ENCOUNTER — Telehealth: Payer: Self-pay | Admitting: Family Medicine

## 2017-10-05 NOTE — Telephone Encounter (Signed)
Faxed FL2 form to [place] at [facility].

## 2017-10-06 IMAGING — CR DG HAND COMPLETE 3+V*L*
3 series · 3 of 3 positions shown · non-contrast
Comparison: None.

CLINICAL DATA: Fall

EXAM:
LEFT HAND - COMPLETE 3+ VIEW

[x hand pa left]
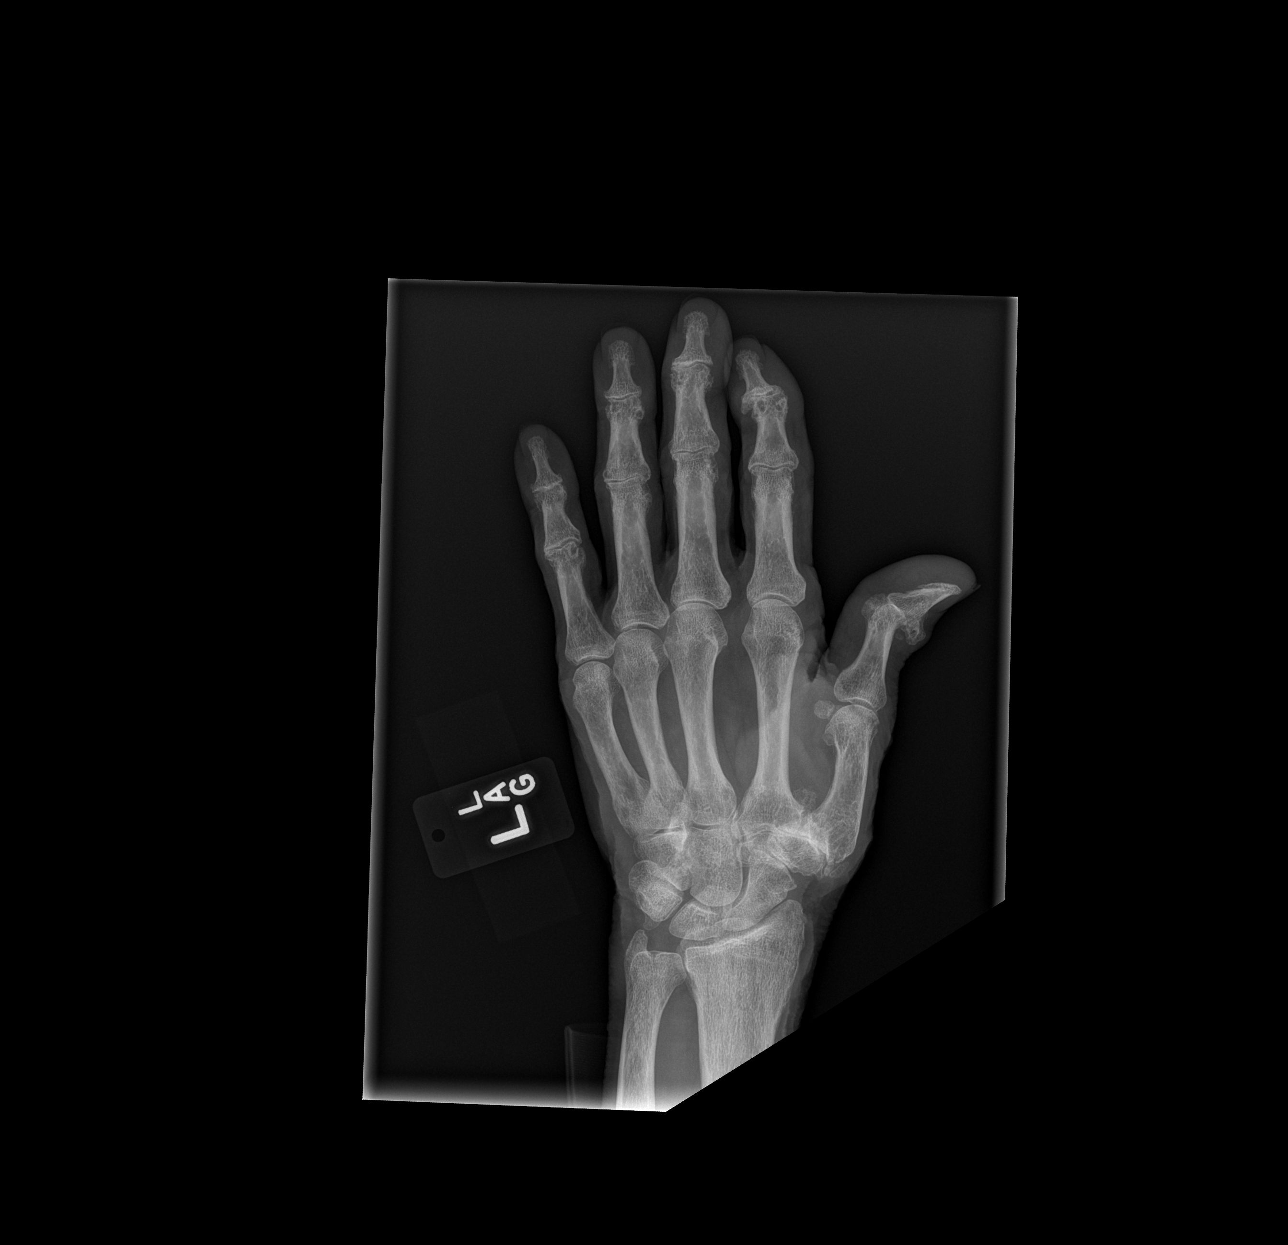

[x hand obl left]
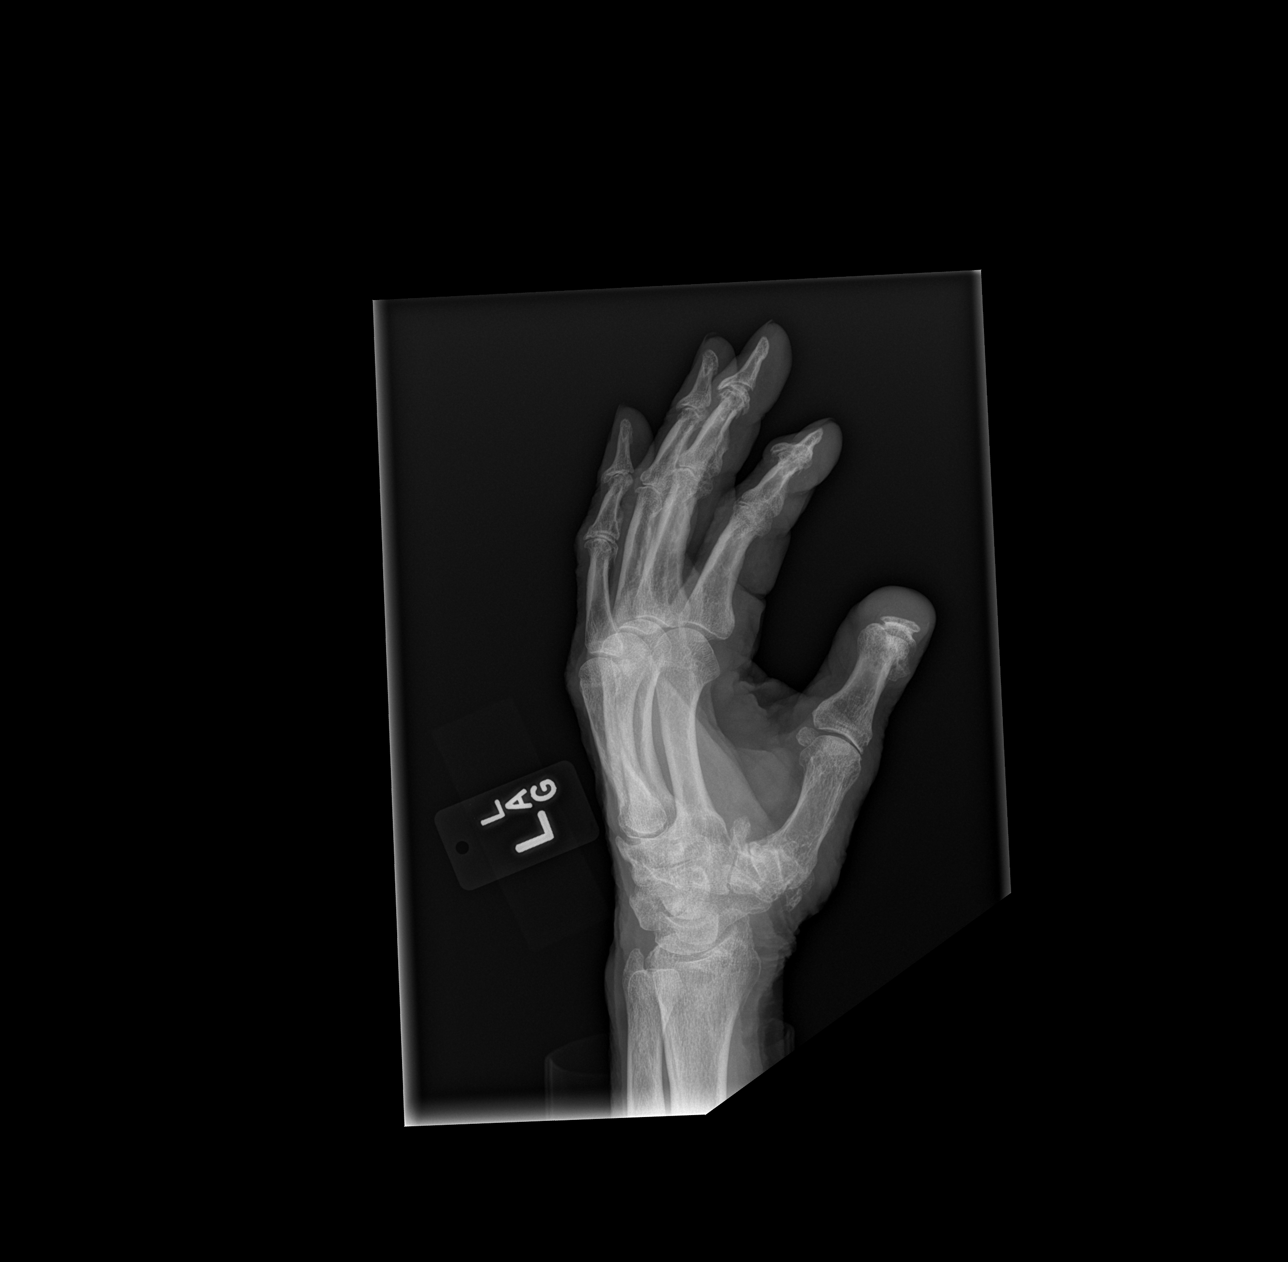

[x hand lat left]
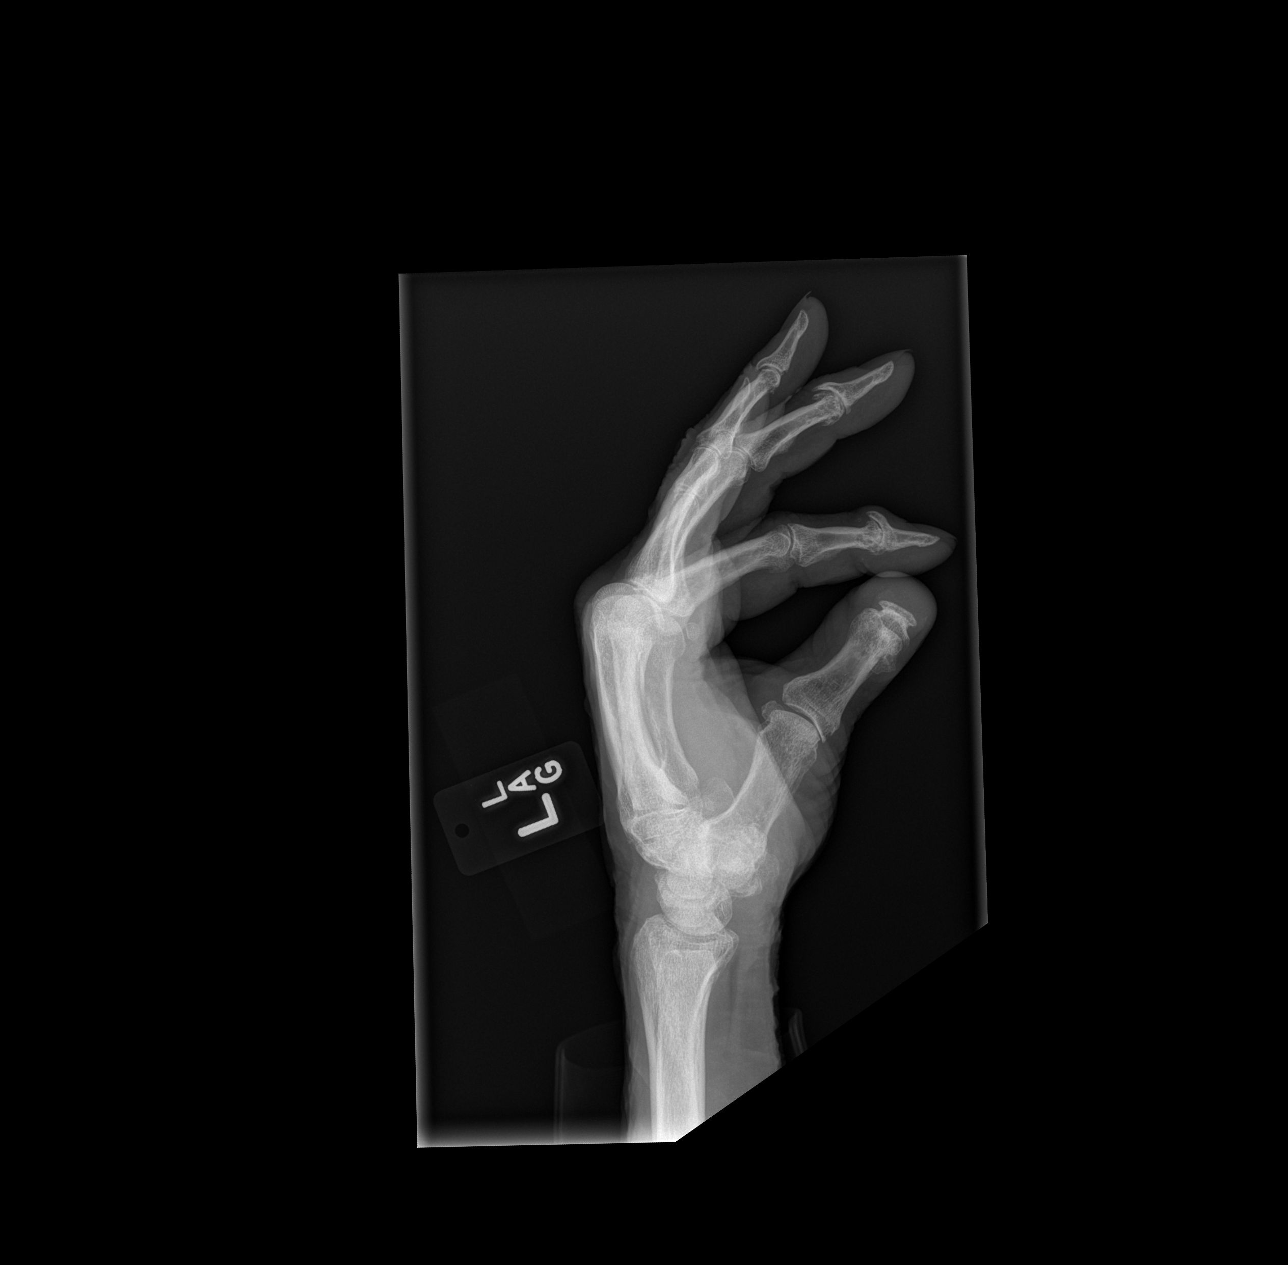

[3 of 3 positions shown; findings below may reference images not displayed]

FINDINGS: Negative for fracture

Osteoarthritis in the hand most notably in the base of the thumb,
first interphalangeal joint and second and third DIP joint. Multiple
interphalangeal joint show no significant degenerative change. No
erosion.
IMPRESSION: Negative for fracture.

## 2017-10-06 IMAGING — CR DG CHEST 2V
2 series · 2 of 2 positions shown · non-contrast
Comparison: None.

CLINICAL DATA: [AGE] male with fall. Flu-like symptoms.
Laceration to the back of the head.

EXAM:
CHEST  2 VIEW

[w chest lat]
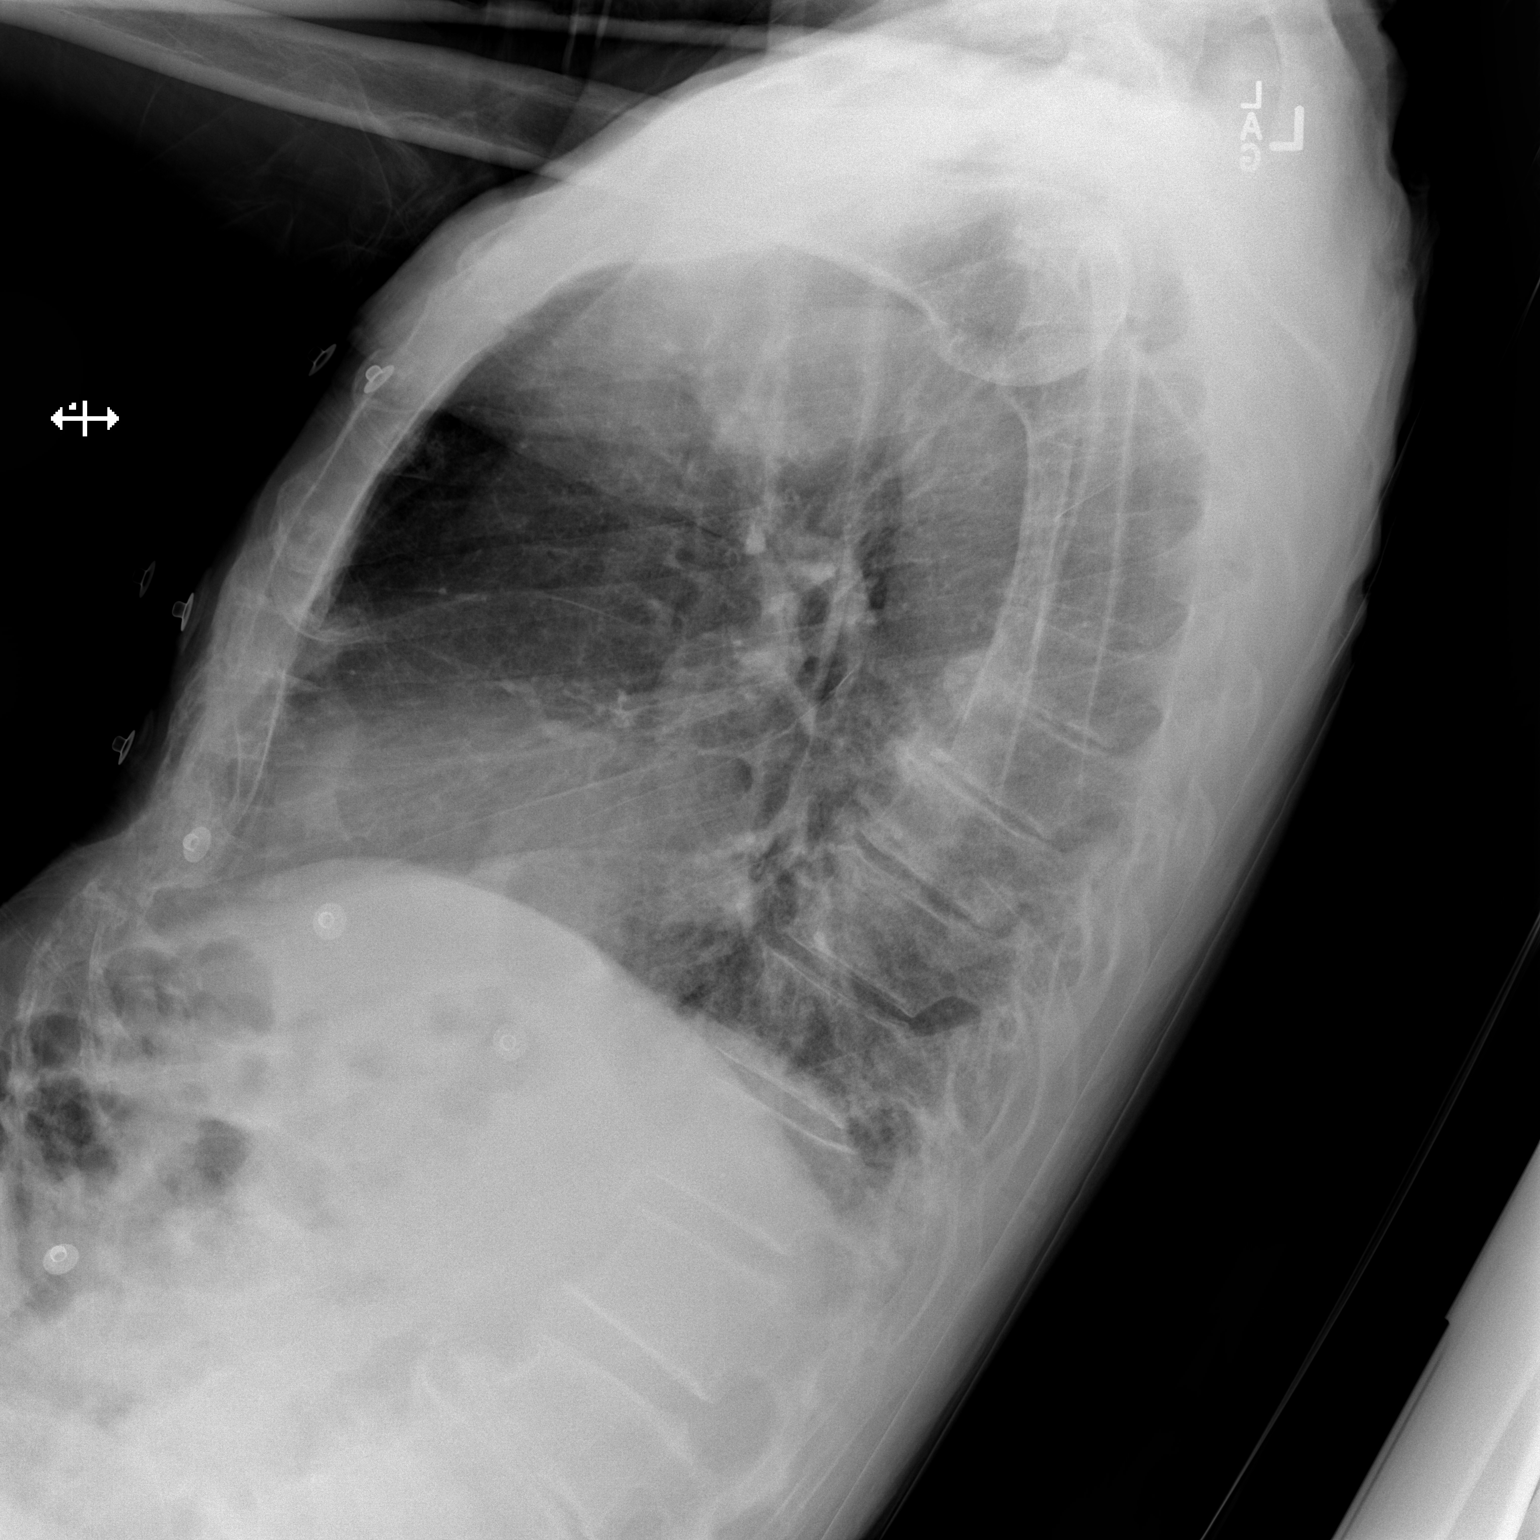

[x chest ap]
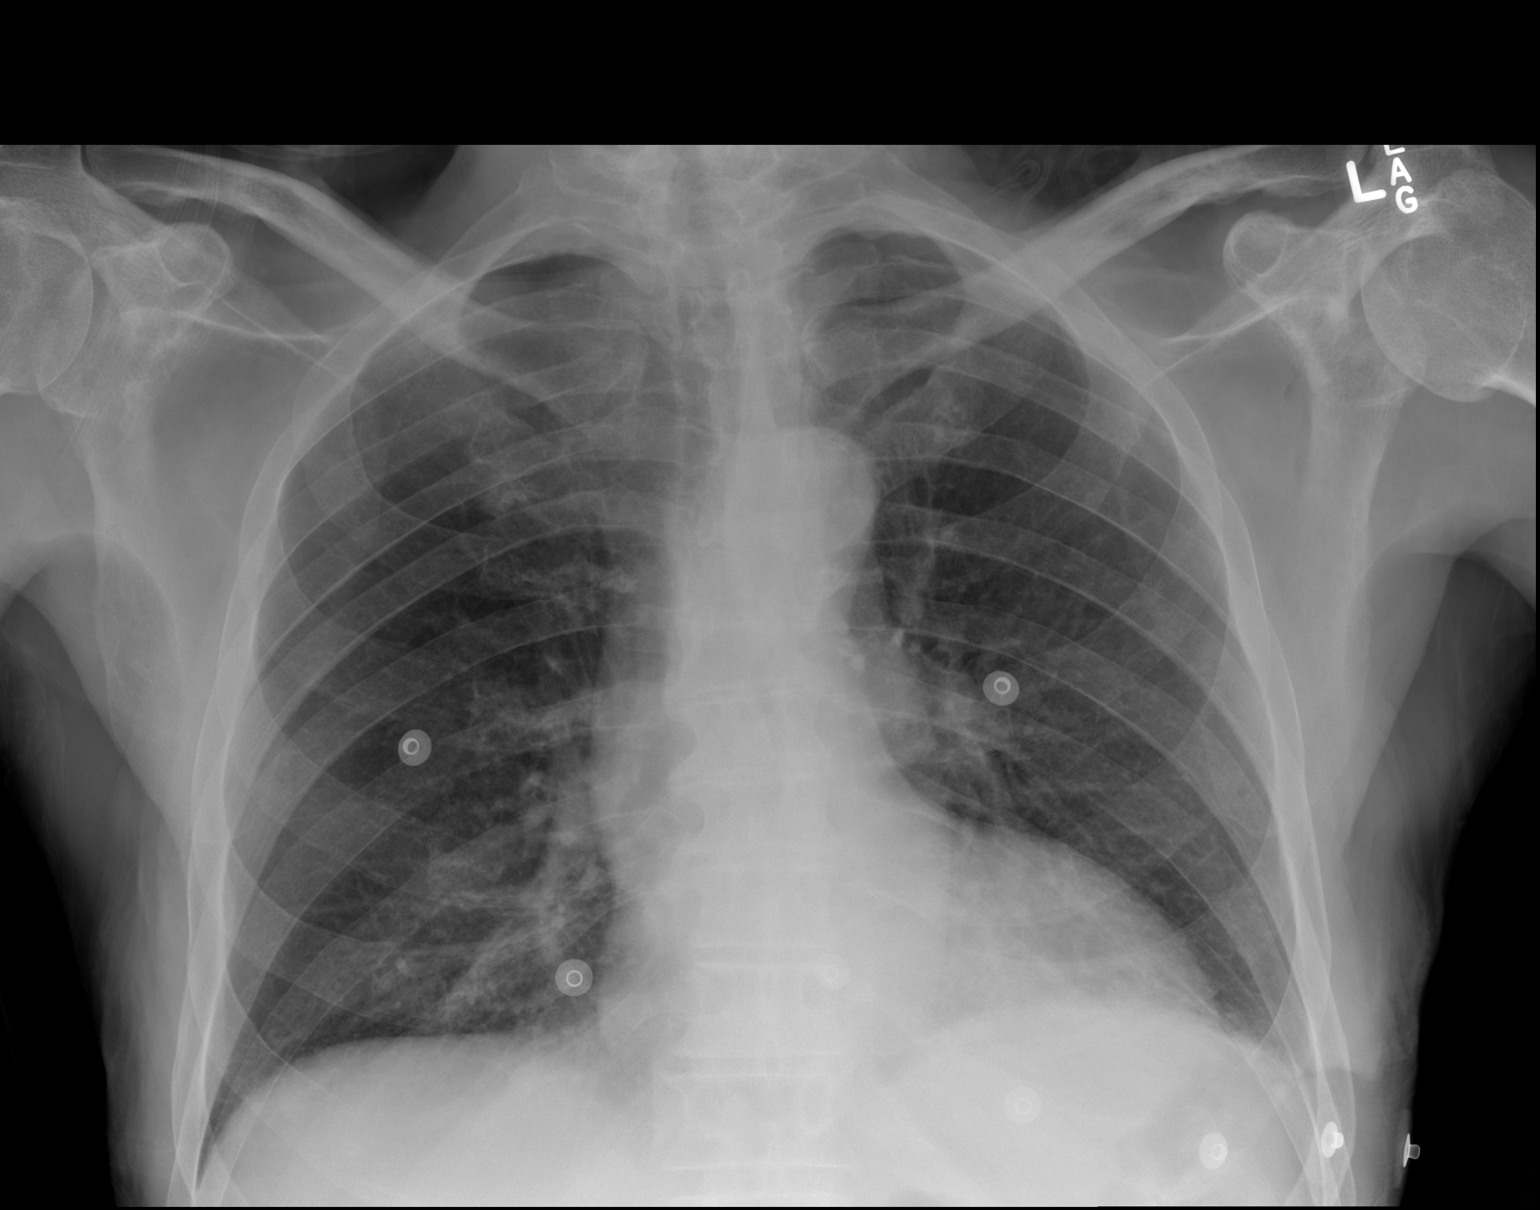

[2 of 2 positions shown; findings below may reference images not displayed]

FINDINGS: Two views of the chest demonstrate bibasilar linear and streaky
densities most compatible with atelectasis/ scarring. There is no
focal consolidation, pleural effusion, or pneumothorax. The cardiac
silhouette is within normal limits. The aorta is mildly tortuous.
There is osteopenia with degenerative changes of the spine. Lower
thoracic compression deformity with anterior wedging appears old. No
definite acute fracture.
IMPRESSION: No active cardiopulmonary disease.

## 2017-11-18 ENCOUNTER — Ambulatory Visit (INDEPENDENT_AMBULATORY_CARE_PROVIDER_SITE_OTHER): Payer: Medicare Other | Admitting: Family Medicine

## 2017-11-18 ENCOUNTER — Encounter: Payer: Self-pay | Admitting: Family Medicine

## 2017-11-18 VITALS — BP 110/72 | HR 63 | Temp 98.4°F | Resp 16 | Ht 67.0 in | Wt 150.5 lb

## 2017-11-18 DIAGNOSIS — N138 Other obstructive and reflux uropathy: Secondary | ICD-10-CM

## 2017-11-18 DIAGNOSIS — R5381 Other malaise: Secondary | ICD-10-CM

## 2017-11-18 DIAGNOSIS — F0391 Unspecified dementia with behavioral disturbance: Secondary | ICD-10-CM | POA: Diagnosis not present

## 2017-11-18 DIAGNOSIS — N401 Enlarged prostate with lower urinary tract symptoms: Secondary | ICD-10-CM

## 2017-11-18 NOTE — Progress Notes (Signed)
OFFICE VISIT  11/18/2017   CC:  Chief Complaint  Mitchell Abbott presents with  . Follow-up    RCI, pt is not fasting.     HPI:    Mitchell Abbott is a 84 y.o. Caucasian male who presents accompanied by his wife for 6 mo f/u severe dementia with behavioral disturbance, basically needs partial or full assistance with activities of daily living, occasionally wanders as well. Is on exelon patch and bid risperdal at low dose. No difference in memory or behavior with increased dose. He still has occ moments of clarity/coherence.  He has to wear adult diapers, but still makes attempts to use toilet for BMs and voiding. Intake of food is fair.  Wt is stable.  Still goes walking regularly with wife, swimming as well and he seems to enjoy this.  No recent illnesses. BP low in mornings, checked by wife b/c pt dizzy.  At this point wife called neurologist and was instructed to pt to stop flomax altogether.  She then had to resume 1 tab of flomax to keep his flow going adequately.  Past Medical History:  Diagnosis Date  . Actinic keratoses   . Adrenal hemorrhage ([place]) 2001   s/p fall from ladder--a f/u CT abd showed resolution of the hemorrhage- (duodenal polyp incidentally noted;  f/u EGD by Dr. Watt Climes 03/2000 showed small lipomatous mass in duodenum which was confirmed by biopsy.  . Alzheimer's dementia    with behav disturbance and apathy.  [facility] referral as of 07/2013.  They added namenda 07/2014 due to pt's signif decline in the prior 6 mo.  . Arthralgia of knee, right 2009   exam suspicious for meniscal origin  . BPH (benign prostatic hypertrophy)    History of acute urinary retention (2006)  Hx of elevated PSA (7.26) + right sided prostate nodule on DRE by urologist.  PSA did not go down with tx with antibiotics--biopsy was done and showed NO MALIGNANCY but some chronic inflammation was present--turned out related to acute cystitis (enterococcus UTI).  PSA was 1.17 in 07/2011 at urologist's (Dr.  Karsten Ro).  F/u at urol is now prn as of 08/2014.  Marland Kitchen BPPV (benign paroxysmal positional vertigo)   . DDD (degenerative disc disease), cervical    C5-6, C6-7 primarily  . Headache in back of head    Neurologist treated him with PT for this problem and it helped  . Hearing deficit    Bilateral; noise damage in [employer].  Has had hearing aids since about 2008.  Marland Kitchen History of acute cystitis 08/2009   with acute prostatitis (enterococcus faecalis)  . History of solitary pulmonary nodule    Initially noted on CT abd for his adrenal hemorrhage s/p fall from ladder 2001.  F/u CT chest 04/2000 showed that it had shrunk from 31mm to 65mm and had no malignant features.  . History of TIA (transient ischemic attack) @ 2008   02/2001 and 01/2006 carotid dopplers showed 50% or less stenosis.  . Left bundle branch hemiblock    mentioned in dx of old records 04/15/2009  . Migraine headache with aura onset in teens   No migraines since about 2004  . Skin cancer     Past Surgical History:  Procedure Laterality Date  . Carotid dopplers  05/2016   1-39% stenosis bilat ICA.  . cataract extrac    . dental     implants  . PROSTATE BIOPSY  04/07/2005   For elevated PSA + right sided prostate nodule on DRE  by urologist:  benign biopsy.  . TONSILLECTOMY  preteen  . TRANSTHORACIC ECHOCARDIOGRAM  06/28/2016   EF 60-65%, mild dilatation of ascending aorta.    Outpatient Medications Prior to Visit  Medication Sig Dispense Refill  . aspirin 325 MG EC tablet Take 1 tablet (325 mg total) by mouth daily. 90 tablet 3  . BIOTIN PO Take 1 capsule by mouth daily.    Marland Kitchen CALCIUM PO Take by mouth daily.     . CHOLECALCIFEROL PO Take by mouth.    . CYANOCOBALAMIN PO Take by mouth.    Marland Kitchen glucosamine-chondroitin 500-400 MG tablet Take 1 tablet by mouth daily.    Marland Kitchen MAGNESIUM PO Take by mouth daily.    . Omega-3 Fatty Acids (OMEGA 3 PO) Take 1 capsule by mouth.     . risperiDONE (RISPERDAL) 0.5 MG tablet Take 0.5 mg by mouth  2 (two) times daily. 1/4 tablet in am and 1/2 tablet pm    . rivastigmine (EXELON) 13.3 MG/24HR Place 13.3 mg onto the skin daily.    . tamsulosin (FLOMAX) 0.4 MG CAPS capsule Take 2 capsules (0.8 mg total) by mouth daily after supper. 180 capsule 3  . VITAMIN E PO Take 1 capsule by mouth daily.     . rivastigmine (EXELON) 4.6 mg/24hr 12.5 mg.      No facility-administered medications prior to visit.     No Known Allergies  ROS As per HPI  PE: Blood pressure 110/72, pulse 63, temperature 98.4 F (36.9 C), temperature source Oral, resp. rate 16, height 5\' 7"  (1.702 m), weight 150 lb 8 oz (68.3 kg), SpO2 96 %. Gen: Alert, well appearing.  Mitchell Abbott is oriented to person, place, and situation. Repeats things when he talks/answers questions.  Speaks very loud. Overreacts when touched. CV: RRR, no m/r/g.   LUNGS: CTA bilat, nonlabored resps, good aeration in all lung fields. EXT: no clubbing, cyanosis, or edema.    LABS:    Chemistry      Component Value Date/Time   NA 138 06/27/2016 0646   K 4.5 06/27/2016 0646   CL 103 06/27/2016 0646   CO2 25 06/27/2016 0646   BUN 16 06/27/2016 0646   CREATININE 0.98 06/27/2016 0646   CREATININE 1.02 09/05/2015 1528      Component Value Date/Time   CALCIUM 9.2 06/27/2016 0646   ALKPHOS 84 10/11/2014 1232   AST 23 10/11/2014 1232   ALT 16 10/11/2014 1232   BILITOT 0.7 10/11/2014 1232      IMPRESSION AND PLAN:  Advanced dementia with behavioral disturbance. Requires extensive assistance with all ADLs, + wanders occasionally. Wife is EXCELLENT caregiver, very committed to his wellbeing. Continue seeing neurologist.  No med changes today. Continue adult daycare.  BPH with LUTS: flomax at 0.8 mg dose leads to bp drops/dizziness/risk for falls. The 0.4mg  dose is adequate for bladder emptying but not optimal. However, will keep with this dose, plus his wife has noted that sometimes noted waning efficacy of flomax, so she'll give a holiday  off of flomax for a month and during that time she'll use a different alpha blocker that he has been rx'd in the past.  Then when she resumes the flomax it's efficacy seems to be better!  No new recommendations today.  An After Visit Summary was printed and given to the Mitchell Abbott.  FOLLOW UP: Return in about 6 months (around 05/20/2018) for routine chronic illness f/u.  Signed:  Crissie Sickles, MD  11/18/2017     

## 2018-02-02 ENCOUNTER — Ambulatory Visit: Payer: Medicare Other | Admitting: Family Medicine

## 2018-02-07 ENCOUNTER — Ambulatory Visit: Payer: Self-pay | Admitting: Family Medicine

## 2018-02-07 NOTE — Telephone Encounter (Signed)
Noted; agree.  Thank you!

## 2018-02-07 NOTE — Telephone Encounter (Signed)
Wife called in for husband.   She was hard to understand due to her accent but also the information she was giving me was fragmented.    She has taken upon herself to change his medication around from what she was telling me. He is in the hall down on the floor leaning up against the wall.    I can't get him up and his BP is 85/59.    I let her know she needed to call 911.   She went on to tell me she had given him 2 Flomax pills for the last 2 nights and she thinks he is on the floor because she gave him 2 Flomax.   "It's my fault".    See below notes. I instructed her to call 911 so they could get him up plus check on him.   He may need to go to the ED   I called her back at 10:36am to see if she had gotten in contact with 911.   She replied,  "Yes".   I asked her if they were on the way and she replied,  "You didn't think I would call did you?"    I let her know I was just concerned about them and wanted to make sure they got the help they needed.   She thanked me and said,  "They are on the way".     I routed this note to Dr. Idelle Leech office.  Reason for Disposition . Patient sounds very sick or weak to the triager  Answer Assessment - Initial Assessment Questions 1. BLOOD PRESSURE: "What is the blood pressure?" "Did you take at least two measurements 5 minutes apart?"     Wife calling.  He is on the floor leaning against the wall now.   His BP 85/59.   It's my fault.   I gave him Flomax.  I gave him 2 pills of that 2 nights ago.   Yesterday he had a great day.   So I gave him 2 pills of Flomax.   It was my decision to stop the Flomax.   I gave him [employer].   He fainted because of hypotension.    Wife is hard to understand and her history of what is occurring there is hard to follow.   She is deciding whether to give him his medications and how much on her own from what she is telling me.    I think the 2 Flomax is what made him good yesterday.   I gave him 2 Flomax last night again.   So all  of this is my fault per wife.    I give him [place].    He is sitting on the floor and leaning on the wall.   Yes he is awake.    2. ONSET: "When did you take your blood pressure?"     A few minutes ago. 3. HOW: "How did you obtain the blood pressure?" (e.g., visiting nurse, automatic home BP monitor)      4. HISTORY: "Do you have a history of low blood pressure?" "What is your blood pressure normally?"     Has a BP machine  5. MEDICATIONS: "Are you taking any medications for blood pressure?" If yes: "Have they been changed recently?"     See above.   She stopped the Flomax and then started giving him 2 Flomax pills at night because" it helped him have a good day so I gave  him 2 Flomax again last night".   "I  Stopped the Flomax and was giving him [employer]".     6. PULSE RATE: "Do you know what your pulse rate is?"      No 7. OTHER SYMPTOMS: "Have you been sick recently?" "Have you had a recent injury?"     Wife stated,  "It's my fault".    I gave him 2 Flomax pills thinking it would help him have another good day".   8. PREGNANCY: "Is there any chance you are pregnant?" "When was your last menstrual period?"     N/A  Protocols used: LOW BLOOD PRESSURE-A-AH

## 2018-02-16 ENCOUNTER — Ambulatory Visit: Payer: Medicare Other | Admitting: Family Medicine

## 2018-02-27 ENCOUNTER — Telehealth: Payer: Self-pay | Admitting: Family Medicine

## 2018-02-27 NOTE — Telephone Encounter (Signed)
Form placed on Dr. McGowen's desk for review. 

## 2018-02-27 NOTE — Telephone Encounter (Signed)
Patient's wife Mitchell Abbott) dropped off Dept of [employer] Examination for [employer] Permanent Need for Regular Aid and Attendance Form to revised and signed by pcp.  This form was originally completed and signed by pcp on 08/05/17.  The patient recently received a letter stating the request for home health aid through the [place] was denied.  Per patient's wife she has been working with a Chief Executive Officer who advised her an updated form can be be resubmitted to the [place], for reconsideration of this decision.  However, the diagnoses of dementia should be corrected to state dementia, secondary to PTSD(if pcp agrees).  Patient's wife dropped off a copy of the original form completed by pcp, along with a new form that needs to be reviewed and signed by pcp.  Patient's wife requesting call at [phone] when form is ready for pick-up.  Charge sheet generated, attached to forms and placed in md folder at front desk.

## 2018-02-28 NOTE — Telephone Encounter (Signed)
Form pick up from basket at nursing station.  Called patient's wife and left message on voicemail advising form is ready for pick up.  Copy of form was made in order to be scanned in patient's chart.

## 2018-02-28 NOTE — Telephone Encounter (Signed)
Noted  

## 2018-03-12 ENCOUNTER — Encounter (HOSPITAL_COMMUNITY): Payer: Self-pay

## 2018-03-12 ENCOUNTER — Emergency Department (HOSPITAL_COMMUNITY): Payer: Medicare Other

## 2018-03-12 ENCOUNTER — Emergency Department (HOSPITAL_COMMUNITY)
Admission: EM | Admit: 2018-03-12 | Discharge: 2018-03-12 | Disposition: A | Discharge status: Home / Self Care | Payer: Medicare Other | Attending: Emergency Medicine | Admitting: Emergency Medicine

## 2018-03-12 DIAGNOSIS — F0280 Dementia in other diseases classified elsewhere without behavioral disturbance: Secondary | ICD-10-CM | POA: Diagnosis not present

## 2018-03-12 DIAGNOSIS — Y9389 Activity, other specified: Secondary | ICD-10-CM | POA: Insufficient documentation

## 2018-03-12 DIAGNOSIS — Z8673 Personal history of transient ischemic attack (TIA), and cerebral infarction without residual deficits: Secondary | ICD-10-CM | POA: Insufficient documentation

## 2018-03-12 DIAGNOSIS — G309 Alzheimer's disease, unspecified: Secondary | ICD-10-CM | POA: Diagnosis not present

## 2018-03-12 DIAGNOSIS — S51011A Laceration without foreign body of right elbow, initial encounter: Secondary | ICD-10-CM | POA: Insufficient documentation

## 2018-03-12 DIAGNOSIS — S0990XA Unspecified injury of head, initial encounter: Secondary | ICD-10-CM | POA: Diagnosis not present

## 2018-03-12 DIAGNOSIS — S50311A Abrasion of right elbow, initial encounter: Secondary | ICD-10-CM | POA: Diagnosis not present

## 2018-03-12 DIAGNOSIS — W0110XA Fall on same level from slipping, tripping and stumbling with subsequent striking against unspecified object, initial encounter: Secondary | ICD-10-CM | POA: Diagnosis not present

## 2018-03-12 DIAGNOSIS — Z79899 Other long term (current) drug therapy: Secondary | ICD-10-CM | POA: Insufficient documentation

## 2018-03-12 DIAGNOSIS — W19XXXA Unspecified fall, initial encounter: Secondary | ICD-10-CM

## 2018-03-12 DIAGNOSIS — Z7982 Long term (current) use of aspirin: Secondary | ICD-10-CM | POA: Insufficient documentation

## 2018-03-12 DIAGNOSIS — S0093XA Contusion of unspecified part of head, initial encounter: Secondary | ICD-10-CM | POA: Diagnosis not present

## 2018-03-12 DIAGNOSIS — Z87891 Personal history of nicotine dependence: Secondary | ICD-10-CM | POA: Insufficient documentation

## 2018-03-12 DIAGNOSIS — Z85828 Personal history of other malignant neoplasm of skin: Secondary | ICD-10-CM | POA: Diagnosis not present

## 2018-03-12 DIAGNOSIS — Y998 Other external cause status: Secondary | ICD-10-CM | POA: Insufficient documentation

## 2018-03-12 DIAGNOSIS — S0083XA Contusion of other part of head, initial encounter: Secondary | ICD-10-CM | POA: Insufficient documentation

## 2018-03-12 DIAGNOSIS — Y9289 Other specified places as the place of occurrence of the external cause: Secondary | ICD-10-CM | POA: Diagnosis not present

## 2018-03-12 DIAGNOSIS — S199XXA Unspecified injury of neck, initial encounter: Secondary | ICD-10-CM | POA: Diagnosis not present

## 2018-03-12 DIAGNOSIS — R52 Pain, unspecified: Secondary | ICD-10-CM | POA: Diagnosis not present

## 2018-03-12 NOTE — ED Triage Notes (Signed)
Patient fell at the gym working out. Pt was caring dumb bell and was turning a corner and fell. Pt was with his wife. Pt is nonverbal for about 1 year now. Pt sustain small hematoma to his forehead and skin tear to right elbow.

## 2018-03-12 NOTE — Discharge Instructions (Addendum)
Follow-up with your family doctor if any problems 

## 2018-03-12 NOTE — ED Notes (Signed)
Bed: AI90 Expected date:  Expected time:  Means of arrival:  Comments: 84 yo fall; skin tear

## 2018-03-12 NOTE — ED Provider Notes (Signed)
[facility] DEPT Provider Note   CSN: [id number] Arrival date & time: 03/12/18  1601     History   Chief Complaint No chief complaint on file.   HPI Mitchell Abbott is a 84 y.o. male.  Patient fell with his wife.  Patient hit his head and his right elbow.  Patient normally does not speak  The history is provided by a relative. No language interpreter was used.  Fall  This is a new problem. The current episode started 1 to 2 hours ago. The problem occurs rarely. The problem has been resolved. Pertinent negatives include no chest pain, no abdominal pain and no headaches. Nothing aggravates the symptoms. Nothing relieves the symptoms. He has tried nothing for the symptoms. The treatment provided no relief.    Past Medical History:  Diagnosis Date  . Actinic keratoses   . Adrenal hemorrhage ([place]) 2001   s/p fall from ladder--a f/u CT abd showed resolution of the hemorrhage- (duodenal polyp incidentally noted;  f/u EGD by Dr. Watt Climes 03/2000 showed small lipomatous mass in duodenum which was confirmed by biopsy.  . Alzheimer's dementia ([place])    with behav disturbance and apathy.  [facility] referral as of [facility].  They added namenda 07/2014 due to pt's signif decline in the prior 6 mo.  . Arthralgia of knee, right 2009   exam suspicious for meniscal origin  . BPH (benign prostatic hypertrophy)    History of acute urinary retention (2006)  Hx of elevated PSA (7.26) + right sided prostate nodule on DRE by urologist.  PSA did not go down with tx with antibiotics--biopsy was done and showed NO MALIGNANCY but some chronic inflammation was present--turned out related to acute cystitis (enterococcus UTI).  PSA was 1.17 in 07/2011 at urologist's (Dr. Karsten Ro).  F/u at urol is now prn as of 08/2014.  Marland Kitchen BPPV (benign paroxysmal positional vertigo)   . DDD (degenerative disc disease), cervical    C5-6, C6-7 primarily  . Headache in back of head    Neurologist  treated him with PT for this problem and it helped  . Hearing deficit    Bilateral; noise damage in [employer]  Has had hearing aids since about 2008.  Marland Kitchen History of acute cystitis 08/2009   with acute prostatitis (enterococcus faecalis)  . History of solitary pulmonary nodule    Initially noted on CT abd for his adrenal hemorrhage s/p fall from ladder 2001.  F/u CT chest 04/2000 showed that it had shrunk from 65mm to 60mm and had no malignant features.  . History of TIA (transient ischemic attack) @ 2008   02/2001 and 01/2006 carotid dopplers showed 50% or less stenosis.  . Left bundle branch hemiblock    mentioned in dx of old records 04/15/2009  . Migraine headache with aura onset in teens   No migraines since about 2004  . Skin cancer     Patient Active Problem List   Diagnosis Date Noted  . Syncope 06/27/2016  . Maxillary sinusitis 05/27/2015  . Allergic rhinitis 01/20/2015  . Bacterial conjunctivitis of left eye 11/05/2014  . Chronic renal insufficiency, stage II (mild) 09/03/2013  . Observation for suspected malignant neoplasm 09/03/2013  . Preventative health care 09/03/2013  . Orthostatic syncope 02/05/2013  . Mild cognitive impairment 10/20/2012  . Cervical myofascial strain 08/15/2012  . Sacroiliac joint pain 02/18/2012  . History of TIA (transient ischemic attack)   . BPH (benign prostatic hypertrophy) 10/13/2011  . Headache disorder 10/13/2011  .  Dementia ([place]) 10/13/2011    Past Surgical History:  Procedure Laterality Date  . Carotid dopplers  05/2016   1-39% stenosis bilat ICA.  . cataract extrac    . dental     implants  . PROSTATE BIOPSY  04/07/2005   For elevated PSA + right sided prostate nodule on DRE by urologist:  benign biopsy.  . TONSILLECTOMY  preteen  . TRANSTHORACIC ECHOCARDIOGRAM  06/28/2016   EF 60-65%, mild dilatation of ascending aorta.        Home Medications    Prior to Admission medications   Medication Sig Start Date End Date  Taking? Authorizing Provider  aspirin 325 MG EC tablet Take 1 tablet (325 mg total) by mouth daily. 10/20/12  Yes Marcial Pacas, MD  risperiDONE (RISPERDAL) 0.5 MG tablet Take 0.125-0.25 mg by mouth See admin instructions. 1/4 tablet in am and 1/2 tablet pm   Yes [provider]  rivastigmine (EXELON) 13.3 MG/24HR Place 13.3 mg onto the skin daily.   Yes [provider]  tamsulosin (FLOMAX) 0.4 MG CAPS capsule Take 2 capsules (0.8 mg total) by mouth daily after supper. Patient taking differently: Take 0.4 mg by mouth daily after supper.  08/11/16  Yes McGowen, Adrian Blackwater, MD    Family History Family History  Problem Relation Age of Onset  . Sudden death Mother 69       FH info obtained from Dr. Ovid Curd records.  . Breast cancer Daughter   . Migraines Daughter     Social History Social History   Tobacco Use  . Smoking status: Former Smoker    Years: 2.00    Types: Cigarettes  . Smokeless tobacco: Never Used  Substance Use Topics  . Alcohol use: Yes    Alcohol/week: 1.0 standard drinks    Types: 1 Cans of beer per week    Comment: "a good drink every day" BEER/WINE  . Drug use: No     Allergies   Patient has no known allergies.   Review of Systems Review of Systems  Constitutional: Negative for appetite change and fatigue.  HENT: Negative for congestion, ear discharge and sinus pressure.   Eyes: Negative for discharge.  Respiratory: Negative for cough.   Cardiovascular: Negative for chest pain.  Gastrointestinal: Negative for abdominal pain and diarrhea.  Genitourinary: Negative for frequency and hematuria.  Musculoskeletal: Negative for back pain.  Skin: Negative for rash.  Neurological: Negative for seizures and headaches.  Psychiatric/Behavioral: Negative for hallucinations.     Physical Exam Updated Vital Signs BP 139/74 (BP Location: Right Arm)   Pulse (!) 59   Temp 97.6 F (36.4 C) (Axillary)   SpO2 98%   Physical Exam  Constitutional: He  appears well-developed.  HENT:  Head: Normocephalic.  Forehead  Eyes: Conjunctivae and EOM are normal. No scleral icterus.  Neck: Neck supple. No thyromegaly present.  Cardiovascular: Normal rate and regular rhythm. Exam reveals no gallop and no friction rub.  No murmur heard. Pulmonary/Chest: No stridor. He has no wheezes. He has no rales. He exhibits no tenderness.  Abdominal: He exhibits no distension. There is no tenderness. There is no rebound.  Musculoskeletal: Normal range of motion. He exhibits no edema.  Laceration right elbow  Lymphadenopathy:    He has no cervical adenopathy.  Neurological: He is alert. He exhibits normal muscle tone. Coordination normal.  She is alert but is nonverbal this is his normal  Skin: No rash noted. No erythema.     ED Treatments /  Results  Labs (all labs ordered are listed, but only abnormal results are displayed) Labs Reviewed - No data to display  EKG None  Radiology Dg Elbow Complete Right  Result Date: 03/12/2018 CLINICAL DATA:  Right elbow trauma secondary to a fall.  Abrasion. EXAM: RIGHT ELBOW - COMPLETE 3+ VIEW COMPARISON:  None. FINDINGS: There is no evidence of fracture, dislocation, or joint effusion. Chronic degenerative changes at the triceps insertion on the olecranon. Soft tissues are unremarkable. IMPRESSION: No acute abnormalities. Electronically Signed   By: Lorriane Shire M.D.   On: 03/12/2018 16:41   Ct Head Wo Contrast  Result Date: 03/12/2018 CLINICAL DATA:  Fall.  Forehead hematoma. EXAM: CT HEAD WITHOUT CONTRAST CT CERVICAL SPINE WITHOUT CONTRAST TECHNIQUE: Multidetector CT imaging of the head and cervical spine was performed following the standard protocol without intravenous contrast. Multiplanar CT image reconstructions of the cervical spine were also generated. COMPARISON:  CT head and cervical spine dated June 27, 2016. FINDINGS: CT HEAD FINDINGS Brain: No evidence of acute infarction, hemorrhage,  hydrocephalus, extra-axial collection or mass lesion/mass effect. Stable moderate atrophy and chronic microvascular ischemic changes. Vascular: Atherosclerotic vascular calcification of the carotid siphons. No hyperdense vessel. Skull: Normal. Negative for fracture or focal lesion. Sinuses/Orbits: No acute finding. Partially visualized chronic right maxillary sinusitis. Other: None. CT CERVICAL SPINE FINDINGS Alignment: Stable 3 mm anterolisthesis at C4-C5. Stable 4 mm anterolisthesis at C7-T1. Stable 2 mm anterolisthesis at T1-T2. No traumatic malalignment. Skull base and vertebrae: No acute fracture. No primary bone lesion or focal pathologic process. Soft tissues and spinal canal: No prevertebral fluid or swelling. No visible canal hematoma. Disc levels: Multilevel severe disc height loss and facet uncovertebral hypertrophy resulting in multilevel foraminal stenosis, unchanged. Upper chest: Negative. Other: None. IMPRESSION: 1. No acute intracranial abnormality. Stable atrophy and chronic microvascular ischemic changes. 2. No acute cervical spine fracture. Stable severe cervical spondylosis. Electronically Signed   By: Titus Dubin M.D.   On: 03/12/2018 17:29   Ct Cervical Spine Wo Contrast  Result Date: 03/12/2018 CLINICAL DATA:  Fall.  Forehead hematoma. EXAM: CT HEAD WITHOUT CONTRAST CT CERVICAL SPINE WITHOUT CONTRAST TECHNIQUE: Multidetector CT imaging of the head and cervical spine was performed following the standard protocol without intravenous contrast. Multiplanar CT image reconstructions of the cervical spine were also generated. COMPARISON:  CT head and cervical spine dated June 27, 2016. FINDINGS: CT HEAD FINDINGS Brain: No evidence of acute infarction, hemorrhage, hydrocephalus, extra-axial collection or mass lesion/mass effect. Stable moderate atrophy and chronic microvascular ischemic changes. Vascular: Atherosclerotic vascular calcification of the carotid siphons. No hyperdense vessel.  Skull: Normal. Negative for fracture or focal lesion. Sinuses/Orbits: No acute finding. Partially visualized chronic right maxillary sinusitis. Other: None. CT CERVICAL SPINE FINDINGS Alignment: Stable 3 mm anterolisthesis at C4-C5. Stable 4 mm anterolisthesis at C7-T1. Stable 2 mm anterolisthesis at T1-T2. No traumatic malalignment. Skull base and vertebrae: No acute fracture. No primary bone lesion or focal pathologic process. Soft tissues and spinal canal: No prevertebral fluid or swelling. No visible canal hematoma. Disc levels: Multilevel severe disc height loss and facet uncovertebral hypertrophy resulting in multilevel foraminal stenosis, unchanged. Upper chest: Negative. Other: None. IMPRESSION: 1. No acute intracranial abnormality. Stable atrophy and chronic microvascular ischemic changes. 2. No acute cervical spine fracture. Stable severe cervical spondylosis. Electronically Signed   By: Titus Dubin M.D.   On: 03/12/2018 17:29    Procedures Procedures (including critical care time)  Medications Ordered in ED Medications - No data to display  Initial Impression / Assessment and Plan / ED Course  I have reviewed the triage vital signs and the nursing notes.  Pertinent labs & imaging results that were available during my care of the patient were reviewed by me and considered in my medical decision making (see chart for details).     CT head and x-rays unremarkable.  Patient with fall contusion to head he will follow-up with his PCP  Final Clinical Impressions(s) / ED Diagnoses   Final diagnoses:  Fall, initial encounter    ED Discharge Orders    None       Milton Ferguson, MD 03/12/18 1811

## 2018-03-24 ENCOUNTER — Ambulatory Visit (INDEPENDENT_AMBULATORY_CARE_PROVIDER_SITE_OTHER): Payer: Medicare Other

## 2018-03-24 DIAGNOSIS — Z23 Encounter for immunization: Secondary | ICD-10-CM | POA: Diagnosis not present

## 2018-05-19 ENCOUNTER — Ambulatory Visit (INDEPENDENT_AMBULATORY_CARE_PROVIDER_SITE_OTHER): Payer: Medicare Other | Admitting: Family Medicine

## 2018-05-19 ENCOUNTER — Encounter: Payer: Self-pay | Admitting: Family Medicine

## 2018-05-19 VITALS — BP 134/80 | HR 73 | Temp 97.0°F | Resp 16 | Ht 67.0 in | Wt 150.1 lb

## 2018-05-19 DIAGNOSIS — N138 Other obstructive and reflux uropathy: Secondary | ICD-10-CM | POA: Diagnosis not present

## 2018-05-19 DIAGNOSIS — N401 Enlarged prostate with lower urinary tract symptoms: Secondary | ICD-10-CM | POA: Diagnosis not present

## 2018-05-19 DIAGNOSIS — F0390 Unspecified dementia without behavioral disturbance: Secondary | ICD-10-CM | POA: Diagnosis not present

## 2018-05-19 DIAGNOSIS — F0391 Unspecified dementia with behavioral disturbance: Secondary | ICD-10-CM

## 2018-05-19 DIAGNOSIS — F03C0 Unspecified dementia, severe, without behavioral disturbance, psychotic disturbance, mood disturbance, and anxiety: Secondary | ICD-10-CM

## 2018-05-19 NOTE — Patient Instructions (Signed)
Apply eucerin cream to scaly/dry skin at least once a day.

## 2018-05-19 NOTE — Progress Notes (Signed)
OFFICE VISIT  05/31/2018   CC:  Chief Complaint  Patient presents with  . Follow-up    RCI, pt is not fasting.    HPI:    Patient is a 84 y.o. Caucasian male who presents for 6 mo f/u advanced dementia and BPH. He is essentially the same.  Incontinent of urine and stool. Saw urol at the [place] yesterday, wife seems to think the [facility] is rx'ing a new med for him. He has no side effects taking ONE of the 0.4mg  tamsulosin. He did get a UA done but wife doesn't know the result.  His urine does appear concentrated and is malodorous.    Behavior: screaming out, loud outbursts, nonsensical speech, but no habit of purposeful physical aggression. Still showing a bit of a sense of humor.  Wife wants to still take care of him up to the point of when he can no longer recognize her.  Also, if he becomes purposefully physically aggressive and she is living in fear then she'll pursue other modes of care. He still goes to adult daycare and gets respite care.  Wife checks bp sometimes, mainly when he is moving very slowly and it is 90s over 92s. She has him stand slowly and gives him fluids and he responds.  He PO intake is pretty sporadic.    Past Medical History:  Diagnosis Date  . Actinic keratoses   . Adrenal hemorrhage ([place]) 2001   s/p fall from ladder--a f/u CT abd showed resolution of the hemorrhage- (duodenal polyp incidentally noted;  f/u EGD by Dr. Watt Climes 03/2000 showed small lipomatous mass in duodenum which was confirmed by biopsy.  . Alzheimer's dementia ([place])    with behav disturbance and apathy.  [facility] referral as of 07/2013.  They added namenda 07/2014 due to pt's signif decline in the prior 6 mo.  . Arthralgia of knee, right 2009   exam suspicious for meniscal origin  . BPH (benign prostatic hypertrophy)    History of acute urinary retention (2006)  Hx of elevated PSA (7.26) + right sided prostate nodule on DRE by urologist.  PSA did not go down with tx with  antibiotics--biopsy was done and showed NO MALIGNANCY but some chronic inflammation was present--turned out related to acute cystitis (enterococcus UTI).  PSA was 1.17 in 07/2011 at urologist's (Dr. Karsten Ro).  F/u at urol is now prn as of 08/2014.  Marland Kitchen BPPV (benign paroxysmal positional vertigo)   . DDD (degenerative disc disease), cervical    C5-6, C6-7 primarily  . Headache in back of head    Neurologist treated him with PT for this problem and it helped  . Hearing deficit    Bilateral; noise damage in [employer]  Has had hearing aids since about 2008.  Marland Kitchen History of acute cystitis 08/2009   with acute prostatitis (enterococcus faecalis)  . History of solitary pulmonary nodule    Initially noted on CT abd for his adrenal hemorrhage s/p fall from ladder 2001.  F/u CT chest 04/2000 showed that it had shrunk from 96mm to 57mm and had no malignant features.  . History of TIA (transient ischemic attack) @ 2008   02/2001 and 01/2006 carotid dopplers showed 50% or less stenosis.  . Left bundle branch hemiblock    mentioned in dx of old records 04/15/2009  . Migraine headache with aura onset in teens   No migraines since about 2004  . Skin cancer     Past Surgical History:  Procedure Laterality Date  .  Carotid dopplers  05/2016   1-39% stenosis bilat ICA.  . cataract extrac    . dental     implants  . PROSTATE BIOPSY  04/07/2005   For elevated PSA + right sided prostate nodule on DRE by urologist:  benign biopsy.  . TONSILLECTOMY  preteen  . TRANSTHORACIC ECHOCARDIOGRAM  06/28/2016   EF 60-65%, mild dilatation of ascending aorta.    Outpatient Medications Prior to Visit  Medication Sig Dispense Refill  . aspirin 325 MG EC tablet Take 1 tablet (325 mg total) by mouth daily. 90 tablet 3  . risperiDONE (RISPERDAL) 0.5 MG tablet Take 0.125-0.25 mg by mouth See admin instructions. 1/4 tablet in am and 1/2 tablet pm    . rivastigmine (EXELON) 13.3 MG/24HR Place 13.3 mg onto the skin daily.    .  tamsulosin (FLOMAX) 0.4 MG CAPS capsule Take 2 capsules (0.8 mg total) by mouth daily after supper. (Patient taking differently: Take 0.4 mg by mouth daily after supper. ) 180 capsule 3   No facility-administered medications prior to visit.     No Known Allergies  ROS As per HPI  PE: Blood pressure 134/80, pulse 73, temperature (!) 97 F (36.1 C), temperature source Temporal, resp. rate 16, height 5\' 7"  (1.702 m), weight 150 lb 2 oz (68.1 kg), SpO2 98 %. Body mass index is 23.51 kg/m.  Gen: Alert, well appearing.  Patient is oriented to person and general situation. Yells out randomly.  NAD. TMH:DQQI: no injection, icteris, swelling, or exudate.  EOMI, PERRLA. Mouth: lips without lesion/swelling.  Oral mucosa pink and moist. Oropharynx without erythema, exudate, or swelling.  CV: RRR, no m/r/g.   LUNGS: CTA bilat, nonlabored resps, good aeration in all lung fields. EXT: no clubbing or cyanosis.  no edema.     LABS:  Lab Results  Component Value Date   TSH 1.41 10/13/2011   Lab Results  Component Value Date   WBC 5.4 06/28/2016   HGB 14.2 06/28/2016   HCT 42.2 06/28/2016   MCV 97.5 06/28/2016   PLT 255 06/28/2016   Lab Results  Component Value Date   CREATININE 0.98 06/27/2016   BUN 16 06/27/2016   NA 138 06/27/2016   K 4.5 06/27/2016   CL 103 06/27/2016   CO2 25 06/27/2016   Lab Results  Component Value Date   ALT 16 10/11/2014   AST 23 10/11/2014   ALKPHOS 84 10/11/2014   BILITOT 0.7 10/11/2014   Lab Results  Component Value Date   CHOL 168 10/22/2014   Lab Results  Component Value Date   HDL 53.10 10/22/2014   Lab Results  Component Value Date   LDLCALC 97 10/22/2014   Lab Results  Component Value Date   TRIG 91.0 10/22/2014   Lab Results  Component Value Date   CHOLHDL 3 10/22/2014   Lab Results  Component Value Date   PSA 1.73 08/24/2013   PSA 1.30 01/05/2012    IMPRESSION AND PLAN:  1) Advanced dementia with behav disturbance:  gradually progressing. Wife still able to take care of him but he requires 24/7 care and full assistance with nearly every ADL.  He is not violent or physically aggressive. Still willing to eat and drink w/out problem. Wife wants to continue being his caregiver at home until the point where he cannot recognize her, if that point comes. The current medical regimen is effective;  continue present plan and medications.  2) BPH: tolerates low dose flomax. No signif problem with  orthostatic dizziness at this time. Encouraged adequate fluid intake in daytime, limit fluids after suppertime.  Spent 25 min with pt today, with >50% of this time spent in counseling and care coordination regarding the above problems.  An After Visit Summary was printed and given to the patient.  FOLLOW UP: Return in about 6 months (around 11/18/2018) for routine chronic illness f/u.  Signed:  Crissie Sickles, MD           05/31/2018

## 2018-06-28 ENCOUNTER — Emergency Department (HOSPITAL_COMMUNITY)
Admission: EM | Admit: 2018-06-28 | Discharge: 2018-06-28 | Disposition: A | Discharge status: Home / Self Care | Payer: Medicare Other | Attending: Emergency Medicine | Admitting: Emergency Medicine

## 2018-06-28 ENCOUNTER — Emergency Department (HOSPITAL_COMMUNITY): Payer: Medicare Other

## 2018-06-28 DIAGNOSIS — R0602 Shortness of breath: Secondary | ICD-10-CM | POA: Diagnosis not present

## 2018-06-28 DIAGNOSIS — R55 Syncope and collapse: Secondary | ICD-10-CM | POA: Diagnosis not present

## 2018-06-28 DIAGNOSIS — F0390 Unspecified dementia without behavioral disturbance: Secondary | ICD-10-CM | POA: Insufficient documentation

## 2018-06-28 DIAGNOSIS — Z79899 Other long term (current) drug therapy: Secondary | ICD-10-CM | POA: Diagnosis not present

## 2018-06-28 DIAGNOSIS — R404 Transient alteration of awareness: Secondary | ICD-10-CM | POA: Diagnosis not present

## 2018-06-28 DIAGNOSIS — R05 Cough: Secondary | ICD-10-CM | POA: Diagnosis not present

## 2018-06-28 DIAGNOSIS — I951 Orthostatic hypotension: Secondary | ICD-10-CM | POA: Diagnosis not present

## 2018-06-28 DIAGNOSIS — I4510 Unspecified right bundle-branch block: Secondary | ICD-10-CM | POA: Diagnosis not present

## 2018-06-28 DIAGNOSIS — Z87891 Personal history of nicotine dependence: Secondary | ICD-10-CM | POA: Diagnosis not present

## 2018-06-28 DIAGNOSIS — E860 Dehydration: Secondary | ICD-10-CM | POA: Diagnosis not present

## 2018-06-28 LAB — CBC WITH DIFFERENTIAL/PLATELET
Abs Immature Granulocytes: 0.04 10*3/uL (ref 0.00–0.07)
Basophils Absolute: 0 10*3/uL (ref 0.0–0.1)
Basophils Relative: 0 %
Eosinophils Absolute: 0.2 10*3/uL (ref 0.0–0.5)
Eosinophils Relative: 2 %
HCT: 43.6 % (ref 39.0–52.0)
Hemoglobin: 13.8 g/dL (ref 13.0–17.0)
Immature Granulocytes: 1 %
Lymphocytes Relative: 12 %
Lymphs Abs: 0.8 10*3/uL (ref 0.7–4.0)
MCH: 32.1 pg (ref 26.0–34.0)
MCHC: 31.7 g/dL (ref 30.0–36.0)
MCV: 101.4 fL — ABNORMAL HIGH (ref 80.0–100.0)
Monocytes Absolute: 0.8 10*3/uL (ref 0.1–1.0)
Monocytes Relative: 12 %
Neutro Abs: 5.2 10*3/uL (ref 1.7–7.7)
Neutrophils Relative %: 73 %
Platelets: 293 10*3/uL (ref 150–400)
RBC: 4.3 MIL/uL (ref 4.22–5.81)
RDW: 13 % (ref 11.5–15.5)
WBC: 7.1 10*3/uL (ref 4.0–10.5)
nRBC: 0 % (ref 0.0–0.2)

## 2018-06-28 LAB — URINALYSIS, ROUTINE W REFLEX MICROSCOPIC
Bacteria, UA: NONE SEEN
Bilirubin Urine: NEGATIVE
Glucose, UA: NEGATIVE mg/dL
Hgb urine dipstick: NEGATIVE
Ketones, ur: NEGATIVE mg/dL
Nitrite: NEGATIVE
Protein, ur: 30 mg/dL — AB
Specific Gravity, Urine: 1.021 (ref 1.005–1.030)
pH: 5 (ref 5.0–8.0)

## 2018-06-28 LAB — COMPREHENSIVE METABOLIC PANEL
ALT: 16 U/L (ref 0–44)
AST: 22 U/L (ref 15–41)
Albumin: 3.3 g/dL — ABNORMAL LOW (ref 3.5–5.0)
Alkaline Phosphatase: 64 U/L (ref 38–126)
Anion gap: 9 (ref 5–15)
BUN: 18 mg/dL (ref 8–23)
CO2: 24 mmol/L (ref 22–32)
Calcium: 9.1 mg/dL (ref 8.9–10.3)
Chloride: 106 mmol/L (ref 98–111)
Creatinine, Ser: 1.05 mg/dL (ref 0.61–1.24)
GFR calc Af Amer: >60 mL/min (ref >60)
GFR calc non Af Amer: >60 mL/min (ref >60)
Glucose, Bld: 106 mg/dL — ABNORMAL HIGH (ref 70–99)
Potassium: 4.8 mmol/L (ref 3.5–5.1)
Sodium: 139 mmol/L (ref 135–145)
Total Bilirubin: 0.7 mg/dL (ref 0.3–1.2)
Total Protein: 5.9 g/dL — ABNORMAL LOW (ref 6.5–8.1)

## 2018-06-28 LAB — TROPONIN I: Troponin I: <0.03 ng/mL (ref <0.03)

## 2018-06-28 MED ORDER — SODIUM CHLORIDE 0.9 % IV BOLUS
500.0000 mL | Freq: Once | INTRAVENOUS | Status: AC
  Administered 2018-06-28: 500 mL via INTRAVENOUS

## 2018-06-28 NOTE — ED Triage Notes (Signed)
[facility]- per report pt fell against wall and was lowly sat down in floor by staff. Per ems pt did not fall was lowered to the floor. Unclear as to weather he has a LOC or not- on ems arrival pt was sitting in wheel chair at baseline.

## 2018-06-28 NOTE — ED Notes (Signed)
Pt s wife tired of waiting  She wants to skip the c-t  Dr Ellender Hose is going to discharge him home after he eats

## 2018-06-28 NOTE — ED Notes (Signed)
Regular diet ordered.

## 2018-06-28 NOTE — ED Provider Notes (Signed)
[facility] Provider Note   CSN: [id number] Arrival date & time: 06/28/18  1115     History   Chief Complaint Chief Complaint  Patient presents with  . Near Syncope    HPI Mitchell Abbott is a 85 y.o. male.  HPI   85 yo M with h/o alzheimer's here with possible near syncopal episode. Pt reportedly was in usual state of health today, at baseline. He was at [place] when he fell backward against a wall, then slowly sat down w/ staff assistance. No LOC. No seizure like activity. He is reportedly at his baseline mental status now.  Level 5 caveat invoked as remainder of history, ROS, and physical exam limited due to patient's dementia, non-verbal status.    Past Medical History:  Diagnosis Date  . Actinic keratoses   . Adrenal hemorrhage ([place]) 2001   s/p fall from ladder--a f/u CT abd showed resolution of the hemorrhage- (duodenal polyp incidentally noted;  f/u EGD by Dr. Watt Climes 03/2000 showed small lipomatous mass in duodenum which was confirmed by biopsy.  . Alzheimer's dementia ([place])    with behav disturbance and apathy.  [facility] referral as of 07/[facility].  They added namenda 07/2014 due to pt's signif decline in the prior 6 mo.  . Arthralgia of knee, right 2009   exam suspicious for meniscal origin  . BPH (benign prostatic hypertrophy)    History of acute urinary retention (2006)  Hx of elevated PSA (7.26) + right sided prostate nodule on DRE by urologist.  PSA did not go down with tx with antibiotics--biopsy was done and showed NO MALIGNANCY but some chronic inflammation was present--turned out related to acute cystitis (enterococcus UTI).  PSA was 1.17 in 07/2011 at urologist's (Dr. Karsten Ro).  F/u at urol is now prn as of 08/2014.  Marland Kitchen BPPV (benign paroxysmal positional vertigo)   . DDD (degenerative disc disease), cervical    C5-6, C6-7 primarily  . Headache in back of head    Neurologist treated him with PT for this problem  and it helped  . Hearing deficit    Bilateral; noise damage in [employer]  Has had hearing aids since about 2008.  Marland Kitchen History of acute cystitis 08/2009   with acute prostatitis (enterococcus faecalis)  . History of solitary pulmonary nodule    Initially noted on CT abd for his adrenal hemorrhage s/p fall from ladder 2001.  F/u CT chest 04/2000 showed that it had shrunk from 51mm to 5mm and had no malignant features.  . History of TIA (transient ischemic attack) @ 2008   02/2001 and 01/2006 carotid dopplers showed 50% or less stenosis.  . Left bundle branch hemiblock    mentioned in dx of old records 04/15/2009  . Migraine headache with aura onset in teens   No migraines since about 2004  . Skin cancer     Patient Active Problem List   Diagnosis Date Noted  . Syncope 06/27/2016  . Maxillary sinusitis 05/27/2015  . Allergic rhinitis 01/20/2015  . Bacterial conjunctivitis of left eye 11/05/2014  . Chronic renal insufficiency, stage II (mild) 09/03/2013  . Observation for suspected malignant neoplasm 09/03/2013  . Preventative health care 09/03/2013  . Orthostatic syncope 02/05/2013  . Mild cognitive impairment 10/20/2012  . Cervical myofascial strain 08/15/2012  . Sacroiliac joint pain 02/18/2012  . History of TIA (transient ischemic attack)   . BPH (benign prostatic hypertrophy) 10/13/2011  . Headache disorder 10/13/2011  . Dementia ([place])  10/13/2011    Past Surgical History:  Procedure Laterality Date  . Carotid dopplers  05/2016   1-39% stenosis bilat ICA.  . cataract extrac    . dental     implants  . PROSTATE BIOPSY  04/07/2005   For elevated PSA + right sided prostate nodule on DRE by urologist:  benign biopsy.  . TONSILLECTOMY  preteen  . TRANSTHORACIC ECHOCARDIOGRAM  06/28/2016   EF 60-65%, mild dilatation of ascending aorta.        Home Medications    Prior to Admission medications   Medication Sig Start Date End Date Taking? Authorizing Provider  aspirin 325  MG EC tablet Take 1 tablet (325 mg total) by mouth daily. 10/20/12  Yes Marcial Pacas, MD  risperiDONE (RISPERDAL) 0.5 MG tablet Take 0.125-0.25 mg by mouth See admin instructions. 1/4 tablet in am and 1/2 tablet pm   Yes [provider]  rivastigmine (EXELON) 13.3 MG/24HR Place 13.3 mg onto the skin daily.   Yes [provider]  tamsulosin (FLOMAX) 0.4 MG CAPS capsule Take 2 capsules (0.8 mg total) by mouth daily after supper. Patient taking differently: Take 0.4 mg by mouth daily after supper.  08/11/16  Yes McGowen, Adrian Blackwater, MD    Family History Family History  Problem Relation Age of Onset  . Sudden death Mother 77       FH info obtained from Dr. Ovid Curd records.  . Breast cancer Daughter   . Migraines Daughter     Social History Social History   Tobacco Use  . Smoking status: Former Smoker    Years: 2.00    Types: Cigarettes  . Smokeless tobacco: Never Used  Substance Use Topics  . Alcohol use: Yes    Alcohol/week: 1.0 standard drinks    Types: 1 Cans of beer per week    Comment: "a good drink every day" BEER/WINE  . Drug use: No     Allergies   Patient has no known allergies.   Review of Systems Review of Systems  Unable to perform ROS: Dementia     Physical Exam Updated Vital Signs BP (!) 175/97   Pulse 62   Temp 97.9 F (36.6 C) (Oral)   Resp (!) 22   SpO2 97%   Physical Exam Vitals signs and nursing note reviewed.  Constitutional:      General: He is not in acute distress.    Appearance: He is well-developed.  HENT:     Head: Normocephalic and atraumatic.  Eyes:     Conjunctiva/sclera: Conjunctivae normal.  Neck:     Musculoskeletal: Neck supple.  Cardiovascular:     Rate and Rhythm: Normal rate and regular rhythm.     Heart sounds: Normal heart sounds. No murmur. No friction rub.  Pulmonary:     Effort: Pulmonary effort is normal. No respiratory distress.     Breath sounds: Normal breath sounds. No wheezing or rales.    Abdominal:     General: There is no distension.     Palpations: Abdomen is soft.     Tenderness: There is no abdominal tenderness.     Comments: Moderate suprapubic TTP  Skin:    General: Skin is warm.     Capillary Refill: Capillary refill takes less than 2 seconds.  Neurological:     Mental Status: He is alert.     Motor: No abnormal muscle tone.     Comments: Non-verbal but orients eyes to examiner. MAE. Face symmetric. Responds to noxious  stimuli w/ localization.      ED Treatments / Results  Labs (all labs ordered are listed, but only abnormal results are displayed) Labs Reviewed  URINALYSIS, ROUTINE W REFLEX MICROSCOPIC - Abnormal; Notable for the following components:      Result Value   APPearance HAZY (*)    Protein, ur 30 (*)    Leukocytes, UA TRACE (*)    All other components within normal limits  CBC WITH DIFFERENTIAL/PLATELET - Abnormal; Notable for the following components:   MCV 101.4 (*)    All other components within normal limits  COMPREHENSIVE METABOLIC PANEL - Abnormal; Notable for the following components:   Glucose, Bld 106 (*)    Total Protein 5.9 (*)    Albumin 3.3 (*)    All other components within normal limits  TROPONIN I    EKG EKG Interpretation  Date/Time:  Wednesday June 28 2018 11:24:36 EST Ventricular Rate:  60 PR Interval:    QRS Duration: 118 QT Interval:  455 QTC Calculation: 455 R Axis:   -76 Text Interpretation:  Sinus rhythm Incomplete RBBB and LAFB No significant change since last tracing Confirmed by Duffy Bruce [phone]) on 06/28/2018 11:50:23 AM Also confirmed by Duffy Bruce [phone]), editor Philomena Doheny [phone])  on 06/28/2018 11:52:46 AM   Radiology Dg Chest 2 View  Result Date: 06/28/2018 CLINICAL DATA:  Cough and shortness of breath EXAM: CHEST - 2 VIEW COMPARISON:  06/27/2016 FINDINGS: Shallow lung inflation with bibasilar atelectasis. Mild cardiomegaly, unchanged. No focal airspace consolidation or pulmonary  edema. IMPRESSION: Hypoinflation with bibasilar atelectasis. Electronically Signed   By: Ulyses Jarred M.D.   On: 06/28/2018 14:35    Procedures Procedures (including critical care time)  Medications Ordered in ED Medications  sodium chloride 0.9 % bolus 500 mL (0 mLs Intravenous Stopped 06/28/18 1505)     Initial Impression / Assessment and Plan / ED Course  I have reviewed the triage vital signs and the nursing notes.  Pertinent labs & imaging results that were available during my care of the patient were reviewed by me and considered in my medical decision making (see chart for details).     85 yo M here with near syncope episode. On further interview with wife and EMS, staff, pt was sitting down, stood up to go to the restroom then got near syncopal. Pt has a documented h/o similar episodes with known orthostasis. Pt given gentle fluids here and is at baseline mentally. Labs are reassuring. EKG non-ischemic and no ectopy or arrhythmia on telemetry. Pt wife at bedside, would like to hold on CT head which I think is reasonable given his age, absence of any trauma, and absence of any focal neuro deficits. Reviewed his meds - he is on Risperdal and this could be partially contributing to his orthostasis. Encouraged wife to discuss with his physician. Encourage fluids. No apparent emergent pathology identified and pt wife requesting to take him home, which is very reasonable given his age, comorbidities, and known h/o similar instances.  Final Clinical Impressions(s) / ED Diagnoses   Final diagnoses:  Orthostasis  Dehydration    ED Discharge Orders    None       Duffy Bruce, MD 06/28/18 [id number]

## 2018-06-28 NOTE — ED Notes (Signed)
Ordered diet tray for pt  

## 2018-06-28 NOTE — ED Notes (Signed)
Pt transported to [place] and CT with wife.

## 2018-06-28 NOTE — ED Notes (Signed)
Family at bedside. 

## 2018-06-28 NOTE — Discharge Instructions (Addendum)
You can try decreasing your night-time Risperdal to one quarter pill instead of one half, which could help his dizziness.  Make sure he drinks plenty of fluids.

## 2018-07-25 ENCOUNTER — Encounter: Payer: Self-pay | Admitting: Internal Medicine

## 2018-07-25 ENCOUNTER — Ambulatory Visit (INDEPENDENT_AMBULATORY_CARE_PROVIDER_SITE_OTHER): Payer: Medicare Other | Admitting: Internal Medicine

## 2018-07-25 DIAGNOSIS — R21 Rash and other nonspecific skin eruption: Secondary | ICD-10-CM | POA: Diagnosis not present

## 2018-07-25 DIAGNOSIS — B029 Zoster without complications: Secondary | ICD-10-CM

## 2018-07-25 HISTORY — DX: Zoster without complications: B02.9

## 2018-07-25 MED ORDER — VALACYCLOVIR HCL 1 G PO TABS: 1000.0000 mg | ORAL_TABLET | Freq: Two times a day (BID) | ORAL | 0 refills | Status: DC

## 2018-07-25 NOTE — Patient Instructions (Signed)
We have sent in valtrex to take 1 pill 2 times per day for 1 week.

## 2018-07-25 NOTE — Progress Notes (Signed)
   Subjective:   Patient ID: Mitchell Abbott, male    DOB: 05-Jun-1923, 85 y.o.   MRN: [id number]  HPI The patient is a 85 YO man coming in for rash with family member who provides history due to dementia. She noticed rash a few days ago and it started on the back and went around the side. Some blistering and then some fluid clear leaked on his shirt. He denies pain and has no scratching.   Review of Systems  Unable to perform ROS: Dementia  Skin: Positive for rash.    Objective:  Physical Exam Constitutional:      Appearance: Normal appearance.  HENT:     Head: Normocephalic and atraumatic.  Neck:     Musculoskeletal: Normal range of motion and neck supple.  Cardiovascular:     Rate and Rhythm: Normal rate.  Pulmonary:     Effort: Pulmonary effort is normal.  Abdominal:     General: Abdomen is flat.     Palpations: Abdomen is soft.  Skin:    General: Skin is warm and dry.     Findings: Rash present.     Comments: Rash consistent with shingles on the left back and around the left flank with some blistering, patient not tender to touch  Neurological:     Comments: Barely responsive orally and hard of hearing but at baseline per family member, follows 1 step command     Vitals:   07/25/18 1417 07/25/18 1442  BP: 140/84 110/80  Pulse: 72   Temp: 98.1 F (36.7 C)   TempSrc: Oral   SpO2: 98%   Weight: 152 lb (68.9 kg)   Height: 5\' 7"  (1.702 m)     Assessment & Plan:

## 2018-07-25 NOTE — Assessment & Plan Note (Signed)
Could be shingles and rx for valtrex. There is no pain on exam. They decline cream needed. Observe. Advised no shingles vaccine for at least 1 year from resolution.

## 2018-07-26 ENCOUNTER — Encounter: Payer: Self-pay | Admitting: Family Medicine

## 2018-08-02 ENCOUNTER — Encounter: Payer: Self-pay | Admitting: Family Medicine

## 2018-08-02 ENCOUNTER — Ambulatory Visit (INDEPENDENT_AMBULATORY_CARE_PROVIDER_SITE_OTHER): Payer: Medicare Other | Admitting: Family Medicine

## 2018-08-02 VITALS — BP 123/81 | HR 69 | Temp 97.4°F | Resp 16 | Ht 67.0 in | Wt 147.6 lb

## 2018-08-02 DIAGNOSIS — L22 Diaper dermatitis: Secondary | ICD-10-CM

## 2018-08-02 DIAGNOSIS — R229 Localized swelling, mass and lump, unspecified: Secondary | ICD-10-CM

## 2018-08-02 DIAGNOSIS — R238 Other skin changes: Secondary | ICD-10-CM | POA: Diagnosis not present

## 2018-08-02 DIAGNOSIS — B029 Zoster without complications: Secondary | ICD-10-CM

## 2018-08-02 NOTE — Progress Notes (Signed)
OFFICE VISIT  08/02/2018   CC:  Chief Complaint  Patient presents with  . puffy foot/skin trouble    left   HPI:    Patient is a 85 y.o. Caucasian male with severe dementia with behavioral disturbance who presents accompanied by his wife for concern of skin care. Has to wear depends, has chronic diaper dermatitis that she asks me to look at to make sure it is alright--she applies 40% zinc oxide cream to it regularly.    He has a bit of swelling in focal area on top of L foot near the toes at end of day, goes down after bedtime. No pain.  Feet are violaceous hue chronically.  Apparently at the [facility] recently (when establishing care with them/annual primary care exam) his foot felt cold and the MD there ordered an ABI to be done--scheduled for 08/15/2018.  Had shingles about 2 wks ago, treated with valtrex, rash drying well, still very sensitive to touch, o/w seems to not bother him.  No fevers.  Past Medical History:  Diagnosis Date  . Actinic keratoses   . Adrenal hemorrhage ([place]) 2001   s/p fall from ladder--a f/u CT abd showed resolution of the hemorrhage- (duodenal polyp incidentally noted;  f/u EGD by Dr. Watt Climes 03/2000 showed small lipomatous mass in duodenum which was confirmed by biopsy.  . Alzheimer's dementia ([place])    with behav disturbance and apathy.  [facility] referral as of 07/2013.  They added namenda 07/2014 due to pt's signif decline in the prior 6 mo.  . Arthralgia of knee, right 2009   exam suspicious for meniscal origin  . BPH (benign prostatic hypertrophy)    History of acute urinary retention (2006)  Hx of elevated PSA (7.26) + right sided prostate nodule on DRE by urologist.  PSA did not go down with tx with antibiotics--biopsy was done and showed NO MALIGNANCY but some chronic inflammation was present--turned out related to acute cystitis (enterococcus UTI).  PSA was 1.17 in 07/2011 at urologist's (Dr. Karsten Ro).  F/u at urol is now prn as of 08/2014.  Marland Kitchen BPPV  (benign paroxysmal positional vertigo)   . DDD (degenerative disc disease), cervical    C5-6, C6-7 primarily  . Headache in back of head    Neurologist treated him with PT for this problem and it helped  . Hearing deficit    Bilateral; noise damage in [employer]  Has had hearing aids since about 2008.  Marland Kitchen Herpes zoster 07/25/2018   suspected--valtrex rx'd.  Marland Kitchen History of acute cystitis 08/2009   with acute prostatitis (enterococcus faecalis)  . History of solitary pulmonary nodule    Initially noted on CT abd for his adrenal hemorrhage s/p fall from ladder 2001.  F/u CT chest 04/2000 showed that it had shrunk from 69mm to 25mm and had no malignant features.  . History of TIA (transient ischemic attack) @ 2008   02/2001 and 01/2006 carotid dopplers showed 50% or less stenosis.  . Left bundle branch hemiblock    mentioned in dx of old records 04/15/2009  . Migraine headache with aura onset in teens   No migraines since about 2004  . Skin cancer     Past Surgical History:  Procedure Laterality Date  . Carotid dopplers  05/2016   1-39% stenosis bilat ICA.  . cataract extrac    . dental     implants  . PROSTATE BIOPSY  04/07/2005   For elevated PSA + right sided prostate nodule on DRE  by urologist:  benign biopsy.  . TONSILLECTOMY  preteen  . TRANSTHORACIC ECHOCARDIOGRAM  06/28/2016   EF 60-65%, mild dilatation of ascending aorta.    Outpatient Medications Prior to Visit  Medication Sig Dispense Refill  . aspirin 325 MG EC tablet Take 1 tablet (325 mg total) by mouth daily. 90 tablet 3  . risperiDONE (RISPERDAL) 0.5 MG tablet Take 0.125-0.25 mg by mouth See admin instructions. 1/4 tablet in am and 1/2 tablet pm    . rivastigmine (EXELON) 13.3 MG/24HR Place 13.3 mg onto the skin daily.    . tamsulosin (FLOMAX) 0.4 MG CAPS capsule Take 2 capsules (0.8 mg total) by mouth daily after supper. (Patient taking differently: Take 0.4 mg by mouth daily after supper. ) 180 capsule 3  .  valACYclovir (VALTREX) 1000 MG tablet Take 1 tablet (1,000 mg total) by mouth 2 (two) times daily. (Patient not taking: Reported on 08/02/2018) 14 tablet 0   No facility-administered medications prior to visit.     No Known Allergies  ROS As per HPI  PE: Blood pressure 123/81, pulse 69, temperature (!) 97.4 F (36.3 C), temperature source Oral, resp. rate 16, height 5\' 7"  (1.702 m), weight 147 lb 9.6 oz (67 kg), SpO2 99 %. Gen: Alert, well appearing.  Patient is oriented to person and general situation only. NAD. Bilat LL's w/loss of subQ fat, and extensive venous anatomy is appreciated, with a few varicosities and spider veins but no areas of inflammation or skin breakdown.  No edema or focal STS. General violaceous hue to skin of ankles and feet, +blanchable.  Warm feet/toes, DP and PT pulses 2+ bilat. GU: mild erythematous macular rash in anterior area as well as perineum and interglut region.  No maceration. No papules or pustules or vesicles. Dry nodular skin lesion about 1 cm wide and 1 cm tall on back of R calf. L lumbar and L flank with drying, erythematous papular rash c/w healing shingles.  LABS:  None today.  IMPRESSION AND PLAN:  1) Intertrigo/diaper dermatitis: wife taking care of this well with otc zinc oxide, no sign of infection. Continue liberal application of this. Signs/symptoms to call or return for were reviewed and pt expressed understanding.  2) Nodular skin lesion: suspect pyodermic granuloma vs cyst. Watchful waiting.  3) Shingles-resolving.  4) Skin fragility: wife may decrease ASA to 81mg  qd if she wants.   An After Visit Summary was printed and given to the patient.  FOLLOW UP: Return for as needed.  Signed:  Crissie Sickles, MD           08/02/2018

## 2018-09-01 ENCOUNTER — Ambulatory Visit: Payer: Self-pay

## 2018-09-01 NOTE — Telephone Encounter (Signed)
Tried contacting pt's wife to schedule for sooner appt but no answer. LMCTB

## 2018-09-01 NOTE — Telephone Encounter (Addendum)
SW pt's wife, Deatra James and asked if he would be able to wait until appt Monday or has any other symptoms. She was unsure. Advised to go to Urgent Care or ER if things get worse overnight and can't make it to the office.  LMCTB for sooner appt, preferably tomorrow.

## 2018-09-01 NOTE — Telephone Encounter (Signed)
Virtual visit has been scheduled for Monday 09/04/18.

## 2018-09-01 NOTE — Telephone Encounter (Signed)
Patient's wife called and says when he urinated today, she wiped his penis and it was blood coming out of it. She says his urine was not bloody, but it was a darker yellow and had a slight odor to it. She says no fever. She says earlier in the week, his urine was darker in color, so she increased his water intake. She says his urine is less during the day and soaks the bed at night. I called the office and spoke to [place], she asked to speak to the wife, call transferred successfully.   Reason for Disposition . Pus (white, yellow) or bloody discharge from end of penis  Answer Assessment - Initial Assessment Questions 1. SYMPTOM: "What's the main symptom you're concerned about?" (e.g., discharge from penis, rash, pain, itching, swelling)     Bloody when wiped after urinating 2. LOCATION: "Where is the blood located?"     Coming out the penis 3. ONSET: "When did blood start?"     Today 4. PAIN: "Is there any pain?" If so, ask: "How bad is it?"  (Scale 1-10; or mild, moderate, severe)     Not complain 5. URINE: "Any difficulty passing urine?" If so, ask: "When was the last time?"     Not able to tell, but it's less than usual during the day, but at night he soaks the bed 6. CAUSE: "What do you think is causing the symptoms?"     I don't know 7. OTHER SYMPTOMS: "Do you have any other symptoms?" (e.g., fever, abdominal pain, blood in urine)     Urine smells a little bit, urine darker yellow than usual  Protocols used: PENIS AND SCROTUM [facility]

## 2018-09-02 ENCOUNTER — Ambulatory Visit (INDEPENDENT_AMBULATORY_CARE_PROVIDER_SITE_OTHER): Payer: Medicare Other | Admitting: Family Medicine

## 2018-09-02 ENCOUNTER — Encounter: Payer: Self-pay | Admitting: Family Medicine

## 2018-09-02 ENCOUNTER — Other Ambulatory Visit: Payer: Self-pay

## 2018-09-02 VITALS — BP 92/55 | HR 69 | Temp 98.0°F | Ht 67.0 in

## 2018-09-02 DIAGNOSIS — N368 Other specified disorders of urethra: Secondary | ICD-10-CM | POA: Diagnosis not present

## 2018-09-02 MED ORDER — CEPHALEXIN 500 MG PO CAPS: 500.0000 mg | ORAL_CAPSULE | Freq: Two times a day (BID) | ORAL | 0 refills | Status: DC

## 2018-09-02 NOTE — Telephone Encounter (Signed)
Pt scheduled for phone visit on Sat 09/02/18 with Dr Raoul Pitch

## 2018-09-02 NOTE — Progress Notes (Signed)
Virtual Visit via Video   I connected with Mitchell Abbott on 09/02/18 at 10:30 AM EDT by a video enabled telemedicine application and verified that I am speaking with the correct person using two identifiers. Location patient: Home Location provider: [facility], Office Persons participating in the virtual visit: Patient, Dr. Raoul Pitch and R.Baker, LPN  I discussed the limitations of evaluation and management by telemedicine and the availability of in person appointments. The patient expressed understanding and agreed to proceed.  Subjective:   Chief Complaint  Patient presents with  . Blood on penis     Pts wife states there was blood on the end of patients penis yesterday, bright red in color. Darker color urine, looks like apple juice per wife. Odor has changed and she states it  does smell different. Denies fever.     HPI:  Patient presents today with his wife however to visit to discuss bleeding of the urethra.  His wife reports that yesterday she noticed some bleeding from his urethra after he urinated.  She wiped off the blood from the urethra and then more started trickling out.  She said overall less than a teaspoon of blood was noted.  He has not had any bleeding since.  He has not had anything like that in the past.  Reports his urine looks darker today and has an odor to it.  She reports he did not seem to be in any discomfort although that is difficult to gauge with his dementia.  Denies any fever, chills, nausea or vomiting.  She is attempting to push hydration on him, but this has always been a struggle.  He has had no recent catheterization, but has seen a urologist in January for his BPH.  He is taken Flomax 0.5 mg daily after supper.  ROS: See pertinent positives and negatives per HPI.  Patient Active Problem List   Diagnosis Date Noted  . Rash 07/25/2018  . Syncope 06/27/2016  . Maxillary sinusitis 05/27/2015  . Allergic rhinitis 01/20/2015  . Bacterial  conjunctivitis of left eye 11/05/2014  . Chronic renal insufficiency, stage II (mild) 09/03/2013  . Observation for suspected malignant neoplasm 09/03/2013  . Preventative health care 09/03/2013  . Orthostatic syncope 02/05/2013  . Mild cognitive impairment 10/20/2012  . Cervical myofascial strain 08/15/2012  . Sacroiliac joint pain 02/18/2012  . History of TIA (transient ischemic attack)   . BPH (benign prostatic hypertrophy) 10/13/2011  . Headache disorder 10/13/2011  . Dementia ([place]) 10/13/2011    Social History   Tobacco Use  . Smoking status: Former Smoker    Years: 2.00    Types: Cigarettes  . Smokeless tobacco: Never Used  Substance Use Topics  . Alcohol use: Yes    Alcohol/week: 1.0 standard drinks    Types: 1 Cans of beer per week    Comment: "a good drink every day" BEER/WINE    Current Outpatient Medications:  .  aspirin 325 MG EC tablet, Take 1 tablet (325 mg total) by mouth daily., Disp: 90 tablet, Rfl: 3 .  cephALEXin (KEFLEX) 500 MG capsule, Take 1 capsule (500 mg total) by mouth 2 (two) times daily., Disp: 14 capsule, Rfl: 0 .  clotrimazole (LOTRIMIN) 1 % external solution, , Disp: , Rfl:  .  mupirocin ointment (BACTROBAN) 2 %, , Disp: , Rfl:  .  risperiDONE (RISPERDAL) 0.5 MG tablet, Take 0.125-0.25 mg by mouth See admin instructions. 1/4 tablet in am and 1/2 tablet pm, Disp: , Rfl:  .  rivastigmine (EXELON) 13.3 MG/24HR, Place 13.3 mg onto the skin daily., Disp: , Rfl:  .  tamsulosin (FLOMAX) 0.4 MG CAPS capsule, Take 2 capsules (0.8 mg total) by mouth daily after supper. (Patient taking differently: Take 0.4 mg by mouth daily after supper. ), Disp: 180 capsule, Rfl: 3 .  THERATEARS 0.25 % SOLN, , Disp: , Rfl:   No Known Allergies  Objective:  BP (!) 92/55   Pulse 69   Temp 98 F (36.7 C) (Oral)   Ht 5\' 7"  (1.702 m)   BMI 23.12 kg/m  Gen: No acute distress. Nontoxic in appearance.  HENT: AT. [place].   Eyes: Conjunctiva without redness, discharge or  icterus. Chest: Cough or shortness of breath not present. Skin: no rashes, purpura or petechiae.  Neuro: Alert.  Patient has severe dementia, but he is able to say hi when he sees me on them screen  Assessment and Plan:  Mitchell Abbott is a 85 y.o. male Urethral bleeding - X1 Episode of urethral bleeding.  No obvious signs and symptoms of infection, however patient does have rather significant dementia making it difficult to communicate any pain. -discussed with his wife that urethral bleeding can be from injury, catheter use, infection or if even passed a small kidney stone (which he does not have history of). -Encouraged her to push the fluids, make sure he is drinking plenty of water.  Monitor for any signs of infection such as fever or nausea or vomit. -Does not sound like he has anything obstructing his urine, he is having urine output.  Monitor for any decreased urine output, if occurs she needs to get him in to be seen urgently at an urgent care or emergency room over the weekend. -Elected to start Keflex 500 mg twice daily prophylactically treat, or otherwise prevent infection with irritation of his urethra. -Follow-up with PCP in 3 to 4 days.   > 25 minutes spent with patient, >50% of time spent face to face counseling   Howard Pouch, DO 09/02/2018

## 2018-09-02 NOTE — Patient Instructions (Addendum)
Prescribed Keflex 500 mg twice daily. Follow-up with his PCP next week. push the fluids, make sure he is drinking plenty of water.  Monitor for any signs of infection such as fever or nausea or vomit.  Monitor for any decreased urine output, if occurs  get him in to be seen urgently at an urgent care or emergency room over the weekend.

## 2018-09-04 ENCOUNTER — Ambulatory Visit: Payer: Medicare Other | Admitting: Family Medicine

## 2018-10-25 ENCOUNTER — Telehealth: Payer: Self-pay | Admitting: Family Medicine

## 2018-10-25 NOTE — Telephone Encounter (Signed)
SW pt's wife regarding concerns. His feet look more pinkish, very swollen and cold. He still has an appetite. Overall he is declining movement and does not want to do much.   Please advise, if any other suggestions.

## 2018-10-25 NOTE — Telephone Encounter (Signed)
LMTCB to offer in office visit for early afternoon tomorrow.

## 2018-10-25 NOTE — Telephone Encounter (Signed)
Wife called in regarding husband. Verified PHI. States that both feet are swollen and his physical health has deterated within the last few days and very concerned for him. Patient is 85 years old  Please call wife [phone]

## 2018-10-25 NOTE — Telephone Encounter (Signed)
Schedule in-office visit.-thx

## 2018-10-26 NOTE — Telephone Encounter (Signed)
lmom to call office. I have scheduled an appt tomorrow at 1pm, screening must be completed.

## 2018-10-27 ENCOUNTER — Other Ambulatory Visit: Payer: Self-pay | Admitting: Family Medicine

## 2018-10-27 ENCOUNTER — Other Ambulatory Visit: Payer: Self-pay

## 2018-10-27 ENCOUNTER — Ambulatory Visit (INDEPENDENT_AMBULATORY_CARE_PROVIDER_SITE_OTHER): Payer: Medicare Other | Admitting: Family Medicine

## 2018-10-27 ENCOUNTER — Encounter: Payer: Self-pay | Admitting: Family Medicine

## 2018-10-27 VITALS — BP 128/81 | HR 55 | Temp 97.3°F | Resp 16 | Ht 67.0 in | Wt 148.2 lb

## 2018-10-27 DIAGNOSIS — I872 Venous insufficiency (chronic) (peripheral): Secondary | ICD-10-CM

## 2018-10-27 DIAGNOSIS — R627 Adult failure to thrive: Secondary | ICD-10-CM

## 2018-10-27 DIAGNOSIS — F0391 Unspecified dementia with behavioral disturbance: Secondary | ICD-10-CM | POA: Diagnosis not present

## 2018-10-27 MED ORDER — FUROSEMIDE 20 MG PO TABS: ORAL_TABLET | ORAL | 1 refills | Status: DC

## 2018-10-27 NOTE — Progress Notes (Addendum)
OFFICE VISIT  10/27/2018   CC:  Chief Complaint  Patient presents with  . Edema    feet, cold   HPI:    Patient is a 85 y.o. Caucasian male who presents accompanied by his wife for recent decline in baseline health status. He has severe dementia with behavioral disturbance, lives at home with his wife, and is normally of good health for his age. He does have some BPH and has been meds for lower urinary tract obstructive sx's, and has also had acute prostatitis/cystitis in the past.  Feet swelling noted about 5-7d ago, gradual onset.  No medication change lately. No fevers.  No SOB.    Over the last 3 mo his wife has noted general decline in baseline health status.  Overall He is less active.  Less mentally alert/attentive. Sleeping more than what is typical.  He no longer goes to adult daycare the last few months due to covid crisis restrictions. He uses walker some still, never walks alone.  Using wheelchair more lately.  ROS: no CP, no SOB, no wheezing, no cough, no dizziness, no HAs, no rashes, no melena/hematochezia. No myalgias or arthralgias.  Past Medical History:  Diagnosis Date  . Actinic keratoses   . Adrenal hemorrhage ([place]) 2001   s/p fall from ladder--a f/u CT abd showed resolution of the hemorrhage- (duodenal polyp incidentally noted;  f/u EGD by Dr. Watt Climes 03/2000 showed small lipomatous mass in duodenum which was confirmed by biopsy.  . Alzheimer's dementia ([place])    with behav disturbance and apathy.  [facility] referral as of [facility].  They added namenda 07/2014 due to pt's signif decline in the prior 6 mo.  . Arthralgia of knee, right 2009   exam suspicious for meniscal origin  . BPH (benign prostatic hypertrophy)    History of acute urinary retention (2006)  Hx of elevated PSA (7.26) + right sided prostate nodule on DRE by urologist.  PSA did not go down with tx with antibiotics--biopsy was done and showed NO MALIGNANCY but some chronic inflammation was  present--turned out related to acute cystitis (enterococcus UTI).  PSA was 1.17 in 07/2011 at urologist's (Dr. Karsten Ro).  F/u at urol is now prn as of 08/2014.  Marland Kitchen BPPV (benign paroxysmal positional vertigo)   . DDD (degenerative disc disease), cervical    C5-6, C6-7 primarily  . Headache in back of head    Neurologist treated him with PT for this problem and it helped  . Hearing deficit    Bilateral; noise damage in [employer].  Has had hearing aids since about 2008.  Marland Kitchen Herpes zoster 07/25/2018   suspected--valtrex rx'd.  Marland Kitchen History of acute cystitis 08/2009   with acute prostatitis (enterococcus faecalis)  . History of solitary pulmonary nodule    Initially noted on CT abd for his adrenal hemorrhage s/p fall from ladder 2001.  F/u CT chest 04/2000 showed that it had shrunk from 90mm to 56mm and had no malignant features.  . History of TIA (transient ischemic attack) @ 2008   02/2001 and 01/2006 carotid dopplers showed 50% or less stenosis.  . Left bundle branch hemiblock    mentioned in dx of old records 04/15/2009  . Migraine headache with aura onset in teens   No migraines since about 2004  . Skin cancer     Past Surgical History:  Procedure Laterality Date  . Carotid dopplers  05/2016   1-39% stenosis bilat ICA.  . cataract extrac    .  dental     implants  . PROSTATE BIOPSY  04/07/2005   For elevated PSA + right sided prostate nodule on DRE by urologist:  benign biopsy.  . TONSILLECTOMY  preteen  . TRANSTHORACIC ECHOCARDIOGRAM  06/28/2016   EF 60-65%, mild dilatation of ascending aorta.    Outpatient Medications Prior to Visit  Medication Sig Dispense Refill  . aspirin 325 MG EC tablet Take 1 tablet (325 mg total) by mouth daily. (Patient taking differently: Take 325 mg by mouth daily. Pt takes 1/2 tab of 325mg .) 90 tablet 3  . clotrimazole (LOTRIMIN) 1 % external solution     . mupirocin ointment (BACTROBAN) 2 %     . risperiDONE (RISPERDAL) 0.5 MG tablet Take 0.125-0.25 mg by  mouth See admin instructions. 1/4 tablet in am and 1/2 tablet pm    . rivastigmine (EXELON) 13.3 MG/24HR Place 13.3 mg onto the skin daily.    . tamsulosin (FLOMAX) 0.4 MG CAPS capsule Take 2 capsules (0.8 mg total) by mouth daily after supper. (Patient taking differently: Take 0.4 mg by mouth daily after supper. ) 180 capsule 3  . THERATEARS 0.25 % SOLN     . cephALEXin (KEFLEX) 500 MG capsule Take 1 capsule (500 mg total) by mouth 2 (two) times daily. (Patient not taking: Reported on 10/27/2018) 14 capsule 0   No facility-administered medications prior to visit.     No Known Allergies  ROS As per HPI  PE: Blood pressure 128/81, pulse (!) 55, temperature (!) 97.3 F (36.3 C), temperature source Oral, resp. rate 16, height 5\' 7"  (1.702 m), weight 148 lb 3.2 oz (67.2 kg), SpO2 98 %. Gen: poorly attentive, opens eyes to my voice, follows his wife's 1 step commands.  NAD. CV: RRR, no m/r/g.   LUNGS: CTA bilat, nonlabored resps, good aeration in all lung fields. ABD: soft, ND/ND EXT: bilat LL 2+ pitting edema from mid tibia level down into feet.  No skin breakdown. He has some diffuse hemosiderin changes in skin but no fibrosis.  He has fairly extensive varicosities that are mild/mod in size, +spider veins (no inflammation noted). Violaceous hue to both feet.  DP and PT pulses 2+ bilat.  No ulcerations.  Feet cool to touch. Moves feet and toes w/out problem.  Toenails hypertrophic/mycotic bilat.   LABS:    Chemistry      Component Value Date/Time   NA 139 06/28/2018 1151   K 4.8 06/28/2018 1151   CL 106 06/28/2018 1151   CO2 24 06/28/2018 1151   BUN 18 06/28/2018 1151   CREATININE 1.05 06/28/2018 1151   CREATININE 1.02 09/05/2015 1528      Component Value Date/Time   CALCIUM 9.1 06/28/2018 1151   ALKPHOS 64 06/28/2018 1151   AST 22 06/28/2018 1151   ALT 16 06/28/2018 1151   BILITOT 0.7 06/28/2018 1151     Lab Results  Component Value Date   WBC 7.1 06/28/2018   HGB 13.8  06/28/2018   HCT 43.6 06/28/2018   MCV 101.4 (H) 06/28/2018   PLT 293 06/28/2018    IMPRESSION AND PLAN:  1) Chronic lower extremity venous insufficiency edema, worsened the last 1 week. Reviewed general treatments: elevate legs above level of the heart 20 min prn. Low sodium intake. Start compression stockings qd--rx given today.  Wife has some at home she'll try first before trying to get some from medical supply store. Lasix 20mg  qd prn IF he has increased swelling above what he has presently for  a day or so. Therapeutic expectations and side effect profile of medication discussed today.  Patient's questions answered.  2) Failure to thrive secondary to progressing dementia. Wife is very good caregiver and she is doing everything she can. He is comfortable.  An After Visit Summary was printed and given to the patient.  FOLLOW UP: Return for keep appt already set for 11/17/18.  Signed:  Crissie Sickles, MD           10/27/2018

## 2018-10-30 NOTE — Telephone Encounter (Signed)
Insurance requesting 90 day supply for patient's furosemide, Please advise.

## 2018-11-07 DIAGNOSIS — Z20828 Contact with and (suspected) exposure to other viral communicable diseases: Secondary | ICD-10-CM | POA: Diagnosis not present

## 2018-11-14 ENCOUNTER — Telehealth: Payer: Self-pay | Admitting: Family Medicine

## 2018-11-14 NOTE — Telephone Encounter (Signed)
Copied from [place] [id number]. Topic: Appointment Scheduling - Scheduling Inquiry for Clinic >> Nov 14, 2018 11:38 AM Mathis Bud wrote: Reason for CRM:  patient wife called questioning if patient absolutely needs to come to appt 6/19.  Patient wife said he was just in last week and wants to know if the appt is necessary. She is requesting a call back.  Call back # [phone]   Dr Anitra Lauth- please advise on follow up.

## 2018-11-15 ENCOUNTER — Telehealth: Payer: Self-pay | Admitting: Family Medicine

## 2018-11-15 NOTE — Telephone Encounter (Signed)
Noted (rx she means is most likely the lasix)

## 2018-11-15 NOTE — Telephone Encounter (Signed)
Patient scheduled 05/10/19.

## 2018-11-15 NOTE — Telephone Encounter (Signed)
He can reschedule 30 min in-office follow up 6 mo from now.-thx

## 2018-11-15 NOTE — Telephone Encounter (Signed)
FYI  Please see below

## 2018-11-15 NOTE — Telephone Encounter (Signed)
Patient wanted to let Dr. Anitra Lauth know that she followed his suggestions for treating her husband for "puffy feet" without medication and it has worked. She did not get the Rx that was prescribed.

## 2018-11-17 ENCOUNTER — Ambulatory Visit: Payer: Medicare Other | Admitting: Family Medicine

## 2018-12-11 ENCOUNTER — Telehealth: Payer: Self-pay

## 2018-12-11 NOTE — Telephone Encounter (Signed)
Yes, ok to be seen in office.-thx

## 2018-12-11 NOTE — Telephone Encounter (Signed)
Copied from [place] [id number]. Topic: Appointment Scheduling - Scheduling Inquiry for Clinic >> Dec 11, 2018 10:26 AM Edmonia Caprio wrote: Reason for CRM: Patient wife calling to schedule appt with Dr. Anitra Lauth. She states that patient overnight screamed out and is hitting his head (temple area). She is guessing maybe he has a headache. She is unsure. She also states that patient is holding his neck like it is hurting. She is requesting to bring patient in. Denies any fever, or other symptoms. States it is hard for patient to communicate/explain what he is feeling.  Please advise   Please advise, thanks.

## 2018-12-11 NOTE — Telephone Encounter (Signed)
Patient's wife called back. Advised that Dr. Anitra Lauth does not have any openings. She did take patient to day care this morning since he has no fever or any other symptoms. I advised her that we would call back after Dr. Anitra Lauth was through seeing patients today. Advised her to take him to ER if he seemed to be in a lot of pain.

## 2018-12-12 NOTE — Telephone Encounter (Signed)
Please schedule for in office visit.

## 2018-12-12 NOTE — Telephone Encounter (Signed)
Patient is scheduled for 12/13/18 3:30

## 2018-12-13 ENCOUNTER — Other Ambulatory Visit: Payer: Self-pay

## 2018-12-13 ENCOUNTER — Encounter: Payer: Self-pay | Admitting: Family Medicine

## 2018-12-13 ENCOUNTER — Ambulatory Visit (INDEPENDENT_AMBULATORY_CARE_PROVIDER_SITE_OTHER): Payer: Medicare Other | Admitting: Family Medicine

## 2018-12-13 VITALS — BP 100/60 | HR 60 | Temp 98.2°F | Resp 16 | Ht 67.0 in | Wt 141.4 lb

## 2018-12-13 DIAGNOSIS — F0391 Unspecified dementia with behavioral disturbance: Secondary | ICD-10-CM | POA: Diagnosis not present

## 2018-12-13 DIAGNOSIS — R21 Rash and other nonspecific skin eruption: Secondary | ICD-10-CM | POA: Diagnosis not present

## 2018-12-13 DIAGNOSIS — B354 Tinea corporis: Secondary | ICD-10-CM | POA: Diagnosis not present

## 2018-12-13 NOTE — Patient Instructions (Signed)
Buy over the counter lamisil (generic or store brand) and apply to the rash on his back twice per day. This may take up to 6 weeks to totally go away!  You can try giving Mitchell Abbott 1000 mg tylenol three times per day on the days that he seems to be acting like he is having a headache.

## 2018-12-13 NOTE — Progress Notes (Signed)
OFFICE VISIT  12/13/2018   CC:  Chief Complaint  Patient presents with  . Headache    x 10 days    HPI:    Patient is a 85 y.o. Caucasian male with severe dementia with behavioral disturbance who presents accompanied by his wife [name] for suspicion of headache.  He doesn't communicate well at all anymore with words/speech. He has been "hitting his head" lately and pt's wife interprets this as possibly his reaction to his head hurting. No known trauma to head.  Started doing it last week about 10 days ago.  He screamed out in his sleep recently, was hard to calm down when woke up. He was breathing so hard.  Was rubbing hands around head a lot, wife asked him if he was in pain. Still having nights where he has no screaming out.  Has days when he acts his normal self, eating fine, no behavior that would suggest he is in pain. He is sleepy during the day--this is usual for him, esp if he has nothing to do or keep his attention.  Of note, he does have hx of occipital HAs that were believed to be cervicogenic and he got PT for neck and it helped the HAs.  Also a very remote hx of migraines (none since 15 yrs ago approx).  A MRI w/out contrast of head was done 08/12/11 for memory loss and headaches. IMPRESSION:  1.  Extensive white matter disease. The finding is nonspecific but can be seen in the setting of chronic microvascular ischemia, a demyelinating process such as multiple sclerosis, vasculitis, complicated migraine headaches, or as the sequelae of a prior infectious or inflammatory process. 2.  No acute or focal intracranial abnormality to explain the patients symptoms.  ROS: no fevers.  No n/v.   He has a splotch of reddish rash on back for last couple weeks, wife has not applied anything.  Past Medical History:  Diagnosis Date  . Actinic keratoses   . Adrenal hemorrhage ([place]) 2001   s/p fall from ladder--a f/u CT abd showed resolution of the hemorrhage- (duodenal polyp  incidentally noted;  f/u EGD by Dr. Watt Climes 03/2000 showed small lipomatous mass in duodenum which was confirmed by biopsy.  . Alzheimer's dementia ([place])    with behav disturbance and apathy.  [facility] referral as of 07/[facility].  They added namenda 07/2014 due to pt's signif decline in the prior 6 mo.  . Arthralgia of knee, right 2009   exam suspicious for meniscal origin  . BPH (benign prostatic hypertrophy)    History of acute urinary retention (2006)  Hx of elevated PSA (7.26) + right sided prostate nodule on DRE by urologist.  PSA did not go down with tx with antibiotics--biopsy was done and showed NO MALIGNANCY but some chronic inflammation was present--turned out related to acute cystitis (enterococcus UTI).  PSA was 1.17 in 07/2011 at urologist's (Dr. Karsten Ro).  F/u at urol is now prn as of 08/2014.  Marland Kitchen BPPV (benign paroxysmal positional vertigo)   . DDD (degenerative disc disease), cervical    C5-6, C6-7 primarily  . Headache in back of head    Neurologist treated him with PT for this problem and it helped  . Hearing deficit    Bilateral; noise damage in [employer].  Has had hearing aids since about 2008.  Marland Kitchen Herpes zoster 07/25/2018   suspected--valtrex rx'd.  Marland Kitchen History of acute cystitis 08/2009   with acute prostatitis (enterococcus faecalis)  . History of  solitary pulmonary nodule    Initially noted on CT abd for his adrenal hemorrhage s/p fall from ladder 2001.  F/u CT chest 04/2000 showed that it had shrunk from 72mm to 66mm and had no malignant features.  . History of TIA (transient ischemic attack) @ 2008   02/2001 and 01/2006 carotid dopplers showed 50% or less stenosis.  . Left bundle branch hemiblock    mentioned in dx of old records 04/15/2009  . Migraine headache with aura onset in teens   No migraines since about 2004  . Skin cancer     Past Surgical History:  Procedure Laterality Date  . Carotid dopplers  05/2016   1-39% stenosis bilat ICA.  . cataract extrac    .  dental     implants  . PROSTATE BIOPSY  04/07/2005   For elevated PSA + right sided prostate nodule on DRE by urologist:  benign biopsy.  . TONSILLECTOMY  preteen  . TRANSTHORACIC ECHOCARDIOGRAM  06/28/2016   EF 60-65%, mild dilatation of ascending aorta.    Outpatient Medications Prior to Visit  Medication Sig Dispense Refill  . aspirin 325 MG EC tablet Take 1 tablet (325 mg total) by mouth daily. (Patient taking differently: Take 325 mg by mouth daily. Pt takes 1/2 tab of 325mg .) 90 tablet 3  . clotrimazole (LOTRIMIN) 1 % external solution     . furosemide (LASIX) 20 MG tablet TAKE 1 TABLET BY MOUTH EVERY DAY AS NEEDED FOR WORSENING OF EXTREMITY SWELLING 90 tablet 1  . mupirocin ointment (BACTROBAN) 2 %     . risperiDONE (RISPERDAL) 0.5 MG tablet Take 0.125-0.25 mg by mouth See admin instructions. 1/4 tablet in am and 1/2 tablet pm    . rivastigmine (EXELON) 13.3 MG/24HR Place 13.3 mg onto the skin daily.    . tamsulosin (FLOMAX) 0.4 MG CAPS capsule Take 2 capsules (0.8 mg total) by mouth daily after supper. (Patient taking differently: Take 0.4 mg by mouth daily after supper. ) 180 capsule 3  . THERATEARS 0.25 % SOLN      No facility-administered medications prior to visit.     No Known Allergies  ROS As per HPI  PE: Blood pressure 100/60, pulse 60, temperature 98.2 F (36.8 C), temperature source Temporal, resp. rate 16, height 5\' 7"  (1.702 m), weight 141 lb 6.4 oz (64.1 kg), SpO2 99 %. Body mass index is 22.15 kg/m.  Gen: NAD.  Answers questions consistently usually with head movements or hand gestures, follows 2 step commands.  He is calm.  Becomes inattentive and puts head down and closes his eyes when not being spoken to directly, as is his usual behavior. Neuro: CN 2-12 intact bilaterally, strength 5/5 in proximal and distal upper extremities and lower extremities bilaterally.    No tremor.  FNF normal bilat.  No ataxia.  Walks in small, slow steps as per his usual.  No  pronator drift. Skin: left upper back region with baseball sized region of pinkish macular rash in round shape with some superficial desquamation.  No central clearing.  No vesicles, no petechiae, no bullae, no papules, no skin breakdown.  LABS:    Chemistry      Component Value Date/Time   NA 139 06/28/2018 1151   K 4.8 06/28/2018 1151   CL 106 06/28/2018 1151   CO2 24 06/28/2018 1151   BUN 18 06/28/2018 1151   CREATININE 1.05 06/28/2018 1151   CREATININE 1.02 09/05/2015 1528      Component Value Date/Time  CALCIUM 9.1 06/28/2018 1151   ALKPHOS 64 06/28/2018 1151   AST 22 06/28/2018 1151   ALT 16 06/28/2018 1151   BILITOT 0.7 06/28/2018 1151      IMPRESSION AND PLAN:  1) Hitting head intermittently, screaming in sleep--new onset but sporadic in occurrence.. Suspect this is behavior consistent with his advanced dementia progressing. Impossible to determine if any pain is actually occurring.  Nonfocal neuro exam. Reassured [name] that no imaging or testing is needed. May try using tylenol 1000 mg tid on days he is acting like he has pain to see if this helps.  2) Rash on back: likely tinea. Trial of otc lamisil cream bid recommended.  Spent 30 min with pt today, with >50% of this time spent in counseling and care coordination regarding the above problems.  An After Visit Summary was printed and given to the patient.  FOLLOW UP: Return if symptoms worsen or fail to improve.  Signed:  Crissie Sickles, MD           12/13/2018

## 2018-12-20 ENCOUNTER — Telehealth: Payer: Self-pay | Admitting: Family Medicine

## 2018-12-20 NOTE — Telephone Encounter (Signed)
Patient's wife states the patient's husband is still screaming while holding his neck. Tylenol is sometimes helping for a short period of time however it is difficult to tell since patient is non-verbal. The pain does keep returning. Can he get a referral?

## 2018-12-20 NOTE — Telephone Encounter (Signed)
Please advise, thanks.

## 2018-12-21 ENCOUNTER — Other Ambulatory Visit: Payer: Self-pay

## 2018-12-21 ENCOUNTER — Other Ambulatory Visit: Payer: Self-pay | Admitting: Family Medicine

## 2018-12-21 ENCOUNTER — Ambulatory Visit (HOSPITAL_BASED_OUTPATIENT_CLINIC_OR_DEPARTMENT_OTHER)
Admission: RE | Admit: 2018-12-21 | Discharge: 2018-12-21 | Disposition: A | Discharge status: Home / Self Care | Payer: Medicare Other | Source: Ambulatory Visit | Attending: Family Medicine | Admitting: Family Medicine

## 2018-12-21 DIAGNOSIS — M542 Cervicalgia: Secondary | ICD-10-CM

## 2018-12-21 NOTE — Telephone Encounter (Signed)
Pt's wife, Deatra James advised regarding neck XR and given location.

## 2018-12-21 NOTE — Telephone Encounter (Signed)
Lets start with some neck x-rays first. I'll order them for [facility].-thx

## 2018-12-25 ENCOUNTER — Other Ambulatory Visit: Payer: Self-pay | Admitting: Family Medicine

## 2018-12-25 DIAGNOSIS — G8929 Other chronic pain: Secondary | ICD-10-CM

## 2019-01-01 DIAGNOSIS — S134XXA Sprain of ligaments of cervical spine, initial encounter: Secondary | ICD-10-CM | POA: Diagnosis not present

## 2019-01-09 DIAGNOSIS — S134XXA Sprain of ligaments of cervical spine, initial encounter: Secondary | ICD-10-CM | POA: Diagnosis not present

## 2019-01-18 DIAGNOSIS — S134XXA Sprain of ligaments of cervical spine, initial encounter: Secondary | ICD-10-CM | POA: Diagnosis not present

## 2019-01-25 DIAGNOSIS — S134XXA Sprain of ligaments of cervical spine, initial encounter: Secondary | ICD-10-CM | POA: Diagnosis not present

## 2019-01-30 DIAGNOSIS — S134XXA Sprain of ligaments of cervical spine, initial encounter: Secondary | ICD-10-CM | POA: Diagnosis not present

## 2019-02-08 DIAGNOSIS — S134XXA Sprain of ligaments of cervical spine, initial encounter: Secondary | ICD-10-CM | POA: Diagnosis not present

## 2019-03-08 ENCOUNTER — Other Ambulatory Visit: Payer: Self-pay

## 2019-03-08 ENCOUNTER — Ambulatory Visit (INDEPENDENT_AMBULATORY_CARE_PROVIDER_SITE_OTHER): Payer: Medicare Other

## 2019-03-08 DIAGNOSIS — Z23 Encounter for immunization: Secondary | ICD-10-CM | POA: Diagnosis not present

## 2019-04-23 ENCOUNTER — Other Ambulatory Visit: Payer: Self-pay

## 2019-05-08 ENCOUNTER — Other Ambulatory Visit: Payer: Self-pay

## 2019-05-10 ENCOUNTER — Other Ambulatory Visit: Payer: Self-pay

## 2019-05-10 ENCOUNTER — Encounter: Payer: Self-pay | Admitting: Family Medicine

## 2019-05-10 ENCOUNTER — Ambulatory Visit (INDEPENDENT_AMBULATORY_CARE_PROVIDER_SITE_OTHER): Payer: Medicare Other | Admitting: Family Medicine

## 2019-05-10 VITALS — Wt 135.0 lb

## 2019-05-10 DIAGNOSIS — F0391 Unspecified dementia with behavioral disturbance: Secondary | ICD-10-CM | POA: Diagnosis not present

## 2019-05-10 DIAGNOSIS — N401 Enlarged prostate with lower urinary tract symptoms: Secondary | ICD-10-CM | POA: Diagnosis not present

## 2019-05-10 DIAGNOSIS — N138 Other obstructive and reflux uropathy: Secondary | ICD-10-CM

## 2019-05-10 NOTE — Progress Notes (Signed)
Virtual Visit via Video Note  I connected with Mitchell Abbott and Mitchell Abbott on 05/10/19 at  9:30 AM EST by a video enabled telemedicine application and verified that I am speaking with the correct person using two identifiers.  Location patient: home Location provider:work or home office Persons participating in the virtual visit: patient, Mitchell Abbott, and myself.  I discussed the limitations of evaluation and management by telemedicine and the availability of in person appointments. The patient expressed understanding and agreed to proceed.  Telemedicine visit is a necessity given the COVID-19 restrictions in place at the current time.  HPI: 85 y/o WM being seen today for 5 mo f/u advanced dementia with behavioral disturbance PLUS BPH with LUT obstructive sx;s. Last f/u visit all was stable, no changes made.  Interim hx: Declining mentally/psychologically. Recognizes wife and knows she is Mitchell wife. When being touched, like putting cream on Mitchell rash in groin, he gets irritated but not aggressive/violent. He does not initiate any talk with wife or others.   He sometimes will answer questions. He does repetitive chewing whether or not he's eating.  No involuntary motions with tongue or neck. Not having any tremor.   He's eating ok.  Wife is trying to continue having him walk unassisted sometimes. He has a chairlift for use going downstairs.  NO recent falls. Going to adult/senior care center several hours a day since June and he likes it. Urinating: chronic problem with nocturia, no problem with incontinence in daytime.  Goes to bathroom q 2 hrs scheduled.  Decent bowel habits, occ "accidents".  LE swelling improved with conservative measures, no diuretic had to be used.   ROS: See pertinent positives and negatives per HPI.  Past Medical History:  Diagnosis Date  . Actinic keratoses   . Adrenal hemorrhage ([place]) 2001   s/p fall from ladder--a f/u CT abd showed resolution of the  hemorrhage- (duodenal polyp incidentally noted;  f/u EGD by Dr. Watt Climes 03/2000 showed small lipomatous mass in duodenum which was confirmed by biopsy.  . Alzheimer's dementia ([place])    with behav disturbance and apathy.  [facility] referral as of 07/2013.  They added namenda 07/2014 due to Mitchell Abbott's signif decline in the prior 6 mo.  . Arthralgia of knee, right 2009   exam suspicious for meniscal origin  . BPH (benign prostatic hypertrophy)    History of acute urinary retention (2006)  Hx of elevated PSA (7.26) + right sided prostate nodule on DRE by urologist.  PSA did not go down with tx with antibiotics--biopsy was done and showed NO MALIGNANCY but some chronic inflammation was present--turned out related to acute cystitis (enterococcus UTI).  PSA was 1.17 in 07/2011 at urologist's (Dr. Karsten Ro).  F/u at urol is now prn as of 08/2014.  Marland Kitchen BPPV (benign paroxysmal positional vertigo)   . DDD (degenerative disc disease), cervical    C5-6, C6-7 primarily  . Headache in back of head    Neurologist treated him with Mitchell Abbott for this problem and it helped  . Hearing deficit    Bilateral; noise damage in [employer].  Has had hearing aids since about 2008.  Marland Kitchen Herpes zoster 07/25/2018   suspected--valtrex rx'd.  Marland Kitchen History of acute cystitis 08/2009   with acute prostatitis (enterococcus faecalis)  . History of solitary pulmonary nodule    Initially noted on CT abd for Mitchell adrenal hemorrhage s/p fall from ladder 2001.  F/u CT chest 04/2000 showed that it had shrunk from 61mm to  70mm and had no malignant features.  . History of TIA (transient ischemic attack) @ 2008   02/2001 and 01/2006 carotid dopplers showed 50% or less stenosis.  . Left bundle branch hemiblock    mentioned in dx of old records 04/15/2009  . Migraine headache with aura onset in teens   No migraines since about 2004  . Skin cancer     Past Surgical History:  Procedure Laterality Date  . Carotid dopplers  05/2016   1-39% stenosis bilat ICA.   . cataract extrac    . dental     implants  . PROSTATE BIOPSY  04/07/2005   For elevated PSA + right sided prostate nodule on DRE by urologist:  benign biopsy.  . TONSILLECTOMY  preteen  . TRANSTHORACIC ECHOCARDIOGRAM  06/28/2016   EF 60-65%, mild dilatation of ascending aorta.    Family History  Problem Relation Age of Onset  . Sudden death Mother 60       FH info obtained from Dr. Ovid Curd records.  . Breast cancer Daughter   . Migraines Daughter     SOCIAL HX:  Social History   Socioeconomic History  . Marital status: Married    Spouse name: Not on file  . Number of children: 4  . Years of education: PHD  . Highest education level: Not on file  Occupational History  . Not on file  Tobacco Use  . Smoking status: Former Smoker    Years: 2.00    Types: Cigarettes  . Smokeless tobacco: Never Used  Substance and Sexual Activity  . Alcohol use: Yes    Alcohol/week: 1.0 standard drinks    Types: 1 Cans of beer per week    Comment: "a good drink every day" BEER/WINE  . Drug use: No  . Sexual activity: Not on file  Other Topics Concern  . 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   Lives in [place].   Orig from [place], relocated to [place] in late 1970s.   Has 3 children from first marriage, one from second marriage.   One daughter is an OB/GYN in [place], [place].   Distant hx of smoking cigs, quit 1974, drinks a whisky drink qod.   Exercise: 3 X /week goes to "the club" and does nautilus and walking machine, also swims 3 x/week.   Social Determinants of Health   Financial Resource Strain:   . Difficulty of Paying Living Expenses: Not on file  Food Insecurity:   . Worried About Charity fundraiser in the Last Year: Not on file  . Ran Out of Food in the Last Year: Not on file  Transportation Needs:   . Lack of Transportation (Medical): Not  on file  . Lack of Transportation (Non-Medical): Not on file  Physical Activity:   . Days of Exercise per Week: Not on file  . Minutes of Exercise per Session: Not on file  Stress:   . Feeling of Stress : Not on file  Social Connections:   . Frequency of Communication with Friends and Family: Not on file  . Frequency of Social Gatherings with Friends and Family: Not on file  . Attends Religious Services: Not on file  . Active Member of Clubs or Organizations: Not on file  . Attends Archivist Meetings: Not on file  . Marital Status: Not on  file      Current Outpatient Medications:  .  aspirin 325 MG EC tablet, Take 1 tablet (325 mg total) by mouth daily. (Patient taking differently: Take 325 mg by mouth daily. Mitchell Abbott takes 1/2 tab of 325mg .), Disp: 90 tablet, Rfl: 3 .  mupirocin ointment (BACTROBAN) 2 %, , Disp: , Rfl:  .  risperiDONE (RISPERDAL) 0.5 MG tablet, Take 0.125-0.25 mg by mouth See admin instructions. 1/4 tablet in am and 1/2 tablet pm, Disp: , Rfl:  .  rivastigmine (EXELON) 13.3 MG/24HR, Place 13.3 mg onto the skin daily., Disp: , Rfl:  .  tamsulosin (FLOMAX) 0.4 MG CAPS capsule, Take 2 capsules (0.8 mg total) by mouth daily after supper. (Patient taking differently: Take 0.4 mg by mouth daily after supper. ), Disp: 180 capsule, Rfl: 3 .  terbinafine (LAMISIL) 1 % cream, , Disp: , Rfl:  .  THERATEARS 0.25 % SOLN, , Disp: , Rfl:  .  Vitamins A & D (VITAMIN A & D) ointment, Apply 1 application topically 2 (two) times daily., Disp: , Rfl:  .  furosemide (LASIX) 20 MG tablet, TAKE 1 TABLET BY MOUTH EVERY DAY AS NEEDED FOR WORSENING OF EXTREMITY SWELLING (Patient not taking: Reported on 05/10/2019), Disp: 90 tablet, Rfl: 1  EXAM:  VITALS per patient if applicable: Wt 135 lb (61.2 kg)   BMI 21.14 kg/m    GENERAL: alert,oriented to person only.  Answers questions about 1/4 of the time but always has to have the question asked 3-4 times. HEENT: atraumatic, conjunttiva  clear, no obvious abnormalities on inspection of external nose and ears  NECK: normal movements of the head and neck  LUNGS: on inspection no signs of respiratory distress, breathing rate appears normal, no obvious gross SOB, gasping or wheezing  CV: no obvious cyanosis  MS: moves all visible extremities without noticeable abnormality  PSYCH/NEURO: pleasant and cooperative, no obvious depression or anxiety, speech and thought processing grossly intact  LABS: none today  Lab Results  Component Value Date   TSH 1.41 10/13/2011   Lab Results  Component Value Date   WBC 7.1 06/28/2018   HGB 13.8 06/28/2018   HCT 43.6 06/28/2018   MCV 101.4 (H) 06/28/2018   PLT 293 06/28/2018   Lab Results  Component Value Date   CREATININE 1.05 06/28/2018   BUN 18 06/28/2018   NA 139 06/28/2018   K 4.8 06/28/2018   CL 106 06/28/2018   CO2 24 06/28/2018   Lab Results  Component Value Date   ALT 16 06/28/2018   AST 22 06/28/2018   ALKPHOS 64 06/28/2018   BILITOT 0.7 06/28/2018   Lab Results  Component Value Date   CHOL 168 10/22/2014   Lab Results  Component Value Date   HDL 53.10 10/22/2014   Lab Results  Component Value Date   LDLCALC 97 10/22/2014   Lab Results  Component Value Date   TRIG 91.0 10/22/2014   Lab Results  Component Value Date   CHOLHDL 3 10/22/2014   Lab Results  Component Value Date   PSA 1.73 08/24/2013   PSA 1.30 01/05/2012   No results found for: HGBA1C  ASSESSMENT AND PLAN:  Discussed the following assessment and plan:  1) Dementia w/behavioral disorder.  Progressing slowly. Needs assistance with all ADLs except feeding himself. Continue going to senior center. Wife is taking excellent care of him.  He turns 89 this month!  2) BPH: I think we've got him as controlled as much as we can  given the circumstances. Continue flomax qd.  No changes today.  Spent 15 min with Mitchell Abbott today, with >50% of this time spent in counseling and care  coordination regarding the above problems.  -we discussed possible serious and likely etiologies, options for evaluation and workup, limitations of telemedicine visit vs in person visit, treatment, treatment risks and precautions. Mitchell Abbott prefers to treat via telemedicine empirically rather then risking or undertaking an in person visit at this moment. Patient agrees to seek prompt in person care if worsening, new symptoms arise, or if is not improving with treatment.   I discussed the assessment and treatment plan with the patient. The patient was provided an opportunity to ask questions and all were answered. The patient agreed with the plan and demonstrated an understanding of the instructions.   The patient was advised to call back or seek an in-person evaluation if the symptoms worsen or if the condition fails to improve as anticipated.  F/u: 6 mo RCI  Signed:  Crissie Sickles, MD           05/10/2019

## 2019-05-21 ENCOUNTER — Ambulatory Visit: Payer: Medicare Other | Attending: Internal Medicine

## 2019-05-21 DIAGNOSIS — Z20822 Contact with and (suspected) exposure to covid-19: Secondary | ICD-10-CM

## 2019-05-22 LAB — NOVEL CORONAVIRUS, NAA: SARS-CoV-2, NAA: NOT DETECTED

## 2019-05-23 ENCOUNTER — Telehealth: Payer: Self-pay

## 2019-05-23 NOTE — Telephone Encounter (Signed)
Caller advise of negative result and verbalized understanding

## 2019-06-05 DIAGNOSIS — Z9189 Other specified personal risk factors, not elsewhere classified: Secondary | ICD-10-CM | POA: Diagnosis not present

## 2019-06-11 DIAGNOSIS — Z9189 Other specified personal risk factors, not elsewhere classified: Secondary | ICD-10-CM | POA: Diagnosis not present

## 2019-08-21 DIAGNOSIS — H9193 Unspecified hearing loss, bilateral: Secondary | ICD-10-CM | POA: Insufficient documentation

## 2019-08-22 ENCOUNTER — Encounter: Payer: Self-pay | Admitting: Family Medicine

## 2019-08-24 ENCOUNTER — Emergency Department (HOSPITAL_COMMUNITY): Payer: Medicare PPO

## 2019-08-24 ENCOUNTER — Emergency Department (HOSPITAL_COMMUNITY)
Admission: EM | Admit: 2019-08-24 | Discharge: 2019-08-24 | Disposition: A | Discharge status: Home / Self Care | Payer: Medicare PPO | Attending: Emergency Medicine | Admitting: Emergency Medicine

## 2019-08-24 DIAGNOSIS — R55 Syncope and collapse: Secondary | ICD-10-CM | POA: Diagnosis not present

## 2019-08-24 DIAGNOSIS — R404 Transient alteration of awareness: Secondary | ICD-10-CM | POA: Diagnosis not present

## 2019-08-24 DIAGNOSIS — R4020 Unspecified coma: Secondary | ICD-10-CM | POA: Diagnosis not present

## 2019-08-24 DIAGNOSIS — R4182 Altered mental status, unspecified: Secondary | ICD-10-CM | POA: Diagnosis not present

## 2019-08-24 DIAGNOSIS — G309 Alzheimer's disease, unspecified: Secondary | ICD-10-CM | POA: Insufficient documentation

## 2019-08-24 DIAGNOSIS — N400 Enlarged prostate without lower urinary tract symptoms: Secondary | ICD-10-CM | POA: Insufficient documentation

## 2019-08-24 DIAGNOSIS — Z79899 Other long term (current) drug therapy: Secondary | ICD-10-CM | POA: Insufficient documentation

## 2019-08-24 DIAGNOSIS — R456 Violent behavior: Secondary | ICD-10-CM | POA: Diagnosis not present

## 2019-08-24 DIAGNOSIS — I959 Hypotension, unspecified: Secondary | ICD-10-CM | POA: Diagnosis not present

## 2019-08-24 DIAGNOSIS — J9811 Atelectasis: Secondary | ICD-10-CM | POA: Diagnosis not present

## 2019-08-24 LAB — CBC WITH DIFFERENTIAL/PLATELET
Abs Immature Granulocytes: 0.05 10*3/uL (ref 0.00–0.07)
Basophils Absolute: 0 10*3/uL (ref 0.0–0.1)
Basophils Relative: 0 %
Eosinophils Absolute: 0.2 10*3/uL (ref 0.0–0.5)
Eosinophils Relative: 2 %
HCT: 46.5 % (ref 39.0–52.0)
Hemoglobin: 14.8 g/dL (ref 13.0–17.0)
Immature Granulocytes: 1 %
Lymphocytes Relative: 13 %
Lymphs Abs: 1.4 10*3/uL (ref 0.7–4.0)
MCH: 33.4 pg (ref 26.0–34.0)
MCHC: 31.8 g/dL (ref 30.0–36.0)
MCV: 105 fL — ABNORMAL HIGH (ref 80.0–100.0)
Monocytes Absolute: 1.1 10*3/uL — ABNORMAL HIGH (ref 0.1–1.0)
Monocytes Relative: 11 %
Neutro Abs: 7.5 10*3/uL (ref 1.7–7.7)
Neutrophils Relative %: 73 %
Platelets: 315 10*3/uL (ref 150–400)
RBC: 4.43 MIL/uL (ref 4.22–5.81)
RDW: 12.8 % (ref 11.5–15.5)
WBC: 10.2 10*3/uL (ref 4.0–10.5)
nRBC: 0 % (ref 0.0–0.2)

## 2019-08-24 LAB — BASIC METABOLIC PANEL
Anion gap: 11 (ref 5–15)
BUN: 23 mg/dL (ref 8–23)
CO2: 24 mmol/L (ref 22–32)
Calcium: 9.2 mg/dL (ref 8.9–10.3)
Chloride: 103 mmol/L (ref 98–111)
Creatinine, Ser: 1.11 mg/dL (ref 0.61–1.24)
GFR calc Af Amer: >60 mL/min (ref >60)
GFR calc non Af Amer: 57 mL/min — ABNORMAL LOW (ref >60)
Glucose, Bld: 93 mg/dL (ref 70–99)
Potassium: 5.2 mmol/L — ABNORMAL HIGH (ref 3.5–5.1)
Sodium: 138 mmol/L (ref 135–145)

## 2019-08-24 LAB — CBG MONITORING, ED: Glucose-Capillary: 93 mg/dL (ref 70–99)

## 2019-08-24 NOTE — ED Notes (Signed)
Dr. Tamera Punt made aware of difficulty obtaining EKG.

## 2019-08-24 NOTE — ED Notes (Signed)
Pt was discharged from the ED. Pt read and understood discharge paperwork. Pt had vital signs completed. Pt conscious, breathing, and A&Ox4. No distress noted. Pt speaking in complete sentences. Pt brought out of the ED via wheelchair. E-signature not available. 

## 2019-08-24 NOTE — ED Notes (Signed)
EKG obtained with assistance of patient's wife, Judson Roch, Therapist, sports, this RN, and [place], [place]

## 2019-08-24 NOTE — ED Notes (Signed)
Pt transported to xray 

## 2019-08-24 NOTE — ED Notes (Signed)
Attempt made to obtain EKG, patient continues to remove leads and EKG stickers not allowing EKG capture.

## 2019-08-24 NOTE — ED Provider Notes (Signed)
[facility] EMERGENCY DEPARTMENT Provider Note   CSN: [id number] Arrival date & time: 08/24/19  1608     History Chief Complaint  Patient presents with  . Loss of Consciousness    Mitchell Abbott is a 86 y.o. male.  Patient is a 86 year old male who presents after syncopal event.  He lives at home with his daughter.  He was at an adult daycare and reportedly had a brief syncopal event.  He was sitting in his chair and had a short episode of unresponsiveness.  He reportedly got tense but there was no witnessed seizure activity.  He did not fall out of the chair.  His daughter said he is currently at his baseline mental status.  She said he was acting normally prior to this event.  He has had no recent illnesses.  No fevers.  No cough or cold symptoms.  No vomiting or diarrhea.  History is limited due to his dementia.        Past Medical History:  Diagnosis Date  . Actinic keratoses   . Adrenal hemorrhage ([place]) 2001   s/p fall from ladder--a f/u CT abd showed resolution of the hemorrhage- (duodenal polyp incidentally noted;  f/u EGD by Dr. Watt Climes 03/2000 showed small lipomatous mass in duodenum which was confirmed by biopsy.  . Alzheimer's dementia ([place])    with behav disturbance and apathy.  [facility] referral as of 07/2013.  They added namenda 07/2014 due to pt's signif decline in the prior 6 mo.  . Arthralgia of knee, right 2009   exam suspicious for meniscal origin  . BPH (benign prostatic hypertrophy)    History of acute urinary retention (2006)  Hx of elevated PSA (7.26) + right sided prostate nodule on DRE by urologist.  PSA did not go down with tx with antibiotics--biopsy was done and showed NO MALIGNANCY but some chronic inflammation was present--turned out related to acute cystitis (enterococcus UTI).  PSA was 1.17 in 07/2011 at urologist's (Dr. Karsten Ro).  F/u at urol is now prn as of 08/2014.  Marland Kitchen BPPV (benign paroxysmal positional vertigo)   . DDD  (degenerative disc disease), cervical    C5-6, C6-7 primarily  . Headache in back of head    Neurologist treated him with PT for this problem and it helped  . Hearing deficit    Bilateral; noise damage in [employer].  Has had hearing aids since about 2008.  Marland Kitchen Herpes zoster 07/25/2018   suspected--valtrex rx'd.  Marland Kitchen History of acute cystitis 08/2009   with acute prostatitis (enterococcus faecalis)  . History of solitary pulmonary nodule    Initially noted on CT abd for his adrenal hemorrhage s/p fall from ladder 2001.  F/u CT chest 04/2000 showed that it had shrunk from 58mm to 54mm and had no malignant features.  . History of TIA (transient ischemic attack) @ 2008   02/2001 and 01/2006 carotid dopplers showed 50% or less stenosis.  . Left bundle branch hemiblock    mentioned in dx of old records 04/15/2009  . Migraine headache with aura onset in teens   No migraines since about 2004  . Poor dentition    As of 07/2019 being referred to oral surgeon for removal of all implants and all remaining teeth  . Skin cancer     Patient Active Problem List   Diagnosis Date Noted  . Hearing difficulty of both ears 08/21/2019  . Urethral bleeding 09/02/2018  . Rash 07/25/2018  . Syncope 06/27/2016  .  Maxillary sinusitis 05/27/2015  . Apathy 01/28/2015  . Allergic rhinitis 01/20/2015  . Bacterial conjunctivitis of left eye 11/05/2014  . Alzheimer's disease with late onset ([place]) 01/15/2014  . Chronic renal insufficiency, stage II (mild) 09/03/2013  . Observation for suspected malignant neoplasm 09/03/2013  . Preventative health care 09/03/2013  . Orthostatic syncope 02/05/2013  . Mild cognitive impairment 10/20/2012  . Cervical myofascial strain 08/15/2012  . Sacroiliac joint pain 02/18/2012  . History of TIA (transient ischemic attack)   . BPH (benign prostatic hypertrophy) 10/13/2011  . Headache disorder 10/13/2011  . Dementia ([place]) 10/13/2011    Past Surgical History:  Procedure Laterality  Date  . Carotid dopplers  05/2016   1-39% stenosis bilat ICA.  . cataract extrac    . dental     implants  . PROSTATE BIOPSY  04/07/2005   For elevated PSA + right sided prostate nodule on DRE by urologist:  benign biopsy.  . TONSILLECTOMY  preteen  . TRANSTHORACIC ECHOCARDIOGRAM  06/28/2016   EF 60-65%, mild dilatation of ascending aorta.       Family History  Problem Relation Age of Onset  . Sudden death Mother 37       FH info obtained from Dr. Ovid Curd records.  . Breast cancer Daughter   . Migraines Daughter     Social History   Tobacco Use  . Smoking status: Former Smoker    Years: 2.00    Types: Cigarettes  . Smokeless tobacco: Never Used  Substance Use Topics  . Alcohol use: Yes    Alcohol/week: 1.0 standard drinks    Types: 1 Cans of beer per week    Comment: "a good drink every day" BEER/WINE  . Drug use: No    Home Medications Prior to Admission medications   Medication Sig Start Date End Date Taking? Authorizing Provider  aspirin 325 MG EC tablet Take 1 tablet (325 mg total) by mouth daily. Patient taking differently: Take 325 mg by mouth daily. Pt takes 1/2 tab of 325mg . 10/20/12   Marcial Pacas, MD  furosemide (LASIX) 20 MG tablet TAKE 1 TABLET BY MOUTH EVERY DAY AS NEEDED FOR WORSENING OF EXTREMITY SWELLING Patient not taking: Reported on 05/10/2019 10/30/18   McGowen, Adrian Blackwater, MD  mupirocin ointment (BACTROBAN) 2 %  07/28/18   [provider]  risperiDONE (RISPERDAL) 0.5 MG tablet Take 0.125-0.25 mg by mouth See admin instructions. 1/4 tablet in am and 1/2 tablet pm    [provider]  rivastigmine (EXELON) 13.3 MG/24HR Place 13.3 mg onto the skin daily.    [provider]  tamsulosin (FLOMAX) 0.4 MG CAPS capsule Take 2 capsules (0.8 mg total) by mouth daily after supper. Patient taking differently: Take 0.4 mg by mouth daily after supper.  08/11/16   McGowen, Adrian Blackwater, MD  terbinafine (LAMISIL) 1 % cream  12/21/18   [provider]  THERATEARS 0.25 % SOLN  07/28/18   [provider]  Vitamins A & D (VITAMIN A & D) ointment Apply 1 application topically 2 (two) times daily.    [provider]    Allergies    Patient has no known allergies.  Review of Systems   Review of Systems  Unable to perform ROS: Dementia    Physical Exam Updated Vital Signs BP 137/78 (BP Location: Right Arm)   Pulse 64   Temp 98.9 F (37.2 C) (Oral)   Resp 18   SpO2 99%   Physical Exam Constitutional:  Appearance: He is well-developed.  HENT:     Head: Normocephalic and atraumatic.  Eyes:     Pupils: Pupils are equal, round, and reactive to light.  Cardiovascular:     Rate and Rhythm: Normal rate and regular rhythm.     Heart sounds: Normal heart sounds.  Pulmonary:     Effort: Pulmonary effort is normal. No respiratory distress.     Breath sounds: Normal breath sounds. No wheezing or rales.  Chest:     Chest wall: No tenderness.  Abdominal:     General: Bowel sounds are normal.     Palpations: Abdomen is soft.     Tenderness: There is no abdominal tenderness. There is no guarding or rebound.  Musculoskeletal:        General: Normal range of motion.     Cervical back: Normal range of motion and neck supple.     Comments: No pain on range of motion of the extremities  Lymphadenopathy:     Cervical: No cervical adenopathy.  Skin:    General: Skin is warm and dry.     Findings: No rash.  Neurological:     General: No focal deficit present.     Mental Status: He is alert. Mental status is at baseline.     Comments: He is alert and awake.  He moves all extremities symmetrically.  No focal deficits are noted.  He is confused and does not answer questions.  His daughter is at bedside and says he is at his baseline mental status     ED Results / Procedures / Treatments   Labs (all labs ordered are listed, but only abnormal results are displayed) Labs Reviewed  BASIC METABOLIC PANEL -  Abnormal; Notable for the following components:      Result Value   Potassium 5.2 (*)    GFR calc non Af Amer 57 (*)    All other components within normal limits  CBC WITH DIFFERENTIAL/PLATELET - Abnormal; Notable for the following components:   MCV 105.0 (*)    Monocytes Absolute 1.1 (*)    All other components within normal limits  URINALYSIS, ROUTINE W REFLEX MICROSCOPIC  CBG MONITORING, ED    EKG EKG Interpretation  Date/Time:  Friday August 24 2019 18:48:56 EDT Ventricular Rate:  114 PR Interval:  178 QRS Duration: 104 QT Interval:  336 QTC Calculation: 463 R Axis:   -78 Text Interpretation: Sinus tachycardia Left axis deviation ST & T wave abnormality, consider lateral ischemia Abnormal ECG similar to prior EKGs Confirmed by Malvin Johns [phone]) on 08/24/2019 8:40:34 PM   Radiology DG Chest 2 View  Result Date: 08/24/2019 CLINICAL DATA:  Syncope.  Altered mental status. EXAM: CHEST - 2 VIEW COMPARISON:  06/28/2018. FINDINGS: Mediastinum and hilar structures normal. Stable mild cardiomegaly. No pulmonary venous congestion. Low lung volumes with mild bibasilar atelectasis. No pleural effusion or pneumothorax. Degenerative change thoracic spine. Thoracic spine scoliosis. Degenerative changes both shoulders. IMPRESSION: 1.  Stable cardiomegaly. 2.  Low lung volumes with mild bibasilar atelectasis. Electronically Signed   By: Marcello Moores  Register   On: 08/24/2019 17:00   CT HEAD WO CONTRAST  Result Date: 08/24/2019 CLINICAL DATA:  Altered mental status. EXAM: CT HEAD WITHOUT CONTRAST TECHNIQUE: Contiguous axial images were obtained from the base of the skull through the vertex without intravenous contrast. COMPARISON:  None. FINDINGS: Brain: There is moderate severity cerebral atrophy with widening of the extra-axial spaces and ventricular dilatation. There are areas of decreased attenuation within the  white matter tracts of the supratentorial brain, consistent with microvascular  disease changes. Vascular: No hyperdense vessel or unexpected calcification. Skull: Normal. Negative for fracture or focal lesion. Sinuses/Orbits: There is marked severity right maxillary sinus mucosal thickening. Other: None. IMPRESSION: 1. Generalized cerebral atrophy. 2. Marked severity right maxillary sinus disease. 3. No acute intracranial abnormality. Electronically Signed   By: Virgina Norfolk M.D.   On: 08/24/2019 18:10    Procedures Procedures (including critical care time)  Medications Ordered in ED Medications - No data to display  ED Course  I have reviewed the triage vital signs and the nursing notes.  Pertinent labs & imaging results that were available during my care of the patient were reviewed by me and considered in my medical decision making (see chart for details).    MDM Rules/Calculators/A&P                      Patient presents after a brief syncopal event.  He has been at his baseline mental status since arrival to the ED.  His vital signs are stable.  He has not been hypotensive.  He has had no visualized arrhythmias.  His EKG showed a sinus rhythm.  His labs are nonconcerning.  He had a CT scan of his head which showed no acute abnormalities.  Chest x-ray shows no acute abnormality.  He was able to ambulate in the ED.  His daughter states that he seemed a little bit more wobbly than he normally is.  He was given something to eat and I discussed with her starting IV and may be given him some IV fluids and reassessing.  However she is ready to take him home.  She states that he gets wobbly from time to time it does not feel like he is any different than his baseline.  I did not find any focal deficits or suggestions of a stroke on clinical exam.  She is ready to take him home and does not want to wait for any further treatment.  He seems appropriate for discharge.  She was advised to follow-up with his PCP.  Return precautions were given. Final Clinical Impression(s) / ED  Diagnoses Final diagnoses:  Syncope and collapse    Rx / DC Orders ED Discharge Orders    None       Malvin Johns, MD 08/24/19 2048

## 2019-08-24 NOTE — ED Notes (Signed)
Pt transported to CT ?

## 2019-08-24 NOTE — ED Notes (Signed)
Pt's wife at bedside.

## 2019-08-24 NOTE — ED Triage Notes (Signed)
Pt BIB [facility] from the adult care center at [facility]. Per EMS staff witnessed patient have a syncopal event today. They stated the patient was sitting down and then just tensed up and slumped over. No fall occurred and patient did not hit his head. Pt does have dementia at baseline but per staff he is typically more alert at his baseline than he is now. Pt's daughter was contacted and she is on her way to the ED. Pt is alert upon ED arrival.

## 2019-08-24 NOTE — ED Notes (Signed)
Unable to obtain patient's EKG due to patient not allowing placement of stickers and EKG leads at this time.

## 2019-08-27 ENCOUNTER — Other Ambulatory Visit: Payer: Self-pay

## 2019-08-27 ENCOUNTER — Encounter: Payer: Self-pay | Admitting: Family Medicine

## 2019-08-27 ENCOUNTER — Ambulatory Visit (INDEPENDENT_AMBULATORY_CARE_PROVIDER_SITE_OTHER): Payer: Medicare PPO | Admitting: Family Medicine

## 2019-08-27 VITALS — BP 173/78 | HR 59 | Temp 98.3°F | Resp 16 | Ht 67.0 in | Wt 147.4 lb

## 2019-08-27 DIAGNOSIS — R55 Syncope and collapse: Secondary | ICD-10-CM

## 2019-08-27 DIAGNOSIS — E860 Dehydration: Secondary | ICD-10-CM | POA: Diagnosis not present

## 2019-08-27 DIAGNOSIS — F0391 Unspecified dementia with behavioral disturbance: Secondary | ICD-10-CM

## 2019-08-27 DIAGNOSIS — K089 Disorder of teeth and supporting structures, unspecified: Secondary | ICD-10-CM

## 2019-08-27 NOTE — Progress Notes (Addendum)
Med student, Bennie Dallas, did not see this pt.  I saw him and my note is in EMR. Signed:  Crissie Sickles, MD           08/28/2019   CC:  HPI:  PMH: Past Medical History:  Diagnosis Date   Actinic keratoses    Adrenal hemorrhage ([place]) 2001   s/p fall from ladder--a f/u CT abd showed resolution of the hemorrhage- (duodenal polyp incidentally noted;  f/u EGD by Dr. Watt Climes 03/2000 showed small lipomatous mass in duodenum which was confirmed by biopsy.   Alzheimer's dementia ([place])    with behav disturbance and apathy.  [facility] referral as of [facility].  They added namenda 07/2014 due to pt's signif decline in the prior 6 mo.   Arthralgia of knee, right 2009   exam suspicious for meniscal origin   BPH (benign prostatic hypertrophy)    History of acute urinary retention (2006)  Hx of elevated PSA (7.26) + right sided prostate nodule on DRE by urologist.  PSA did not go down with tx with antibiotics--biopsy was done and showed NO MALIGNANCY but some chronic inflammation was present--turned out related to acute cystitis (enterococcus UTI).  PSA was 1.17 in 07/2011 at urologist's (Dr. Karsten Ro).  F/u at urol is now prn as of 08/2014.   BPPV (benign paroxysmal positional vertigo)    DDD (degenerative disc disease), cervical    C5-6, C6-7 primarily   Headache in back of head    Neurologist treated him with PT for this problem and it helped   Hearing deficit    Bilateral; noise damage in [employer]  Has had hearing aids since about 2008.   Herpes zoster 07/25/2018   suspected--valtrex rx'd.   History of acute cystitis 08/2009   with acute prostatitis (enterococcus faecalis)   History of solitary pulmonary nodule    Initially noted on CT abd for his adrenal hemorrhage s/p fall from ladder 2001.  F/u CT chest 04/2000 showed that it had shrunk from 25mm to 40mm and had no malignant features.   History of TIA (transient ischemic attack) @ 2008   02/2001 and 01/2006 carotid dopplers showed 50% or  less stenosis.   Left bundle branch hemiblock    mentioned in dx of old records 04/15/2009   Migraine headache with aura onset in teens   No migraines since about 2004   Poor dentition    As of 07/2019 being referred to oral surgeon for removal of all implants and all remaining teeth   Skin cancer     M/A: Current Outpatient Medications on File Prior to Visit  Medication Sig Dispense Refill   aspirin 325 MG EC tablet Take 1 tablet (325 mg total) by mouth daily. (Patient taking differently: Take 325 mg by mouth daily. Pt takes 1/2 tab of 325mg .) 90 tablet 3   risperiDONE (RISPERDAL) 0.5 MG tablet Take 0.125-0.25 mg by mouth See admin instructions. 1/4 tablet in am and 1/2 tablet pm     rivastigmine (EXELON) 13.3 MG/24HR Place 13.3 mg onto the skin daily.     tamsulosin (FLOMAX) 0.4 MG CAPS capsule Take 2 capsules (0.8 mg total) by mouth daily after supper. (Patient taking differently: Take 0.4 mg by mouth daily after supper. ) 180 capsule 3   terbinafine (LAMISIL) 1 % cream      THERATEARS 0.25 % SOLN      Vitamins A & D (VITAMIN A & D) ointment Apply 1 application topically 2 (two) times daily.  furosemide (LASIX) 20 MG tablet TAKE 1 TABLET BY MOUTH EVERY DAY AS NEEDED FOR WORSENING OF EXTREMITY SWELLING (Patient not taking: Reported on 05/10/2019) 90 tablet 1   mupirocin ointment (BACTROBAN) 2 %      No current facility-administered medications on file prior to visit.   No Known Allergies  FH: Family History  Problem Relation Age of Onset   Sudden death Mother 25       FH info obtained from Dr. Ovid Curd records.   Breast cancer Daughter    Migraines Daughter     SH: Social History   Socioeconomic History   Marital status: Married    Spouse name: Not on file   Number of children: 4   Years of education: PHD   Highest education level: Not on file  Occupational History   Not on file  Tobacco Use   Smoking status: Former Smoker    Years: 2.00    Types: Cigarettes    Smokeless tobacco: Never Used  Substance and Sexual Activity   Alcohol use: Yes    Alcohol/week: 1.0 standard drinks    Types: 1 Cans of beer per week    Comment: "a good drink every day" BEER/WINE   Drug use: No   Sexual activity: Not on file  Other Topics Concern   Not on file  Social History Narrative   Married x 30+ yrs, retired Equities trader Geophysical data processor), 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   Lives in [place].   Orig from [place], relocated to [place] in late 1970s.   Has 3 children from first marriage, one from second marriage.   One daughter is an OB/GYN in [place], [place].   Distant hx of smoking cigs, quit 1974, drinks a whisky drink qod.   Exercise: 3 X /week goes to "the club" and does nautilus and walking machine, also swims 3 x/week.   Social Determinants of Health   Financial Resource Strain:    Difficulty of Paying Living Expenses:   Food Insecurity:    Worried About Charity fundraiser in the Last Year:    Arboriculturist in the Last Year:   Transportation Needs:    Film/video editor (Medical):    Lack of Transportation (Non-Medical):   Physical Activity:    Days of Exercise per Week:    Minutes of Exercise per Session:   Stress:    Feeling of Stress :   Social Connections:    Frequency of Communication with Friends and Family:    Frequency of Social Gatherings with Friends and Family:    Attends Religious Services:    Active Member of Clubs or Organizations:    Attends Music therapist:    Marital Status:     ROS: ROS  PE: Physical Exam  A/P: In summary, NAME is a AGE year old MAN/WOMAN with a past medical history of PMH who presents with CC. On physical exam, PE. My plan is

## 2019-08-27 NOTE — Progress Notes (Signed)
OFFICE VISIT  08/27/2019   CC:  Chief Complaint  Patient presents with  . Hospitalization Follow-up   HPI:    Patient is a 86 y.o. Caucasian male with marked dementia who presents for ED f/u from a visit on 08/24/19. Wife also with questions about patient's teeth.  To ED for possible syncopal event at his adult daycare.  No tonic/clonic seizure activity witnessed, but ?brief period of unresponsiveness before falling out of chair?  No injury.  Wife says he was in courtyard of the adult day care in 23 deg weather.  He does NOT drink fluids well at all (this is not new).    Exam in ED was unremarkable. Labs fine. CT head w/out contrast: IMPRESSION: 1. Generalized cerebral atrophy. 2. Marked severity right maxillary sinus disease. 3. No acute intracranial abnormality.  CXR showed stable cardiomegaly, low lung volumes with mild bibasilar atelectasis. EKG with sinus tachy to 114, some lateral ST/T changes and was similar to prior EKGs.  Wife [name] here with him, says she thinks he was likely dehydrated and this caused him to "pass out" briefly.  She said his bp was "low" in the ED.  ED note does state IVF was discussed but then says wife took pt home instead. He has been normal state of health since coming home from ED 3 d/a. Urine volume in diaper is good, light yellow, without foul odor.  Diaper change in morning "saturated".  Wife changes urine pad 3-4 times. Seems to be emptying bowels well.  Food intake seems stable. No fevers.  Fluid intake is about 12-15 ounces of diluted apple juice.  Only 2-3 oz coffee.    Sometimes some plain water.  A dental specialist has recommended he see an oral surgeon for eval:  He thinks he needs fairly extensive dental surery and [name]'s wife asks for my opinion about whether or not Gershon Mussel should get the procedure done. She describes getting 8 teeth pulled and then implantation of some permanent lower dentures. Oral surgeon eval is within the next  week.  Past Medical History:  Diagnosis Date  . Actinic keratoses   . Adrenal hemorrhage ([place]) 2001   s/p fall from ladder--a f/u CT abd showed resolution of the hemorrhage- (duodenal polyp incidentally noted;  f/u EGD by Dr. Watt Climes 03/2000 showed small lipomatous mass in duodenum which was confirmed by biopsy.  . Alzheimer's dementia ([place])    with behav disturbance and apathy.  [facility] as [facility].  They added namenda 07/2014 due to pt's signif decline in the prior 6 mo.  . Arthralgia of knee, right 2009   exam suspicious for meniscal origin  . BPH (benign prostatic hypertrophy)    History of acute urinary retention (2006)  Hx of elevated PSA (7.26) + right sided prostate nodule on DRE by urologist.  PSA did not go down with tx with antibiotics--biopsy was done and showed NO MALIGNANCY but some chronic inflammation was present--turned out related to acute cystitis (enterococcus UTI).  PSA was 1.17 in 07/2011 at urologist's (Dr. Karsten Ro).  F/u at urol is now prn as of 08/2014.  Marland Kitchen BPPV (benign paroxysmal positional vertigo)   . DDD (degenerative disc disease), cervical    C5-6, C6-7 primarily  . Headache in back of head    Neurologist treated him with PT for this problem and it helped  . Hearing deficit    Bilateral; noise damage in [employer]  Has had hearing aids since about 2008.  Marland Kitchen Herpes  zoster 07/25/2018   suspected--valtrex rx'd.  Marland Kitchen History of acute cystitis 08/2009   with acute prostatitis (enterococcus faecalis)  . History of solitary pulmonary nodule    Initially noted on CT abd for his adrenal hemorrhage s/p fall from ladder 2001.  F/u CT chest 04/2000 showed that it had shrunk from 55mm to 61mm and had no malignant features.  . History of TIA (transient ischemic attack) @ 2008   02/2001 and 01/2006 carotid dopplers showed 50% or less stenosis.  . Left bundle branch hemiblock    mentioned in dx of old records 04/15/2009  . Migraine headache with aura onset in teens    No migraines since about 2004  . Poor dentition    As of 07/2019 being referred to oral surgeon for removal of all implants and all remaining teeth  . Skin cancer     Past Surgical History:  Procedure Laterality Date  . Carotid dopplers  05/2016   1-39% stenosis bilat ICA.  . cataract extrac    . dental     implants  . PROSTATE BIOPSY  04/07/2005   For elevated PSA + right sided prostate nodule on DRE by urologist:  benign biopsy.  . TONSILLECTOMY  preteen  . TRANSTHORACIC ECHOCARDIOGRAM  06/28/2016   EF 60-65%, mild dilatation of ascending aorta.    Outpatient Medications Prior to Visit  Medication Sig Dispense Refill  . aspirin 325 MG EC tablet Take 1 tablet (325 mg total) by mouth daily. (Patient taking differently: Take 325 mg by mouth daily. Pt takes 1/2 tab of 325mg .) 90 tablet 3  . risperiDONE (RISPERDAL) 0.5 MG tablet Take 0.125-0.25 mg by mouth See admin instructions. 1/4 tablet in am and 1/2 tablet pm    . rivastigmine (EXELON) 13.3 MG/24HR Place 13.3 mg onto the skin daily.    . tamsulosin (FLOMAX) 0.4 MG CAPS capsule Take 2 capsules (0.8 mg total) by mouth daily after supper. (Patient taking differently: Take 0.4 mg by mouth daily after supper. ) 180 capsule 3  . terbinafine (LAMISIL) 1 % cream     . THERATEARS 0.25 % SOLN     . Vitamins A & D (VITAMIN A & D) ointment Apply 1 application topically 2 (two) times daily.    . furosemide (LASIX) 20 MG tablet TAKE 1 TABLET BY MOUTH EVERY DAY AS NEEDED FOR WORSENING OF EXTREMITY SWELLING (Patient not taking: Reported on 05/10/2019) 90 tablet 1  . mupirocin ointment (BACTROBAN) 2 %      No facility-administered medications prior to visit.    No Known Allergies  ROS As per HPI  PE: Blood pressure (!) 173/78, pulse (!) 59, temperature 98.3 F (36.8 C), temperature source Temporal, resp. rate 16, height 5\' 7"  (1.702 m), weight 147 lb 6.4 oz (66.9 kg), SpO2 97 %. Body mass index is 23.09 kg/m.  Gen: Alert, well  appearing.  Patient is sitting in chair, moaning, does not talk---as per his usual. Oral mucosa moist/pink. CV: RRR, no m/r/g.   LUNGS: CTA bilat, nonlabored resps, good aeration in all lung fields. EXT: no clubbing or cyanosis.  no edema.     LABS:    Chemistry      Component Value Date/Time   NA 138 08/24/2019 1800   K 5.2 (H) 08/24/2019 1800   CL 103 08/24/2019 1800   CO2 24 08/24/2019 1800   BUN 23 08/24/2019 1800   CREATININE 1.11 08/24/2019 1800   CREATININE 1.02 09/05/2015 1528      Component  Value Date/Time   CALCIUM 9.2 08/24/2019 1800   ALKPHOS 64 06/28/2018 1151   AST 22 06/28/2018 1151   ALT 16 06/28/2018 1151   BILITOT 0.7 06/28/2018 1151     Lab Results  Component Value Date   WBC 10.2 08/24/2019   HGB 14.8 08/24/2019   HCT 46.5 08/24/2019   MCV 105.0 (H) 08/24/2019   PLT 315 08/24/2019   Lab Results  Component Value Date   TROPONINI <0.03 06/28/2018     IMPRESSION AND PLAN:  1) Syncope in pt with dementia who does a poor job of hydrating.  Full ED eval reassuring.  Suspect mild dehydration + heat contributed to his issue that day. [name] is constantly working on trying to get him to hydrate better---it is definitely a battle due to his dementia.  He is fine now.  Doesn't seem to have any signs of acute illness currently. Discussed various tricks to try to get him to accept fluids better. Minimize time in hot weather.  Regarding his dental issue: I told [name] he would likely do fine from a physical standpoint to withstand the surgery, but recovery would be a challenge due to likely decreased ability to get nutrition in him for the short term, at least.  Also, anesthesia would be potentially pose an issue with his dementia with behavioral disturbance.  His children do not want him to get the procedure.  Hopefully [name] can get some good input/recommendation about this from the oral surgeon b/c she is certainly struggling with this decision right  now.  Spent 30 min with pt today, with >50% of this time spent in counseling and care coordination regarding the above problems.  An After Visit Summary was printed and given to the patient.  FOLLOW UP: Return in about 6 months (around 02/27/2020) for routine chronic illness f/u.  Signed:  Crissie Sickles, MD           08/27/2019

## 2019-08-30 ENCOUNTER — Telehealth: Payer: Self-pay

## 2019-08-30 NOTE — Telephone Encounter (Signed)
Pls reschedule my 1 oclock pt on 4/14 and I can do the phone consult with the oral surgeon. Pls put in the 1 oclock appt slot "phone consult with oral surgeon about Mitchell Abbott". They can call me at 1 oclock.-thx

## 2019-08-30 NOTE — Telephone Encounter (Signed)
Dr. Buelah Manis with [facility] is requesting a phone consult with Dr. Anitra Lauth on 09/12/19 at 1:00 to discuss mutual patient. I advised his assistant that Dr. Anitra Lauth already has patient's booked for that day & time. Please call her back

## 2019-08-30 NOTE — Telephone Encounter (Signed)
Patient had an appt today with Oral Surgeon, per wife (DPR) patient needs referral to ENT per oral surgeon  Please call [name] at [phone]

## 2019-08-30 NOTE — Telephone Encounter (Signed)
Pt just had visit with PCP on Monday, 3/29 and requesting referral to ENT. CT scan done on 3/26 showed infection  Please advise, thanks.

## 2019-08-30 NOTE — Telephone Encounter (Signed)
Add on message regarding referral and dental procedure.  Dr. Buelah Manis with [facility] is requesting a phone consult with Dr. Anitra Lauth on 09/12/19 at 1:00 to discuss mutual patient. I advised his assistant that Dr. Anitra Lauth already has patient's booked for that day & time. CB # is [phone].  Please advise, thanks.

## 2019-08-30 NOTE — Telephone Encounter (Signed)
Patient's wife, [name] notified regarding consult and that next steps will be discussed during consult on 4/14/. Please assist with re-scheduling his 1pm that day. PCP comments below for appt slot.

## 2019-08-30 NOTE — Telephone Encounter (Signed)
Phone note already started regarding referral and dental procedure. This message has been added to that note and sent to PCP.

## 2019-09-03 NOTE — Telephone Encounter (Signed)
Please put a block on Dr. Idelle Leech schedule on 09/12/19 at 1:00 for a meeting with Dr. Buelah Manis re patient Mitchell Abbott. I have left a message for the patient currently scheduled during that time to call back to schedule at a different time.

## 2019-09-03 NOTE — Telephone Encounter (Signed)
[place] from Dr. Dorian Heckle (Oral Surgeon) called regarding consult telephone visit between 2 providers on 09/12/19 at 1PM.   This time slot for 4/14 at 1:00PM has been blocked in system. Time/date confirmed by [place].  She will call Lamoyne Palencia's phone [phone] for warm transfer to Dr. Anitra Lauth.

## 2019-09-03 NOTE — Telephone Encounter (Signed)
Noted.Will do

## 2019-09-12 NOTE — Telephone Encounter (Signed)
Sent to Dr.McGowen as FYI °

## 2019-09-12 NOTE — Telephone Encounter (Signed)
Because of Dr. Anitra Lauth out of office today, phone consult with Dr. Buelah Manis and Dr. Anitra Lauth has been rescheduled.  First available with Dr. Buelah Manis is on 4/29.   Please send updated date/time to Dr. Anitra Lauth to keep him informed.   This time slot for 4/29 at 1:00PM has been blocked in system. Time/date confirmed by Mitchell Abbott.  She will call Mitchell Abbott's phone [phone] for warm transfer to Dr. Anitra Lauth.

## 2019-09-19 ENCOUNTER — Encounter: Payer: Self-pay | Admitting: Family Medicine

## 2019-09-19 ENCOUNTER — Telehealth (INDEPENDENT_AMBULATORY_CARE_PROVIDER_SITE_OTHER): Payer: Medicare PPO | Admitting: Family Medicine

## 2019-09-19 ENCOUNTER — Other Ambulatory Visit: Payer: Self-pay

## 2019-09-19 ENCOUNTER — Telehealth: Payer: Self-pay

## 2019-09-19 VITALS — Temp 98.1°F

## 2019-09-19 DIAGNOSIS — F0391 Unspecified dementia with behavioral disturbance: Secondary | ICD-10-CM | POA: Diagnosis not present

## 2019-09-19 DIAGNOSIS — E860 Dehydration: Secondary | ICD-10-CM

## 2019-09-19 DIAGNOSIS — R066 Hiccough: Secondary | ICD-10-CM | POA: Diagnosis not present

## 2019-09-19 DIAGNOSIS — R1110 Vomiting, unspecified: Secondary | ICD-10-CM

## 2019-09-19 NOTE — Telephone Encounter (Signed)
Possible work in for today?

## 2019-09-19 NOTE — Telephone Encounter (Signed)
Patients wife called in stating that patient was throwing up last night, and would like to be seen today. I advised them they may not be able to come in office but a virtual visit may be ok if Dr can work patient in.    Please advise

## 2019-09-19 NOTE — Telephone Encounter (Signed)
PCP can do work in for 1:45 today. If patient unable to do this, please let me know. I will send back to PCP for other recommendations

## 2019-09-19 NOTE — Progress Notes (Signed)
Virtual Visit via Video Note  I connected with pt on 09/19/19 at  1:45 PM EDT by a video enabled telemedicine application and verified that I am speaking with the correct person using two identifiers.  Location patient: home Location provider:work or home office Persons participating in the virtual visit: patient, pt's wife, provider  I discussed the limitations of evaluation and management by telemedicine and the availability of in person appointments. The patient expressed understanding and agreed to proceed.  Telemedicine visit is a necessity given the COVID-19 restrictions in place at the current time.  HPI: 86 y/o WM being seen today for vomiting.  History is obtained by wife due to pt's dementia.  Was feeling fine yesterday.   Then yesterday evening he ate a little faster than usual and he stared getting hiccups. He then threw up his food.  He acted fine after that.  Then around 3:30 this morning he had a nose bleed and wife says it was not actively bleeding when she saw it and actually the dried material from nose area was black --only from left nostril.  She showed me this on a large wash cloth today and it looks charcoal colored.  No red blood. He has persistently had hiccups since yesterday evening.  Sleepier than usual. He ate BF today but no lunch yet.  He has drunk only about 6 oz today, only urinated twice today.   About 10 min ago he had a BM that was brown and formed. BP was 136/80 today.  P 75.  No fever.  No sign of any abd pain.  ROS:  no CP, no SOB, no wheezing, no cough, no dizziness, no HAs, no rashes, no melena/hematochezia.  No polyuria or polydipsia.  No myalgias or arthralgias.  No focal weakness, paresthesias, or tremors.  No acute vision or hearing abnormalities. No n/v/d or abd pain.  No palpitations.     Past Medical History:  Diagnosis Date  . Actinic keratoses   . Adrenal hemorrhage ([place]) 2001   s/p fall from ladder--a f/u CT abd showed resolution of the  hemorrhage- (duodenal polyp incidentally noted;  f/u EGD by Dr. Watt Climes 03/2000 showed small lipomatous mass in duodenum which was confirmed by biopsy.  . Alzheimer's dementia ([place])    with behav disturbance and apathy.  [facility] as [facility].  They added namenda 07/2014 due to pt's signif decline in the prior 6 mo.  . Arthralgia of knee, right 2009   exam suspicious for meniscal origin  . BPH (benign prostatic hypertrophy)    History of acute urinary retention (2006)  Hx of elevated PSA (7.26) + right sided prostate nodule on DRE by urologist.  PSA did not go down with tx with antibiotics--biopsy was done and showed NO MALIGNANCY but some chronic inflammation was present--turned out related to acute cystitis (enterococcus UTI).  PSA was 1.17 in 07/2011 at urologist's (Dr. Karsten Ro).  F/u at urol is now prn as of 08/2014.  Marland Kitchen BPPV (benign paroxysmal positional vertigo)   . DDD (degenerative disc disease), cervical    C5-6, C6-7 primarily  . Headache in back of head    Neurologist treated him with PT for this problem and it helped  . Hearing deficit    Bilateral; noise damage in [employer]  Has had hearing aids since about 2008.  Marland Kitchen Herpes zoster 07/25/2018   suspected--valtrex rx'd.  Marland Kitchen History of acute cystitis 08/2009   with acute prostatitis (enterococcus faecalis)  . History of  solitary pulmonary nodule    Initially noted on CT abd for his adrenal hemorrhage s/p fall from ladder 2001.  F/u CT chest 04/2000 showed that it had shrunk from 2mm to 20mm and had no malignant features.  . History of TIA (transient ischemic attack) @ 2008   02/2001 and 01/2006 carotid dopplers showed 50% or less stenosis.  . Left bundle branch hemiblock    mentioned in dx of old records 04/15/2009  . Migraine headache with aura onset in teens   No migraines since about 2004  . Poor dentition    As of 07/2019 being referred to oral surgeon for removal of all implants and all remaining teeth  . Skin cancer      Past Surgical History:  Procedure Laterality Date  . Carotid dopplers  05/2016   1-39% stenosis bilat ICA.  . cataract extrac    . dental     implants  . PROSTATE BIOPSY  04/07/2005   For elevated PSA + right sided prostate nodule on DRE by urologist:  benign biopsy.  . TONSILLECTOMY  preteen  . TRANSTHORACIC ECHOCARDIOGRAM  06/28/2016   EF 60-65%, mild dilatation of ascending aorta.    Family History  Problem Relation Age of Onset  . Sudden death Mother 78       FH info obtained from Dr. Ovid Curd records.  . Breast cancer Daughter   . Migraines Daughter      Current Outpatient Medications:  .  aspirin 325 MG EC tablet, Take 1 tablet (325 mg total) by mouth daily. (Patient taking differently: Take 325 mg by mouth daily. Pt takes 1/2 tab of 325mg .), Disp: 90 tablet, Rfl: 3 .  mupirocin ointment (BACTROBAN) 2 %, , Disp: , Rfl:  .  risperiDONE (RISPERDAL) 0.5 MG tablet, Take 0.125-0.25 mg by mouth See admin instructions. 1/4 tablet in am and 1/2 tablet pm, Disp: , Rfl:  .  rivastigmine (EXELON) 13.3 MG/24HR, Place 13.3 mg onto the skin daily., Disp: , Rfl:  .  tamsulosin (FLOMAX) 0.4 MG CAPS capsule, Take 2 capsules (0.8 mg total) by mouth daily after supper. (Patient taking differently: Take 0.4 mg by mouth daily after supper. ), Disp: 180 capsule, Rfl: 3 .  terbinafine (LAMISIL) 1 % cream, , Disp: , Rfl:  .  furosemide (LASIX) 20 MG tablet, TAKE 1 TABLET BY MOUTH EVERY DAY AS NEEDED FOR WORSENING OF EXTREMITY SWELLING (Patient not taking: Reported on 05/10/2019), Disp: 90 tablet, Rfl: 1 .  Vitamins A & D (VITAMIN A & D) ointment, Apply 1 application topically 2 (two) times daily., Disp: , Rfl:   EXAM:  VITALS per patient if applicable: Temp [id number] F (36.7 C) (Oral)    GENERAL: alert, oriented, appears well and in no acute distress  HEENT: atraumatic, conjunttiva clear, no obvious abnormalities on inspection of external nose and ears  NECK: normal movements of the head  and neck  LUNGS: on inspection no signs of respiratory distress, breathing rate appears normal, no obvious gross SOB, gasping or wheezing  CV: no obvious cyanosis  MS: moves all visible extremities without noticeable abnormality  PSYCH/NEURO: pleasant and cooperative, no obvious depression or anxiety, speech and thought processing grossly intact  LABS: none today   ASSESSMENT AND PLAN:  Discussed the following assessment and plan:  1) Hiccups, one episode of emesis.  No clear etiology, not overly concerning except for the information about the charcoal-colored material/residue that was found to have come out of his left nostril. Question episode of  coffee ground regurgitation.  He is not drinking very well and urine volume/frequency seems down. This early in the course of things with [name], I am not going to do anything regarding w/u.  Pt has a significant behavioral disturbance aspect of his dementia that significantly complicates his care, particularly with respect to doing any kind of blood or imaging workup.  He does not appear obtunded or toxic at this time and I recommended to start omeprazole 40mg  qd (wife has some 20mg  tabs of her own and she'll give him 2 of these daily, starting right now). Continue to push fluids and allow him to eat normally.  If recurrence of any of the charcoal colored mucous/regurg then he may need more urgent attention for suspected upper GI (gastric) bleeding. If unable to get much oral fluids in him and his urine output continues to decrease then he'll need to go to ED for dehydration/IV fluids. Signs/symptoms to call or return for were reviewed and pt expressed understanding.  -we discussed possible serious and likely etiologies, options for evaluation and workup, limitations of telemedicine visit vs in person visit, treatment, treatment risks and precautions. Pt prefers to treat via telemedicine empirically rather then risking or undertaking an in person  visit at this moment. Patient agrees to seek prompt in person care if worsening, new symptoms arise, or if is not improving with treatment.   I discussed the assessment and treatment plan with the patient. The patient was provided an opportunity to ask questions and all were answered. The patient agreed with the plan and demonstrated an understanding of the instructions.   The patient was advised to call back or seek an in-person evaluation if the symptoms worsen or if the condition fails to improve as anticipated.  F/u: if not improving appropriately.  Signed:  Crissie Sickles, MD           09/19/2019

## 2019-09-25 ENCOUNTER — Telehealth: Payer: Self-pay

## 2019-09-25 NOTE — Telephone Encounter (Signed)
Sent as Juluis Rainier  Pt's wife called to inform she has decided to opt out of dental procedure so consult no longer needed that was arranged for Thursday, 4/29.

## 2019-09-26 NOTE — Telephone Encounter (Signed)
OK pls make sure the oral surgeon is aware of cancellation of the visit we had set up-thx

## 2019-09-26 NOTE — Telephone Encounter (Signed)
Dr.Paola's office contacted to advise consult no longer needed.

## 2019-10-31 ENCOUNTER — Telehealth: Payer: Self-pay

## 2019-10-31 ENCOUNTER — Other Ambulatory Visit: Payer: Self-pay

## 2019-10-31 ENCOUNTER — Ambulatory Visit (INDEPENDENT_AMBULATORY_CARE_PROVIDER_SITE_OTHER): Payer: Medicare PPO | Admitting: Otolaryngology

## 2019-10-31 VITALS — Temp 97.7°F

## 2019-10-31 DIAGNOSIS — H6123 Impacted cerumen, bilateral: Secondary | ICD-10-CM | POA: Diagnosis not present

## 2019-10-31 DIAGNOSIS — H903 Sensorineural hearing loss, bilateral: Secondary | ICD-10-CM

## 2019-10-31 NOTE — Telephone Encounter (Signed)
Please advise if appropriate.

## 2019-10-31 NOTE — Progress Notes (Signed)
HPI: Mitchell Abbott is a 86 y.o. male who presents for evaluation of wax buildup in both ears in a older gentleman who wears hearing aids.  He has Alzheimer's and is not cooperative enough to have his ears cleaned. Wife also states that he had an implant placed on the right upper maxilla that apparently was pushed into the right maxillary sinus.  He has had no obvious infections or swelling on this side.  Past Medical History:  Diagnosis Date  . Actinic keratoses   . Adrenal hemorrhage ([place]) 2001   s/p fall from ladder--a f/u CT abd showed resolution of the hemorrhage- (duodenal polyp incidentally noted;  f/u EGD by Dr. Watt Climes 03/2000 showed small lipomatous mass in duodenum which was confirmed by biopsy.  . Alzheimer's dementia ([place])    with behav disturbance and apathy.  [facility] referral as of 07/[facility].  They added namenda 07/2014 due to pt's signif decline in the prior 6 mo.  . Arthralgia of knee, right 2009   exam suspicious for meniscal origin  . BPH (benign prostatic hypertrophy)    History of acute urinary retention (2006)  Hx of elevated PSA (7.26) + right sided prostate nodule on DRE by urologist.  PSA did not go down with tx with antibiotics--biopsy was done and showed NO MALIGNANCY but some chronic inflammation was present--turned out related to acute cystitis (enterococcus UTI).  PSA was 1.17 in 07/2011 at urologist's (Dr. Karsten Ro).  F/u at urol is now prn as of 08/2014.  Marland Kitchen BPPV (benign paroxysmal positional vertigo)   . DDD (degenerative disc disease), cervical    C5-6, C6-7 primarily  . Headache in back of head    Neurologist treated him with PT for this problem and it helped  . Hearing deficit    Bilateral; noise damage in [employer].  Has had hearing aids since about 2008.  Marland Kitchen Herpes zoster 07/25/2018   suspected--valtrex rx'd.  Marland Kitchen History of acute cystitis 08/2009   with acute prostatitis (enterococcus faecalis)  . History of solitary pulmonary nodule    Initially noted  on CT abd for his adrenal hemorrhage s/p fall from ladder 2001.  F/u CT chest 04/2000 showed that it had shrunk from 4mm to 24mm and had no malignant features.  . History of TIA (transient ischemic attack) @ 2008   02/2001 and 01/2006 carotid dopplers showed 50% or less stenosis.  . Left bundle branch hemiblock    mentioned in dx of old records 04/15/2009  . Migraine headache with aura onset in teens   No migraines since about 2004  . Poor dentition    As of 07/2019 being referred to oral surgeon for removal of all implants and all remaining teeth  . Skin cancer    Past Surgical History:  Procedure Laterality Date  . Carotid dopplers  05/2016   1-39% stenosis bilat ICA.  . cataract extrac    . dental     implants  . PROSTATE BIOPSY  04/07/2005   For elevated PSA + right sided prostate nodule on DRE by urologist:  benign biopsy.  . TONSILLECTOMY  preteen  . TRANSTHORACIC ECHOCARDIOGRAM  06/28/2016   EF 60-65%, mild dilatation of ascending aorta.   Social History   Socioeconomic History  . Marital status: Married    Spouse name: Not on file  . Number of children: 4  . Years of education: PHD  . Highest education level: Not on file  Occupational History  . Not on file  Tobacco  Use  . Smoking status: Former Smoker    Years: 2.00    Types: Cigarettes  . Smokeless tobacco: Never Used  Substance and Sexual Activity  . Alcohol use: Yes    Alcohol/week: 1.0 standard drinks    Types: 1 Cans of beer per week    Comment: "a good drink every day" BEER/WINE  . Drug use: No  . Sexual activity: Not on file  Other Topics Concern  . 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   Lives in [place].   Orig from [place], relocated to [place] in late 1970s.   Has 3 children from first marriage, one from second marriage.   One daughter is an OB/GYN in [place], [place].    Distant hx of smoking cigs, quit 1974, drinks a whisky drink qod.   Exercise: 3 X /week goes to "the club" and does nautilus and walking machine, also swims 3 x/week.   Social Determinants of Health   Financial Resource Strain:   . Difficulty of Paying Living Expenses:   Food Insecurity:   . Worried About Charity fundraiser in the Last Year:   . Arboriculturist in the Last Year:   Transportation Needs:   . Film/video editor (Medical):   Marland Kitchen Lack of Transportation (Non-Medical):   Physical Activity:   . Days of Exercise per Week:   . Minutes of Exercise per Session:   Stress:   . Feeling of Stress :   Social Connections:   . Frequency of Communication with Friends and Family:   . Frequency of Social Gatherings with Friends and Family:   . Attends Religious Services:   . Active Member of Clubs or Organizations:   . Attends Archivist Meetings:   Marland Kitchen Marital Status:    Family History  Problem Relation Age of Onset  . Sudden death Mother 76       FH info obtained from Dr. Ovid Curd records.  . Breast cancer Daughter   . Migraines Daughter    No Known Allergies Prior to Admission medications   Medication Sig Start Date End Date Taking? Authorizing Provider  aspirin 325 MG EC tablet Take 1 tablet (325 mg total) by mouth daily. Patient taking differently: Take 325 mg by mouth daily. Pt takes 1/2 tab of 325mg . 10/20/12  Yes Marcial Pacas, MD  furosemide (LASIX) 20 MG tablet TAKE 1 TABLET BY MOUTH EVERY DAY AS NEEDED FOR WORSENING OF EXTREMITY SWELLING 10/30/18  Yes McGowen, Adrian Blackwater, MD  mupirocin ointment (BACTROBAN) 2 %  07/28/18  Yes [provider]  risperiDONE (RISPERDAL) 0.5 MG tablet Take 0.125-0.25 mg by mouth See admin instructions. 1/4 tablet in am and 1/2 tablet pm   Yes [provider]  rivastigmine (EXELON) 13.3 MG/24HR Place 13.3 mg onto the skin daily.   Yes [provider]  tamsulosin (FLOMAX) 0.4 MG CAPS capsule Take 2 capsules (0.8 mg  total) by mouth daily after supper. Patient taking differently: Take 0.4 mg by mouth daily after supper.  08/11/16  Yes McGowen, Adrian Blackwater, MD  terbinafine (LAMISIL) 1 % cream  12/21/18  Yes [provider]  Vitamins A & D (VITAMIN A & D) ointment Apply 1 application topically 2 (two) times daily.   Yes [provider]     Positive ROS: Otherwise negative  All other  systems have been reviewed and were otherwise negative with the exception of those mentioned in the HPI and as above.  Physical Exam: Constitutional: Alert, well-appearing, no acute distress Ears: External ears without lesions or tenderness.  He wears hearing aids in both ears and has a large amount of wax occluding both ear canals.  However patient is not cooperative enough to allow cleaning in the office. Nasal: External nose without lesions. Septum mild deformity with mild rhinitis.. Clear nasal passages.  No gross mucopurulent discharge noted. Oral: Lips and gums without lesions. Tongue and palate mucosa without lesions. Posterior oropharynx clear. Neck: No palpable adenopathy or masses Respiratory: Breathing comfortably  Skin: No facial/neck lesions or rash noted.  Procedures  Assessment: Bilateral cerumen impactions  Plan: This will have to be cleaned under anesthesia as patient is not cooperative enough to allow cleaning in the office.  Radene Journey, MD

## 2019-10-31 NOTE — Telephone Encounter (Signed)
Patients wife called into the office stating that patient went to appt today to have his ears checked. Wife thought that his hearing was going bad and found out he had a lot of wax build up inside the ear. Patient was referred right away to ENT and patient wasn't able to stay still to have wax removed. They have scheduled to sedated him to removal the wax. She is wanting to bring him in office with Dr. Anitra Lauth to have it removed, she wants too give him risperiDONE (RISPERDAL) 0.5 MG tablet ( instead of giving him 1 quarter, wife wants to give him a whole pill) so he can sit and have it removed without having to be sedated. She doesn't want that for him because of his age.    Please call and advise

## 2019-10-31 NOTE — Telephone Encounter (Signed)
I'll try, but tell her if he is combative then I won't be able to even try to do it b/c I could end up doing more damage than good.  At that point they'll either have to choose to just leave the ears alone b/c it is not a harmful condition (just affects his ability to hear) or proceed with the ENTs plan of sedating him in order to remove it. -let me know

## 2019-11-01 NOTE — Telephone Encounter (Signed)
Patient's wife returned call while at lunch. Advised she would be contacted once available, tried calling her back twice and left another voicemail.

## 2019-11-01 NOTE — Telephone Encounter (Signed)
Noted  

## 2019-11-01 NOTE — Telephone Encounter (Signed)
Pt's wife, Deatra James said she would give him the risperidone to help calm him but would prefer not to sedate him. They will still be coming in the morning for wax removal

## 2019-11-01 NOTE — Telephone Encounter (Signed)
LM for pt to returncall

## 2019-11-01 NOTE — Telephone Encounter (Signed)
Tried calling patient again, went straight to VM

## 2019-11-02 ENCOUNTER — Other Ambulatory Visit: Payer: Self-pay

## 2019-11-02 ENCOUNTER — Ambulatory Visit (INDEPENDENT_AMBULATORY_CARE_PROVIDER_SITE_OTHER): Payer: Medicare PPO | Admitting: Family Medicine

## 2019-11-02 ENCOUNTER — Ambulatory Visit (INDEPENDENT_AMBULATORY_CARE_PROVIDER_SITE_OTHER): Payer: Self-pay | Admitting: Otolaryngology

## 2019-11-02 ENCOUNTER — Encounter: Payer: Self-pay | Admitting: Family Medicine

## 2019-11-02 VITALS — BP 107/66 | HR 80 | Temp 97.6°F | Resp 16 | Ht 67.0 in | Wt 145.2 lb

## 2019-11-02 DIAGNOSIS — H6123 Impacted cerumen, bilateral: Secondary | ICD-10-CM

## 2019-11-02 NOTE — H&P (Signed)
PREOPERATIVE H&P  Chief Complaint: Bilateral cerumen impactions  HPI: Mitchell Abbott is a 86 y.o. male who presents for evaluation of bilateral cerumen impactions.  Patient was unable to hold still in the office in order to clean the ear canals.  Past Medical History:  Diagnosis Date  . Actinic keratoses   . Adrenal hemorrhage ([place]) 2001   s/p fall from ladder--a f/u CT abd showed resolution of the hemorrhage- (duodenal polyp incidentally noted;  f/u EGD by Dr. Watt Climes 03/2000 showed small lipomatous mass in duodenum which was confirmed by biopsy.  . Alzheimer's dementia ([place])    with behav disturbance and apathy.  [facility] referral as of [facility].  They added namenda 07/2014 due to pt's signif decline in the prior 6 mo.  . Arthralgia of knee, right 2009   exam suspicious for meniscal origin  . BPH (benign prostatic hypertrophy)    History of acute urinary retention (2006)  Hx of elevated PSA (7.26) + right sided prostate nodule on DRE by urologist.  PSA did not go down with tx with antibiotics--biopsy was done and showed NO MALIGNANCY but some chronic inflammation was present--turned out related to acute cystitis (enterococcus UTI).  PSA was 1.17 in 07/2011 at urologist's (Dr. Karsten Ro).  F/u at urol is now prn as of 08/2014.  Marland Kitchen BPPV (benign paroxysmal positional vertigo)   . DDD (degenerative disc disease), cervical    C5-6, C6-7 primarily  . Headache in back of head    Neurologist treated him with PT for this problem and it helped  . Hearing deficit    Bilateral; noise damage in [employer]  Has had hearing aids since about 2008.  Marland Kitchen Herpes zoster 07/25/2018   suspected--valtrex rx'd.  Marland Kitchen History of acute cystitis 08/2009   with acute prostatitis (enterococcus faecalis)  . History of solitary pulmonary nodule    Initially noted on CT abd for his adrenal hemorrhage s/p fall from ladder 2001.  F/u CT chest 04/2000 showed that it had shrunk from 63mm to 66mm and had no malignant  features.  . History of TIA (transient ischemic attack) @ 2008   02/2001 and 01/2006 carotid dopplers showed 50% or less stenosis.  . Left bundle branch hemiblock    mentioned in dx of old records 04/15/2009  . Migraine headache with aura onset in teens   No migraines since about 2004  . Poor dentition    As of 07/2019 being referred to oral surgeon for removal of all implants and all remaining teeth  . Skin cancer    Past Surgical History:  Procedure Laterality Date  . Carotid dopplers  05/2016   1-39% stenosis bilat ICA.  . cataract extrac    . dental     implants  . PROSTATE BIOPSY  04/07/2005   For elevated PSA + right sided prostate nodule on DRE by urologist:  benign biopsy.  . TONSILLECTOMY  preteen  . TRANSTHORACIC ECHOCARDIOGRAM  06/28/2016   EF 60-65%, mild dilatation of ascending aorta.   Social History   Socioeconomic History  . Marital status: Married    Spouse name: Not on file  . Number of children: 4  . Years of education: PHD  . Highest education level: Not on file  Occupational History  . Not on file  Tobacco Use  . Smoking status: Former Smoker    Years: 2.00    Types: Cigarettes  . Smokeless tobacco: Never Used  Substance and Sexual Activity  . Alcohol use: Yes  Alcohol/week: 1.0 standard drinks    Types: 1 Cans of beer per week    Comment: "a good drink every day" BEER/WINE  . Drug use: No  . Sexual activity: Not on file  Other Topics Concern  . 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   Lives in [place].   Orig from [place], relocated to [place] in late 1970s.   Has 3 children from first marriage, one from second marriage.   One daughter is an OB/GYN in [place], [place].   Distant hx of smoking cigs, quit 1974, drinks a whisky drink qod.   Exercise: 3 X /week goes to "the club" and does nautilus and walking machine,  also swims 3 x/week.   Social Determinants of Health   Financial Resource Strain:   . Difficulty of Paying Living Expenses:   Food Insecurity:   . Worried About Charity fundraiser in the Last Year:   . Arboriculturist in the Last Year:   Transportation Needs:   . Film/video editor (Medical):   Marland Kitchen Lack of Transportation (Non-Medical):   Physical Activity:   . Days of Exercise per Week:   . Minutes of Exercise per Session:   Stress:   . Feeling of Stress :   Social Connections:   . Frequency of Communication with Friends and Family:   . Frequency of Social Gatherings with Friends and Family:   . Attends Religious Services:   . Active Member of Clubs or Organizations:   . Attends Archivist Meetings:   Marland Kitchen Marital Status:    Family History  Problem Relation Age of Onset  . Sudden death Mother 53       FH info obtained from Dr. Ovid Curd records.  . Breast cancer Daughter   . Migraines Daughter    No Known Allergies Prior to Admission medications   Medication Sig Start Date End Date Taking? Authorizing Provider  aspirin 325 MG EC tablet Take 1 tablet (325 mg total) by mouth daily. Patient taking differently: Take 325 mg by mouth daily. Pt takes 1/2 tab of 325mg . 10/20/12   Marcial Pacas, MD  furosemide (LASIX) 20 MG tablet TAKE 1 TABLET BY MOUTH EVERY DAY AS NEEDED FOR WORSENING OF EXTREMITY SWELLING 10/30/18   McGowen, Adrian Blackwater, MD  mupirocin ointment (BACTROBAN) 2 %  07/28/18   [provider]  risperiDONE (RISPERDAL) 0.5 MG tablet Take 0.125-0.25 mg by mouth See admin instructions. 1/4 tablet in am and 1/2 tablet pm    [provider]  rivastigmine (EXELON) 13.3 MG/24HR Place 13.3 mg onto the skin daily.    [provider]  tamsulosin (FLOMAX) 0.4 MG CAPS capsule Take 2 capsules (0.8 mg total) by mouth daily after supper. Patient taking differently: Take 0.4 mg by mouth daily after supper.  08/11/16   McGowen, Adrian Blackwater, MD  terbinafine (LAMISIL)  1 % cream  12/21/18   [provider]  Vitamins A & D (VITAMIN A & D) ointment Apply 1 application topically 2 (two) times daily.    [provider]     Positive ROS: Otherwise negative  All other systems have been reviewed and were otherwise negative with the exception of those mentioned in the HPI and as above.  Physical Exam: There were no vitals filed for this visit.  General: Alert, no acute  distress Oral: Normal oral mucosa and tonsils Nasal: Clear nasal passages Neck: No palpable adenopathy or thyroid nodules Ear: Ear canals with cerumen obstructing both ear canals. Cardiovascular: Regular rate and rhythm, no murmur.  Respiratory: Clear to auscultation Neurologic: Alert and oriented x 3   Assessment/Plan: Bilateral cerumen impactions  Plan for removal of cerumen impactions under general anesthesia.   Melony Overly, MD 11/02/2019 5:59 PM

## 2019-11-02 NOTE — Progress Notes (Signed)
OFFICE VISIT  11/02/2019   CC:  Chief Complaint  Patient presents with  . Cerumen Impaction   HPI:    Patient is a 86 y.o. Caucasian male with advanced dementia with behavioral disturbance who presents accompanied by his wife [name] (24/7 caregiver) for excessive cerumen in [facility] and wife would like me to try to remove it today. Audiologist eval-->too much cerumen to see TMs. Referred to ENT, decided he had to have cerumen disimpaction procedure under sedation. I decided to try to extract the cerumen today before resorting to the ENT procedure. His wife gave him a whole risperdone tab last night to try to make him as calm as possible. He does wear hearing aids.  Past Medical History:  Diagnosis Date  . Actinic keratoses   . Adrenal hemorrhage ([place]) 2001   s/p fall from ladder--a f/u CT abd showed resolution of the hemorrhage- (duodenal polyp incidentally noted;  f/u EGD by Dr. Watt Climes 03/2000 showed small lipomatous mass in duodenum which was confirmed by biopsy.  . Alzheimer's dementia ([place])    with behav disturbance and apathy.  [facility] referral as of 07/2013.  They added namenda 07/2014 due to pt's signif decline in the prior 6 mo.  . Arthralgia of knee, right 2009   exam suspicious for meniscal origin  . BPH (benign prostatic hypertrophy)    History of acute urinary retention (2006)  Hx of elevated PSA (7.26) + right sided prostate nodule on DRE by urologist.  PSA did not go down with tx with antibiotics--biopsy was done and showed NO MALIGNANCY but some chronic inflammation was present--turned out related to acute cystitis (enterococcus UTI).  PSA was 1.17 in 07/2011 at urologist's (Dr. Karsten Ro).  F/u at urol is now prn as of 08/2014.  Marland Kitchen BPPV (benign paroxysmal positional vertigo)   . DDD (degenerative disc disease), cervical    C5-6, C6-7 primarily  . Headache in back of head    Neurologist treated him with PT for this problem and it helped  . Hearing deficit    Bilateral;  noise damage in [employer]  Has had hearing aids since about 2008.  Marland Kitchen Herpes zoster 07/25/2018   suspected--valtrex rx'd.  Marland Kitchen History of acute cystitis 08/2009   with acute prostatitis (enterococcus faecalis)  . History of solitary pulmonary nodule    Initially noted on CT abd for his adrenal hemorrhage s/p fall from ladder 2001.  F/u CT chest 04/2000 showed that it had shrunk from 27mm to 8mm and had no malignant features.  . History of TIA (transient ischemic attack) @ 2008   02/2001 and 01/2006 carotid dopplers showed 50% or less stenosis.  . Left bundle branch hemiblock    mentioned in dx of old records 04/15/2009  . Migraine headache with aura onset in teens   No migraines since about 2004  . Poor dentition    As of 07/2019 being referred to oral surgeon for removal of all implants and all remaining teeth  . Skin cancer     Past Surgical History:  Procedure Laterality Date  . Carotid dopplers  05/2016   1-39% stenosis bilat ICA.  . cataract extrac    . dental     implants  . PROSTATE BIOPSY  04/07/2005   For elevated PSA + right sided prostate nodule on DRE by urologist:  benign biopsy.  . TONSILLECTOMY  preteen  . TRANSTHORACIC ECHOCARDIOGRAM  06/28/2016   EF 60-65%, mild dilatation of ascending aorta.    Outpatient Medications  Prior to Visit  Medication Sig Dispense Refill  . aspirin 325 MG EC tablet Take 1 tablet (325 mg total) by mouth daily. (Patient taking differently: Take 325 mg by mouth daily. Pt takes 1/2 tab of 325mg .) 90 tablet 3  . furosemide (LASIX) 20 MG tablet TAKE 1 TABLET BY MOUTH EVERY DAY AS NEEDED FOR WORSENING OF EXTREMITY SWELLING 90 tablet 1  . mupirocin ointment (BACTROBAN) 2 %     . risperiDONE (RISPERDAL) 0.5 MG tablet Take 0.125-0.25 mg by mouth See admin instructions. 1/4 tablet in am and 1/2 tablet pm    . rivastigmine (EXELON) 13.3 MG/24HR Place 13.3 mg onto the skin daily.    . tamsulosin (FLOMAX) 0.4 MG CAPS capsule Take 2 capsules (0.8 mg  total) by mouth daily after supper. (Patient taking differently: Take 0.4 mg by mouth daily after supper. ) 180 capsule 3  . terbinafine (LAMISIL) 1 % cream     . Vitamins A & D (VITAMIN A & D) ointment Apply 1 application topically 2 (two) times daily.     No facility-administered medications prior to visit.    No Known Allergies  ROS As per HPI  PE: Vitals with BMI 11/02/2019 08/27/2019 08/24/2019  Height 5\' 7"  5\' 7"  -  Weight 145 lbs 4 oz 147 lbs 6 oz -  BMI 00.71 21.97 -  Systolic 588 325 498  Diastolic 66 78 72  Pulse 80 59 62    Gen: Alert, well appearing.  Patient is oriented to person, place, time, and situation. He is sedated but responds to gentle nudge and loud voice directly in R ear. He does not talk.  I attempted to look in L ear with speculum but could not even get him to cooperate enough to get the speculum into EAC. I aborted further attempts at that time.  LABS:    Chemistry      Component Value Date/Time   NA 138 08/24/2019 1800   K 5.2 (H) 08/24/2019 1800   CL 103 08/24/2019 1800   CO2 24 08/24/2019 1800   BUN 23 08/24/2019 1800   CREATININE 1.11 08/24/2019 1800   CREATININE 1.02 09/05/2015 1528      Component Value Date/Time   CALCIUM 9.2 08/24/2019 1800   ALKPHOS 64 06/28/2018 1151   AST 22 06/28/2018 1151   ALT 16 06/28/2018 1151   BILITOT 0.7 06/28/2018 1151     IMPRESSION AND PLAN:  Bilat cerumen impaction: unable to get a view in EAC's today b/c pt too uncooperative. I recommended against getting the procedure with ENT that was offered b/c this is too extreme for this benign problem. I encouraged [name] to try bid otc debrox drops and see how it goes over time.  An After Visit Summary was printed and given to the patient.  FOLLOW UP: Return if symptoms worsen or fail to improve.  Signed:  Crissie Sickles, MD           11/02/2019

## 2019-11-05 ENCOUNTER — Other Ambulatory Visit (HOSPITAL_COMMUNITY): Payer: Medicare PPO

## 2019-11-08 ENCOUNTER — Encounter (HOSPITAL_BASED_OUTPATIENT_CLINIC_OR_DEPARTMENT_OTHER): Admission: RE | Payer: Self-pay | Source: Home / Self Care

## 2019-11-08 ENCOUNTER — Ambulatory Visit (HOSPITAL_BASED_OUTPATIENT_CLINIC_OR_DEPARTMENT_OTHER): Admission: RE | Admit: 2019-11-08 | Payer: Medicare PPO | Source: Home / Self Care | Admitting: Otolaryngology

## 2019-11-08 SURGERY — REMOVAL, CERUMEN, IMPACTED
Anesthesia: General | Laterality: Bilateral

## 2020-01-28 ENCOUNTER — Ambulatory Visit (INDEPENDENT_AMBULATORY_CARE_PROVIDER_SITE_OTHER): Payer: Medicare PPO | Admitting: Family Medicine

## 2020-01-28 ENCOUNTER — Other Ambulatory Visit: Payer: Self-pay

## 2020-01-28 ENCOUNTER — Encounter: Payer: Self-pay | Admitting: Family Medicine

## 2020-01-28 VITALS — BP 129/79 | HR 58 | Temp 97.9°F | Resp 16 | Ht 67.0 in | Wt 145.0 lb

## 2020-01-28 DIAGNOSIS — L89311 Pressure ulcer of right buttock, stage 1: Secondary | ICD-10-CM | POA: Diagnosis not present

## 2020-01-28 DIAGNOSIS — R21 Rash and other nonspecific skin eruption: Secondary | ICD-10-CM

## 2020-01-28 DIAGNOSIS — B372 Candidiasis of skin and nail: Secondary | ICD-10-CM | POA: Diagnosis not present

## 2020-01-28 MED ORDER — FLUCONAZOLE 150 MG PO TABS: ORAL_TABLET | ORAL | 0 refills | Status: DC

## 2020-01-28 MED ORDER — FLUTICASONE PROPIONATE 0.05 % EX CREA: TOPICAL_CREAM | Freq: Two times a day (BID) | CUTANEOUS | 0 refills | Status: DC

## 2020-01-28 NOTE — Progress Notes (Signed)
OFFICE VISIT  01/28/2020   CC:  Chief Complaint  Patient presents with  . Rash    located on back, x9months but has worsened   HPI:    Patient is a 86 y.o. Caucasian male with advanced dementia who presents accompanied by his wife [name] for evaluation of a rash.  For weeks wife has noted bumpy red rash to L scapula region, about softball sized, no imp with application of bactroban lately. Also small oval area about 2 cm size in L upper glut region. GU intertrigo rash chronic, seems worse last month or so, she is applying buttpaste faithfully but his adult daycare may not be as diligent with this.  No fevers, no malaise, no scratching.  Past Medical History:  Diagnosis Date  . Actinic keratoses   . Adrenal hemorrhage ([place]) 2001   s/p fall from ladder--a f/u CT abd showed resolution of the hemorrhage- (duodenal polyp incidentally noted;  f/u EGD by Dr. Watt Climes 03/2000 showed small lipomatous mass in duodenum which was confirmed by biopsy.  . Alzheimer's dementia ([place])    with behav disturbance and apathy.  [facility] referral as of 07/2013.  They added namenda 07/2014 due to pt's signif decline in the prior 6 mo.  . Arthralgia of knee, right 2009   exam suspicious for meniscal origin  . BPH (benign prostatic hypertrophy)    History of acute urinary retention (2006)  Hx of elevated PSA (7.26) + right sided prostate nodule on DRE by urologist.  PSA did not go down with tx with antibiotics--biopsy was done and showed NO MALIGNANCY but some chronic inflammation was present--turned out related to acute cystitis (enterococcus UTI).  PSA was 1.17 in 07/2011 at urologist's (Dr. Karsten Ro).  F/u at urol is now prn as of 08/2014.  Marland Kitchen BPPV (benign paroxysmal positional vertigo)   . DDD (degenerative disc disease), cervical    C5-6, C6-7 primarily  . Headache in back of head    Neurologist treated him with PT for this problem and it helped  . Hearing deficit    Bilateral; noise damage in  [employer]  Has had hearing aids since about 2008.  Marland Kitchen Herpes zoster 07/25/2018   suspected--valtrex rx'd.  Marland Kitchen History of acute cystitis 08/2009   with acute prostatitis (enterococcus faecalis)  . History of solitary pulmonary nodule    Initially noted on CT abd for his adrenal hemorrhage s/p fall from ladder 2001.  F/u CT chest 04/2000 showed that it had shrunk from 29mm to 66mm and had no malignant features.  . History of TIA (transient ischemic attack) @ 2008   02/2001 and 01/2006 carotid dopplers showed 50% or less stenosis.  . Left bundle branch hemiblock    mentioned in dx of old records 04/15/2009  . Migraine headache with aura onset in teens   No migraines since about 2004  . Poor dentition    As of 07/2019 being referred to oral surgeon for removal of all implants and all remaining teeth  . Skin cancer     Past Surgical History:  Procedure Laterality Date  . Carotid dopplers  05/2016   1-39% stenosis bilat ICA.  . cataract extrac    . dental     implants  . PROSTATE BIOPSY  04/07/2005   For elevated PSA + right sided prostate nodule on DRE by urologist:  benign biopsy.  . TONSILLECTOMY  preteen  . TRANSTHORACIC ECHOCARDIOGRAM  06/28/2016   EF 60-65%, mild dilatation of ascending aorta.  Outpatient Medications Prior to Visit  Medication Sig Dispense Refill  . aspirin 325 MG EC tablet Take 1 tablet (325 mg total) by mouth daily. (Patient taking differently: Take 325 mg by mouth daily. Pt takes 1/2 tab of 325mg .) 90 tablet 3  . mupirocin ointment (BACTROBAN) 2 %     . risperiDONE (RISPERDAL) 0.5 MG tablet Take 0.125-0.25 mg by mouth See admin instructions. 1/4 tablet in am and 1/2 tablet pm    . rivastigmine (EXELON) 13.3 MG/24HR Place 13.3 mg onto the skin daily.    . tamsulosin (FLOMAX) 0.4 MG CAPS capsule Take 2 capsules (0.8 mg total) by mouth daily after supper. (Patient taking differently: Take 0.4 mg by mouth daily after supper. ) 180 capsule 3  . terbinafine (LAMISIL)  1 % cream     . Vitamins A & D (VITAMIN A & D) ointment Apply 1 application topically 2 (two) times daily.    . furosemide (LASIX) 20 MG tablet TAKE 1 TABLET BY MOUTH EVERY DAY AS NEEDED FOR WORSENING OF EXTREMITY SWELLING (Patient not taking: Reported on 01/28/2020) 90 tablet 1   No facility-administered medications prior to visit.    No Known Allergies  ROS As per HPI  PE: Blood pressure 129/79, pulse (!) 58, temperature 97.9 F (36.6 C), temperature source Oral, resp. rate 16, height 5\' 7"  (1.702 m), weight 145 lb (65.8 kg), SpO2 97 %. Gen: Alert, well appearing.   L upper back region with softball sized area of maculopapular rash, deep pinkish color, edges not well demarcated. L upper glut with stage I/II pressure ulcer about 2 cm diameter, not erythematous. GU region with extensive violaceous, erythematous rash, well demarcated borders and spotty areas of maceration.   LABS:    Chemistry      Component Value Date/Time   NA 138 08/24/2019 1800   K 5.2 (H) 08/24/2019 1800   CL 103 08/24/2019 1800   CO2 24 08/24/2019 1800   BUN 23 08/24/2019 1800   CREATININE 1.11 08/24/2019 1800   CREATININE 1.02 09/05/2015 1528      Component Value Date/Time   CALCIUM 9.2 08/24/2019 1800   ALKPHOS 64 06/28/2018 1151   AST 22 06/28/2018 1151   ALT 16 06/28/2018 1151   BILITOT 0.7 06/28/2018 1151      IMPRESSION AND PLAN:  1) Inflammatory rash unknown etiology, L scapula area. Apply cutivate cream bid.  2) Severe candida, intertrigo GU region. Diflucan 150mg  qd x 14d. Continue buttpaste.  3) Offloading of L glut region recommended as best he can. May continue to apply bactroban ointment for now, although it does not appear infected (she says it is getting better with this ointment).  An After Visit Summary was printed and given to the patient.  FOLLOW UP: No follow-ups on file.  Signed:  Crissie Sickles, MD           01/28/2020

## 2020-01-30 ENCOUNTER — Telehealth: Payer: Self-pay

## 2020-01-30 DIAGNOSIS — J205 Acute bronchitis due to respiratory syncytial virus: Secondary | ICD-10-CM

## 2020-01-30 HISTORY — DX: Acute bronchitis due to respiratory syncytial virus: J20.5

## 2020-01-30 NOTE — Telephone Encounter (Signed)
Wife calling about patient (Mitchell Abbott) said that he lost consciousness earlier today but she didn't call 911.  She said she waited a few minutes and he came back.   She also stated that diaper rash is still there, but she hasnt started putting cream on it. I asked if the cream had helped. She stated she hasnt even picked up the prescription up for it.  Please call [name]-wife [phone]

## 2020-01-31 NOTE — Telephone Encounter (Signed)
Spoke with patient's wife, Deatra James and advised if patient fainted again to call 911. She still has not picked up medication and cream because she was not aware it had been called in and ready for pick up. Advised to call the pharmacy as soon as they open to check order status.

## 2020-02-05 ENCOUNTER — Telehealth: Payer: Self-pay

## 2020-02-05 DIAGNOSIS — Z20828 Contact with and (suspected) exposure to other viral communicable diseases: Secondary | ICD-10-CM | POA: Diagnosis not present

## 2020-02-05 NOTE — Telephone Encounter (Signed)
Attempted to contact patients wife, no answer.   [facility] - Client TELEPHONE ADVICE RECORD [facility] Patient Name: Mitchell Abbott Gender: Male DOB: 1924/04/27 Age: 86 Y 17 M 16 D Return Phone Number: [phone] (Primary) Address: City/State/Zip: [place] [address] Client [facility] - Client Client Site [facility] Day Physician Crissie Sickles - MD Contact Type Call Who Is Calling Patient / Member / Family / Caregiver Call Type Triage / Clinical Caller Name Gabriel Rainwater Relationship To Patient Spouse Return Phone Number [phone] (Primary) Chief Complaint Cough Reason for Call Symptomatic / Request for Health Information Initial Comment Caller's husband has a cough and fever of 99.9. She called 911 and a paramedic checked his vitals. Translation No Nurse Assessment Nurse: Derrel Nip, RN, Santiago Glad Date/Time Eilene Ghazi Time): 02/04/2020 10:42:16 AM Confirm and document reason for call. If symptomatic, describe symptoms. ---Caller states that yesterday he started to cough and a temp 99.9scan/oral he was pink skin/flushed - she called 911 - his O2 was 97% BP 170/90 and went to 140/70 he is end stage Alzheimer Has the patient had close contact with a person known or suspected to have the novel coronavirus illness OR traveled / lives in area with major community spread (including international travel) in the last 14 days from the onset of symptoms? * If Asymptomatic, screen for exposure and travel within the last 14 days. ---No Does the patient have any new or worsening symptoms? ---No Please document clinical information provided and list any resource used. ---No new symptoms - she is calling for information Disp. Time Eilene Ghazi Time) Disposition Final User 02/04/2020 10:49:15 AM Clinical Call Yes Derrel Nip, RN, York Pellant Disagree/Comply Comply Caller Understands Yes PreDisposition Call Doctor

## 2020-02-05 NOTE — Telephone Encounter (Signed)
Noted  

## 2020-02-06 NOTE — Telephone Encounter (Signed)
Even if covid pos: treat cough with mucinex DM over the counter. Hydrate well, rest.  Treat any temp >100 with 500 - 1000 mg tylenol every 6 hours as needed. Watch for onset of any shortness of breath or n/v/d-->Seek medical care in ED if any of these things happen. Stay in home for the next 10-14d as long as he is getting better.

## 2020-02-06 NOTE — Telephone Encounter (Signed)
LM for pt to returncall

## 2020-02-06 NOTE — Telephone Encounter (Signed)
Spoke with wife regarding symptoms.  She reports patient has cough and temperature since Sunday (02/03/2020). However, she thinks her thermometer is unreliable d/t multiple different readings. Patient is warm to touch. She has been alternating Tylenol and ASA every 6 hours. Food and fluid intake adequate, no other issues.   Patient was tested for COVID in [place] on 02/05/2020, results pending. Per wife, should have results today or tomorrow.   Wife would like to know next steps.

## 2020-02-06 NOTE — Telephone Encounter (Signed)
Patient's wife was advised of recommendations.

## 2020-02-07 NOTE — Telephone Encounter (Signed)
FYI  Please see below

## 2020-02-07 NOTE — Telephone Encounter (Signed)
Noted  

## 2020-02-07 NOTE — Telephone Encounter (Signed)
Patient's wife called back to advise that patient is Covid negative

## 2020-02-12 DIAGNOSIS — Z20822 Contact with and (suspected) exposure to covid-19: Secondary | ICD-10-CM | POA: Diagnosis not present

## 2020-02-13 ENCOUNTER — Telehealth: Payer: Self-pay | Admitting: Family Medicine

## 2020-02-13 NOTE — Telephone Encounter (Signed)
Please advise 

## 2020-02-13 NOTE — Telephone Encounter (Signed)
Patient's wants to know if he can take his wife's Benzonatate 200mg  for his cough. He has been taking the Mucinex x 10 days but it has not been helping. Also has concerns about taking acetaminophen long term. Please call patient to advise. Patient's wife also has questions about having [name] seen again to have his lungs checked.

## 2020-02-14 ENCOUNTER — Other Ambulatory Visit: Payer: Self-pay

## 2020-02-14 ENCOUNTER — Encounter: Payer: Self-pay | Admitting: Family Medicine

## 2020-02-14 ENCOUNTER — Ambulatory Visit (INDEPENDENT_AMBULATORY_CARE_PROVIDER_SITE_OTHER): Payer: Medicare PPO

## 2020-02-14 ENCOUNTER — Telehealth (INDEPENDENT_AMBULATORY_CARE_PROVIDER_SITE_OTHER): Payer: Medicare PPO | Admitting: Family Medicine

## 2020-02-14 VITALS — Temp 98.6°F | Wt 137.0 lb

## 2020-02-14 DIAGNOSIS — Z20828 Contact with and (suspected) exposure to other viral communicable diseases: Secondary | ICD-10-CM | POA: Diagnosis not present

## 2020-02-14 DIAGNOSIS — J209 Acute bronchitis, unspecified: Secondary | ICD-10-CM

## 2020-02-14 DIAGNOSIS — J019 Acute sinusitis, unspecified: Secondary | ICD-10-CM

## 2020-02-14 DIAGNOSIS — R5081 Fever presenting with conditions classified elsewhere: Secondary | ICD-10-CM

## 2020-02-14 LAB — POCT INFLUENZA A/B
Influenza A, POC: NEGATIVE
Influenza B, POC: NEGATIVE

## 2020-02-14 MED ORDER — PREDNISONE 20 MG PO TABS: ORAL_TABLET | ORAL | 0 refills | Status: DC

## 2020-02-14 MED ORDER — DOXYCYCLINE HYCLATE 100 MG PO CAPS: 100.0000 mg | ORAL_CAPSULE | Freq: Two times a day (BID) | ORAL | 0 refills | Status: AC

## 2020-02-14 MED ORDER — BENZONATATE 100 MG PO CAPS: ORAL_CAPSULE | ORAL | 0 refills | Status: DC

## 2020-02-14 NOTE — Telephone Encounter (Signed)
Patient is on the schedule this afternoon for virtual appt.

## 2020-02-14 NOTE — Telephone Encounter (Signed)
OK to make appt (work in not necessary) for recheck. OK to take benzonatate. Taking acetaminophen long term is fine.

## 2020-02-14 NOTE — Progress Notes (Signed)
Virtual Visit via Video Note  I connected with Mitchell Abbott  on 02/14/20 at  4:00 PM EDT by telephone (a video enabled telemedicine application was attempted but too many technical difficulties occurred so this was aborted) and verified that I am speaking with the correct person using two identifiers.  Location patient: home Location provider:work or home office Persons participating in the virtual visit: patient, provider  I discussed the limitations of evaluation and management by telemedicine and the availability of in person appointments. The patient expressed understanding and agreed to proceed.  Telemedicine visit is a necessity given the COVID-19 restrictions in place at the current time.  HPI: 86 y/o WM with whom I am doing a telephone visit today (due to COVID-19 pandemic restrictions) for respiratory symptoms.  He has severe dementia and is nonverbal. His wife [name] gives all of the history. About 2 wks days of lots of nasal mucous, PND, coughing.  Some wheezing intermittently.  He is not good at spitting out the mucous.  No SOB.  [name] called 911 due to fever and severe coughing spell.  He was fine by the time EMS evaluated him, told wife his lungs were clear. T 98.6 up to 102. Giving tylenol and mucinex.  Benzonatate helped some. Eating and drinking well.  Urinating and BMs normal.  Wife with similar resp illness lately. Adult daycare that he attends has apparently had cases of RSV. He had a covid test via [facility] on 02/12/20 and it returned negative today.  ROS: no CP, no SOB, o dizziness, no HAs, no rashes, no melena/hematochezia.  No polyuria or polydipsia.  No myalgias or arthralgias.  No focal weakness, paresthesias, or tremors.  No acute vision or hearing abnormalities. No n/v/d or abd pain.    Past Medical History:  Diagnosis Date  . Actinic keratoses   . Adrenal hemorrhage ([place]) 2001   s/p fall from ladder--a f/u CT abd showed resolution of the hemorrhage- (duodenal polyp  incidentally noted;  f/u EGD by Dr. Watt Climes 03/2000 showed small lipomatous mass in duodenum which was confirmed by biopsy.  . Alzheimer's dementia ([place])    with behav disturbance and apathy.  [facility] referral as of [facility].  They added namenda 07/2014 due to Mitchell Abbott's signif decline in the prior 6 mo.  . Arthralgia of knee, right 2009   exam suspicious for meniscal origin  . BPH (benign prostatic hypertrophy)    History of acute urinary retention (2006)  Hx of elevated PSA (7.26) + right sided prostate nodule on DRE by urologist.  PSA did not go down with tx with antibiotics--biopsy was done and showed NO MALIGNANCY but some chronic inflammation was present--turned out related to acute cystitis (enterococcus UTI).  PSA was 1.17 in 07/2011 at urologist's (Dr. Karsten Ro).  F/u at urol is now prn as of 08/2014.  Marland Kitchen BPPV (benign paroxysmal positional vertigo)   . DDD (degenerative disc disease), cervical    C5-6, C6-7 primarily  . Headache in back of head    Neurologist treated him with Mitchell Abbott for this problem and it helped  . Hearing deficit    Bilateral; noise damage in [employer]  Has had hearing aids since about 2008.  Marland Kitchen Herpes zoster 07/25/2018   suspected--valtrex rx'd.  Marland Kitchen History of acute cystitis 08/2009   with acute prostatitis (enterococcus faecalis)  . History of solitary pulmonary nodule    Initially noted on CT abd for his adrenal hemorrhage s/p fall from ladder 2001.  F/u CT chest 04/2000 showed  that it had shrunk from 37mm to 11mm and had no malignant features.  . History of TIA (transient ischemic attack) @ 2008   02/2001 and 01/2006 carotid dopplers showed 50% or less stenosis.  . Left bundle branch hemiblock    mentioned in dx of old records 04/15/2009  . Migraine headache with aura onset in teens   No migraines since about 2004  . Poor dentition    As of 07/2019 being referred to oral surgeon for removal of all implants and all remaining teeth  . Skin cancer     Past Surgical  History:  Procedure Laterality Date  . Carotid dopplers  05/2016   1-39% stenosis bilat ICA.  . cataract extrac    . dental     implants  . PROSTATE BIOPSY  04/07/2005   For elevated PSA + right sided prostate nodule on DRE by urologist:  benign biopsy.  . TONSILLECTOMY  preteen  . TRANSTHORACIC ECHOCARDIOGRAM  06/28/2016   EF 60-65%, mild dilatation of ascending aorta.    Family History  Problem Relation Age of Onset  . Sudden death Mother 53       FH info obtained from Dr. Ovid Curd records.  . Breast cancer Daughter   . Migraines Daughter      Current Outpatient Medications:  .  aspirin 325 MG EC tablet, Take 1 tablet (325 mg total) by mouth daily. (Patient taking differently: Take 325 mg by mouth daily. Mitchell Abbott takes 1/2 tab of 325mg .), Disp: 90 tablet, Rfl: 3 .  fluconazole (DIFLUCAN) 150 MG tablet, 1 tab po qd x 14d, Disp: 14 tablet, Rfl: 0 .  fluticasone (CUTIVATE) 0.05 % cream, Apply topically 2 (two) times daily. Apply to upper back rash, Disp: 30 g, Rfl: 0 .  furosemide (LASIX) 20 MG tablet, TAKE 1 TABLET BY MOUTH EVERY DAY AS NEEDED FOR WORSENING OF EXTREMITY SWELLING (Patient not taking: Reported on 01/28/2020), Disp: 90 tablet, Rfl: 1 .  mupirocin ointment (BACTROBAN) 2 %, , Disp: , Rfl:  .  risperiDONE (RISPERDAL) 0.5 MG tablet, Take 0.125-0.25 mg by mouth See admin instructions. 1/4 tablet in am and 1/2 tablet pm, Disp: , Rfl:  .  rivastigmine (EXELON) 13.3 MG/24HR, Place 13.3 mg onto the skin daily., Disp: , Rfl:  .  tamsulosin (FLOMAX) 0.4 MG CAPS capsule, Take 2 capsules (0.8 mg total) by mouth daily after supper. (Patient taking differently: Take 0.4 mg by mouth daily after supper. ), Disp: 180 capsule, Rfl: 3 .  terbinafine (LAMISIL) 1 % cream, , Disp: , Rfl:  .  Vitamins A & D (VITAMIN A & D) ointment, Apply 1 application topically 2 (two) times daily., Disp: , Rfl:   EXAM:  VITALS per patient if applicable:  Vitals with BMI 01/28/2020 11/02/2019 08/27/2019  Height 5'  7" 5\' 7"  5\' 7"   Weight 145 lbs 145 lbs 4 oz 147 lbs 6 oz  BMI 22.71 38.46 65.99  Systolic 357 017 793  Diastolic 79 66 78  Pulse 58 80 59     GENERAL: alert, oriented, sounds well and in no acute distress  No further exam b/c audio visit only.  LABS: none today    Chemistry      Component Value Date/Time   NA 138 08/24/2019 1800   K 5.2 (H) 08/24/2019 1800   CL 103 08/24/2019 1800   CO2 24 08/24/2019 1800   BUN 23 08/24/2019 1800   CREATININE 1.11 08/24/2019 1800   CREATININE 1.02 09/05/2015 1528  Component Value Date/Time   CALCIUM 9.2 08/24/2019 1800   ALKPHOS 64 06/28/2018 1151   AST 22 06/28/2018 1151   ALT 16 06/28/2018 1151   BILITOT 0.7 06/28/2018 1151     Lab Results  Component Value Date   WBC 10.2 08/24/2019   HGB 14.8 08/24/2019   HCT 46.5 08/24/2019   MCV 105.0 (H) 08/24/2019   PLT 315 08/24/2019    ASSESSMENT AND PLAN:  Discussed the following assessment and plan:  Severely demented 86 y/o male with upper and lower resp illness for 2 wks.  Unclear whether viral or bacterial.  Plan: Come to our office for RSV and flu swabs.  He had neg covid test 2 d/a. Sent in doxy 100 mg bid x 10d. Prednisone 40mg  qd x 5d. Benzonatate 100mg  tid prn cough. Signs/symptoms to call or return for were reviewed and Mitchell Abbott expressed understanding.  -we discussed possible serious and likely etiologies, options for evaluation and workup, limitations of telemedicine visit vs in person visit, treatment, treatment risks and precautions. Mitchell Abbott prefers to treat via telemedicine empirically rather then risking or undertaking an in person visit at this moment.   Spent 30 min with Mitchell Abbott today reviewing HPI, reviewing relevant past history, doing exam, reviewing and discussing lab and imaging data, and formulating plans.  I discussed the assessment and treatment plan with the patient. The patient was provided an opportunity to ask questions and all were answered. The patient agreed  with the plan and demonstrated an understanding of the instructions.    F/u: if not improving approp  Signed:  Crissie Sickles, MD           02/14/2020

## 2020-02-15 NOTE — Progress Notes (Signed)
It is showing open chart due to TIQ-NTM ordered. I did not order this. Please provide dx and close chart.

## 2020-02-18 ENCOUNTER — Telehealth: Payer: Self-pay | Admitting: Family Medicine

## 2020-02-18 LAB — RESPIRATORY VIRUS PANEL
Adenovirus B: NOT DETECTED
HUMAN PARAINFLU VIRUS 1: NOT DETECTED
HUMAN PARAINFLU VIRUS 2: NOT DETECTED
HUMAN PARAINFLU VIRUS 3: NOT DETECTED
INFLUENZA A SUBTYPE H1: NOT DETECTED
INFLUENZA A SUBTYPE H3: NOT DETECTED
Influenza A: NOT DETECTED
Influenza B: NOT DETECTED
Metapneumovirus: NOT DETECTED
Respiratory Syncytial Virus A: NOT DETECTED
Respiratory Syncytial Virus B: DETECTED — AB
Rhinovirus: NOT DETECTED

## 2020-02-18 LAB — TIQ-NTM

## 2020-02-18 NOTE — Telephone Encounter (Signed)
Wife calling again about RSV result.

## 2020-02-19 ENCOUNTER — Telehealth: Payer: Self-pay

## 2020-02-19 ENCOUNTER — Encounter: Payer: Self-pay | Admitting: Family Medicine

## 2020-02-19 NOTE — Telephone Encounter (Signed)
Phone note opened in error.  

## 2020-02-19 NOTE — Telephone Encounter (Signed)
Please follow up on RSV test. Patient is still coughing.

## 2020-02-19 NOTE — Telephone Encounter (Signed)
RSV has been resulted in the system as a positive RSV-B as of 02/18/20

## 2020-02-19 NOTE — Telephone Encounter (Signed)
Result note documented and sent to my inbasket to follow up with patient's wife, [name]. LM for her to return call

## 2020-02-19 NOTE — Telephone Encounter (Signed)
VAnessa pls just have wife bring him by for repeat swab.-thx

## 2020-02-23 ENCOUNTER — Telehealth (INDEPENDENT_AMBULATORY_CARE_PROVIDER_SITE_OTHER): Payer: Medicare PPO | Admitting: Family Medicine

## 2020-02-23 ENCOUNTER — Telehealth: Payer: Self-pay | Admitting: Family Medicine

## 2020-02-23 ENCOUNTER — Telehealth: Payer: Medicare PPO | Admitting: Family Medicine

## 2020-02-23 ENCOUNTER — Emergency Department (INDEPENDENT_AMBULATORY_CARE_PROVIDER_SITE_OTHER)
Admission: EM | Admit: 2020-02-23 | Discharge: 2020-02-23 | Disposition: A | Discharge status: Home / Self Care | Payer: Medicare PPO | Source: Home / Self Care | Attending: Family Medicine | Admitting: Family Medicine

## 2020-02-23 ENCOUNTER — Encounter: Payer: Self-pay | Admitting: Family Medicine

## 2020-02-23 ENCOUNTER — Other Ambulatory Visit: Payer: Self-pay

## 2020-02-23 ENCOUNTER — Encounter: Payer: Self-pay | Admitting: Emergency Medicine

## 2020-02-23 DIAGNOSIS — R220 Localized swelling, mass and lump, head: Secondary | ICD-10-CM | POA: Diagnosis not present

## 2020-02-23 DIAGNOSIS — T783XXA Angioneurotic edema, initial encounter: Secondary | ICD-10-CM | POA: Diagnosis not present

## 2020-02-23 DIAGNOSIS — R21 Rash and other nonspecific skin eruption: Secondary | ICD-10-CM

## 2020-02-23 MED ORDER — TRIAMCINOLONE ACETONIDE 0.1 % EX CREA: TOPICAL_CREAM | CUTANEOUS | 0 refills | Status: DC

## 2020-02-23 MED ORDER — PREDNISONE 10 MG PO TABS: ORAL_TABLET | ORAL | 0 refills | Status: DC

## 2020-02-23 NOTE — ED Provider Notes (Signed)
[name]    CSN: [id number] Arrival date & time: 02/23/20  1429      History   Chief Complaint Chief Complaint  Patient presents with  . Oral Swelling    HPI Mitchell Abbott is a 86 y.o. male.   Patient presents with his wife who reports that he has had swelling of his lower lip for two days.  Patient is non-verbal as a result of severe dementia.  Wife is concerned that he may also have swelling of his tongue/throat that could limit his swallowing.  He has had no respiratory distress. Reviewed previous chart notes. Patient had developed a URI on 02/03/20, having been exposed to RSV at his adult daycare.  His cough persisted and his wife sought a telemedicine visit on 02/14/20 with his PCP.  At that time he was started on doxycycline 100mg  BID for 10 days, prednisone 40mg  daily for five days, and Tessalon 100mg  TID prn. On 02/21/20 patient developed swelling of his lower lip that persisted, although he had no shortness of breath or wheezing.  His wife sought another telemedicine visit today because of concern for possible angioedema in his mouth and throat.  She was advised to proceed to ER or urgent care for further evaluation. Patient's wife also reports the presence of a rash of patient's left buttock.   The history is provided by the spouse.    Past Medical History:  Diagnosis Date  . Actinic keratoses   . Adrenal hemorrhage ([place]) 2001   s/p fall from ladder--a f/u CT abd showed resolution of the hemorrhage- (duodenal polyp incidentally noted;  f/u EGD by Dr. Watt Climes 03/2000 showed small lipomatous mass in duodenum which was confirmed by biopsy.  . Alzheimer's dementia ([place])    with behav disturbance and apathy.  [facility] referral as of 07/2013.  They added namenda 07/2014 due to pt's signif decline in the prior 6 mo.  . Arthralgia of knee, right 2009   exam suspicious for meniscal origin  . BPH (benign prostatic hypertrophy)    History of acute urinary  retention (2006)  Hx of elevated PSA (7.26) + right sided prostate nodule on DRE by urologist.  PSA did not go down with tx with antibiotics--biopsy was done and showed NO MALIGNANCY but some chronic inflammation was present--turned out related to acute cystitis (enterococcus UTI).  PSA was 1.17 in 07/2011 at urologist's (Dr. Karsten Ro).  F/u at urol is now prn as of 08/2014.  Marland Kitchen BPPV (benign paroxysmal positional vertigo)   . DDD (degenerative disc disease), cervical    C5-6, C6-7 primarily  . Headache in back of head    Neurologist treated him with PT for this problem and it helped  . Hearing deficit    Bilateral; noise damage in [employer]  Has had hearing aids since about 2008.  Marland Kitchen Herpes zoster 07/25/2018   suspected--valtrex rx'd.  Marland Kitchen History of acute cystitis 08/2009   with acute prostatitis (enterococcus faecalis)  . History of solitary pulmonary nodule    Initially noted on CT abd for his adrenal hemorrhage s/p fall from ladder 2001.  F/u CT chest 04/2000 showed that it had shrunk from 65mm to 45mm and had no malignant features.  . History of TIA (transient ischemic attack) @ 2008   02/2001 and 01/2006 carotid dopplers showed 50% or less stenosis.  . Left bundle branch hemiblock    mentioned in dx of old records 04/15/2009  . Migraine headache with aura onset in teens  No migraines since about 2004  . Poor dentition    As of 07/2019 being referred to oral surgeon for removal of all implants and all remaining teeth  . RSV bronchitis 01/2020  . Skin cancer     Patient Active Problem List   Diagnosis Date Noted  . Lip swelling 02/23/2020  . Hearing difficulty of both ears 08/21/2019  . Urethral bleeding 09/02/2018  . Rash 07/25/2018  . Syncope 06/27/2016  . Maxillary sinusitis 05/27/2015  . Apathy 01/28/2015  . Allergic rhinitis 01/20/2015  . Bacterial conjunctivitis of left eye 11/05/2014  . Alzheimer's disease with late onset ([place]) 01/15/2014  . Chronic renal insufficiency, stage  II (mild) 09/03/2013  . Observation for suspected malignant neoplasm 09/03/2013  . Preventative health care 09/03/2013  . Orthostatic syncope 02/05/2013  . Mild cognitive impairment 10/20/2012  . Cervical myofascial strain 08/15/2012  . Sacroiliac joint pain 02/18/2012  . History of TIA (transient ischemic attack)   . BPH (benign prostatic hypertrophy) 10/13/2011  . Headache disorder 10/13/2011  . Dementia ([place]) 10/13/2011    Past Surgical History:  Procedure Laterality Date  . Carotid dopplers  05/2016   1-39% stenosis bilat ICA.  . cataract extrac    . dental     implants  . PROSTATE BIOPSY  04/07/2005   For elevated PSA + right sided prostate nodule on DRE by urologist:  benign biopsy.  . TONSILLECTOMY  preteen  . TRANSTHORACIC ECHOCARDIOGRAM  06/28/2016   EF 60-65%, mild dilatation of ascending aorta.       Home Medications    Prior to Admission medications   Medication Sig Start Date End Date Taking? Authorizing Provider  aspirin 325 MG EC tablet Take 1 tablet (325 mg total) by mouth daily. Patient taking differently: Take 325 mg by mouth daily. Pt takes 1/2 tab of 325mg . 10/20/12   Marcial Pacas, MD  benzonatate (TESSALON) 100 MG capsule 1 cap po tid as needed for cough 02/14/20   McGowen, Adrian Blackwater, MD  fluconazole (DIFLUCAN) 150 MG tablet 1 tab po qd x 14d 01/28/20   McGowen, Adrian Blackwater, MD  fluticasone (CUTIVATE) 0.05 % cream Apply topically 2 (two) times daily. Apply to upper back rash 01/28/20   McGowen, Adrian Blackwater, MD  furosemide (LASIX) 20 MG tablet TAKE 1 TABLET BY MOUTH EVERY DAY AS NEEDED FOR WORSENING OF EXTREMITY SWELLING 10/30/18   McGowen, Adrian Blackwater, MD  mupirocin ointment (BACTROBAN) 2 %  07/28/18   [provider]  predniSONE (DELTASONE) 10 MG tablet Take one tab PO once daily for 5 days 02/23/20   Kandra Nicolas, MD  risperiDONE (RISPERDAL) 0.5 MG tablet Take 0.125-0.25 mg by mouth See admin instructions. 1/4 tablet in am and 1/2 tablet pm    [provider]  rivastigmine (EXELON) 13.3 MG/24HR Place 13.3 mg onto the skin daily.    [provider]  tamsulosin (FLOMAX) 0.4 MG CAPS capsule Take 2 capsules (0.8 mg total) by mouth daily after supper. Patient taking differently: Take 0.4 mg by mouth daily after supper.  08/11/16   McGowen, Adrian Blackwater, MD  terbinafine (LAMISIL) 1 % cream  12/21/18   [provider]  triamcinolone cream (KENALOG) 0.1 % Apply to rash twice daily 02/23/20   Kandra Nicolas, MD  Vitamins A & D (VITAMIN A & D) ointment Apply 1 application topically 2 (two) times daily.    [provider]    Family History Family History  Problem Relation Age of Onset  .  Sudden death Mother 80       FH info obtained from Dr. Ovid Curd records.  . Breast cancer Daughter   . Migraines Daughter     Social History Social History   Tobacco Use  . Smoking status: Former Smoker    Years: 2.00    Types: Cigarettes  . Smokeless tobacco: Never Used  Substance Use Topics  . Alcohol use: Yes    Alcohol/week: 1.0 standard drink    Types: 1 Cans of beer per week    Comment: "a good drink every day" BEER/WINE  . Drug use: No     Allergies   Patient has no known allergies.   Review of Systems Review of Systems  Constitutional: Negative for activity change, appetite change, diaphoresis, fatigue and fever.  HENT: Positive for drooling. Negative for facial swelling and rhinorrhea.        Swelling of lower lip  Eyes: Negative.   Respiratory: Negative.   Cardiovascular: Negative.   Gastrointestinal: Negative for abdominal distention, abdominal pain, diarrhea and vomiting.  Genitourinary: Negative.   Musculoskeletal: Negative.   Skin: Positive for rash.  Neurological: Negative for seizures and facial asymmetry.  Psychiatric/Behavioral: Positive for confusion.       No significant changes in patient's usual behavior     Physical Exam Triage Vital Signs ED Triage Vitals  Enc Vitals Group     BP  02/23/20 1505 117/80     Pulse --      Resp 02/23/20 1505 18     Temp --      Temp src --      SpO2 02/23/20 1505 97 %     Weight 02/23/20 1507 133 lb (60.3 kg)     Height 02/23/20 1507 5\' 7"  (1.702 m)     Head Circumference --      Peak Flow --      Pain Score 02/23/20 1507 0     Pain Loc --      Pain Edu? --      Excl. in [place]? --    No data found.  Updated Vital Signs BP 117/80 (BP Location: Right Arm)   Resp 18   Ht 5\' 7"  (1.702 m)   Wt 60.3 kg   SpO2 97%   BMI 20.83 kg/m   Visual Acuity Right Eye Distance:   Left Eye Distance:   Bilateral Distance:    Right Eye Near:   Left Eye Near:    Bilateral Near:     Physical Exam Vitals and nursing note reviewed.  Constitutional:      General: He is not in acute distress.    Appearance: He is not toxic-appearing.     Comments: Patient is confused and non-verbal but responds to commands.  HENT:     Head: Normocephalic.     Right Ear: External ear normal.     Left Ear: External ear normal.     Nose: Nose normal.     Mouth/Throat:     Mouth: Mucous membranes are moist.     Comments: Lower lip is mildly swollen and nontender to palpation.  No swelling of tongue or pharynx.  There is evidence of recent drooling on patient's shirt. Eyes:     Extraocular Movements: Extraocular movements intact.     Conjunctiva/sclera: Conjunctivae normal.     Pupils: Pupils are equal, round, and reactive to light.  Cardiovascular:     Rate and Rhythm: Normal rate.     Heart sounds: Normal heart  sounds.  Pulmonary:     Effort: No respiratory distress.     Breath sounds: Normal breath sounds. No stridor. No wheezing.  Abdominal:     Tenderness: There is no abdominal tenderness.  Musculoskeletal:     Cervical back: Neck supple.  Lymphadenopathy:     Cervical: No cervical adenopathy.  Skin:    General: Skin is warm and dry.          Comments: Left buttock has annular, irregular, slightly raised erythematous eruption without  tenderness to palpation.  Lesions are not suggestive of tinea.  Neurological:     Mental Status: He is alert.      UC Treatments / Results  Labs (all labs ordered are listed, but only abnormal results are displayed) Labs Reviewed - No data to display  EKG   Radiology No results found.  Procedures Procedures (including critical care time)  Medications Ordered in UC Medications - No data to display  Initial Impression / Assessment and Plan / UC Course  I have reviewed the triage vital signs and the nursing notes.  Pertinent labs & imaging results that were available during my care of the patient were reviewed by me and considered in my medical decision making (see chart for details).    No evidence tongue swelling, difficulty swallowing, or respiratory distress.  Isolated angioedema of lower lip possibly drug reaction (?doxycycline or ?benzonatate).  Note that patient had also just finished a five day course of prednisone two days prior to onset of lip swelling.  No evidence of respiratory infection at this time. Recommend discontinuing doxycycline and benzonatate Annular rash above left buttock non-specific; will begin topical triamcinolone cream. Followup with PCP in three days.  Final Clinical Impressions(s) / UC Diagnoses   Final diagnoses:  Angioedema of lips, initial encounter  Rash and nonspecific skin eruption     Discharge Instructions     Discontinue doxycycline and Tessalon. May take plain Mucinex, with plenty of fluid, for cough.  If symptoms become significantly worse during the night or over the weekend, proceed to the local emergency room.     ED Prescriptions    Medication Sig Dispense Auth. Provider   predniSONE (DELTASONE) 10 MG tablet Take one tab PO once daily for 5 days 5 tablet Kandra Nicolas, MD   triamcinolone cream (KENALOG) 0.1 % Apply to rash twice daily 15 g Kandra Nicolas, MD        Kandra Nicolas, MD 02/25/20 [phone]

## 2020-02-23 NOTE — ED Triage Notes (Signed)
Patient here in w/c with wife accompanying; he is non verbal and she is giving history. Apparently he had televisit in 02/14/20 and diagnosed with possible RSV and placed on prednisone and doxycycline; had  covid test on 9/14 and it was negative; lives at home with wife and one other caretaker. Yesterday he playfully stuck tongue out and since then wife has noticed edema of lower lip which has not resolved today; did another televisit and was told to come for evaluation. Has had covid vaccinations.

## 2020-02-23 NOTE — Telephone Encounter (Signed)
Please call the patient's wife to make sure she is taking him to urgent care to be evaluated for his lip swelling. Thanks.

## 2020-02-23 NOTE — Assessment & Plan Note (Addendum)
Patient with lower lip swelling with potential throat swelling given description from wife.  He does not appear to be in any significant distress.  No indication of respiratory distress.  Discussed given his lower lip swelling and his potential throat swelling he needs to be evaluated in the emergency room to determine possible cause and further treatment and management.  She was hesitant to do this and I advised that if she was not going to take him to be evaluated in the emergency room they should take him to urgent care so somebody can physically see him in person.  She was given the address of [facility] [place] which appears to be the closest [facility] urgent care to her.  She noted she would take him there to be evaluated.

## 2020-02-23 NOTE — Progress Notes (Signed)
Virtual Visit via video Note  This visit type was conducted due to national recommendations for restrictions regarding the COVID-19 pandemic (e.g. social distancing).  This format is felt to be most appropriate for this patient at this time.  All issues noted in this document were discussed and addressed.  No physical exam was performed (except for noted visual exam findings with Video Visits).   I connected with Mitchell Abbott today at 12:20 PM EDT by a video enabled telemedicine application or telephone and verified that I am speaking with the correct person using two identifiers. Location patient: home Location provider: work Persons participating in the virtual visit: patient, provider, Jru Pense (wife)  I discussed the limitations, risks, security and privacy concerns of performing an evaluation and management service by telephone and the availability of in person appointments. I also discussed with the patient that there may be a patient responsible charge related to this service. The patient expressed understanding and agreed to proceed.  Reason for visit: same day visit  HPI: Lip swelling: Patient's wife notes that his bottom lip is been swollen for 2 days.  He seems to have had some difficulty swallowing and is drooling some so she wonders if he has throat swelling as well.  He is unsure if he has tongue swelling.  The patient at baseline has been sleeping a lot recently though she notes he is near his typical baseline.  The lip swelling symptoms have only been going on for 2 days.  She notes no shortness of breath or wheezing.  Notes she has not checked his blood pressure though does fluctuate and is typically low.  No complaints of abdominal pain.  He was recently diagnosed with RSV and has been treated with doxycycline, Tessalon, and prednisone.  They finished the prednisone.  He has been on the doxycycline and Tessalon since 16 September.   ROS: See pertinent positives and  negatives per HPI.  Past Medical History:  Diagnosis Date  . Actinic keratoses   . Adrenal hemorrhage ([place]) 2001   s/p fall from ladder--a f/u CT abd showed resolution of the hemorrhage- (duodenal polyp incidentally noted;  f/u EGD by Dr. Watt Climes 03/2000 showed small lipomatous mass in duodenum which was confirmed by biopsy.  . Alzheimer's dementia ([place])    with behav disturbance and apathy.  [facility] referral as of 07/[facility].  They added namenda 07/2014 due to pt's signif decline in the prior 6 mo.  . Arthralgia of knee, right 2009   exam suspicious for meniscal origin  . BPH (benign prostatic hypertrophy)    History of acute urinary retention (2006)  Hx of elevated PSA (7.26) + right sided prostate nodule on DRE by urologist.  PSA did not go down with tx with antibiotics--biopsy was done and showed NO MALIGNANCY but some chronic inflammation was present--turned out related to acute cystitis (enterococcus UTI).  PSA was 1.17 in 07/2011 at urologist's (Dr. Karsten Ro).  F/u at urol is now prn as of 08/2014.  Marland Kitchen BPPV (benign paroxysmal positional vertigo)   . DDD (degenerative disc disease), cervical    C5-6, C6-7 primarily  . Headache in back of head    Neurologist treated him with PT for this problem and it helped  . Hearing deficit    Bilateral; noise damage in [employer].  Has had hearing aids since about 2008.  Marland Kitchen Herpes zoster 07/25/2018   suspected--valtrex rx'd.  Marland Kitchen History of acute cystitis 08/2009   with acute prostatitis (enterococcus faecalis)  .  History of solitary pulmonary nodule    Initially noted on CT abd for his adrenal hemorrhage s/p fall from ladder 2001.  F/u CT chest 04/2000 showed that it had shrunk from 26mm to 37mm and had no malignant features.  . History of TIA (transient ischemic attack) @ 2008   02/2001 and 01/2006 carotid dopplers showed 50% or less stenosis.  . Left bundle branch hemiblock    mentioned in dx of old records 04/15/2009  . Migraine headache with aura  onset in teens   No migraines since about 2004  . Poor dentition    As of 07/2019 being referred to oral surgeon for removal of all implants and all remaining teeth  . RSV bronchitis 01/2020  . Skin cancer     Past Surgical History:  Procedure Laterality Date  . Carotid dopplers  05/2016   1-39% stenosis bilat ICA.  . cataract extrac    . dental     implants  . PROSTATE BIOPSY  04/07/2005   For elevated PSA + right sided prostate nodule on DRE by urologist:  benign biopsy.  . TONSILLECTOMY  preteen  . TRANSTHORACIC ECHOCARDIOGRAM  06/28/2016   EF 60-65%, mild dilatation of ascending aorta.    Family History  Problem Relation Age of Onset  . Sudden death Mother 84       FH info obtained from Dr. Ovid Curd records.  . Breast cancer Daughter   . Migraines Daughter     SOCIAL HX: Former smoker   Current Outpatient Medications:  .  aspirin 325 MG EC tablet, Take 1 tablet (325 mg total) by mouth daily. (Patient taking differently: Take 325 mg by mouth daily. Pt takes 1/2 tab of 325mg .), Disp: 90 tablet, Rfl: 3 .  benzonatate (TESSALON) 100 MG capsule, 1 cap po tid as needed for cough, Disp: 20 capsule, Rfl: 0 .  doxycycline (VIBRAMYCIN) 100 MG capsule, Take 1 capsule (100 mg total) by mouth 2 (two) times daily for 10 days., Disp: 20 capsule, Rfl: 0 .  fluconazole (DIFLUCAN) 150 MG tablet, 1 tab po qd x 14d, Disp: 14 tablet, Rfl: 0 .  fluticasone (CUTIVATE) 0.05 % cream, Apply topically 2 (two) times daily. Apply to upper back rash, Disp: 30 g, Rfl: 0 .  furosemide (LASIX) 20 MG tablet, TAKE 1 TABLET BY MOUTH EVERY DAY AS NEEDED FOR WORSENING OF EXTREMITY SWELLING, Disp: 90 tablet, Rfl: 1 .  mupirocin ointment (BACTROBAN) 2 %, , Disp: , Rfl:  .  predniSONE (DELTASONE) 20 MG tablet, 2 tabs po qd x 5d, Disp: 10 tablet, Rfl: 0 .  risperiDONE (RISPERDAL) 0.5 MG tablet, Take 0.125-0.25 mg by mouth See admin instructions. 1/4 tablet in am and 1/2 tablet pm, Disp: , Rfl:  .  rivastigmine  (EXELON) 13.3 MG/24HR, Place 13.3 mg onto the skin daily., Disp: , Rfl:  .  tamsulosin (FLOMAX) 0.4 MG CAPS capsule, Take 2 capsules (0.8 mg total) by mouth daily after supper. (Patient taking differently: Take 0.4 mg by mouth daily after supper. ), Disp: 180 capsule, Rfl: 3 .  terbinafine (LAMISIL) 1 % cream, , Disp: , Rfl:  .  Vitamins A & D (VITAMIN A & D) ointment, Apply 1 application topically 2 (two) times daily., Disp: , Rfl:   EXAM:  VITALS per patient if applicable:  GENERAL: Patient is drowsy appearing, intermittently opens his eyes and mouth and makes noise  HEENT: atraumatic, conjunttiva clear, no obvious abnormalities on inspection of external nose and ears, lower lip is  quite enlarged, upper lip appears normal, tongue appears normal  NECK: normal movements of the head and neck  LUNGS: on inspection no signs of respiratory distress, breathing rate appears normal, no obvious gross SOB, gasping or wheezing  CV: no obvious cyanosis   ASSESSMENT AND PLAN:  Discussed the following assessment and plan:  Lip swelling Patient with lower lip swelling with potential throat swelling given description from wife.  He does not appear to be in any significant distress.  No indication of respiratory distress.  Discussed given his lower lip swelling and his potential throat swelling he needs to be evaluated in the emergency room to determine possible cause and further treatment and management.  She was hesitant to do this and I advised that if she was not going to take him to be evaluated in the emergency room they should take him to urgent care so somebody can physically see him in person.  She was given the address of [facility] [place] which appears to be the closest [facility] urgent care to her.  She noted she would take him there to be evaluated.    No orders of the defined types were placed in this encounter.   No orders of the defined types were placed in this encounter.     I discussed the assessment and treatment plan with the patient. The patient was provided an opportunity to ask questions and all were answered. The patient agreed with the plan and demonstrated an understanding of the instructions.   The patient was advised to call back or seek an in-person evaluation if the symptoms worsen or if the condition fails to improve as anticipated.  Tommi Rumps, MD

## 2020-02-23 NOTE — Telephone Encounter (Signed)
I called the patient's wife to see if she took the patient to urgent care per provider and she stated she is waiting on a friend to pick her up to take her due to transportation issues, I informed her to call EMS if the friend does not show up and she understood.  Britini Garcilazo,cma

## 2020-02-23 NOTE — Discharge Instructions (Addendum)
Discontinue doxycycline and Tessalon. May take plain Mucinex, with plenty of fluid, for cough.  If symptoms become significantly worse during the night or over the weekend, proceed to the local emergency room.

## 2020-02-27 ENCOUNTER — Telehealth: Payer: Self-pay

## 2020-02-27 NOTE — Telephone Encounter (Signed)
Spoke with pt's wife, Mitchell Abbott and advised since no symptoms, ok to continue with scheduled appt for tomorrow.

## 2020-02-27 NOTE — Telephone Encounter (Signed)
Pt spouse returned call confirming appt tomorrow & confirming pt has no symptoms no fever no cough pt had NEG covid test 02/15/20.

## 2020-02-28 ENCOUNTER — Other Ambulatory Visit: Payer: Self-pay

## 2020-02-28 ENCOUNTER — Encounter: Payer: Self-pay | Admitting: Family Medicine

## 2020-02-28 ENCOUNTER — Ambulatory Visit (INDEPENDENT_AMBULATORY_CARE_PROVIDER_SITE_OTHER): Payer: Medicare PPO | Admitting: Family Medicine

## 2020-02-28 VITALS — BP 129/85 | HR 61 | Temp 97.8°F | Resp 16 | Ht 67.0 in | Wt 131.4 lb

## 2020-02-28 DIAGNOSIS — T783XXD Angioneurotic edema, subsequent encounter: Secondary | ICD-10-CM

## 2020-02-28 DIAGNOSIS — R21 Rash and other nonspecific skin eruption: Secondary | ICD-10-CM | POA: Diagnosis not present

## 2020-02-28 DIAGNOSIS — B974 Respiratory syncytial virus as the cause of diseases classified elsewhere: Secondary | ICD-10-CM | POA: Diagnosis not present

## 2020-02-28 DIAGNOSIS — B338 Other specified viral diseases: Secondary | ICD-10-CM

## 2020-02-28 NOTE — Progress Notes (Signed)
OFFICE VISIT  02/28/2020  CC:  Chief Complaint  Patient presents with  . Lip swelling    lower x2 days  . Rash    HPI:    Patient is a 86 y.o. Caucasian male with severe dementia who presents accompanied by his wife for f/u recent [place] [facility] visit 02/23/20 for angioedema of lips. I reviewed UC record in its entirety today. All hx given through pt's wife today b/c pt completely nonverbal. Lower lip swelling x 2d.  He had a resp illness recently (swab ended up showing RSV + but covid and flu neg) and had finished a course of doxy a few days prior and had also been taking tessalon.  He had finished a 5d course of prednisone a couple days prior to the lip swelling. No other sx's besides the lip swelling except wife noted L glut rash.  Exam revealed lower lip swelling and annular rash R buttock region described as nonspecific and rx'd topical steroid. Angioedema->question allergy to doxy or tessalon.  No labs or imaging were done. Prednisone 10mg  qd x 5d was rx'd by UC MD.  INTERIM HX: Today is the first day wife has noted his lip is no longer swollen at all. The rash on buttock area is resolving with triamcinolone cream rxd in ED.  His resp sx's I saw him for back on 9/16 have completely resolved for the last 3d.  Past Medical History:  Diagnosis Date  . Actinic keratoses   . Adrenal hemorrhage ([place]) 2001   s/p fall from ladder--a f/u CT abd showed resolution of the hemorrhage- (duodenal polyp incidentally noted;  f/u EGD by Dr. Watt Climes 03/2000 showed small lipomatous mass in duodenum which was confirmed by biopsy.  . Alzheimer's dementia ([place])    with behav disturbance and apathy.  [facility] referral as of 07/2013.  They added namenda 07/2014 due to pt's signif decline in the prior 6 mo.  . Arthralgia of knee, right 2009   exam suspicious for meniscal origin  . BPH (benign prostatic hypertrophy)    History of acute urinary retention (2006)  Hx of elevated PSA (7.26) + right  sided prostate nodule on DRE by urologist.  PSA did not go down with tx with antibiotics--biopsy was done and showed NO MALIGNANCY but some chronic inflammation was present--turned out related to acute cystitis (enterococcus UTI).  PSA was 1.17 in 07/2011 at urologist's (Dr. Karsten Ro).  F/u at urol is now prn as of 08/2014.  Marland Kitchen BPPV (benign paroxysmal positional vertigo)   . DDD (degenerative disc disease), cervical    C5-6, C6-7 primarily  . Headache in back of head    Neurologist treated him with PT for this problem and it helped  . Hearing deficit    Bilateral; noise damage in [employer].  Has had hearing aids since about 2008.  Marland Kitchen Herpes zoster 07/25/2018   suspected--valtrex rx'd.  Marland Kitchen History of acute cystitis 08/2009   with acute prostatitis (enterococcus faecalis)  . History of solitary pulmonary nodule    Initially noted on CT abd for his adrenal hemorrhage s/p fall from ladder 2001.  F/u CT chest 04/2000 showed that it had shrunk from 67mm to 97mm and had no malignant features.  . History of TIA (transient ischemic attack) @ 2008   02/2001 and 01/2006 carotid dopplers showed 50% or less stenosis.  . Left bundle branch hemiblock    mentioned in dx of old records 04/15/2009  . Migraine headache with aura onset in teens  No migraines since about 2004  . Poor dentition    As of 07/2019 being referred to oral surgeon for removal of all implants and all remaining teeth  . RSV bronchitis 01/2020  . Skin cancer     Past Surgical History:  Procedure Laterality Date  . Carotid dopplers  05/2016   1-39% stenosis bilat ICA.  . cataract extrac    . dental     implants  . PROSTATE BIOPSY  04/07/2005   For elevated PSA + right sided prostate nodule on DRE by urologist:  benign biopsy.  . TONSILLECTOMY  preteen  . TRANSTHORACIC ECHOCARDIOGRAM  06/28/2016   EF 60-65%, mild dilatation of ascending aorta.    Outpatient Medications Prior to Visit  Medication Sig Dispense Refill  . aspirin 325 MG  EC tablet Take 1 tablet (325 mg total) by mouth daily. (Patient taking differently: Take 325 mg by mouth daily. Pt takes 1/2 tab of 325mg .) 90 tablet 3  . fluticasone (CUTIVATE) 0.05 % cream Apply topically 2 (two) times daily. Apply to upper back rash 30 g 0  . mupirocin ointment (BACTROBAN) 2 %     . risperiDONE (RISPERDAL) 0.5 MG tablet Take 0.125-0.25 mg by mouth See admin instructions. 1/4 tablet in am and 1/2 tablet pm    . rivastigmine (EXELON) 13.3 MG/24HR Place 13.3 mg onto the skin daily.    . tamsulosin (FLOMAX) 0.4 MG CAPS capsule Take 2 capsules (0.8 mg total) by mouth daily after supper. (Patient taking differently: Take 0.4 mg by mouth daily after supper. ) 180 capsule 3  . terbinafine (LAMISIL) 1 % cream     . triamcinolone cream (KENALOG) 0.1 % Apply to rash twice daily 15 g 0  . Vitamins A & D (VITAMIN A & D) ointment Apply 1 application topically 2 (two) times daily.    . benzonatate (TESSALON) 100 MG capsule 1 cap po tid as needed for cough 20 capsule 0  . fluconazole (DIFLUCAN) 150 MG tablet 1 tab po qd x 14d 14 tablet 0  . furosemide (LASIX) 20 MG tablet TAKE 1 TABLET BY MOUTH EVERY DAY AS NEEDED FOR WORSENING OF EXTREMITY SWELLING 90 tablet 1  . predniSONE (DELTASONE) 10 MG tablet Take one tab PO once daily for 5 days (Patient not taking: Reported on 02/28/2020) 5 tablet 0   No facility-administered medications prior to visit.    No Known Allergies  ROS As per HPI  PE: Vitals with BMI 02/28/2020 02/23/2020 02/23/2020  Height 5\' 7"  5\' 7"  -  Weight 131 lbs 6 oz 133 lbs -  BMI 19.75 88.32 -  Systolic 549 - 826  Diastolic 85 - 80  Pulse 61 - -     Gen: Alert, well appearing.  Patient is smiling and makes good eye contact but cannot speak or follow commands. No swelling of face or lips or tongue or mouth or throat. CV: RRR, no m/r/g.   LUNGS: CTA bilat, nonlabored resps, good aeration in all lung fields. EXT: no clubbing or cyanosis.  no edema.  SKIN: no rash  except some spotty, mildly raised/palpable erythematous splotches on R upper glut region.  Marland Kitchen  LABS:    Chemistry      Component Value Date/Time   NA 138 08/24/2019 1800   K 5.2 (H) 08/24/2019 1800   CL 103 08/24/2019 1800   CO2 24 08/24/2019 1800   BUN 23 08/24/2019 1800   CREATININE 1.11 08/24/2019 1800   CREATININE 1.02 09/05/2015 1528  Component Value Date/Time   CALCIUM 9.2 08/24/2019 1800   ALKPHOS 64 06/28/2018 1151   AST 22 06/28/2018 1151   ALT 16 06/28/2018 1151   BILITOT 0.7 06/28/2018 1151       IMPRESSION AND PLAN:  1) Viral URI, RSV+ about 2 wks ago. This has completely resolved now. His lip swelling could have been a manifestation of this viral syndrome as well.  2) Lower lip angioedema: no other site of angioedema, no sign of cardiopulm rxn, no generalized rash or hives. Question allergy to recent course of doxycycline I rx'd him vs tessalon pearls he was taking for cough. Resolved completely now s/p 5d of low dose prednisone. Instructions: Buy generic over the counter allegra 180 mg tabs (small package) to have at home in case his lip swelling returns.  If lip swelling returns, give a dose of the allegra and call our office for appointment.  3) L buttock rash: nonspecific inflammation: improving with triamcinolone cream. Continue this.  An After Visit Summary was printed and given to the patient.  FOLLOW UP: Return if symptoms worsen or fail to improve.  Signed:  Crissie Sickles, MD           02/28/2020

## 2020-02-28 NOTE — Patient Instructions (Signed)
Buy generic over the counter allegra 180 mg tabs (small package) to have at home in case his lip swelling returns.  If lip swelling returns, give a dose of the allegra and call our office for appointment.

## 2020-03-24 ENCOUNTER — Encounter: Payer: Self-pay | Admitting: Family Medicine

## 2020-03-24 ENCOUNTER — Other Ambulatory Visit: Payer: Self-pay

## 2020-03-24 ENCOUNTER — Ambulatory Visit (INDEPENDENT_AMBULATORY_CARE_PROVIDER_SITE_OTHER): Payer: Medicare PPO | Admitting: Family Medicine

## 2020-03-24 VITALS — BP 106/70 | HR 99 | Temp 98.4°F | Resp 16 | Ht 67.0 in | Wt 137.2 lb

## 2020-03-24 DIAGNOSIS — R21 Rash and other nonspecific skin eruption: Secondary | ICD-10-CM | POA: Diagnosis not present

## 2020-03-24 NOTE — Patient Instructions (Signed)
Apply over the counter lamisil cream (terbinafine) twice per day to his rash on buttock. If this is a fungus causing his rash, it may take a month or so for this to go away with this medication.

## 2020-03-24 NOTE — Progress Notes (Signed)
OFFICE VISIT  03/24/2020  CC:  Chief Complaint  Patient presents with  . Rash    located on back,    HPI:    Patient is a 86 y.o. Caucasian male with severe dementia who presents for "rash won't go away". He is nonverbal.  All hx from his wife as per usual.  A/P as of last visit: "1) Viral URI, RSV+ about 2 wks ago. This has completely resolved now. His lip swelling could have been a manifestation of this viral syndrome as well.  2) Lower lip angioedema: no other site of angioedema, no sign of cardiopulm rxn, no generalized rash or hives. Question allergy to recent course of doxycycline I rx'd him vs tessalon pearls he was taking for cough. Resolved completely now s/p 5d of low dose prednisone. Instructions: Buy generic over the counter allegra 180 mg tabs (small package) to have at home in case his lip swelling returns.  If lip swelling returns, give a dose of the allegra and call our office for appointment.  3) L buttock rash: nonspecific inflammation: improving with triamcinolone cream. Continue this."  INTERIM HX: Still with some buttock rash. Question of some improvement with use of triamcinolone and/or cutivate creams. Improvement plateaued, though.  Doesn't act like it hurts or itches. No further lip swelling.  No tongue swelling or facial swelling.  No hives.   Past Medical History:  Diagnosis Date  . Actinic keratoses   . Adrenal hemorrhage ([place]) 2001   s/p fall from ladder--a f/u CT abd showed resolution of the hemorrhage- (duodenal polyp incidentally noted;  f/u EGD by Dr. Watt Climes 03/2000 showed small lipomatous mass in duodenum which was confirmed by biopsy.  . Alzheimer's dementia ([place])    with behav disturbance and apathy.  [facility] as [facility].  They added namenda 07/2014 due to pt's signif decline in the prior 6 mo.  . Arthralgia of knee, right 2009   exam suspicious for meniscal origin  . BPH (benign prostatic hypertrophy)    History of  acute urinary retention (2006)  Hx of elevated PSA (7.26) + right sided prostate nodule on DRE by urologist.  PSA did not go down with tx with antibiotics--biopsy was done and showed NO MALIGNANCY but some chronic inflammation was present--turned out related to acute cystitis (enterococcus UTI).  PSA was 1.17 in 07/2011 at urologist's (Dr. Karsten Ro).  F/u at urol is now prn as of 08/2014.  Marland Kitchen BPPV (benign paroxysmal positional vertigo)   . DDD (degenerative disc disease), cervical    C5-6, C6-7 primarily  . Headache in back of head    Neurologist treated him with PT for this problem and it helped  . Hearing deficit    Bilateral; noise damage in [employer]  Has had hearing aids since about 2008.  Marland Kitchen Herpes zoster 07/25/2018   suspected--valtrex rx'd.  Marland Kitchen History of acute cystitis 08/2009   with acute prostatitis (enterococcus faecalis)  . History of solitary pulmonary nodule    Initially noted on CT abd for his adrenal hemorrhage s/p fall from ladder 2001.  F/u CT chest 04/2000 showed that it had shrunk from 6mm to 39mm and had no malignant features.  . History of TIA (transient ischemic attack) @ 2008   02/2001 and 01/2006 carotid dopplers showed 50% or less stenosis.  . Left bundle branch hemiblock    mentioned in dx of old records 04/15/2009  . Migraine headache with aura onset in teens   No migraines since about  2004  . Poor dentition    As of 07/2019 being referred to oral surgeon for removal of all implants and all remaining teeth  . RSV bronchitis 01/2020  . Skin cancer     Past Surgical History:  Procedure Laterality Date  . Carotid dopplers  05/2016   1-39% stenosis bilat ICA.  . cataract extrac    . dental     implants  . PROSTATE BIOPSY  04/07/2005   For elevated PSA + right sided prostate nodule on DRE by urologist:  benign biopsy.  . TONSILLECTOMY  preteen  . TRANSTHORACIC ECHOCARDIOGRAM  06/28/2016   EF 60-65%, mild dilatation of ascending aorta.    Outpatient Medications  Prior to Visit  Medication Sig Dispense Refill  . aspirin 325 MG EC tablet Take 1 tablet (325 mg total) by mouth daily. (Patient taking differently: Take 325 mg by mouth daily. Pt takes 1/2 tab of 325mg .) 90 tablet 3  . fluticasone (CUTIVATE) 0.05 % cream Apply topically 2 (two) times daily. Apply to upper back rash 30 g 0  . mupirocin ointment (BACTROBAN) 2 %     . risperiDONE (RISPERDAL) 0.5 MG tablet Take 0.125-0.25 mg by mouth See admin instructions. 1/4 tablet in am and 1/2 tablet pm    . rivastigmine (EXELON) 13.3 MG/24HR Place 13.3 mg onto the skin daily.    . tamsulosin (FLOMAX) 0.4 MG CAPS capsule Take 2 capsules (0.8 mg total) by mouth daily after supper. (Patient taking differently: Take 0.4 mg by mouth daily after supper. ) 180 capsule 3  . terbinafine (LAMISIL) 1 % cream     . triamcinolone cream (KENALOG) 0.1 % Apply to rash twice daily 15 g 0  . Vitamins A & D (VITAMIN A & D) ointment Apply 1 application topically 2 (two) times daily.     No facility-administered medications prior to visit.    Allergies  Allergen Reactions  . Doxycycline Swelling    Lower lip swelling  . Tessalon [Benzonatate] Swelling    Lower lip swelling    ROS As per HPI  PE: Vitals with BMI 03/24/2020 02/28/2020 02/23/2020  Height 5\' 7"  5\' 7"  5\' 7"   Weight 137 lbs 3 oz 131 lbs 6 oz 133 lbs  BMI 21.48 37.62 83.15  Systolic 176 160 -  Diastolic 70 85 -  Pulse 99 61 -    Gen: Alert, disoriented as per his usual, moaning some. Otherwise not ill- appearing.  Patient is oriented to person, place, time, and situation. Lips pink, no swelling.   Extremities pink--NO cyanosis. L buttock region (upper aspect) with pinkish, splotchy rash, nonblanching.  A few areas of it have very subtle dryness/superficial desquamation.  Question of satellite lesions.  No skin breakdown, no hives, no ecchymoses. Interglut region with mild diaper dermatitis.  LABS:  Lab Results  Component Value Date   TSH 1.41  10/13/2011   Lab Results  Component Value Date   WBC 10.2 08/24/2019   HGB 14.8 08/24/2019   HCT 46.5 08/24/2019   MCV 105.0 (H) 08/24/2019   PLT 315 08/24/2019   Lab Results  Component Value Date   CREATININE 1.11 08/24/2019   BUN 23 08/24/2019   NA 138 08/24/2019   K 5.2 (H) 08/24/2019   CL 103 08/24/2019   CO2 24 08/24/2019   Lab Results  Component Value Date   ALT 16 06/28/2018   AST 22 06/28/2018   ALKPHOS 64 06/28/2018   BILITOT 0.7 06/28/2018   Lab Results  Component Value Date   CHOL 168 10/22/2014   Lab Results  Component Value Date   HDL 53.10 10/22/2014   Lab Results  Component Value Date   LDLCALC 97 10/22/2014   Lab Results  Component Value Date   TRIG 91.0 10/22/2014   Lab Results  Component Value Date   CHOLHDL 3 10/22/2014   Lab Results  Component Value Date   PSA 1.73 08/24/2013   PSA 1.30 01/05/2012    IMPRESSION AND PLAN:  Subacute/chronic nonspecific L buttock rash: dermatitis vs tinea corporis. Questionable improvement with use of triamcinolone and cutivate creams.   Will stop these and try otc lamisil bid. Reassurance given. Signs/symptoms to call or return for were reviewed and pt expressed understanding.  We weren't able to get pulse ox today, but the reading of 80 entered in VS section is highly suspected to be NOTE accurate.  No SOB or cyanosis on exam.  An After Visit Summary was printed and given to the patient.  FOLLOW UP: Return in about 4 weeks (around 04/21/2020) for f/u buttock rash.  Signed:  Crissie Sickles, MD           03/24/2020

## 2020-06-09 ENCOUNTER — Other Ambulatory Visit: Payer: Medicare PPO

## 2020-06-09 DIAGNOSIS — Z20822 Contact with and (suspected) exposure to covid-19: Secondary | ICD-10-CM | POA: Diagnosis not present

## 2020-06-09 DIAGNOSIS — U071 COVID-19: Secondary | ICD-10-CM

## 2020-06-09 HISTORY — DX: COVID-19: U07.1

## 2020-06-11 ENCOUNTER — Telehealth: Payer: Self-pay | Admitting: Family Medicine

## 2020-06-11 ENCOUNTER — Encounter: Payer: Self-pay | Admitting: Family Medicine

## 2020-06-11 LAB — SARS-COV-2, NAA 2 DAY TAT

## 2020-06-11 LAB — NOVEL CORONAVIRUS, NAA: SARS-CoV-2, NAA: DETECTED — AB

## 2020-06-11 NOTE — Telephone Encounter (Signed)
Noted  

## 2020-06-11 NOTE — Telephone Encounter (Signed)
Patient's wife states he was exposed to Covid at daycare and has now tested positive. She said she does not have any symptoms and she is giving him fluids and rest. She would like to to know what else to do for him. Please call her to advise.

## 2020-06-11 NOTE — Telephone Encounter (Signed)
Spoke with Mitchell Abbott who states is doing fine besides extreme fatigue; Pt tested pos 1/10; Pt denies fever, cough, or SOB. Pt wife was advise to continue to monitor sx and hydrate; She will CB after 5 days to f/u on sx; If sx worsen or develop she will call to sched a VV.    FYI; no CB needed at this time

## 2020-06-12 ENCOUNTER — Other Ambulatory Visit: Payer: Self-pay | Admitting: Oncology

## 2020-06-12 ENCOUNTER — Encounter: Payer: Self-pay | Admitting: Oncology

## 2020-06-12 ENCOUNTER — Telehealth: Payer: Self-pay | Admitting: Oncology

## 2020-06-12 DIAGNOSIS — U071 COVID-19: Secondary | ICD-10-CM

## 2020-06-12 NOTE — Progress Notes (Signed)
I connected by phone with Mitchell Abbott on 06/12/2020 at 4:16 PM to discuss the potential use of a new treatment for mild to moderate COVID-19 viral infection in non-hospitalized patients.  This patient is a 87 y.o. male that meets the FDA criteria for Emergency Use Authorization of COVID monoclonal antibody sotrovimab.  Has a (+) direct SARS-CoV-2 viral test result  Has mild or moderate COVID-19   Is NOT hospitalized due to COVID-19  Is within 10 days of symptom onset  Has at least one of the high risk factor(s) for progression to severe COVID-19 and/or hospitalization as defined in EUA.  Specific high risk criteria : Chronic Kidney Disease (CKD), Immunosuppressive Disease or Treatment and Cardiovascular disease or hypertension   I have spoken and communicated the following to the patient or parent/caregiver regarding COVID monoclonal antibody treatment:  1. FDA has authorized the emergency use for the treatment of mild to moderate COVID-19 in adults and pediatric patients with positive results of direct SARS-CoV-2 viral testing who are 81 years of age and older weighing at least 40 kg, and who are at high risk for progressing to severe COVID-19 and/or hospitalization.  2. The significant known and potential risks and benefits of COVID monoclonal antibody, and the extent to which such potential risks and benefits are unknown.  3. Information on available alternative treatments and the risks and benefits of those alternatives, including clinical trials.  4. Patients treated with COVID monoclonal antibody should continue to self-isolate and use infection control measures (e.g., wear mask, isolate, social distance, avoid sharing personal items, clean and disinfect "high touch" surfaces, and frequent handwashing) according to CDC guidelines.   5. The patient or parent/caregiver has the option to accept or refuse COVID monoclonal antibody treatment.  After reviewing this information with  the patient, the patient has agreed to receive one of the available covid 19 monoclonal antibodies and will be provided an appropriate fact sheet prior to infusion. Jacquelin Hawking, NP 06/12/2020 4:16 PM

## 2020-06-12 NOTE — Telephone Encounter (Signed)
Called to discuss with patient about COVID-19 symptoms and the use of one of the available treatments for those with mild to moderate Covid symptoms and at a high risk of hospitalization.  Pt appears to qualify for outpatient treatment due to co-morbid conditions and/or a member of an at-risk group in accordance with the FDA Emergency Use Authorization.    Symptom onset: 06/09/20 Vaccinated: yes Booster? No  Immunocompromised? No  Qualifiers:  Past Medical History:  Diagnosis Date  . Actinic keratoses   . Adrenal hemorrhage ([place]) 2001   s/p fall from ladder--a f/u CT abd showed resolution of the hemorrhage- (duodenal polyp incidentally noted;  f/u EGD by Dr. Watt Climes 03/2000 showed small lipomatous mass in duodenum which was confirmed by biopsy.  . Alzheimer's dementia ([place])    with behav disturbance and apathy.  [facility] referral as of [facility].  They added namenda 07/2014 due to pt's signif decline in the prior 6 mo.  . Arthralgia of knee, right 2009   exam suspicious for meniscal origin  . BPH (benign prostatic hypertrophy)    History of acute urinary retention (2006)  Hx of elevated PSA (7.26) + right sided prostate nodule on DRE by urologist.  PSA did not go down with tx with antibiotics--biopsy was done and showed NO MALIGNANCY but some chronic inflammation was present--turned out related to acute cystitis (enterococcus UTI).  PSA was 1.17 in 07/2011 at urologist's (Dr. Karsten Ro).  F/u at urol is now prn as of 08/2014.  Marland Kitchen BPPV (benign paroxysmal positional vertigo)   . COVID-19 virus infection 06/09/2020  . DDD (degenerative disc disease), cervical    C5-6, C6-7 primarily  . Headache in back of head    Neurologist treated him with PT for this problem and it helped  . Hearing deficit    Bilateral; noise damage in [employer]  Has had hearing aids since about 2008.  Marland Kitchen Herpes zoster 07/25/2018   suspected--valtrex rx'd.  Marland Kitchen History of acute cystitis 08/2009   with acute prostatitis  (enterococcus faecalis)  . History of solitary pulmonary nodule    Initially noted on CT abd for his adrenal hemorrhage s/p fall from ladder 2001.  F/u CT chest 04/2000 showed that it had shrunk from 95mm to 51mm and had no malignant features.  . History of TIA (transient ischemic attack) @ 2008   02/2001 and 01/2006 carotid dopplers showed 50% or less stenosis.  . Left bundle branch hemiblock    mentioned in dx of old records 04/15/2009  . Migraine headache with aura onset in teens   No migraines since about 2004  . Poor dentition    As of 07/2019 being referred to oral surgeon for removal of all implants and all remaining teeth  . RSV bronchitis 01/2020  . Skin cancer    Unable to reach pt - Left VM and MCM  Jacquelin Hawking

## 2020-06-13 ENCOUNTER — Ambulatory Visit (HOSPITAL_COMMUNITY)
Admission: RE | Admit: 2020-06-13 | Discharge: 2020-06-13 | Disposition: A | Discharge status: Home / Self Care | Payer: Medicare PPO | Source: Ambulatory Visit | Attending: Pulmonary Disease | Admitting: Pulmonary Disease

## 2020-06-13 DIAGNOSIS — U071 COVID-19: Secondary | ICD-10-CM

## 2020-06-13 MED ORDER — ALBUTEROL SULFATE HFA 108 (90 BASE) MCG/ACT IN AERS: 2.0000 | INHALATION_SPRAY | Freq: Once | RESPIRATORY_TRACT | Status: DC | PRN

## 2020-06-13 MED ORDER — FAMOTIDINE IN NACL 20-0.9 MG/50ML-% IV SOLN: 20.0000 mg | Freq: Once | INTRAVENOUS | Status: DC | PRN

## 2020-06-13 MED ORDER — EPINEPHRINE 0.3 MG/0.3ML IJ SOAJ: 0.3000 mg | Freq: Once | INTRAMUSCULAR | Status: DC | PRN

## 2020-06-13 MED ORDER — SODIUM CHLORIDE 0.9 % IV SOLN: INTRAVENOUS | Status: DC | PRN

## 2020-06-13 MED ORDER — METHYLPREDNISOLONE SODIUM SUCC 125 MG IJ SOLR: 125.0000 mg | Freq: Once | INTRAMUSCULAR | Status: DC | PRN

## 2020-06-13 MED ORDER — DIPHENHYDRAMINE HCL 50 MG/ML IJ SOLN: 50.0000 mg | Freq: Once | INTRAMUSCULAR | Status: DC | PRN

## 2020-06-13 MED ORDER — SOTROVIMAB 500 MG/8ML IV SOLN
500.0000 mg | Freq: Once | INTRAVENOUS | Status: AC
  Administered 2020-06-13: 500 mg via INTRAVENOUS

## 2020-06-13 NOTE — Progress Notes (Signed)
Patient reviewed Fact Sheet for Patients, Parents, and Caregivers for Emergency Use Authorization (EUA) of sotrovimab for the Treatment of Coronavirus. Patient also reviewed and is agreeable to the estimated cost of treatment. Patient is agreeable to proceed.   

## 2020-06-13 NOTE — Progress Notes (Signed)
Diagnosis: COVID-19  Physician: Dr. Patrick Wright  Procedure: Covid Infusion Clinic Med: Sotrovimab infusion - Provided patient with sotrovimab fact sheet for patients, parents, and caregivers prior to infusion.   Complications: No immediate complications noted  Discharge: Discharged home    

## 2020-06-13 NOTE — Discharge Instructions (Signed)

## 2020-06-19 ENCOUNTER — Other Ambulatory Visit: Payer: Self-pay

## 2020-06-20 ENCOUNTER — Encounter: Payer: Self-pay | Admitting: Family Medicine

## 2020-06-20 ENCOUNTER — Telehealth (INDEPENDENT_AMBULATORY_CARE_PROVIDER_SITE_OTHER): Payer: Medicare PPO | Admitting: Family Medicine

## 2020-06-20 VITALS — BP 136/74 | HR 61 | Temp 97.9°F | Wt 135.0 lb

## 2020-06-20 DIAGNOSIS — Z8616 Personal history of COVID-19: Secondary | ICD-10-CM | POA: Diagnosis not present

## 2020-06-20 DIAGNOSIS — F0391 Unspecified dementia with behavioral disturbance: Secondary | ICD-10-CM | POA: Diagnosis not present

## 2020-06-20 NOTE — Progress Notes (Signed)
Virtual Visit via Video Note  I connected with pt on 06/20/20 at  3:30 PM EST by a video enabled telemedicine application and verified that I am speaking with the correct person using two identifiers.  Location patient: home, [place] Location provider:work or home office Persons participating in the virtual visit: patient, provider  I discussed the limitations of evaluation and management by telemedicine and the availability of in person appointments. The patient expressed understanding and agreed to proceed.  Telemedicine visit is a necessity given the COVID-19 restrictions in place at the current time.  HPI: 87 y/o WM being seen accompanied by his wife for concern of recent decline in overall function.  Pt exposed to covid at his adult daycare 06/03/19, he tested pos (PCR) 06/09/20 but Mitchell Abbott had no symptoms.  He got monoclonal ab infusion 06/13/20.  Sounds like grad dec strength over the last month (onset prior to covid inf).   Needs to lean on her more when walking, for example.  Mental alertness a little less.  Used to be able to play solitaire but less able to do this now.  Sleeping more.  No recent fall. Eating and drinking ok but ? Urine a little darker than usual?  No odor issue. No diarrhea (has one formed bm qod). Mitchell Abbott notes these things but has not been alarmed, just wanted to talk things over today to see if anything diff to do or is this just general declined c/w age + severe dementia?  ROS: See pertinent positives and negatives per HPI.  Past Medical History:  Diagnosis Date  . Actinic keratoses   . Adrenal hemorrhage ([place]) 2001   s/p fall from ladder--a f/u CT abd showed resolution of the hemorrhage- (duodenal polyp incidentally noted;  f/u EGD by Dr. Watt Climes 03/2000 showed small lipomatous mass in duodenum which was confirmed by biopsy.  . Alzheimer's dementia ([place])    with behav disturbance and apathy.  [facility] referral as of [facility].  They added namenda 07/2014 due to  pt's signif decline in the prior 6 mo.  . Arthralgia of knee, right 2009   exam suspicious for meniscal origin  . BPH (benign prostatic hypertrophy)    History of acute urinary retention (2006)  Hx of elevated PSA (7.26) + right sided prostate nodule on DRE by urologist.  PSA did not go down with tx with antibiotics--biopsy was done and showed NO MALIGNANCY but some chronic inflammation was present--turned out related to acute cystitis (enterococcus UTI).  PSA was 1.17 in 07/2011 at urologist's (Dr. Karsten Ro).  F/u at urol is now prn as of 08/2014.  Marland Kitchen BPPV (benign paroxysmal positional vertigo)   . COVID-19 virus infection 06/09/2020  . DDD (degenerative disc disease), cervical    C5-6, C6-7 primarily  . Headache in back of head    Neurologist treated him with PT for this problem and it helped  . Hearing deficit    Bilateral; noise damage in [employer]  Has had hearing aids since about 2008.  Marland Kitchen Herpes zoster 07/25/2018   suspected--valtrex rx'd.  Marland Kitchen History of acute cystitis 08/2009   with acute prostatitis (enterococcus faecalis)  . History of solitary pulmonary nodule    Initially noted on CT abd for his adrenal hemorrhage s/p fall from ladder 2001.  F/u CT chest 04/2000 showed that it had shrunk from 61mm to 81mm and had no malignant features.  . History of TIA (transient ischemic attack) @ 2008   02/2001 and 01/2006 carotid dopplers showed 50%  or less stenosis.  . Left bundle branch hemiblock    mentioned in dx of old records 04/15/2009  . Migraine headache with aura onset in teens   No migraines since about 2004  . Poor dentition    As of 07/2019 being referred to oral surgeon for removal of all implants and all remaining teeth  . RSV bronchitis 01/2020  . Skin cancer     Past Surgical History:  Procedure Laterality Date  . Carotid dopplers  05/2016   1-39% stenosis bilat ICA.  . cataract extrac    . dental     implants  . PROSTATE BIOPSY  04/07/2005   For elevated PSA + right  sided prostate nodule on DRE by urologist:  benign biopsy.  . TONSILLECTOMY  preteen  . TRANSTHORACIC ECHOCARDIOGRAM  06/28/2016   EF 60-65%, mild dilatation of ascending aorta.     Current Outpatient Medications:  .  aspirin 325 MG EC tablet, Take 1 tablet (325 mg total) by mouth daily. (Patient taking differently: Take 325 mg by mouth daily. Pt takes 1/2 tab of 325mg .), Disp: 90 tablet, Rfl: 3 .  tamsulosin (FLOMAX) 0.4 MG CAPS capsule, Take 2 capsules (0.8 mg total) by mouth daily after supper. (Patient taking differently: Take 0.4 mg by mouth daily after supper.), Disp: 180 capsule, Rfl: 3 .  terbinafine (LAMISIL) 1 % cream, , Disp: , Rfl:  .  Vitamins A & D (VITAMIN A & D) ointment, Apply 1 application topically 2 (two) times daily., Disp: , Rfl:  .  White Petrolatum (WHITE PETROLEUM JELLY) GEL, Apply topically., Disp: , Rfl:  .  fluticasone (CUTIVATE) 0.05 % cream, Apply topically 2 (two) times daily. Apply to upper back rash (Patient not taking: Reported on 06/20/2020), Disp: 30 g, Rfl: 0 .  mupirocin ointment (BACTROBAN) 2 %, , Disp: , Rfl:  .  risperiDONE (RISPERDAL) 0.5 MG tablet, Take 0.125-0.25 mg by mouth See admin instructions. 1/4 tablet in am and 1/2 tablet pm (Patient not taking: Reported on 06/20/2020), Disp: , Rfl:  .  rivastigmine (EXELON) 13.3 MG/24HR, Place 13.3 mg onto the skin daily. (Patient not taking: Reported on 06/20/2020), Disp: , Rfl:  .  triamcinolone cream (KENALOG) 0.1 %, Apply to rash twice daily (Patient not taking: Reported on 06/20/2020), Disp: 15 g, Rfl: 0  EXAM:  VITALS per patient if applicable:  Vitals with BMI 06/20/2020 06/13/2020 06/13/2020  Height - - -  Weight 135 lbs - -  BMI - - -  Systolic 973 532 992  Diastolic 74 81 74  Pulse 61 64 71     GENERAL: alert, oriented, appears well and in no acute distress  HEENT: atraumatic, conjunttiva clear, no obvious abnormalities on inspection of external nose and ears  NECK: normal movements of the  head and neck  LUNGS: on inspection no signs of respiratory distress, breathing rate appears normal, no obvious gross SOB, gasping or wheezing  CV: no obvious cyanosis  MS: moves all visible extremities without noticeable abnormality  PSYCH/NEURO: pleasant and cooperative, no obvious depression or anxiety, speech and thought processing grossly intact  LABS: none today    Chemistry      Component Value Date/Time   NA 138 08/24/2019 1800   K 5.2 (H) 08/24/2019 1800   CL 103 08/24/2019 1800   CO2 24 08/24/2019 1800   BUN 23 08/24/2019 1800   CREATININE 1.11 08/24/2019 1800   CREATININE 1.02 09/05/2015 1528      Component Value Date/Time  CALCIUM 9.2 08/24/2019 1800   ALKPHOS 64 06/28/2018 1151   AST 22 06/28/2018 1151   ALT 16 06/28/2018 1151   BILITOT 0.7 06/28/2018 1151     Lab Results  Component Value Date   WBC 10.2 08/24/2019   HGB 14.8 08/24/2019   HCT 46.5 08/24/2019   MCV 105.0 (H) 08/24/2019   PLT 315 08/24/2019   Lab Results  Component Value Date   TSH 1.41 10/13/2011    ASSESSMENT AND PLAN:  Discussed the following assessment and plan:  Dementia with behavioral disturbance: gradual decline, not overly concerning for new acute process. Recent covid + but pt did not display any sx's. I reassured Mitchell Abbott and Mitchell Abbott and I don't think any testing is indicated. OK to return to adult daycare. Signs/symptoms to call or return for were reviewed and pt expressed understanding.  I discussed the assessment and treatment plan with the patient. The patient was provided an opportunity to ask questions and all were answered. The patient agreed with the plan and demonstrated an understanding of the instructions.   Spent 20 min with pt today reviewing HPI, reviewing relevant past history, doing exam, reviewing and discussing lab and imaging data, and formulating plans.  F/u: prn  Signed:  Crissie Sickles, MD           06/20/2020

## 2020-08-21 ENCOUNTER — Telehealth: Payer: Self-pay | Admitting: Family Medicine

## 2020-08-21 NOTE — Telephone Encounter (Signed)
Patient's wife dropped off Adult Daycare medical examination report. Placed in Dr. Idelle Leech inbox to be filled out.

## 2020-08-22 NOTE — Telephone Encounter (Signed)
Placed on PCP desk to review and sign, if appropriate.  

## 2020-08-22 NOTE — Telephone Encounter (Signed)
Signed and put in box to go up front. Signed:  Crissie Sickles, MD           08/22/2020

## 2020-08-25 NOTE — Telephone Encounter (Signed)
Faxed signed forms to number requested by patient, fax confirmation received. Called patient to pick up forms. Sent copies to scan.

## 2020-09-01 ENCOUNTER — Telehealth: Payer: Self-pay | Admitting: Family Medicine

## 2020-09-01 NOTE — Telephone Encounter (Signed)
Placed on PCP desk to review and sign, if appropriate.  

## 2020-09-01 NOTE — Telephone Encounter (Signed)
Received faxed form from [employer]. Placed in Dr. Idelle Leech front office inbox to be filled out.

## 2020-09-02 NOTE — Telephone Encounter (Signed)
Faxed completed and signed form to [employer]. Fax confirmation received. Sent form to scanning.

## 2020-09-10 ENCOUNTER — Telehealth: Payer: Self-pay | Admitting: Family Medicine

## 2020-09-10 NOTE — Telephone Encounter (Signed)
Attempted to schedule AWV. Unable to LVM.  Will try at later time.  

## 2020-11-27 ENCOUNTER — Other Ambulatory Visit: Payer: Self-pay

## 2020-11-27 ENCOUNTER — Emergency Department (HOSPITAL_BASED_OUTPATIENT_CLINIC_OR_DEPARTMENT_OTHER)
Admission: EM | Admit: 2020-11-27 | Discharge: 2020-11-27 | Disposition: A | Discharge status: Home / Self Care | Payer: Medicare PPO | Attending: Emergency Medicine | Admitting: Emergency Medicine

## 2020-11-27 ENCOUNTER — Emergency Department (HOSPITAL_BASED_OUTPATIENT_CLINIC_OR_DEPARTMENT_OTHER): Payer: Medicare PPO

## 2020-11-27 ENCOUNTER — Encounter (HOSPITAL_BASED_OUTPATIENT_CLINIC_OR_DEPARTMENT_OTHER): Payer: Self-pay | Admitting: *Deleted

## 2020-11-27 ENCOUNTER — Telehealth: Payer: Self-pay | Admitting: Family Medicine

## 2020-11-27 DIAGNOSIS — Z87891 Personal history of nicotine dependence: Secondary | ICD-10-CM | POA: Insufficient documentation

## 2020-11-27 DIAGNOSIS — N182 Chronic kidney disease, stage 2 (mild): Secondary | ICD-10-CM | POA: Diagnosis not present

## 2020-11-27 DIAGNOSIS — Z8616 Personal history of COVID-19: Secondary | ICD-10-CM | POA: Insufficient documentation

## 2020-11-27 DIAGNOSIS — J1089 Influenza due to other identified influenza virus with other manifestations: Secondary | ICD-10-CM | POA: Insufficient documentation

## 2020-11-27 DIAGNOSIS — N3000 Acute cystitis without hematuria: Secondary | ICD-10-CM

## 2020-11-27 DIAGNOSIS — F0390 Unspecified dementia without behavioral disturbance: Secondary | ICD-10-CM | POA: Diagnosis not present

## 2020-11-27 DIAGNOSIS — Z20822 Contact with and (suspected) exposure to covid-19: Secondary | ICD-10-CM | POA: Insufficient documentation

## 2020-11-27 DIAGNOSIS — Z85828 Personal history of other malignant neoplasm of skin: Secondary | ICD-10-CM | POA: Diagnosis not present

## 2020-11-27 DIAGNOSIS — Z7982 Long term (current) use of aspirin: Secondary | ICD-10-CM | POA: Diagnosis not present

## 2020-11-27 DIAGNOSIS — R509 Fever, unspecified: Secondary | ICD-10-CM | POA: Diagnosis not present

## 2020-11-27 DIAGNOSIS — R531 Weakness: Secondary | ICD-10-CM | POA: Diagnosis not present

## 2020-11-27 DIAGNOSIS — R9431 Abnormal electrocardiogram [ECG] [EKG]: Secondary | ICD-10-CM | POA: Diagnosis not present

## 2020-11-27 LAB — CBC WITH DIFFERENTIAL/PLATELET
Abs Immature Granulocytes: 0.08 10*3/uL — ABNORMAL HIGH (ref 0.00–0.07)
Basophils Absolute: 0 10*3/uL (ref 0.0–0.1)
Basophils Relative: 0 %
Eosinophils Absolute: 0 10*3/uL (ref 0.0–0.5)
Eosinophils Relative: 0 %
HCT: 40.8 % (ref 39.0–52.0)
Hemoglobin: 13.6 g/dL (ref 13.0–17.0)
Immature Granulocytes: 1 %
Lymphocytes Relative: 7 %
Lymphs Abs: 1 10*3/uL (ref 0.7–4.0)
MCH: 33.5 pg (ref 26.0–34.0)
MCHC: 33.3 g/dL (ref 30.0–36.0)
MCV: 100.5 fL — ABNORMAL HIGH (ref 80.0–100.0)
Monocytes Absolute: 0.9 10*3/uL (ref 0.1–1.0)
Monocytes Relative: 6 %
Neutro Abs: 12.7 10*3/uL — ABNORMAL HIGH (ref 1.7–7.7)
Neutrophils Relative %: 86 %
Platelets: 239 10*3/uL (ref 150–400)
RBC: 4.06 MIL/uL — ABNORMAL LOW (ref 4.22–5.81)
RDW: 13.3 % (ref 11.5–15.5)
WBC: 14.7 10*3/uL — ABNORMAL HIGH (ref 4.0–10.5)
nRBC: 0 % (ref 0.0–0.2)

## 2020-11-27 LAB — URINALYSIS, ROUTINE W REFLEX MICROSCOPIC
Bilirubin Urine: NEGATIVE
Glucose, UA: NEGATIVE mg/dL
Ketones, ur: NEGATIVE mg/dL
Nitrite: POSITIVE — AB
Protein, ur: NEGATIVE mg/dL
Specific Gravity, Urine: 1.025 (ref 1.005–1.030)
pH: 6 (ref 5.0–8.0)

## 2020-11-27 LAB — RESP PANEL BY RT-PCR (FLU A&B, COVID) ARPGX2
Influenza A by PCR: NEGATIVE
Influenza B by PCR: NEGATIVE
SARS Coronavirus 2 by RT PCR: NEGATIVE

## 2020-11-27 LAB — LACTIC ACID, PLASMA
Lactic Acid, Venous: 3.1 mmol/L (ref 0.5–1.9)
Lactic Acid, Venous: 2.1 mmol/L (ref 0.5–1.9)

## 2020-11-27 LAB — COMPREHENSIVE METABOLIC PANEL
ALT: 14 U/L (ref 0–44)
AST: 29 U/L (ref 15–41)
Albumin: 3.5 g/dL (ref 3.5–5.0)
Alkaline Phosphatase: 69 U/L (ref 38–126)
Anion gap: 8 (ref 5–15)
BUN: 24 mg/dL — ABNORMAL HIGH (ref 8–23)
CO2: 24 mmol/L (ref 22–32)
Calcium: 8.9 mg/dL (ref 8.9–10.3)
Chloride: 104 mmol/L (ref 98–111)
Creatinine, Ser: 1.07 mg/dL (ref 0.61–1.24)
GFR, Estimated: >60 mL/min (ref >60)
Glucose, Bld: 139 mg/dL — ABNORMAL HIGH (ref 70–99)
Potassium: 4.7 mmol/L (ref 3.5–5.1)
Sodium: 136 mmol/L (ref 135–145)
Total Bilirubin: 0.9 mg/dL (ref 0.3–1.2)
Total Protein: 6.1 g/dL — ABNORMAL LOW (ref 6.5–8.1)

## 2020-11-27 LAB — URINALYSIS, MICROSCOPIC (REFLEX): WBC, UA: >50 WBC/hpf (ref 0–5)

## 2020-11-27 MED ORDER — CEPHALEXIN 500 MG PO CAPS: 500.0000 mg | ORAL_CAPSULE | Freq: Two times a day (BID) | ORAL | 0 refills | Status: DC

## 2020-11-27 MED ORDER — CEPHALEXIN 500 MG PO CAPS: 500.0000 mg | ORAL_CAPSULE | Freq: Two times a day (BID) | ORAL | 0 refills | Status: AC

## 2020-11-27 MED ORDER — SODIUM CHLORIDE 0.9 % IV SOLN
2.0000 g | Freq: Once | INTRAVENOUS | Status: AC
  Administered 2020-11-27: 2 g via INTRAVENOUS
  Filled 2020-11-27: qty 20

## 2020-11-27 MED ORDER — HALOPERIDOL LACTATE 5 MG/ML IJ SOLN
2.0000 mg | Freq: Once | INTRAMUSCULAR | Status: AC
  Administered 2020-11-27: 2 mg via INTRAVENOUS
  Filled 2020-11-27: qty 1

## 2020-11-27 MED ORDER — SODIUM CHLORIDE 0.9 % IV BOLUS
500.0000 mL | Freq: Once | INTRAVENOUS | Status: AC
  Administered 2020-11-27: 500 mL via INTRAVENOUS

## 2020-11-27 MED ORDER — SODIUM CHLORIDE 0.9 % IV BOLUS
1000.0000 mL | Freq: Once | INTRAVENOUS | Status: AC
  Administered 2020-11-27: 1000 mL via INTRAVENOUS

## 2020-11-27 NOTE — Telephone Encounter (Signed)
Pt's wife called in for her husband. He was at "daycare" and she got a call to come and pick him up. He has a temp of 103. I transferred her to Nurse Triage.

## 2020-11-27 NOTE — ED Triage Notes (Addendum)
Wife reports fever and weakness x 1 day , fever today at daycare 100.3

## 2020-11-27 NOTE — Telephone Encounter (Signed)
[facility] - Client TELEPHONE ADVICE RECORD [facility] Patient Name: Mitchell Abbott [facility] Gender: Male DOB: August 03, 1923 Age: 87 Y 39 M Return Phone Number: [phone] (Primary) Address: City/ State/ Zip: [place]  [address] Client [facility] - Client Client Site [place] - Day Physician Crissie Sickles - MD Contact Type Call Who Is Calling Patient / Member / Family / Caregiver Call Type Triage / Clinical Caller Name Immanuel Fedak Relationship To Patient Spouse Return Phone Number [phone] (Primary) Chief Complaint Dizziness Reason for Call Symptomatic / Request for [name] states husband has fever 100.3, not sleeping well, dx demensia. Off balance/dizzy. Weak legs. Translation No Disp. Time Eilene Ghazi Time) Disposition Final User 11/27/2020 11:52:15 AM Attempt made - message left Raphael Gibney, RN, Vanita Ingles 11/27/2020 12:03:17 PM Attempt made - message left Raphael Gibney, RN, Vanita Ingles 11/27/2020 12:17:51 PM FINAL ATTEMPT MADE - message left Yes Raphael Gibney, RN, Vanita Ingles

## 2020-11-27 NOTE — Telephone Encounter (Signed)
Spoke with pt's wife, [name] regarding pt's current status; earlier temp was 100.3. Temp of 99.0 low grade fever while on the phone. He fell this morning, lost his balance and is having leg weakness. Dr.McGowen said it was ok for 1/2 tab of aspirin but she gave him whole aspirin today. Number provided for [facility] and [facility] for scheduling appt for evaluation. She was unsure if he was having any other symptoms because it's hard to tell with him.

## 2020-11-27 NOTE — Discharge Instructions (Addendum)
Please return if symptoms worsen as discussed. 

## 2020-11-27 NOTE — Telephone Encounter (Signed)
Wife returning call - someone called her earlier when she was getting her husband in the car  Please call back  [phone]

## 2020-11-27 NOTE — ED Provider Notes (Signed)
[facility] EMERGENCY DEPARTMENT Provider Note   CSN: [id number] Arrival date & time: 11/27/20  1545     History Chief Complaint  Patient presents with   Fever    Mitchell Abbott is a 87 y.o. male.  Level 5 caveat due to dementia.  Supposedly had a temporal temperature 100.3 adult daycare today.  Has not had any nausea or vomiting.  Has not been in any kind of distress per caregiver.  The history is provided by a caregiver.  Fever Max temp prior to arrival:  100.3 Temp source:  Temporal Progression:  Resolved Chronicity:  New Relieved by:  Nothing Worsened by:  Nothing     Past Medical History:  Diagnosis Date   Actinic keratoses    Adrenal hemorrhage ([place]) 2001   s/p fall from ladder--a f/u CT abd showed resolution of the hemorrhage- (duodenal polyp incidentally noted;  f/u EGD by Dr. Watt Climes 03/2000 showed small lipomatous mass in duodenum which was confirmed by biopsy.   Alzheimer's dementia ([place])    with behav disturbance and apathy.  [facility] referral as of 07/2013.  They added namenda 07/2014 due to pt's signif decline in the prior 6 mo.   Arthralgia of knee, right 2009   exam suspicious for meniscal origin   BPH (benign prostatic hypertrophy)    History of acute urinary retention (2006)  Hx of elevated PSA (7.26) + right sided prostate nodule on DRE by urologist.  PSA did not go down with tx with antibiotics--biopsy was done and showed NO MALIGNANCY but some chronic inflammation was present--turned out related to acute cystitis (enterococcus UTI).  PSA was 1.17 in 07/2011 at urologist's (Dr. Karsten Ro).  F/u at urol is now prn as of 08/2014.   BPPV (benign paroxysmal positional vertigo)    COVID-19 virus infection 06/09/2020   DDD (degenerative disc disease), cervical    C5-6, C6-7 primarily   Headache in back of head    Neurologist treated him with PT for this problem and it helped   Hearing deficit    Bilateral; noise damage in [employer]  Has had  hearing aids since about 2008.   Herpes zoster 07/25/2018   suspected--valtrex rx'd.   History of acute cystitis 08/2009   with acute prostatitis (enterococcus faecalis)   History of solitary pulmonary nodule    Initially noted on CT abd for his adrenal hemorrhage s/p fall from ladder 2001.  F/u CT chest 04/2000 showed that it had shrunk from 71mm to 45mm and had no malignant features.   History of TIA (transient ischemic attack) @ 2008   02/2001 and 01/2006 carotid dopplers showed 50% or less stenosis.   Left bundle branch hemiblock    mentioned in dx of old records 04/15/2009   Migraine headache with aura onset in teens   No migraines since about 2004   Poor dentition    As of 07/2019 being referred to oral surgeon for removal of all implants and all remaining teeth   RSV bronchitis 01/2020   Skin cancer     Patient Active Problem List   Diagnosis Date Noted   Lip swelling 02/23/2020   Hearing difficulty of both ears 08/21/2019   Urethral bleeding 09/02/2018   Rash 07/25/2018   Syncope 06/27/2016   Maxillary sinusitis 05/27/2015   Apathy 01/28/2015   Allergic rhinitis 01/20/2015   Bacterial conjunctivitis of left eye 11/05/2014   Alzheimer's disease with late onset ([place]) 01/15/2014   Chronic renal insufficiency, stage II (mild)  09/03/2013   Observation for suspected malignant neoplasm 09/03/2013   Preventative health care 09/03/2013   Orthostatic syncope 02/05/2013   Mild cognitive impairment 10/20/2012   Cervical myofascial strain 08/15/2012   Sacroiliac joint pain 02/18/2012   History of TIA (transient ischemic attack)    BPH (benign prostatic hypertrophy) 10/13/2011   Headache disorder 10/13/2011   Dementia ([place]) 10/13/2011    Past Surgical History:  Procedure Laterality Date   Carotid dopplers  05/2016   1-39% stenosis bilat ICA.   cataract extrac     dental     implants   PROSTATE BIOPSY  04/07/2005   For elevated PSA + right sided prostate nodule on DRE by  urologist:  benign biopsy.   TONSILLECTOMY  preteen   TRANSTHORACIC ECHOCARDIOGRAM  06/28/2016   EF 60-65%, mild dilatation of ascending aorta.       Family History  Problem Relation Age of Onset   Sudden death Mother 49       FH info obtained from Dr. Ovid Curd records.   Breast cancer Daughter    Migraines Daughter     Social History   Tobacco Use   Smoking status: Former    Years: 2.00    Pack years: 0.00    Types: Cigarettes   Smokeless tobacco: Never  Substance Use Topics   Alcohol use: Yes    Alcohol/week: 1.0 standard drink    Types: 1 Cans of beer per week    Comment: "a good drink every day" BEER/WINE   Drug use: No    Home Medications Prior to Admission medications   Medication Sig Start Date End Date Taking? Authorizing Provider  cephALEXin (KEFLEX) 500 MG capsule Take 1 capsule (500 mg total) by mouth 2 (two) times daily for 7 days. 11/27/20 12/04/20 Yes Xochilt Conant, DO  aspirin 325 MG EC tablet Take 1 tablet (325 mg total) by mouth daily. Patient taking differently: Take 325 mg by mouth daily. Pt takes 1/2 tab of 325mg . 10/20/12   Marcial Pacas, MD  fluticasone (CUTIVATE) 0.05 % cream Apply topically 2 (two) times daily. Apply to upper back rash Patient not taking: Reported on 06/20/2020 01/28/20   Tammi Sou, MD  mupirocin ointment Drue Stager) 2 %  07/28/18   [provider]  risperiDONE (RISPERDAL) 0.5 MG tablet Take 0.125-0.25 mg by mouth See admin instructions. 1/4 tablet in am and 1/2 tablet pm Patient not taking: Reported on 06/20/2020    [provider]  rivastigmine (EXELON) 13.3 MG/24HR Place 13.3 mg onto the skin daily. Patient not taking: Reported on 06/20/2020    [provider]  tamsulosin (FLOMAX) 0.4 MG CAPS capsule Take 2 capsules (0.8 mg total) by mouth daily after supper. Patient taking differently: Take 0.4 mg by mouth daily after supper. 08/11/16   McGowen, Adrian Blackwater, MD  terbinafine (LAMISIL) 1 % cream  12/21/18    [provider]  triamcinolone cream (KENALOG) 0.1 % Apply to rash twice daily Patient not taking: Reported on 06/20/2020 02/23/20   Kandra Nicolas, MD  Vitamins A & D (VITAMIN A & D) ointment Apply 1 application topically 2 (two) times daily.    [provider]  White Petrolatum (WHITE PETROLEUM JELLY) GEL Apply topically. 04/07/20   [provider]    Allergies    Doxycycline and Tessalon [benzonatate]  Review of Systems   Review of Systems  Unable to perform ROS: Dementia  Constitutional:  Positive for fever.   Physical Exam Updated Vital Signs  BP 137/66   Pulse 76   Temp 98.6 F (37 C) (Rectal)   Resp (!) 23   Ht 5\' 7"  (1.702 m)   Wt 64 kg   SpO2 100%   BMI 22.08 kg/m   Physical Exam Vitals and nursing note reviewed.  Constitutional:      General: He is not in acute distress.    Appearance: He is well-developed. He is not ill-appearing.  HENT:     Head: Normocephalic and atraumatic.     Nose: Nose normal.     Mouth/Throat:     Mouth: Mucous membranes are moist.  Eyes:     Extraocular Movements: Extraocular movements intact.     Conjunctiva/sclera: Conjunctivae normal.     Pupils: Pupils are equal, round, and reactive to light.  Cardiovascular:     Rate and Rhythm: Normal rate and regular rhythm.     Pulses: Normal pulses.     Heart sounds: Normal heart sounds. No murmur heard. Pulmonary:     Effort: Pulmonary effort is normal. No respiratory distress.     Breath sounds: Normal breath sounds.  Abdominal:     Palpations: Abdomen is soft.     Tenderness: There is no abdominal tenderness.  Musculoskeletal:     Cervical back: Neck supple.  Skin:    General: Skin is warm and dry.     Capillary Refill: Capillary refill takes less than 2 seconds.  Neurological:     General: No focal deficit present.     Mental Status: He is alert.    ED Results / Procedures / Treatments   Labs (all labs ordered are listed, but only abnormal results  are displayed) Labs Reviewed  CBC WITH DIFFERENTIAL/PLATELET - Abnormal; Notable for the following components:      Result Value   WBC 14.7 (*)    RBC 4.06 (*)    MCV 100.5 (*)    Neutro Abs 12.7 (*)    Abs Immature Granulocytes 0.08 (*)    All other components within normal limits  COMPREHENSIVE METABOLIC PANEL - Abnormal; Notable for the following components:   Glucose, Bld 139 (*)    BUN 24 (*)    Total Protein 6.1 (*)    All other components within normal limits  URINALYSIS, ROUTINE W REFLEX MICROSCOPIC - Abnormal; Notable for the following components:   APPearance CLOUDY (*)    Hgb urine dipstick SMALL (*)    Nitrite POSITIVE (*)    Leukocytes,Ua LARGE (*)    All other components within normal limits  LACTIC ACID, PLASMA - Abnormal; Notable for the following components:   Lactic Acid, Venous 3.1 (*)    All other components within normal limits  URINALYSIS, MICROSCOPIC (REFLEX) - Abnormal; Notable for the following components:   Bacteria, UA MANY (*)    All other components within normal limits  LACTIC ACID, PLASMA - Abnormal; Notable for the following components:   Lactic Acid, Venous 2.1 (*)    All other components within normal limits  RESP PANEL BY RT-PCR (FLU A&B, COVID) [id number]  URINE CULTURE  CULTURE, BLOOD (SINGLE)    EKG EKG Interpretation  Date/Time:  Thursday November 27 2020 16:28:37 EDT Ventricular Rate:  77 PR Interval:  192 QRS Duration: 119 QT Interval:  387 QTC Calculation: 438 R Axis:   -71 Text Interpretation: Sinus rhythm Left anterior fascicular block Confirmed by Lennice Sites ([id number]) on 11/27/2020 4:32:07 PM  Radiology DG Chest Portable 1 View  Result Date: 11/27/2020 CLINICAL DATA:  Fever and weakness for 1 day EXAM: PORTABLE CHEST 1 VIEW COMPARISON:  08/24/2019 FINDINGS: Cardiac shadow is within normal limits. Aortic calcifications are noted. Lungs are well aerated bilaterally. No focal infiltrate or sizable effusion is seen. No bony abnormality  is noted. IMPRESSION: No active disease. Electronically Signed   By: Inez Catalina M.D.   On: 11/27/2020 16:46    Procedures Procedures   Medications Ordered in ED Medications  haloperidol lactate (HALDOL) injection 2 mg (2 mg Intravenous Given 11/27/20 1609)  sodium chloride 0.9 % bolus 500 mL ( Intravenous Stopped 11/27/20 1708)  sodium chloride 0.9 % bolus 1,000 mL (0 mLs Intravenous Stopped 11/27/20 1801)  cefTRIAXone (ROCEPHIN) 2 g in sodium chloride 0.9 % 100 mL IVPB (0 g Intravenous Stopped 11/27/20 1821)    ED Course  I have reviewed the triage vital signs and the nursing notes.  Pertinent labs & imaging results that were available during my care of the patient were reviewed by me and considered in my medical decision making (see chart for details).    MDM Rules/Calculators/A&P                          BURLIE CAJAMARCA is here with fever.  Normal vitals.  No fever.  History of dementia.  Unable to provide history.  Here with caregiver/wife.  Patient was at his adult daycare today and had a temporal infrared temperature of 100.3.  Otherwise acting at his baseline.  Has not been given Tylenol or ibuprofen.  Rectal temperature here is normal.  We will pursue infectious work-up but not concern for sepsis at this time given normal vitals.  Overall he appears to be at his baseline.  Patient agitated while nursing staff get an IV started.  He takes respirdone on at home therefore we will give him IV Haldol to allow [place] to get work-up done.  Patient with mild leukocytosis of 14.  Lactic acid is 3.1.  However lab work otherwise is unremarkable.  Urinalysis appears consistent with infection.  COVID and flu test negative.  Chest x-ray negative for infection.  Vital signs are normal.  No tachycardia, no fever, normal blood pressure.  We will give him a dose of IV Rocephin and trend lactic acid.  Have low suspicion for sepsis at this time.  Lactic acid is improving to 2.1.  Vital signs are normal.   Patient has been able to eat and drink without any issues in the ED.  Overall he appears well.  Shared decision was made to discharge the patient home.  Have low concern for sepsis at this time.  We will follow-up blood culture.  They understand to return if symptoms worsen.  This chart was dictated using voice recognition software.  Despite best efforts to proofread,  errors can occur which can change the documentation meaning.   Final Clinical Impression(s) / ED Diagnoses Final diagnoses:  Acute cystitis without hematuria    Rx / DC Orders ED Discharge Orders          Ordered    cephALEXin (KEFLEX) 500 MG capsule  2 times daily        11/27/20 [place], [place], DO 11/27/20 1855

## 2020-11-30 LAB — URINE CULTURE: Culture: >=100000 — AB

## 2020-12-01 ENCOUNTER — Telehealth: Payer: Self-pay

## 2020-12-01 NOTE — Progress Notes (Signed)
ED Antimicrobial Stewardship Positive Culture Follow Up   Mitchell Abbott is an 87 y.o. male who presented to [facility] on 11/27/2020 with a chief complaint of fever, hx of dementia. UTI possible with UA positive for nitrites, large leukocytes and WBC > 50 Chief Complaint  Patient presents with   Fever    Recent Results (from the past 720 hour(s))  Culture, blood (single)     Status: None (Preliminary result)   Collection Time: 11/27/20  4:05 PM   Specimen: BLOOD LEFT FOREARM  Result Value Ref Range Status   Specimen Description   Final    BLOOD LEFT FOREARM Performed at [facility], [address] [place], [place] [address]    Special Requests   Final    BOTTLES DRAWN AEROBIC AND ANAEROBIC Blood Culture adequate volume Performed at [facility], [address] [place], [place] [address]    Culture   Final    NO GROWTH 3 DAYS Performed at [facility], [place] [address], [place], [place] [address]    Report Status PENDING  Incomplete  Resp Panel by RT-PCR (Flu A&B, Covid) Nasopharyngeal Swab     Status: None   Collection Time: 11/27/20  4:46 PM   Specimen: Nasopharyngeal Swab; Nasopharyngeal(NP) swabs in vial transport medium  Result Value Ref Range Status   SARS Coronavirus 2 by RT PCR NEGATIVE NEGATIVE Final    Comment: (NOTE) SARS-CoV-2 target nucleic acids are NOT DETECTED.  The SARS-CoV-2 RNA is generally detectable in upper respiratory specimens during the acute phase of infection. The lowest concentration of SARS-CoV-2 viral copies this assay can detect is 138 copies/mL. A negative result does not preclude SARS-Cov-2 infection and should not be used as the sole basis for treatment or other patient management decisions. A negative result may occur with  improper specimen collection/handling, submission of specimen other than nasopharyngeal swab, presence of viral mutation(s) within the areas targeted by this assay, and inadequate number  of viral copies(<138 copies/mL). A negative result must be combined with clinical observations, patient history, and epidemiological information. The expected result is Negative.  Fact Sheet for Patients:  EntrepreneurPulse.com.au  Fact Sheet for Healthcare Providers:  IncredibleEmployment.be  This test is no t yet approved or cleared by the [place] FDA and  has been authorized for detection and/or diagnosis of SARS-CoV-2 by FDA under an Emergency Use Authorization (EUA). This EUA will remain  in effect (meaning this test can be used) for the duration of the COVID-19 declaration under Section 564(b)(1) of the Act, 21 U.S.C.section 360bbb-3(b)(1), unless the authorization is terminated  or revoked sooner.       Influenza A by PCR NEGATIVE NEGATIVE Final   Influenza B by PCR NEGATIVE NEGATIVE Final    Comment: (NOTE) The Xpert Xpress SARS-CoV-2/FLU/RSV plus assay is intended as an aid in the diagnosis of influenza from Nasopharyngeal swab specimens and should not be used as a sole basis for treatment. Nasal washings and aspirates are unacceptable for Xpert Xpress SARS-CoV-2/FLU/RSV testing.  Fact Sheet for Patients: EntrepreneurPulse.com.au  Fact Sheet for Healthcare Providers: IncredibleEmployment.be  This test is not yet approved or cleared by the [place] FDA and has been authorized for detection and/or diagnosis of SARS-CoV-2 by FDA under an Emergency Use Authorization (EUA). This EUA will remain in effect (meaning this test can be used) for the duration of the COVID-19 declaration under Section 564(b)(1) of the Act, 21 U.S.C. section 360bbb-3(b)(1), unless the authorization  is terminated or revoked.  Performed at [facility], [address] [place], [place] [address]   Urine culture     Status: Abnormal   Collection Time: 11/27/20  4:59 PM   Specimen: Urine, Random   Result Value Ref Range Status   Specimen Description   Final    URINE, RANDOM Performed at [facility], [address] [place], [place] [address]    Special Requests   Final    NONE Performed at [facility], [address] [place], [place] [address]    Culture >=100,000 COLONIES/mL SERRATIA MARCESCENS (A)  Final   Report Status 11/30/2020 FINAL  Final   Organism ID, Bacteria SERRATIA MARCESCENS (A)  Final      Susceptibility   Serratia marcescens - MIC*    CEFAZOLIN >=64 RESISTANT Resistant     CEFEPIME <=0.12 SENSITIVE Sensitive     CEFTRIAXONE <=0.25 SENSITIVE Sensitive     CIPROFLOXACIN <=0.25 SENSITIVE Sensitive     GENTAMICIN <=1 SENSITIVE Sensitive     NITROFURANTOIN 256 RESISTANT Resistant     TRIMETH/SULFA <=20 SENSITIVE Sensitive     * >=100,000 COLONIES/mL SERRATIA MARCESCENS   [x]  Treated with cephalexin, organism resistant to prescribed antimicrobial []  Patient discharged originally without antimicrobial agent and treatment is now indicated  New antibiotic prescription: Discontinue cephalexin and prescribe Cefdinir 300mg  PO BID x 7 days  ED Provider: Octaviano Glow, MD  Joetta Manners, PharmD, [facility] Emergency Medicine Clinical Pharmacist Phone -  [phone]

## 2020-12-01 NOTE — Telephone Encounter (Signed)
Post ED Visit - Positive Culture Follow-up: Successful Patient Follow-Up  Culture assessed and recommendations reviewed by:  [x]  Joetta Manners, Pharm.D. []  Heide Guile, Pharm.D., BCPS AQ-ID []  Parks Neptune, Pharm.D., BCPS []  Alycia Rossetti, Pharm.D., BCPS []  [place], Pharm.D., BCPS, AAHIVP []  Legrand Como, Pharm.D., BCPS, AAHIVP []  Salome Arnt, PharmD, BCPS []  Johnnette Gourd, PharmD, BCPS []  Hughes Better, PharmD, BCPS []  Leeroy Cha, PharmD  Positive urine culture  []  Patient discharged without antimicrobial prescription and treatment is now indicated [x]  Organism is resistant to prescribed ED discharge antimicrobial []  Patient with positive blood cultures  Changes discussed with ED provider: Octaviano Glow, MD New antibiotic prescription Cefdihir  Attempting to contact pt.    Glennon Hamilton 12/01/2020, 9:02 AM

## 2020-12-02 LAB — CULTURE, BLOOD (SINGLE)
Culture: NO GROWTH
Special Requests: ADEQUATE

## 2020-12-05 ENCOUNTER — Telehealth: Payer: Self-pay

## 2020-12-05 NOTE — Telephone Encounter (Signed)
Spoke with pt wife regarding retesting pt UA from recent UTI. She was informed to wait a week after completing abx to retest urine and was sched for 7/14 with PCP. Wife had called today to inform me that pt was recently given another abx 2 days ago for infection in blood.   Wife would like to know if she can keep appt on 7/14 to retest UA or will she have to wait until current abx is out of pt system to retest urine. Please advise   Note: Wife sched appt on 7/21 just in case she has to wait an additional week due to schedule being full.

## 2020-12-05 NOTE — Telephone Encounter (Signed)
Pt's wife called stating that pt was on abx for UTI and was recently changed to a different one. She asked about dropping off a urine specimen for pt but I do not see any orders for use of urine. She requested to talk with Tori.

## 2020-12-07 NOTE — Telephone Encounter (Signed)
Reviewed ED visit from 11/27/20. Bld clx neg. Urine clx pos but not clear what abx pt was changed to. Pls ask wife what abx he was changed to. [place] for UA with culture (dx acute cystitis without hematuria).  Pls have him either give sample in office or if he's unable to give sample in office (severe dementia) then specimen collected at home in one of our clean cups ok if brought back in here pretty quick or refrigerated if not brought in pretty quick (1 hour).

## 2020-12-08 ENCOUNTER — Other Ambulatory Visit: Payer: Self-pay

## 2020-12-08 DIAGNOSIS — N3000 Acute cystitis without hematuria: Secondary | ICD-10-CM

## 2020-12-08 NOTE — Telephone Encounter (Signed)
FYI-Spoke with pt's wife, Deatra James and she will be coming to get urine cup to do collection and bring it back tomorrow. He is currently on cefdinir 300mg  started on 7/6-7/13. Future order in for UA with culture

## 2020-12-09 ENCOUNTER — Ambulatory Visit (INDEPENDENT_AMBULATORY_CARE_PROVIDER_SITE_OTHER): Payer: Medicare PPO

## 2020-12-09 ENCOUNTER — Other Ambulatory Visit: Payer: Self-pay

## 2020-12-09 ENCOUNTER — Telehealth: Payer: Self-pay

## 2020-12-09 DIAGNOSIS — N3000 Acute cystitis without hematuria: Secondary | ICD-10-CM

## 2020-12-09 NOTE — Telephone Encounter (Signed)
If pt is feeling and acting his normal baseline state of health than ok to cancel appt.

## 2020-12-09 NOTE — Telephone Encounter (Signed)
Please advise if pt still needs appt for 7/14

## 2020-12-09 NOTE — Telephone Encounter (Signed)
Rec'd voicemail from patient wife Tomko (DPR) about an upcoming appt and did patient need to still come.  Please advise [phone]

## 2020-12-10 NOTE — Telephone Encounter (Signed)
LM for pt's wife, [name] to return call regarding appt.

## 2020-12-10 NOTE — Telephone Encounter (Signed)
Spoke with pt's wife, [name] she would like to cancel appt for tomorrow and just wait for the urine culture results for further recommendations.

## 2020-12-11 ENCOUNTER — Ambulatory Visit: Payer: Medicare PPO | Admitting: Family Medicine

## 2020-12-11 LAB — URINALYSIS W MICROSCOPIC + REFLEX CULTURE
Bacteria, UA: NONE SEEN /HPF
Bilirubin Urine: NEGATIVE
Glucose, UA: NEGATIVE
Hgb urine dipstick: NEGATIVE
Ketones, ur: NEGATIVE
Leukocyte Esterase: NEGATIVE
Nitrites, Initial: NEGATIVE
Protein, ur: NEGATIVE
RBC / HPF: NONE SEEN /HPF (ref 0–2)
Specific Gravity, Urine: 1.016 (ref 1.001–1.035)
Squamous Epithelial / HPF: NONE SEEN /HPF (ref <=5)
pH: 5.5 (ref 5.0–8.0)

## 2020-12-11 LAB — URINE CULTURE
MICRO NUMBER:: 12114071
Result:: NO GROWTH
SPECIMEN QUALITY:: ADEQUATE

## 2020-12-11 LAB — NO CULTURE INDICATED

## 2020-12-18 ENCOUNTER — Other Ambulatory Visit: Payer: Self-pay

## 2020-12-18 ENCOUNTER — Encounter: Payer: Self-pay | Admitting: Family Medicine

## 2020-12-18 ENCOUNTER — Ambulatory Visit (INDEPENDENT_AMBULATORY_CARE_PROVIDER_SITE_OTHER): Payer: Medicare PPO | Admitting: Family Medicine

## 2020-12-18 VITALS — BP 148/87 | HR 61 | Wt 139.0 lb

## 2020-12-18 DIAGNOSIS — Z87440 Personal history of urinary (tract) infections: Secondary | ICD-10-CM | POA: Diagnosis not present

## 2020-12-18 DIAGNOSIS — G4730 Sleep apnea, unspecified: Secondary | ICD-10-CM | POA: Diagnosis not present

## 2020-12-18 DIAGNOSIS — F0390 Unspecified dementia without behavioral disturbance: Secondary | ICD-10-CM | POA: Diagnosis not present

## 2020-12-18 DIAGNOSIS — G4719 Other hypersomnia: Secondary | ICD-10-CM | POA: Diagnosis not present

## 2020-12-18 DIAGNOSIS — G4734 Idiopathic sleep related nonobstructive alveolar hypoventilation: Secondary | ICD-10-CM | POA: Diagnosis not present

## 2020-12-18 DIAGNOSIS — T07XXXA Unspecified multiple injuries, initial encounter: Secondary | ICD-10-CM

## 2020-12-18 NOTE — Progress Notes (Signed)
OFFICE VISIT  12/18/2020  CC:  Chief Complaint  Patient presents with   Shortness of Breath    Pt wife is her with pt and reports pt having dyspnea, snoring, and apnea; pt seen sleep specialist in past    Abrasion    Pt present with multiple abrasions on legs after ER visit 6/30   HPI:    Patient is a 87 y.o. Caucasian male with severe dementia who presents accompanied by his wife [name] for "sleep concerns". Wife describes some periods of short rapid breaths during sleep, also some slower breathing and question of apnea at times.  Says he seems to stay asleep well once he falls asleep.  No awakening with a gasp.   She uses a home oxygen sat machine and states she thinks his oxygen drops some during sleep intermittently but she can't give me any number today. Home sleep apnea test kit was given to pt by a specialist at the [place] in March this year but wife stated the readout was unclear and she thinks the wi-fi connection may have affected things (Watchpatone is the name of the device).  It is not clear whether or not he tolerated wearing this device (one upper chest probe and one finger probe --both attached to a wrist device). He is definitely sleepy all day but hard to tell if this is secondary to progressive dementia or a result of OSA.    No fevers, no cough. No n/v/d.  Of note, pt was brought to ED for fever 101 on 11/27/20 and w/u revealed bladder infection, serratia marcescens grew, approp abx given. Recheck of UA and culture here on 12/09/20 were NORMAL.  Past Medical History:  Diagnosis Date   Actinic keratoses    Adrenal hemorrhage ([place]) 2001   s/p fall from ladder--a f/u CT abd showed resolution of the hemorrhage- (duodenal polyp incidentally noted;  f/u EGD by Dr. Watt Climes 03/2000 showed small lipomatous mass in duodenum which was confirmed by biopsy.   Alzheimer's dementia ([place])    with behav disturbance and apathy.  [facility] referral as of [facility].  They added namenda  07/2014 due to pt's signif decline in the prior 6 mo.   Arthralgia of knee, right 2009   exam suspicious for meniscal origin   BPH (benign prostatic hypertrophy)    History of acute urinary retention (2006)  Hx of elevated PSA (7.26) + right sided prostate nodule on DRE by urologist.  PSA did not go down with tx with antibiotics--biopsy was done and showed NO MALIGNANCY but some chronic inflammation was present--turned out related to acute cystitis (enterococcus UTI).  PSA was 1.17 in 07/2011 at urologist's (Dr. Karsten Ro).  F/u at urol is now prn as of 08/2014.   BPPV (benign paroxysmal positional vertigo)    COVID-19 virus infection 06/09/2020   DDD (degenerative disc disease), cervical    C5-6, C6-7 primarily   Headache in back of head    Neurologist treated him with PT for this problem and it helped   Hearing deficit    Bilateral; noise damage in [employer]  Has had hearing aids since about 2008.   Herpes zoster 07/25/2018   suspected--valtrex rx'd.   History of acute cystitis 08/2009   with acute prostatitis (enterococcus faecalis)   History of solitary pulmonary nodule    Initially noted on CT abd for his adrenal hemorrhage s/p fall from ladder 2001.  F/u CT chest 04/2000 showed that it had shrunk from 54m to 657mand  had no malignant features.   History of TIA (transient ischemic attack) @ 2008   02/2001 and 01/2006 carotid dopplers showed 50% or less stenosis.   Left bundle branch hemiblock    mentioned in dx of old records 04/15/2009   Migraine headache with aura onset in teens   No migraines since about 2004   Poor dentition    As of 07/2019 being referred to oral surgeon for removal of all implants and all remaining teeth   RSV bronchitis 01/2020   Skin cancer     Past Surgical History:  Procedure Laterality Date   Carotid dopplers  05/2016   1-39% stenosis bilat ICA.   cataract extrac     dental     implants   PROSTATE BIOPSY  04/07/2005   For elevated PSA + right sided  prostate nodule on DRE by urologist:  benign biopsy.   TONSILLECTOMY  preteen   TRANSTHORACIC ECHOCARDIOGRAM  06/28/2016   EF 60-65%, mild dilatation of ascending aorta.    Outpatient Medications Prior to Visit  Medication Sig Dispense Refill   aspirin 325 MG EC tablet Take 1 tablet (325 mg total) by mouth daily. (Patient taking differently: Take 325 mg by mouth daily. Pt takes 1/2 tab of 385m.) 90 tablet 3   fluticasone (CUTIVATE) 0.05 % cream Apply topically 2 (two) times daily. Apply to upper back rash 30 g 0   mupirocin ointment (BACTROBAN) 2 %      risperiDONE (RISPERDAL) 0.5 MG tablet Take 0.125-0.25 mg by mouth See admin instructions. 1/4 tablet in am and 1/2 tablet pm     rivastigmine (EXELON) 13.3 MG/24HR Place 13.3 mg onto the skin daily.     tamsulosin (FLOMAX) 0.4 MG CAPS capsule Take 2 capsules (0.8 mg total) by mouth daily after supper. (Patient taking differently: Take 0.4 mg by mouth daily after supper.) 180 capsule 3   terbinafine (LAMISIL) 1 % cream      triamcinolone cream (KENALOG) 0.1 % Apply to rash twice daily 15 g 0   Vitamins A & D (VITAMIN A & D) ointment Apply 1 application topically 2 (two) times daily.     White Petrolatum (WHITE PETROLEUM JELLY) GEL Apply topically.     No facility-administered medications prior to visit.    Allergies  Allergen Reactions   Doxycycline Swelling    Lower lip swelling   Tessalon [Benzonatate] Swelling    Lower lip swelling   ROS As per HPI  PE: Vitals with BMI 12/18/2020 11/27/2020 11/27/2020  Height - - -  Weight 139 lbs - -  BMI 223.30- -  Systolic [id number] Diastolic 87 48 66  Pulse 61 79 76   [place] with nud[place].  Patient makes some noises with voice but no intelligible speech.  Makes eye contact sometimes.  Doesn't follow commands consistently. He is not agitated or combative. CV: RRR, no m/r/g.   LUNGS: CTA bilat, nonlabored resps, good aeration in all lung  fields. EXT: no clubbing or cyanosis.  no edema.  SKIN: upper back with diffuse blanching erythematous rash. No warmth, vesicles, pustules, or ulceration. Anterior tibial surfaces with some scabbed-over abrasions bilat, some mild deeper pink hue to inf 1/2 of ant tibial surface on L w/out warmth or streaking or tenderness.  Wife states this area was initially bigger and has gradually shrunk.   Ecchymoses scattered across pretibial surfaces of both LLs.   LABS:  Chemistry      Component Value Date/Time   NA 136 11/27/2020 1605   K 4.7 11/27/2020 1605   CL 104 11/27/2020 1605   CO2 24 11/27/2020 1605   BUN 24 (H) 11/27/2020 1605   CREATININE 1.07 11/27/2020 1605   CREATININE 1.02 09/05/2015 1528      Component Value Date/Time   CALCIUM 8.9 11/27/2020 1605   ALKPHOS 69 11/27/2020 1605   AST 29 11/27/2020 1605   ALT 14 11/27/2020 1605   BILITOT 0.9 11/27/2020 1605      IMPRESSION AND PLAN:  1) Sleep-disordered breathing, question sleep apnea->central vs obstructive. We discussed the fact that Gershon Mussel is very very unlikley/unable to tolerate any cpap device, therefore no further attempts to capture/diagnose sleep apnea is indicated. I suspect his daytime sleepiness is more behavioral (progressive dementia) than anything else. Regarding the question of nocturnal hypoxia, will go ahead and arrange for home overnight oxygen testing to see if supplemental oxygen during sleep is indicated.  2) Abrasions and ecchymoses bilat pretibial regions. Reassured. No sign of cellulitis.  3) Febrile UTI->11/27/20, abx resulted resolution of fever as well as clearing of urine appropriately.  Spent 33 min with pt today reviewing HPI, reviewing relevant past history, doing exam, reviewing and discussing lab and imaging data, and formulating plans.   An After Visit Summary was printed and given to the patient.  FOLLOW UP: Return for as needed.  Signed:  Crissie Sickles, MD           12/18/2020

## 2020-12-26 ENCOUNTER — Encounter: Payer: Self-pay | Admitting: Family Medicine

## 2020-12-26 ENCOUNTER — Telehealth (INDEPENDENT_AMBULATORY_CARE_PROVIDER_SITE_OTHER): Payer: Medicare PPO | Admitting: Family Medicine

## 2020-12-26 ENCOUNTER — Other Ambulatory Visit: Payer: Self-pay

## 2020-12-26 ENCOUNTER — Telehealth: Payer: Self-pay

## 2020-12-26 DIAGNOSIS — L821 Other seborrheic keratosis: Secondary | ICD-10-CM | POA: Insufficient documentation

## 2020-12-26 DIAGNOSIS — M7989 Other specified soft tissue disorders: Secondary | ICD-10-CM

## 2020-12-26 DIAGNOSIS — M25432 Effusion, left wrist: Secondary | ICD-10-CM

## 2020-12-26 NOTE — Telephone Encounter (Signed)
OK to put him on at end of day today.

## 2020-12-26 NOTE — Telephone Encounter (Signed)
[facility] - Client TELEPHONE ADVICE RECORD [facility] Patient Name: Mitchell Abbott Gender: Male DOB: 11-Oct-1923 Age: 87 Y 61 M 28 D Return Phone Number: [phone] (Primary) Address: City/ State/ Zip: [place]  [address] Client [facility] - Client Client Site [place] - Day Physician Crissie Sickles - MD Contact Type Call Who Is Calling Patient / Member / Family / Caregiver Call Type Triage / Clinical Caller Name Mitchell Abbott Relationship To Patient Spouse Return Phone Number [phone] (Primary) Chief Complaint Feet swelling Reason for Call Symptomatic / Request for [place] states her husband's wrist and left foot is swelling. Translation No Nurse Assessment Nurse: Ardine Bjork, RN, Melissa Date/Time (Eastern Time): 12/25/2020 4:36:49 PM Confirm and document reason for call. If symptomatic, describe symptoms. ---Caller states her husband's left wrist (and wrist and left foot (only toes and top of foot)is swelling. Sxs started last night or this am-denies falls or injuries. Denies fever, cough or sob or pain. Does the patient have any new or worsening symptoms? ---Yes Will a triage be completed? ---Yes Related visit to physician within the last 2 weeks? ---No Does the PT have any chronic conditions? (i.e. diabetes, asthma, this includes High risk factors for pregnancy, etc.) ---Yes List chronic conditions. ---Alzheimer's Dis. Is this a behavioral health or substance abuse call? ---No Guidelines Guideline Title Affirmed Question Affirmed Notes Nurse Date/Time Eilene Ghazi Time) Leg Swelling and Edema [1] Thigh, calf, or ankle swelling AND [2] only 1 side Ardine Bjork, RN, Melissa 12/25/2020 4:40:33 PM Disp. Time Eilene Ghazi Time) Disposition Final User 12/25/2020 4:47:03 PM See HCP within 4 Hours (or PCP triage) Yes Zayas, RN, Melissa PLEASE NOTE: All  timestamps contained within this report are represented as [place] Standard Time. CONFIDENTIALTY NOTICE: This fax transmission is intended only for the addressee. It contains information that is legally privileged, confidential or otherwise protected from use or disclosure. If you are not the intended recipient, you are strictly prohibited from reviewing, disclosing, copying using or disseminating any of this information or taking any action in reliance on or regarding this information. If you have received this fax in error, please notify us immediately by telephone so that we can arrange for its return to [place]. Phone: [phone], Toll-Free: [phone], Fax: [phone] Page: 2 of 2 Call Id: [id number] [place] Disagree/Comply Comply Caller Understands Yes PreDisposition Did not know what to do Care Advice Given Per Guideline SEE HCP (OR PCP TRIAGE) WITHIN 4 HOURS: * IF OFFICE WILL BE OPEN: You need to be seen within the next 3 or 4 hours. Call your doctor (or NP/PA) now or as soon as the office opens. CALL EMS IF: * Chest pain or shortness of breath occurs. CARE ADVICE given per Leg Swelling and Edema (Adult) guideline. Comments User: Dub Mikes, RN Date/Time Eilene Ghazi Time): 12/25/2020 4:52:10 PM Call to backline-adult male states is number to [facility] lab-call to office contact and request to speak with nurse, Adolm Joseph, at office-was told to send to [facility] or ED, whichever spouse prefers User: Dub Mikes, RN Date/Time Eilene Ghazi Time): 12/25/2020 4:53:11 PM Spouse informed of office response. User: Dub Mikes, RN Date/Time Eilene Ghazi Time): 12/25/2020 4:54:22 PM Spouse verbalizes understanding of office response-states she prefers to call office in am for appt. Referrals Warm transfer to backline

## 2020-12-26 NOTE — Progress Notes (Signed)
Virtual Visit via Video Note  I connected withNAME pt's wife [name] on 12/26/20 at  4:00 PM EDT by a video enabled telemedicine application and verified that I am speaking with the correct person using two identifiers.  Location patient: home, [place] Location provider:work or home office Persons participating in the virtual visit: patient, provider  I discussed the limitations of evaluation and management by telemedicine and the availability of in person appointments. The patient expressed understanding and agreed to proceed.   HPI: Patient is a 87 y.o. Caucasian male with severe dementia who is being seen accompanied by his wife [name] for "left foot and wrist swelling".  Wife gives all history d/t pt dementia and nonverbal. I last saw him 8 days ago. A/P as of that visit: "1) Sleep-disordered breathing, question sleep apnea->central vs obstructive. We discussed the fact that Gershon Mussel is very very unlikley/unable to tolerate any cpap device, therefore no further attempts to capture/diagnose sleep apnea is indicated. I suspect his daytime sleepiness is more behavioral (progressive dementia) than anything else. Regarding the question of nocturnal hypoxia, will go ahead and arrange for home overnight oxygen testing to see if supplemental oxygen during sleep is indicated (suspect he may be able to tolerate nasal cannulae during sleep).   2) Abrasions and ecchymoses bilat pretibial regions. Reassured. No sign of cellulitis.   3) Febrile UTI->11/27/20, abx resulted resolution of fever as well as clearing of urine appropriately."  INTERIM HX: Wife noted his L wrist and L ankle slightly swollen yesterday, put salon pas patches on wrist, elevated legs and applied ice to wrist---everything improved today. No known injury or overuse.  Some bruising on dorsal aspect of L hand/wrist.  No erythema.  Moving wrist, hand, legs, feet as per his normal.   Past Medical History:  Diagnosis Date   Actinic keratoses     Adrenal hemorrhage ([place]) 2001   s/p fall from ladder--a f/u CT abd showed resolution of the hemorrhage- (duodenal polyp incidentally noted;  f/u EGD by Dr. Watt Climes 03/2000 showed small lipomatous mass in duodenum which was confirmed by biopsy.   Alzheimer's dementia ([place])    with behav disturbance and apathy.  [facility] referral as of 07/[facility].  They added namenda 07/2014 due to pt's signif decline in the prior 6 mo.   Arthralgia of knee, right 2009   exam suspicious for meniscal origin   BPH (benign prostatic hypertrophy)    History of acute urinary retention (2006)  Hx of elevated PSA (7.26) + right sided prostate nodule on DRE by urologist.  PSA did not go down with tx with antibiotics--biopsy was done and showed NO MALIGNANCY but some chronic inflammation was present--turned out related to acute cystitis (enterococcus UTI).  PSA was 1.17 in 07/2011 at urologist's (Dr. Karsten Ro).  F/u at urol is now prn as of 08/2014.   BPPV (benign paroxysmal positional vertigo)    COVID-19 virus infection 06/09/2020   DDD (degenerative disc disease), cervical    C5-6, C6-7 primarily   Headache in back of head    Neurologist treated him with PT for this problem and it helped   Hearing deficit    Bilateral; noise damage in [employer]  Has had hearing aids since about 2008.   Herpes zoster 07/25/2018   suspected--valtrex rx'd.   History of acute cystitis 08/2009   with acute prostatitis (enterococcus faecalis)   History of solitary pulmonary nodule    Initially noted on CT abd for his adrenal hemorrhage s/p fall from ladder  2001.  F/u CT chest 04/2000 showed that it had shrunk from 93m to 633mand had no malignant features.   History of TIA (transient ischemic attack) @ 2008   02/2001 and 01/2006 carotid dopplers showed 50% or less stenosis.   Left bundle branch hemiblock    mentioned in dx of old records 04/15/2009   Migraine headache with aura onset in teens   No migraines since about 2004   Poor  dentition    As of 07/2019 being referred to oral surgeon for removal of all implants and all remaining teeth   RSV bronchitis 01/2020   Skin cancer     Past Surgical History:  Procedure Laterality Date   Carotid dopplers  05/2016   1-39% stenosis bilat ICA.   cataract extrac     dental     implants   PROSTATE BIOPSY  04/07/2005   For elevated PSA + right sided prostate nodule on DRE by urologist:  benign biopsy.   TONSILLECTOMY  preteen   TRANSTHORACIC ECHOCARDIOGRAM  06/28/2016   EF 60-65%, mild dilatation of ascending aorta.     Current Outpatient Medications:    aspirin 325 MG EC tablet, Take 1 tablet (325 mg total) by mouth daily. (Patient taking differently: Take 325 mg by mouth daily. Pt takes 1/2 tab of '325mg'$ .), Disp: 90 tablet, Rfl: 3   tamsulosin (FLOMAX) 0.4 MG CAPS capsule, Take 2 capsules (0.8 mg total) by mouth daily after supper. (Patient taking differently: Take 0.4 mg by mouth daily after supper.), Disp: 180 capsule, Rfl: 3   Vitamins A & D (VITAMIN A & D) ointment, Apply 1 application topically 2 (two) times daily., Disp: , Rfl:    fluticasone (CUTIVATE) 0.05 % cream, Apply topically 2 (two) times daily. Apply to upper back rash (Patient not taking: Reported on 12/26/2020), Disp: 30 g, Rfl: 0   mupirocin ointment (BACTROBAN) 2 %, , Disp: , Rfl:    risperiDONE (RISPERDAL) 0.5 MG tablet, Take 0.125-0.25 mg by mouth See admin instructions. 1/4 tablet in am and 1/2 tablet pm (Patient not taking: Reported on 12/26/2020), Disp: , Rfl:    rivastigmine (EXELON) 13.3 MG/24HR, Place 13.3 mg onto the skin daily. (Patient not taking: Reported on 12/26/2020), Disp: , Rfl:    terbinafine (LAMISIL) 1 % cream, , Disp: , Rfl:    triamcinolone cream (KENALOG) 0.1 %, Apply to rash twice daily (Patient not taking: Reported on 12/26/2020), Disp: 15 g, Rfl: 0   White Petrolatum (WHITE PETROLEUM JELLY) GEL, Apply topically. (Patient not taking: Reported on 12/26/2020), Disp: , Rfl:    EXAM:  VITALS per patient if applicable:  Vitals with BMI 12/18/2020 11/27/2020 11/27/2020  Height - - -  Weight 139 lbs - -  BMI 21Q000111Q -  Systolic [id number] 87 48 66  Pulse 61 79 76     GENERAL: alert alternating with eyes closed and napping-->this is his baseline  HEENT: atraumatic, conjunttiva clear, no obvious abnormalities on inspection of external nose and ears  NECK: normal movements of the head and neck  LUNGS: on inspection no signs of respiratory distress, breathing rate appears normal, no obvious gross SOB, gasping or wheezing  CV: no obvious cyanosis  L wrist w/out discernable swelling or erythema.  Pink hand and fingers. Some ecchymoses on dorsal aspect of both hands/wrists.  Unable to examine legs/feet today b/c pt was sitting in a car.  LABS: none today  ASSESSMENT AND PLAN:  Discussed the following assessment  and plan:  Acute L wrist and L ankle swelling-->not discernable on inspection during this video visit. No sign of trauma, no sign of HF, no sign of infection or thrombosis. Reassured, monitor sx's, cont ice and salon pas prn. Signs/symptoms to call or return for were reviewed and pt expressed understanding.   I discussed the assessment and treatment plan with the patient. The patient was provided an opportunity to ask questions and all were answered. The patient agreed with the plan and demonstrated an understanding of the instructions.   F/u: if not improving in 4-5d  Signed:  Crissie Sickles, MD           12/26/2020

## 2020-12-26 NOTE — Telephone Encounter (Signed)
Any available work in slots for today?  

## 2020-12-26 NOTE — Telephone Encounter (Signed)
Please schedule pt for 4:15, in office.

## 2021-01-05 ENCOUNTER — Encounter (HOSPITAL_BASED_OUTPATIENT_CLINIC_OR_DEPARTMENT_OTHER): Payer: Self-pay | Admitting: Urology

## 2021-01-05 ENCOUNTER — Telehealth: Payer: Self-pay

## 2021-01-05 ENCOUNTER — Emergency Department (HOSPITAL_BASED_OUTPATIENT_CLINIC_OR_DEPARTMENT_OTHER)
Admission: EM | Admit: 2021-01-05 | Discharge: 2021-01-05 | Disposition: A | Discharge status: Home / Self Care | Payer: Medicare PPO | Attending: Emergency Medicine | Admitting: Emergency Medicine

## 2021-01-05 DIAGNOSIS — Z87891 Personal history of nicotine dependence: Secondary | ICD-10-CM | POA: Insufficient documentation

## 2021-01-05 DIAGNOSIS — Z7982 Long term (current) use of aspirin: Secondary | ICD-10-CM | POA: Insufficient documentation

## 2021-01-05 DIAGNOSIS — G309 Alzheimer's disease, unspecified: Secondary | ICD-10-CM | POA: Insufficient documentation

## 2021-01-05 DIAGNOSIS — K120 Recurrent oral aphthae: Secondary | ICD-10-CM | POA: Diagnosis not present

## 2021-01-05 DIAGNOSIS — Z8616 Personal history of COVID-19: Secondary | ICD-10-CM | POA: Diagnosis not present

## 2021-01-05 DIAGNOSIS — K029 Dental caries, unspecified: Secondary | ICD-10-CM | POA: Insufficient documentation

## 2021-01-05 DIAGNOSIS — Z85828 Personal history of other malignant neoplasm of skin: Secondary | ICD-10-CM | POA: Insufficient documentation

## 2021-01-05 DIAGNOSIS — R041 Hemorrhage from throat: Secondary | ICD-10-CM | POA: Diagnosis present

## 2021-01-05 MED ORDER — AMOXICILLIN-POT CLAVULANATE 875-125 MG PO TABS: 1.0000 | ORAL_TABLET | Freq: Two times a day (BID) | ORAL | 0 refills | Status: DC

## 2021-01-05 MED ORDER — NYSTATIN 100000 UNIT/ML MT SUSP: 5.0000 mL | Freq: Three times a day (TID) | OROMUCOSAL | 0 refills | Status: DC | PRN

## 2021-01-05 MED ORDER — LIDOCAINE VISCOUS HCL 2 % MT SOLN
15.0000 mL | Freq: Once | OROMUCOSAL | Status: AC
  Administered 2021-01-05: 15 mL via ORAL
  Filled 2021-01-05: qty 15

## 2021-01-05 MED ORDER — AMOXICILLIN-POT CLAVULANATE 400-57 MG/5ML PO SUSR: 800.0000 mg | Freq: Two times a day (BID) | ORAL | 0 refills | Status: DC

## 2021-01-05 MED ORDER — AMOXICILLIN-POT CLAVULANATE 875-125 MG PO TABS
1.0000 | ORAL_TABLET | Freq: Once | ORAL | Status: AC
  Administered 2021-01-05: 1 via ORAL
  Filled 2021-01-05: qty 1

## 2021-01-05 MED ORDER — ALUM & MAG HYDROXIDE-SIMETH 200-200-20 MG/5ML PO SUSP
30.0000 mL | Freq: Once | ORAL | Status: AC
  Administered 2021-01-05: 30 mL via ORAL
  Filled 2021-01-05: qty 30

## 2021-01-05 MED ORDER — POLYMYXIN B-TRIMETHOPRIM 10000-0.1 UNIT/ML-% OP SOLN
2.0000 [drp] | OPHTHALMIC | Status: DC
  Filled 2021-01-05: qty 10

## 2021-01-05 NOTE — Telephone Encounter (Signed)
Pt is currently on the way to [place] ED for evaluation.  [facility] - Client TELEPHONE ADVICE RECORD [facility] Patient Name: [facility] Gender: Male DOB: [birth year] Age: 87 Y 5 M 8 D Return Phone Number: [phone] (Primary) Address: City/ State/ Zip: [place]  [address] Client [facility] - Client Client Site [place] - Day Physician Crissie Sickles - MD Contact Type Call Who Is Calling Patient / Member / Family / Caregiver Call Type Triage / Clinical Caller Name [name] Relationship To Patient Spouse Return Phone Number [phone] (Primary) Chief Complaint Coughing Up Blood Reason for Call Symptomatic / Request for [place] states her husband has late stage of Alzheimer's disease. Saturday care giver stated he bled clear blood from his mouth that later turned red. His cheek is swollen and looks a cyst has ruptured. Translation No Nurse Assessment Nurse: Vallery Sa, RN, Cathy Date/Time (Eastern Time): 01/05/2021 2:24:14 PM Confirm and document reason for call. If symptomatic, describe symptoms. ---[name] states that her husband has swelling on his cheek that they think burst two days ago. He spit out a large amount of clear fluid and bright red blood two days ago. No severe breathing or swallowing difficulty. He developed low grade fever 2 nights ago. No known pain. Alert and responsive. Does the patient have any new or worsening symptoms? ---Yes Will a triage be completed? ---Yes Related visit to physician within the last 2 weeks? ---No Does the PT have any chronic conditions? (i.e. diabetes, asthma, this includes High risk factors for pregnancy, etc.) ---Yes List chronic conditions. ---Alzheimer's disease, Prostate Enlargement Is this a behavioral health or substance abuse call? ---No Guidelines Guideline Title Affirmed Question Affirmed  Notes Nurse Date/Time (Eastern Time) Coughing Up Blood Unclear to triager if the patient is coughing up blood or vomiting blood Vallery Sa, RN, [place] 01/05/2021 2:29:41 PM PLEASE NOTE: All timestamps contained within this report are represented as [place] Standard Time. CONFIDENTIALTY NOTICE: This fax transmission is intended only for the addressee. It contains information that is legally privileged, confidential or otherwise protected from use or disclosure. If you are not the intended recipient, you are strictly prohibited from reviewing, disclosing, copying using or disseminating any of this information or taking any action in reliance on or regarding this information. If you have received this fax in error, please notify us immediately by telephone so that we can arrange for its return to [place]. Phone: [phone], Toll-Free: [phone], Fax: [phone] Page: 2 of 2 Call Id: [id number] [place]. Time Eilene Ghazi Time) Disposition Final User 01/05/2021 2:35:42 PM Go to ED Now Yes Vallery Sa, RN, [place] Disagree/Comply Disagree Caller Understands Yes PreDisposition Did not know what to do Care Advice Given Per Guideline GO TO ED NOW: * You need to be seen in the Emergency Department. * Go to the ED at ___________ [place] now. Drive carefully. SAMPLE: * Bring in a sample of the blood for testing (R/O false positive). BRING MEDICINES: * Please bring a list of your current medicines when you go to see the doctor. * It is also a good idea to bring the pill bottles too. This will help the doctor to make certain you are taking the right medicines and the right dose. CARE ADVICE given per Coughing Up Blood guideline. Comments User: Berton Mount, RN Date/Time Eilene Ghazi Time): 01/05/2021 2:41:37 PM [name] declined the Go to ER disposition. Reinforced the Go to ER  disposition. She states they just can't take him to the Er again and want to see if MD can do anything to help. Called the office  backline and updated Santiago Glad. Connected [name] with Santiago Glad. Referrals GO TO FACILITY REFUSED

## 2021-01-05 NOTE — Telephone Encounter (Signed)
Noted  

## 2021-01-05 NOTE — ED Notes (Signed)
rx for antiobiotic was changed to liquid per care giver request

## 2021-01-05 NOTE — ED Triage Notes (Signed)
Blood from mouth on Saturday per caregiver, Fever on Saturday night, feeling better today, called pcp and was told to come here

## 2021-01-05 NOTE — ED Provider Notes (Signed)
Mitchell Abbott Provider Note   CSN: [id number] Arrival date & time: 01/05/21  1629     History Chief Complaint  Patient presents with   Fever   oral bleeding     Mitchell Abbott is a 87 y.o. male history of dementia, dentures here presenting with bleeding from the mouth.  Patient is brought to the ED by his wife.  His wife was away during this weekend.  He has a caregiver that takes care of him.  Per the caregiver, he had an episode of bleeding from his mouth less than a tablespoon. They called the doctor today was brought in for evaluation.  Patient has dentures.  Per the wife, he appears to be in pain when he chews.  There was concern for possible dental infection.  Patient has no fever at home.  Patient is on baby aspirin   The history is provided by the patient.      Past Medical History:  Diagnosis Date   Actinic keratoses    Adrenal hemorrhage ([place]) 2001   s/p fall from ladder--a f/u CT abd showed resolution of the hemorrhage- (duodenal polyp incidentally noted;  f/u EGD by Dr. Watt Climes 03/2000 showed small lipomatous mass in duodenum which was confirmed by biopsy.   Alzheimer's dementia ([place])    with behav disturbance and apathy.  [facility] referral as [facility].  They added namenda 07/2014 due to pt's signif decline in the prior 6 mo.   Arthralgia of knee, right 2009   exam suspicious for meniscal origin   BPH (benign prostatic hypertrophy)    History of acute urinary retention (2006)  Hx of elevated PSA (7.26) + right sided prostate nodule on DRE by urologist.  PSA did not go down with tx with antibiotics--biopsy was done and showed NO MALIGNANCY but some chronic inflammation was present--turned out related to acute cystitis (enterococcus UTI).  PSA was 1.17 in 07/2011 at urologist's (Dr. Karsten Ro).  F/u at urol is now prn as of 08/2014.   BPPV (benign paroxysmal positional vertigo)    COVID-19 virus infection 06/09/2020   DDD (degenerative  disc disease), cervical    C5-6, C6-7 primarily   Headache in back of head    Neurologist treated him with PT for this problem and it helped   Hearing deficit    Bilateral; noise damage in [employer]  Has had hearing aids since about 2008.   Herpes zoster 07/25/2018   suspected--valtrex rx'd.   History of acute cystitis 08/2009   with acute prostatitis (enterococcus faecalis)   History of solitary pulmonary nodule    Initially noted on CT abd for his adrenal hemorrhage s/p fall from ladder 2001.  F/u CT chest 04/2000 showed that it had shrunk from 65m to 638mand had no malignant features.   History of TIA (transient ischemic attack) @ 2008   02/2001 and 01/2006 carotid dopplers showed 50% or less stenosis.   Left bundle branch hemiblock    mentioned in dx of old records 04/15/2009   Migraine headache with aura onset in teens   No migraines since about 2004   Poor dentition    As of 07/2019 being referred to oral surgeon for removal of all implants and all remaining teeth   RSV bronchitis 01/2020   Skin cancer     Patient Active Problem List   Diagnosis Date Noted   Seborrheic keratosis 12/26/2020   Lip swelling 02/23/2020   Hearing difficulty of both ears  08/21/2019   Urethral bleeding 09/02/2018   Rash 07/25/2018   Syncope 06/27/2016   Maxillary sinusitis 05/27/2015   Apathy 01/28/2015   Allergic rhinitis 01/20/2015   Bacterial conjunctivitis of left eye 11/05/2014   Alzheimer's disease with late onset ([place]) 01/15/2014   Chronic renal insufficiency, stage II (mild) 09/03/2013   Observation for suspected malignant neoplasm 09/03/2013   Preventative health care 09/03/2013   Orthostatic syncope 02/05/2013   Mild cognitive impairment 10/20/2012   Cervical myofascial strain 08/15/2012   Sacroiliac joint pain 02/18/2012   History of TIA (transient ischemic attack)    BPH (benign prostatic hypertrophy) 10/13/2011   Headache disorder 10/13/2011   Dementia ([place]) 10/13/2011     Past Surgical History:  Procedure Laterality Date   Carotid dopplers  05/2016   1-39% stenosis bilat ICA.   cataract extrac     dental     implants   PROSTATE BIOPSY  04/07/2005   For elevated PSA + right sided prostate nodule on DRE by urologist:  benign biopsy.   TONSILLECTOMY  preteen   TRANSTHORACIC ECHOCARDIOGRAM  06/28/2016   EF 60-65%, mild dilatation of ascending aorta.       Family History  Problem Relation Age of Onset   Sudden death Mother 82       FH info obtained from Dr. Ovid Curd records.   Breast cancer Daughter    Migraines Daughter     Social History   Tobacco Use   Smoking status: Former    Years: 2.00    Types: Cigarettes   Smokeless tobacco: Never  Substance Use Topics   Alcohol use: Yes    Alcohol/week: 1.0 standard drink    Types: 1 Cans of beer per week    Comment: "a good drink every day" BEER/WINE   Drug use: No    Home Medications Prior to Admission medications   Medication Sig Start Date End Date Taking? Authorizing Provider  aspirin 325 MG EC tablet Take 1 tablet (325 mg total) by mouth daily. Patient taking differently: Take 325 mg by mouth daily. Pt takes 1/2 tab of '325mg'$ . 10/20/12   Marcial Pacas, MD  fluticasone (CUTIVATE) 0.05 % cream Apply topically 2 (two) times daily. Apply to upper back rash Patient not taking: Reported on 12/26/2020 01/28/20   Tammi Sou, MD  mupirocin ointment Drue Stager) 2 %  07/28/18   [provider]  risperiDONE (RISPERDAL) 0.5 MG tablet Take 0.125-0.25 mg by mouth See admin instructions. 1/4 tablet in am and 1/2 tablet pm Patient not taking: Reported on 12/26/2020    [provider]  rivastigmine (EXELON) 13.3 MG/24HR Place 13.3 mg onto the skin daily. Patient not taking: Reported on 12/26/2020    [provider]  tamsulosin (FLOMAX) 0.4 MG CAPS capsule Take 2 capsules (0.8 mg total) by mouth daily after supper. Patient taking differently: Take 0.4 mg by mouth daily after  supper. 08/11/16   McGowen, Adrian Blackwater, MD  terbinafine (LAMISIL) 1 % cream  12/21/18   [provider]  triamcinolone cream (KENALOG) 0.1 % Apply to rash twice daily Patient not taking: Reported on 12/26/2020 02/23/20   Kandra Nicolas, MD  Vitamins A & D (VITAMIN A & D) ointment Apply 1 application topically 2 (two) times daily.    [provider]  White Petrolatum (WHITE PETROLEUM JELLY) GEL Apply topically. Patient not taking: Reported on 12/26/2020 04/07/20   [provider]    Allergies    Doxycycline and Tessalon [benzonatate]  Review  of Systems   Review of Systems  HENT:         Bleeding from mouth  All other systems reviewed and are negative.  Physical Exam Updated Vital Signs BP 131/80 (BP Location: Right Arm)   Pulse 81   Temp 97.7 F (36.5 C) (Oral)   Resp 18   Ht '5\' 7"'$  (1.702 m)   Wt 63 kg   SpO2 98%   BMI 21.77 kg/m   Physical Exam Vitals and nursing note reviewed.  Constitutional:      Comments: Chronically ill-appearing, demented  HENT:     Head: Normocephalic.     Mouth/Throat:      Comments: Patient has upper dentures.  Patient has aphthous ulcers under the tongue on the right side and also on the right buccal mucosa.  Patient missing multiple teeth.  Patient does have dental caries.  No obvious periapical abscess Cardiovascular:     Rate and Rhythm: Normal rate.     Pulses: Normal pulses.  Pulmonary:     Effort: Pulmonary effort is normal.     Breath sounds: Normal breath sounds.  Abdominal:     General: Abdomen is flat.     Palpations: Abdomen is soft.  Musculoskeletal:        General: Normal range of motion.     Cervical back: Normal range of motion and neck supple.  Skin:    General: Skin is warm.     Capillary Refill: Capillary refill takes less than 2 seconds.  Neurological:     Comments: Demented and unable to answer questions.  Moving all extremities  Psychiatric:        Mood and Affect: Mood normal.    ED  Results / Procedures / Treatments   Labs (all labs ordered are listed, but only abnormal results are displayed) Labs Reviewed - No data to display  EKG None  Radiology No results found.  Procedures Procedures   Medications Ordered in ED Medications  alum & mag hydroxide-simeth (MAALOX/MYLANTA) 200-200-20 MG/5ML suspension 30 mL (has no administration in time range)    And  lidocaine (XYLOCAINE) 2 % viscous mouth solution 15 mL (has no administration in time range)  amoxicillin-clavulanate (AUGMENTIN) 875-125 MG per tablet 1 tablet (has no administration in time range)    ED Course  I have reviewed the triage vital signs and the nursing notes.  Pertinent labs & imaging results that were available during my care of the patient were reviewed by me and considered in my medical decision making (see chart for details).    MDM Rules/Calculators/A&P                          NASHTON KWASNIEWSKI is a 87 y.o. male here with bleeding from the mouth.  I think likely bleeding from aphthous ulcers.  He does have multiple cavities I wonder if he has dental infection as well.  He does have dentures on the top but no obvious bleeding from the soft palate.  Patient is afebrile.  Will discharge home with a course of Augmentin and also will give [facility] Magic mouthwash for aphthous ulcers   Final Clinical Impression(s) / ED Diagnoses Final diagnoses:  None    Rx / DC Orders ED Discharge Orders     None        Drenda Freeze, MD 01/05/21 1746

## 2021-01-05 NOTE — Discharge Instructions (Addendum)
Please try Magic mouthwash swish and spit three times daily to help control pain from the ulcers   You may also have dental infection so please take Augmentin twice daily for a week  You should also see your dentist as well  Follow-up with your primary care doctor  Return to ER if you have uncontrolled bleeding, fever, cough, trouble breathing

## 2021-01-07 ENCOUNTER — Ambulatory Visit (INDEPENDENT_AMBULATORY_CARE_PROVIDER_SITE_OTHER): Payer: Medicare PPO | Admitting: Family Medicine

## 2021-01-07 ENCOUNTER — Telehealth: Payer: Self-pay

## 2021-01-07 ENCOUNTER — Other Ambulatory Visit: Payer: Self-pay

## 2021-01-07 ENCOUNTER — Encounter: Payer: Self-pay | Admitting: Family Medicine

## 2021-01-07 VITALS — BP 127/70 | HR 49 | Temp 97.7°F | Ht 67.0 in | Wt 136.0 lb

## 2021-01-07 DIAGNOSIS — N3001 Acute cystitis with hematuria: Secondary | ICD-10-CM | POA: Diagnosis not present

## 2021-01-07 DIAGNOSIS — N401 Enlarged prostate with lower urinary tract symptoms: Secondary | ICD-10-CM

## 2021-01-07 DIAGNOSIS — N138 Other obstructive and reflux uropathy: Secondary | ICD-10-CM

## 2021-01-07 DIAGNOSIS — R338 Other retention of urine: Secondary | ICD-10-CM | POA: Diagnosis not present

## 2021-01-07 MED ORDER — CEFTRIAXONE SODIUM 500 MG IJ SOLR
500.0000 mg | Freq: Once | INTRAMUSCULAR | Status: AC
  Administered 2021-01-07 (×2): 500 mg via INTRAMUSCULAR

## 2021-01-07 MED ORDER — CEFTRIAXONE SODIUM 1 G IJ SOLR: 1.0000 g | Freq: Once | INTRAMUSCULAR | Status: DC

## 2021-01-07 NOTE — Addendum Note (Signed)
Addended by: Octaviano Glow on: 01/07/2021 05:19 PM   Modules accepted: Orders

## 2021-01-07 NOTE — Telephone Encounter (Signed)
Pt scheduled for today.  

## 2021-01-07 NOTE — Telephone Encounter (Signed)
[place] [facility] - Client TELEPHONE ADVICE RECORD [facility] Patient Name: [facility] Gender: Male DOB: 1924/05/31 Age: 87 Y 44 M 9 D Return Phone Number: [phone] (Primary) Address: City/ State/ Zip: [place]  [address] Client [facility] - Client Client Site [place] - Day Physician Crissie Sickles - MD Contact Type Call Who Is Calling Patient / Member / Family / Caregiver Call Type Triage / Clinical Caller Name [name] Relationship To Patient Spouse Return Phone Number [phone] (Primary) Chief Complaint Abdominal Pain Reason for Call Symptomatic / Request for Health Information Initial Comment Pt has a bulge in their stomach. Translation No Nurse Assessment Nurse: Della Goo, RN, Vicente Males Date/Time (Eastern Time): 01/06/2021 4:41:08 PM Confirm and document reason for call. If symptomatic, describe symptoms. ---Caller states husband has a hard bulge in his stomach. Appeared this morning. States his belly is normally flat but now is the size of a medicine ball. Also reports fatigue. Denies pain. Pt was at ER yesterday for cheek swelling and mouth bleeding, he had a 102 fever, started abx. Temp this morning was 98.2 (temporal). Denies vomiting, reports constipation. Does the patient have any new or worsening symptoms? ---Yes Will a triage be completed? ---Yes Related visit to physician within the last 2 weeks? ---Yes Does the PT have any chronic conditions? (i.e. diabetes, asthma, this includes High risk factors for pregnancy, etc.) ---Yes List chronic conditions. ---alzheimers (nonverbal), enlarged prostate Is this a behavioral health or substance abuse call? ---No Guidelines Guideline Title Affirmed Question Affirmed Notes Nurse Date/Time (Eastern Time) Constipation Abdomen is more swollen than usual Della Goo, RN, Vicente Males 01/06/2021 4:48:34 PM Disp. Time Eilene Ghazi Time) Disposition Final  User PLEASE NOTE: All timestamps contained within this report are represented as [place] Standard Time. CONFIDENTIALTY NOTICE: This fax transmission is intended only for the addressee. It contains information that is legally privileged, confidential or otherwise protected from use or disclosure. If you are not the intended recipient, you are strictly prohibited from reviewing, disclosing, copying using or disseminating any of this information or taking any action in reliance on or regarding this information. If you have received this fax in error, please notify us immediately by telephone so that we can arrange for its return to [place]. Phone: [phone], Toll-Free: [phone], Fax: [phone] Page: 2 of 2 Call Id: [id number] 01/06/2021 4:55:20 PM See HCP within 4 Hours (or PCP triage) Yes Della Goo, RN, Vicente Males Disposition Overriden: See PCP within 24 Hours Override Reason: Patient's symptoms need a higher level of care Caller Disagree/Comply Disagree Caller Understands Yes PreDisposition Call Doctor Care Advice Given Per Guideline SEE HCP (OR PCP TRIAGE) WITHIN 4 HOURS: * IF OFFICE WILL BE CLOSED AND NO PCP (PRIMARY CARE PROVIDER) SECOND-LEVEL TRIAGE: You need to be seen within the next 3 or 4 hours. A nearby Urgent Care Center [facility]) is often a good source of care. Another choice is to go to the ED. Go sooner if you become worse. * UCC: Some UCCs can manage patients who are stable and have less serious symptoms (e.g., minor illnesses and injuries). The triager must know the [facility] capabilities before sending a patient there. If unsure, call ahead. CARE ADVICE given per Constipation (Adult) guideline. * You become worse CALL BACK IF: NOTHING BY MOUTH: * Do not eat or drink anything for now. Referrals GO TO FACILITY REFUSED

## 2021-01-07 NOTE — Progress Notes (Signed)
OFFICE VISIT  01/07/2021  CC:  Chief Complaint  Patient presents with   Hard bulge in stomach    Mid abd LQ    HPI:    Patient is a 87 y.o. Caucasian male with advanced dementia who presents accompanied by his wife [name] for "hard bulge in his stomach". He also recent went to the [facility] ED for blood coming from his mouth, was felt to possibly have bleeding from aphthous ulcers--[facility] MM rx'd. He also had multiple dental caries and dental infection could not be ruled out so he was rx'd augmentin.  HPI: Wife recently noticed he had lower abd diffuse "bulging" that felt firm. He went to urol this morning and they found that he had 2L of urine retention. Had a BM today before coming here, small and hard.  Had normal bm yesterday. Urol is sending specimen of urine for c/s and is ordering a renal ultrasound on him. They left foley in.  Wife has emptied 500 ml red tinged urine today.  Wife says he hasn't been on the mouthwash yet, just salt water rinse. No fevers.  She says he is acting a bit more like the chewing is not as painful as it was. No n/v/d.  Past Medical History:  Diagnosis Date   Actinic keratoses    Adrenal hemorrhage ([place]) 2001   s/p fall from ladder--a f/u CT abd showed resolution of the hemorrhage- (duodenal polyp incidentally noted;  f/u EGD by Dr. Watt Climes 03/2000 showed small lipomatous mass in duodenum which was confirmed by biopsy.   Alzheimer's dementia ([place])    with behav disturbance and apathy.  [facility] referral as of 07/[facility].  They added namenda 07/2014 due to pt's signif decline in the prior 6 mo.   Arthralgia of knee, right 2009   exam suspicious for meniscal origin   BPH (benign prostatic hypertrophy)    History of acute urinary retention (2006)  Hx of elevated PSA (7.26) + right sided prostate nodule on DRE by urologist.  PSA did not go down with tx with antibiotics--biopsy was done and showed NO MALIGNANCY but some chronic inflammation was  present--turned out related to acute cystitis (enterococcus UTI).  PSA was 1.17 in 07/2011 at urologist's (Dr. Karsten Ro).  F/u at urol is now prn as of 08/2014.   BPPV (benign paroxysmal positional vertigo)    COVID-19 virus infection 06/09/2020   DDD (degenerative disc disease), cervical    C5-6, C6-7 primarily   Headache in back of head    Neurologist treated him with PT for this problem and it helped   Hearing deficit    Bilateral; noise damage in [employer].  Has had hearing aids since about 2008.   Herpes zoster 07/25/2018   suspected--valtrex rx'd.   History of acute cystitis 08/2009   with acute prostatitis (enterococcus faecalis)   History of solitary pulmonary nodule    Initially noted on CT abd for his adrenal hemorrhage s/p fall from ladder 2001.  F/u CT chest 04/2000 showed that it had shrunk from 67m to 642mand had no malignant features.   History of TIA (transient ischemic attack) @ 2008   02/2001 and 01/2006 carotid dopplers showed 50% or less stenosis.   Left bundle branch hemiblock    mentioned in dx of old records 04/15/2009   Migraine headache with aura onset in teens   No migraines since about 2004   Poor dentition    As of 07/2019 being referred to oral surgeon  for removal of all implants and all remaining teeth   RSV bronchitis 01/2020   Skin cancer     Past Surgical History:  Procedure Laterality Date   Carotid dopplers  05/2016   1-39% stenosis bilat ICA.   cataract extrac     dental     implants   PROSTATE BIOPSY  04/07/2005   For elevated PSA + right sided prostate nodule on DRE by urologist:  benign biopsy.   TONSILLECTOMY  preteen   TRANSTHORACIC ECHOCARDIOGRAM  06/28/2016   EF 60-65%, mild dilatation of ascending aorta.    Outpatient Medications Prior to Visit  Medication Sig Dispense Refill   amoxicillin-clavulanate (AUGMENTIN) 400-57 MG/5ML suspension Take 10 mLs (800 mg total) by mouth 2 (two) times daily. 150 mL 0   aspirin 325 MG EC tablet Take 1  tablet (325 mg total) by mouth daily. (Patient taking differently: Take 325 mg by mouth daily. Pt takes 1/2 tab of '325mg'$ .) 90 tablet 3   tamsulosin (FLOMAX) 0.4 MG CAPS capsule Take 2 capsules (0.8 mg total) by mouth daily after supper. (Patient taking differently: Take 0.4 mg by mouth daily after supper.) 180 capsule 3   terbinafine (LAMISIL) 1 % cream      Vitamins A & D (VITAMIN A & D) ointment Apply 1 application topically 2 (two) times daily.     fluticasone (CUTIVATE) 0.05 % cream Apply topically 2 (two) times daily. Apply to upper back rash (Patient not taking: No sig reported) 30 g 0   magic mouthwash (nystatin, lidocaine, diphenhydrAMINE, alum & mag hydroxide) suspension Swish and spit 5 mLs 3 (three) times daily as needed for mouth pain. (Patient not taking: Reported on 01/07/2021) 180 mL 0   mupirocin ointment (BACTROBAN) 2 %  (Patient not taking: No sig reported)     risperiDONE (RISPERDAL) 0.5 MG tablet Take 0.125-0.25 mg by mouth See admin instructions. 1/4 tablet in am and 1/2 tablet pm (Patient not taking: No sig reported)     rivastigmine (EXELON) 13.3 MG/24HR Place 13.3 mg onto the skin daily. (Patient not taking: No sig reported)     triamcinolone cream (KENALOG) 0.1 % Apply to rash twice daily (Patient not taking: No sig reported) 15 g 0   White Petrolatum (WHITE PETROLEUM JELLY) GEL Apply topically. (Patient not taking: No sig reported)     No facility-administered medications prior to visit.    Allergies  Allergen Reactions   Doxycycline Swelling    Lower lip swelling   Tessalon [Benzonatate] Swelling    Lower lip swelling    ROS As per HPI  PE: Vitals with BMI 01/07/2021 01/05/2021 12/18/2020  Height '5\' 7"'$  '5\' 7"'$  -  Weight 136 lbs 139 lbs 139 lbs  BMI 21.3 [id number] Q000111Q  Systolic [id number]  Diastolic 70 80 87  Pulse 49 81 61  Gen: alert, moaning a bit more than his usual.  No distress. Mouth: a few small superficial ulcerations in R lower gingiva/buccal mucosa  intersection. Some stringy white spittle throughout oral cavity, question of distinct thrush-type plaque appearance. Neck: no adenopathy. CV: RRR (75-80 by me).  Distant S1 and S2. No m/r/g. Chest is clear, no wheezing or rales. Normal symmetric air entry throughout both lung fields. No chest wall deformities or tenderness. ABD: soft, NT, ND, BS normal.  No hepatospenomegaly or mass.  No bruits. Foley bag with 15 ml dark yellow urine, a few small clots in tubing. EXT: no clubbing or cyanosis.  no edema.  LABS:  Lab Results  Component Value Date   WBC 14.7 (H) 11/27/2020   HGB 13.6 11/27/2020   HCT 40.8 11/27/2020   MCV 100.5 (H) 11/27/2020   PLT 239 11/27/2020     Chemistry      Component Value Date/Time   NA 136 11/27/2020 1605   K 4.7 11/27/2020 1605   CL 104 11/27/2020 1605   CO2 24 11/27/2020 1605   BUN 24 (H) 11/27/2020 1605   CREATININE 1.07 11/27/2020 1605   CREATININE 1.02 09/05/2015 1528      Component Value Date/Time   CALCIUM 8.9 11/27/2020 1605   ALKPHOS 69 11/27/2020 1605   AST 29 11/27/2020 1605   ALT 14 11/27/2020 1605   BILITOT 0.9 11/27/2020 1605      IMPRESSION AND PLAN:  1) Aphthous ulcers, question of dental infection. Wife seems to think this is overall improved since being on amoxil. She will pick up [facility] magic mouthwash tomorrow. Finish amoxil as rx'd.  2) Acute urinary retention: hx of bph and is on flomax 0.'8mg'$  qhs chronically. Urology put foley in and is in process of evaluating his urine and they have ordered renal ultrasound to assess for hydronephrosis. His renal function 11/27/20 was normal. Wife will work with pt on better hydration. I gave rocephin 1g IM here today b/c I suspect he may have lower UTI and amoxil may not cover it well. Urol should have urine c/s info back in 24-36h.  Spent 33 min with pt today reviewing HPI, reviewing relevant past history, doing exam, reviewing and discussing lab and imaging data, and formulating  plans.  An After Visit Summary was printed and given to the patient.  FOLLOW UP: Return for 7-10d f/u urinary retention.  Signed:  Crissie Sickles, MD           01/07/2021

## 2021-01-11 ENCOUNTER — Emergency Department (HOSPITAL_BASED_OUTPATIENT_CLINIC_OR_DEPARTMENT_OTHER): Payer: Medicare PPO

## 2021-01-11 ENCOUNTER — Other Ambulatory Visit: Payer: Self-pay

## 2021-01-11 ENCOUNTER — Encounter (HOSPITAL_BASED_OUTPATIENT_CLINIC_OR_DEPARTMENT_OTHER): Payer: Self-pay

## 2021-01-11 ENCOUNTER — Inpatient Hospital Stay (HOSPITAL_BASED_OUTPATIENT_CLINIC_OR_DEPARTMENT_OTHER)
Admission: EM | Admit: 2021-01-11 | Discharge: 2021-01-17 | DRG: 698 | Disposition: A | Discharge status: Home Health | Payer: Medicare PPO | Attending: Internal Medicine | Admitting: Internal Medicine

## 2021-01-11 DIAGNOSIS — Z87891 Personal history of nicotine dependence: Secondary | ICD-10-CM | POA: Diagnosis not present

## 2021-01-11 DIAGNOSIS — N390 Urinary tract infection, site not specified: Secondary | ICD-10-CM | POA: Diagnosis present

## 2021-01-11 DIAGNOSIS — Z803 Family history of malignant neoplasm of breast: Secondary | ICD-10-CM

## 2021-01-11 DIAGNOSIS — F0281 Dementia in other diseases classified elsewhere with behavioral disturbance: Secondary | ICD-10-CM | POA: Diagnosis present

## 2021-01-11 DIAGNOSIS — Z79899 Other long term (current) drug therapy: Secondary | ICD-10-CM

## 2021-01-11 DIAGNOSIS — T83511A Infection and inflammatory reaction due to indwelling urethral catheter, initial encounter: Secondary | ICD-10-CM | POA: Diagnosis present

## 2021-01-11 DIAGNOSIS — Z6820 Body mass index (BMI) 20.0-20.9, adult: Secondary | ICD-10-CM

## 2021-01-11 DIAGNOSIS — F0280 Dementia in other diseases classified elsewhere without behavioral disturbance: Secondary | ICD-10-CM | POA: Diagnosis not present

## 2021-01-11 DIAGNOSIS — R410 Disorientation, unspecified: Secondary | ICD-10-CM | POA: Diagnosis not present

## 2021-01-11 DIAGNOSIS — Z66 Do not resuscitate: Secondary | ICD-10-CM | POA: Diagnosis present

## 2021-01-11 DIAGNOSIS — Z888 Allergy status to other drugs, medicaments and biological substances status: Secondary | ICD-10-CM

## 2021-01-11 DIAGNOSIS — R627 Adult failure to thrive: Secondary | ICD-10-CM | POA: Diagnosis present

## 2021-01-11 DIAGNOSIS — J9601 Acute respiratory failure with hypoxia: Secondary | ICD-10-CM | POA: Diagnosis present

## 2021-01-11 DIAGNOSIS — Z8616 Personal history of COVID-19: Secondary | ICD-10-CM | POA: Diagnosis not present

## 2021-01-11 DIAGNOSIS — Z7982 Long term (current) use of aspirin: Secondary | ICD-10-CM

## 2021-01-11 DIAGNOSIS — A419 Sepsis, unspecified organism: Secondary | ICD-10-CM | POA: Diagnosis present

## 2021-01-11 DIAGNOSIS — N400 Enlarged prostate without lower urinary tract symptoms: Secondary | ICD-10-CM | POA: Diagnosis present

## 2021-01-11 DIAGNOSIS — A4152 Sepsis due to Pseudomonas: Secondary | ICD-10-CM | POA: Diagnosis present

## 2021-01-11 DIAGNOSIS — E43 Unspecified severe protein-calorie malnutrition: Secondary | ICD-10-CM | POA: Diagnosis present

## 2021-01-11 DIAGNOSIS — N401 Enlarged prostate with lower urinary tract symptoms: Secondary | ICD-10-CM | POA: Diagnosis present

## 2021-01-11 DIAGNOSIS — Z881 Allergy status to other antibiotic agents status: Secondary | ICD-10-CM

## 2021-01-11 DIAGNOSIS — Z7401 Bed confinement status: Secondary | ICD-10-CM | POA: Diagnosis not present

## 2021-01-11 DIAGNOSIS — R0602 Shortness of breath: Secondary | ICD-10-CM | POA: Diagnosis not present

## 2021-01-11 DIAGNOSIS — R338 Other retention of urine: Secondary | ICD-10-CM | POA: Diagnosis present

## 2021-01-11 DIAGNOSIS — G301 Alzheimer's disease with late onset: Secondary | ICD-10-CM | POA: Diagnosis present

## 2021-01-11 DIAGNOSIS — R509 Fever, unspecified: Secondary | ICD-10-CM | POA: Diagnosis not present

## 2021-01-11 DIAGNOSIS — Z8673 Personal history of transient ischemic attack (TIA), and cerebral infarction without residual deficits: Secondary | ICD-10-CM | POA: Diagnosis not present

## 2021-01-11 DIAGNOSIS — R000 Tachycardia, unspecified: Secondary | ICD-10-CM | POA: Diagnosis not present

## 2021-01-11 DIAGNOSIS — Y846 Urinary catheterization as the cause of abnormal reaction of the patient, or of later complication, without mention of misadventure at the time of the procedure: Secondary | ICD-10-CM | POA: Diagnosis present

## 2021-01-11 DIAGNOSIS — G4730 Sleep apnea, unspecified: Secondary | ICD-10-CM | POA: Diagnosis not present

## 2021-01-11 DIAGNOSIS — J9811 Atelectasis: Secondary | ICD-10-CM | POA: Diagnosis present

## 2021-01-11 LAB — CBC WITH DIFFERENTIAL/PLATELET
Abs Immature Granulocytes: 0.28 10*3/uL — ABNORMAL HIGH (ref 0.00–0.07)
Basophils Absolute: 0.1 10*3/uL (ref 0.0–0.1)
Basophils Relative: 1 %
Eosinophils Absolute: 0.1 10*3/uL (ref 0.0–0.5)
Eosinophils Relative: 1 %
HCT: 39.5 % (ref 39.0–52.0)
Hemoglobin: 13.2 g/dL (ref 13.0–17.0)
Immature Granulocytes: 2 %
Lymphocytes Relative: 11 %
Lymphs Abs: 1.7 10*3/uL (ref 0.7–4.0)
MCH: 33.2 pg (ref 26.0–34.0)
MCHC: 33.4 g/dL (ref 30.0–36.0)
MCV: 99.2 fL (ref 80.0–100.0)
Monocytes Absolute: 1.7 10*3/uL — ABNORMAL HIGH (ref 0.1–1.0)
Monocytes Relative: 11 %
Neutro Abs: 11.4 10*3/uL — ABNORMAL HIGH (ref 1.7–7.7)
Neutrophils Relative %: 74 %
Platelets: 327 10*3/uL (ref 150–400)
RBC: 3.98 MIL/uL — ABNORMAL LOW (ref 4.22–5.81)
RDW: 13.2 % (ref 11.5–15.5)
WBC: 15.3 10*3/uL — ABNORMAL HIGH (ref 4.0–10.5)
nRBC: 0 % (ref 0.0–0.2)

## 2021-01-11 LAB — COMPREHENSIVE METABOLIC PANEL
ALT: 30 U/L (ref 0–44)
AST: 35 U/L (ref 15–41)
Albumin: 2.9 g/dL — ABNORMAL LOW (ref 3.5–5.0)
Alkaline Phosphatase: 74 U/L (ref 38–126)
Anion gap: 9 (ref 5–15)
BUN: 25 mg/dL — ABNORMAL HIGH (ref 8–23)
CO2: 25 mmol/L (ref 22–32)
Calcium: 8.6 mg/dL — ABNORMAL LOW (ref 8.9–10.3)
Chloride: 101 mmol/L (ref 98–111)
Creatinine, Ser: 1.07 mg/dL (ref 0.61–1.24)
GFR, Estimated: >60 mL/min (ref >60)
Glucose, Bld: 119 mg/dL — ABNORMAL HIGH (ref 70–99)
Potassium: 4.8 mmol/L (ref 3.5–5.1)
Sodium: 135 mmol/L (ref 135–145)
Total Bilirubin: 0.4 mg/dL (ref 0.3–1.2)
Total Protein: 6.4 g/dL — ABNORMAL LOW (ref 6.5–8.1)

## 2021-01-11 LAB — URINALYSIS, MICROSCOPIC (REFLEX): RBC / HPF: >50 RBC/hpf (ref 0–5)

## 2021-01-11 LAB — URINALYSIS, ROUTINE W REFLEX MICROSCOPIC
Bilirubin Urine: NEGATIVE
Glucose, UA: NEGATIVE mg/dL
Ketones, ur: NEGATIVE mg/dL
Leukocytes,Ua: NEGATIVE
Nitrite: NEGATIVE
Protein, ur: 100 mg/dL — AB
Specific Gravity, Urine: >1.03 — ABNORMAL HIGH (ref 1.005–1.030)
pH: 5.5 (ref 5.0–8.0)

## 2021-01-11 LAB — LACTIC ACID, PLASMA
Lactic Acid, Venous: 2.8 mmol/L (ref 0.5–1.9)
Lactic Acid, Venous: 1.8 mmol/L (ref 0.5–1.9)

## 2021-01-11 LAB — RESP PANEL BY RT-PCR (FLU A&B, COVID) ARPGX2
Influenza A by PCR: NEGATIVE
Influenza B by PCR: NEGATIVE
SARS Coronavirus 2 by RT PCR: NEGATIVE

## 2021-01-11 LAB — BRAIN NATRIURETIC PEPTIDE: B Natriuretic Peptide: 140.7 pg/mL — ABNORMAL HIGH (ref 0.0–100.0)

## 2021-01-11 LAB — CBG MONITORING, ED: Glucose-Capillary: 128 mg/dL — ABNORMAL HIGH (ref 70–99)

## 2021-01-11 LAB — TROPONIN I (HIGH SENSITIVITY): Troponin I (High Sensitivity): 13 ng/L (ref <18)

## 2021-01-11 MED ORDER — ACETAMINOPHEN 160 MG/5ML PO SOLN
650.0000 mg | Freq: Once | ORAL | Status: AC
  Administered 2021-01-11: 650 mg via ORAL
  Filled 2021-01-11: qty 20.3

## 2021-01-11 MED ORDER — ACETAMINOPHEN 650 MG RE SUPP: 650.0000 mg | Freq: Four times a day (QID) | RECTAL | Status: DC | PRN

## 2021-01-11 MED ORDER — SODIUM CHLORIDE 0.9 % IV SOLN: INTRAVENOUS | Status: DC | PRN

## 2021-01-11 MED ORDER — SODIUM CHLORIDE 0.9 % IV BOLUS: 1000.0000 mL | Freq: Once | INTRAVENOUS | Status: DC

## 2021-01-11 MED ORDER — SODIUM CHLORIDE 0.9 % IV SOLN
1.0000 g | Freq: Once | INTRAVENOUS | Status: AC
  Administered 2021-01-11: 1 g via INTRAVENOUS
  Filled 2021-01-11: qty 10

## 2021-01-11 MED ORDER — LORAZEPAM 2 MG/ML IJ SOLN
1.0000 mg | Freq: Once | INTRAMUSCULAR | Status: AC
  Administered 2021-01-11: 1 mg via INTRAVENOUS
  Filled 2021-01-11: qty 1

## 2021-01-11 NOTE — ED Notes (Signed)
Become agitated and combative when touched. Not able to do compete assessment. Wife at bedside.

## 2021-01-11 NOTE — ED Notes (Signed)
Tr

## 2021-01-11 NOTE — ED Notes (Signed)
More manageble and not combative since IV Ativan.  Wife remains at bedside.

## 2021-01-11 NOTE — ED Triage Notes (Signed)
Wife reports has a temp of 100.2 reports weakness and difficulty walking.

## 2021-01-11 NOTE — ED Notes (Signed)
Dentures removed and placed in denture cup with patient label.

## 2021-01-11 NOTE — ED Provider Notes (Signed)
[facility] EMERGENCY DEPARTMENT Provider Note   CSN: [id number] Arrival date & time: 01/11/21  1929     History Chief Complaint  Patient presents with   Fever    With shortness of breath per wife     Mitchell Abbott is a 87 y.o. male.  HPI Patient with severe Alzheimer's dementia with behavioral disturbance, history of TIA, poor dentition, BPH with urinary retention as far back as 2006  Patient is nonverbal.  Level 5 caveat due to dementia/nonverbal status.  Discussed with patient and his wife who is at bedside.  Patient was seen 1 week ago and treated with Augmentin for aphthous ulcers with concern for infection in his mouth.  He was started on this 8/8.  Patient allegedly has had some intermittent fevers Monday through today.  Seem to be more agitated prior to seeing the urologist on Wednesday when he was seen had a Foley catheter replaced and had nearly 2 L of urine output was not started on antibiotics by urology.  Per wife patient has been increasingly more agitated and fatigued and is no longer walking around is eating less and seems to be breathing harder over the past few days since the urology appointment.  She states that he has had a fever at home.  Wife states that patient is DNR/DNI.  Has paperwork at home but did not bring it.    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 Watt Climes 03/2000 showed small lipomatous mass in duodenum which was confirmed by biopsy.   Alzheimer's dementia ([place])    with behav disturbance and apathy.  [facility] referral as of 07/2013.  They added namenda 07/2014 due to pt's signif decline in the prior 6 mo.   Arthralgia of knee, right 2009   exam suspicious for meniscal origin   BPH (benign prostatic hypertrophy)    History of acute urinary retention  (2006)  Hx of elevated PSA (7.26) + right sided prostate nodule on DRE by urologist.  PSA did not go down with tx with antibiotics--biopsy was done and showed NO MALIGNANCY but some chronic inflammation was present--turned out related to acute cystitis (enterococcus UTI).  PSA was 1.17 in 07/2011 at urologist's (Dr. Karsten Ro).  F/u at urol is now prn as of 08/2014.   BPPV (benign paroxysmal positional vertigo)    COVID-19 virus infection 06/09/2020   DDD (degenerative disc disease), cervical    C5-6, C6-7 primarily   Headache in back of head    Neurologist treated him with PT for this problem and it helped   Hearing deficit    Bilateral; noise damage in [employer]  Has had hearing aids since about 2008.   Herpes zoster 07/25/2018   suspected--valtrex rx'd.   History of acute cystitis 08/2009   with acute prostatitis (enterococcus faecalis)   History of solitary pulmonary nodule    Initially noted on CT abd for his adrenal hemorrhage s/p fall from ladder 2001.  F/u CT chest 04/2000 showed that it had shrunk from 33m to 670mand had no malignant features.   History of TIA (transient ischemic attack) @ 2008   02/2001 and 01/2006 carotid dopplers showed 50% or less stenosis.   Left bundle branch hemiblock    mentioned in dx of old records 04/15/2009   Migraine  headache with aura onset in teens   No migraines since about 2004   Poor dentition    As of 07/2019 being referred to oral surgeon for removal of all implants and all remaining teeth   RSV bronchitis 01/2020   Skin cancer     Patient Active Problem List   Diagnosis Date Noted   Seborrheic keratosis 12/26/2020   Lip swelling 02/23/2020   Hearing difficulty of both ears 08/21/2019   Urethral bleeding 09/02/2018   Rash 07/25/2018   Syncope 06/27/2016   Maxillary sinusitis 05/27/2015   Apathy 01/28/2015   Allergic rhinitis 01/20/2015   Bacterial conjunctivitis of left eye 11/05/2014   Alzheimer's disease with late onset ([place]) 01/15/2014    Chronic renal insufficiency, stage II (mild) 09/03/2013   Observation for suspected malignant neoplasm 09/03/2013   Preventative health care 09/03/2013   Orthostatic syncope 02/05/2013   Mild cognitive impairment 10/20/2012   Cervical myofascial strain 08/15/2012   Sacroiliac joint pain 02/18/2012   History of TIA (transient ischemic attack)    BPH (benign prostatic hypertrophy) 10/13/2011   Headache disorder 10/13/2011   Dementia ([place]) 10/13/2011    Past Surgical History:  Procedure Laterality Date   Carotid dopplers  05/2016   1-39% stenosis bilat ICA.   cataract extrac     dental     implants   PROSTATE BIOPSY  04/07/2005   For elevated PSA + right sided prostate nodule on DRE by urologist:  benign biopsy.   TONSILLECTOMY  preteen   TRANSTHORACIC ECHOCARDIOGRAM  06/28/2016   EF 60-65%, mild dilatation of ascending aorta.       Family History  Problem Relation Age of Onset   Sudden death Mother 68       FH info obtained from Dr. Ovid Curd records.   Breast cancer Daughter    Migraines Daughter     Social History   Tobacco Use   Smoking status: Former    Years: 2.00    Types: Cigarettes   Smokeless tobacco: Never  Substance Use Topics   Alcohol use: Yes    Alcohol/week: 1.0 standard drink    Types: 1 Cans of beer per week    Comment: "a good drink every day" BEER/WINE   Drug use: No    Home Medications Prior to Admission medications   Medication Sig Start Date End Date Taking? Authorizing Provider  amoxicillin-clavulanate (AUGMENTIN) 400-57 MG/5ML suspension Take 10 mLs (800 mg total) by mouth 2 (two) times daily. 01/05/21   Drenda Freeze, MD  aspirin 325 MG EC tablet Take 1 tablet (325 mg total) by mouth daily. Patient taking differently: Take 325 mg by mouth daily. Pt takes 1/2 tab of '325mg'$ . 10/20/12   Marcial Pacas, MD  fluticasone (CUTIVATE) 0.05 % cream Apply topically 2 (two) times daily. Apply to upper back rash Patient not taking: No sig reported  01/28/20   McGowen, Adrian Blackwater, MD  magic mouthwash (nystatin, lidocaine, diphenhydrAMINE, alum & mag hydroxide) suspension Swish and spit 5 mLs 3 (three) times daily as needed for mouth pain. Patient not taking: Reported on 01/07/2021 01/05/21   Drenda Freeze, MD  mupirocin ointment Drue Stager) 2 %  07/28/18   [provider]  risperiDONE (RISPERDAL) 0.5 MG tablet Take 0.125-0.25 mg by mouth See admin instructions. 1/4 tablet in am and 1/2 tablet pm Patient not taking: No sig reported    [provider]  rivastigmine (EXELON) 13.3 MG/24HR Place 13.3 mg onto the skin daily. Patient not taking: No  sig reported    [provider]  tamsulosin (FLOMAX) 0.4 MG CAPS capsule Take 2 capsules (0.8 mg total) by mouth daily after supper. Patient taking differently: Take 0.4 mg by mouth daily after supper. 08/11/16   McGowen, Adrian Blackwater, MD  terbinafine (LAMISIL) 1 % cream  12/21/18   [provider]  triamcinolone cream (KENALOG) 0.1 % Apply to rash twice daily Patient not taking: No sig reported 02/23/20   Kandra Nicolas, MD  Vitamins A & D (VITAMIN A & D) ointment Apply 1 application topically 2 (two) times daily.    [provider]  White Petrolatum (WHITE PETROLEUM JELLY) GEL Apply topically. Patient not taking: No sig reported 04/07/20   [provider]    Allergies    Doxycycline and Tessalon [benzonatate]  Review of Systems   Review of Systems  Unable to perform ROS: Dementia   Physical Exam Updated Vital Signs BP 137/70   Pulse 75   Temp (!) 100.8 F (38.2 C) (Rectal)   Resp (!) 26   Ht '5\' 8"'$  (1.727 m)   Wt 60.3 kg   SpO2 92%   BMI 20.22 kg/m   Physical Exam Vitals and nursing note reviewed.  Constitutional:      Comments: Demented 87 year old male At his baseline mental status of alert and oriented x0.  Nonverbal.  Agitated and moving around in bed combatively when touched.  HENT:     Head: Normocephalic and atraumatic.      Nose: Nose normal.     Mouth/Throat:     Mouth: Mucous membranes are moist.  Eyes:     General: No scleral icterus. Cardiovascular:     Rate and Rhythm: Normal rate and regular rhythm.     Pulses: Normal pulses.     Heart sounds: Normal heart sounds.  Pulmonary:     Effort: Pulmonary effort is normal. No respiratory distress.     Breath sounds: Normal breath sounds. No wheezing.  Abdominal:     Palpations: Abdomen is soft.     Tenderness: There is no abdominal tenderness. There is no guarding or rebound.     Comments: Abdomen is soft.  Patient does not indicate any discomfort with palpation of the abdomen.  No guarding.  Musculoskeletal:     Cervical back: Normal range of motion.     Right lower leg: No edema.     Left lower leg: No edema.  Skin:    General: Skin is warm and dry.     Capillary Refill: Capillary refill takes less than 2 seconds.     Comments: Lower extremity bruises No tenderness to palpation injuries.  Neurological:     Mental Status: Mental status is at baseline.     Comments: Moves all 4 extremities.  Grimace symmetric.  Psychiatric:        Mood and Affect: Mood normal.        Behavior: Behavior normal.    ED Results / Procedures / Treatments   Labs (all labs ordered are listed, but only abnormal results are displayed) Labs Reviewed  CBC WITH DIFFERENTIAL/PLATELET - Abnormal; Notable for the following components:      Result Value   WBC 15.3 (*)    RBC 3.98 (*)    Neutro Abs 11.4 (*)    Monocytes Absolute 1.7 (*)    Abs Immature Granulocytes 0.28 (*)    All other components within normal limits  COMPREHENSIVE METABOLIC PANEL - Abnormal; Notable for the following components:  Glucose, Bld 119 (*)    BUN 25 (*)    Calcium 8.6 (*)    Total Protein 6.4 (*)    Albumin 2.9 (*)    All other components within normal limits  LACTIC ACID, PLASMA - Abnormal; Notable for the following components:   Lactic Acid, Venous 2.8 (*)    All other components  within normal limits  URINALYSIS, ROUTINE W REFLEX MICROSCOPIC - Abnormal; Notable for the following components:   APPearance HAZY (*)    Specific Gravity, Urine >1.030 (*)    Hgb urine dipstick LARGE (*)    Protein, ur 100 (*)    All other components within normal limits  BRAIN NATRIURETIC PEPTIDE - Abnormal; Notable for the following components:   B Natriuretic Peptide 140.7 (*)    All other components within normal limits  URINALYSIS, MICROSCOPIC (REFLEX) - Abnormal; Notable for the following components:   Bacteria, UA MANY (*)    All other components within normal limits  CBG MONITORING, ED - Abnormal; Notable for the following components:   Glucose-Capillary 128 (*)    All other components within normal limits  RESP PANEL BY RT-PCR (FLU A&B, COVID) [id number]  CULTURE, BLOOD (ROUTINE X 2)  CULTURE, BLOOD (ROUTINE X 2)  URINE CULTURE  LACTIC ACID, PLASMA  TROPONIN I (HIGH SENSITIVITY)    EKG EKG Interpretation  Date/Time:  Sunday January 11 2021 19:52:56 EDT Ventricular Rate:  91 PR Interval:  170 QRS Duration: 116 QT Interval:  360 QTC Calculation: 443 R Axis:   -72 Text Interpretation: Sinus rhythm Atrial premature complex Left anterior fascicular block ST elevation, consider inferior injury Baseline wander in lead(s) V2 No significant change since last tracing Confirmed by Wandra Arthurs [id number]) on 01/11/2021 7:58:11 PM  Radiology DG Chest Port 1 View  Result Date: 01/11/2021 CLINICAL DATA:  Shortness of breath and fevers EXAM: PORTABLE CHEST 1 VIEW COMPARISON:  11/27/2020 FINDINGS: Cardiac shadow is stable. Aortic calcifications are noted. The lungs are well aerated bilaterally. New basilar atelectasis is seen bilaterally left greater than right. No bony abnormality is noted. IMPRESSION: Mild bibasilar atelectasis. Electronically Signed   By: Inez Catalina M.D.   On: 01/11/2021 20:50    Procedures .Critical Care  Date/Time: 01/11/2021 11:43 PM Performed by: Tedd Sias, PA Authorized by: Tedd Sias, PA   Critical care provider statement:    Critical care time (minutes):  35   Critical care time was exclusive of:  Separately billable procedures and treating other patients and teaching time   Critical care was necessary to treat or prevent imminent or life-threatening deterioration of the following conditions:  Sepsis   Critical care was time spent personally by me on the following activities:  Discussions with consultants, evaluation of patient's response to treatment, examination of patient, review of old charts, re-evaluation of patient's condition, pulse oximetry, ordering and review of radiographic studies, ordering and review of laboratory studies and ordering and performing treatments and interventions   I assumed direction of critical care for this patient from another provider in my specialty: no     Medications Ordered in ED Medications  cefTRIAXone (ROCEPHIN) 1 g in sodium chloride 0.9 % 100 mL IVPB (1 g Intravenous New Bag/Given 01/11/21 2139)  0.9 %  sodium chloride infusion (has no administration in time range)  acetaminophen (TYLENOL) 160 MG/5ML solution 650 mg (650 mg Oral Given 01/11/21 2040)  LORazepam (ATIVAN) injection 1 mg (1 mg Intravenous Given 01/11/21 2019)  ED Course  I have reviewed the triage vital signs and the nursing notes.  Pertinent labs & imaging results that were available during my care of the patient were reviewed by me and considered in my medical decision making (see chart for details).  Clinical Course as of 01/11/21 2338  Nancy Fetter Jan 11, 2021  2205 [id number] carol hall MD will admit [WF]    Clinical Course User Index [WF] Tedd Sias, [place]   MDM Rules/Calculators/A&P                           Patient is a 87 year old male presented to the ER today with Foley that was placed Wednesday, 01/07/2021.  He is nonverbal at baseline advanced dementia.  Brought in by wife.  Patient is DNR/DNI.  Has  temperature of 100.8.  Also has leukocytosis, elevated lactate, and therefore meets sepsis criteria.  Has a Foley catheter chest x-ray is unremarkable.  Suspect the patient is septic from urinary source.  Has significant urine output with Foley placement 4 days ago approximately 2 L.  Troponin within normal limits.  BMP unremarkable.  No significant anemia.  Lactic improved with gentle hydration and antibiotics.  As needed order for Tylenol for fever.  Chest x-ray without infiltrate.  I discussed this case with my attending physician who cosigned this note including patient's presenting symptoms, physical exam, and planned diagnostics and interventions. Attending physician stated agreement with plan or made changes to plan which were implemented.   Attending physician assessed patient at bedside.   Will cover patient with Rocephin and admit to hospitalist.  Final Clinical Impression(s) / ED Diagnoses Final diagnoses:  Sepsis due to urinary tract infection [facility])    Rx / DC Orders ED Discharge Orders     None        Tedd Sias, [place] 01/11/21 2343    Drenda Freeze, MD 01/15/21 [phone]

## 2021-01-11 NOTE — ED Notes (Signed)
Patients' upper dentures removed placed in proper container with patient ID attached to container. Patient tolerated well.

## 2021-01-11 NOTE — ED Notes (Addendum)
Imaging could not be performed at this time, patient is combative.  RN advised we try again later.

## 2021-01-12 ENCOUNTER — Telehealth: Payer: Self-pay

## 2021-01-12 ENCOUNTER — Encounter (HOSPITAL_COMMUNITY): Payer: Self-pay | Admitting: Internal Medicine

## 2021-01-12 DIAGNOSIS — Z6820 Body mass index (BMI) 20.0-20.9, adult: Secondary | ICD-10-CM | POA: Diagnosis not present

## 2021-01-12 DIAGNOSIS — A4152 Sepsis due to Pseudomonas: Secondary | ICD-10-CM | POA: Diagnosis present

## 2021-01-12 DIAGNOSIS — Z8673 Personal history of transient ischemic attack (TIA), and cerebral infarction without residual deficits: Secondary | ICD-10-CM | POA: Diagnosis not present

## 2021-01-12 DIAGNOSIS — Z66 Do not resuscitate: Secondary | ICD-10-CM | POA: Diagnosis present

## 2021-01-12 DIAGNOSIS — N390 Urinary tract infection, site not specified: Secondary | ICD-10-CM | POA: Diagnosis present

## 2021-01-12 DIAGNOSIS — A419 Sepsis, unspecified organism: Secondary | ICD-10-CM | POA: Diagnosis not present

## 2021-01-12 DIAGNOSIS — Z888 Allergy status to other drugs, medicaments and biological substances status: Secondary | ICD-10-CM | POA: Diagnosis not present

## 2021-01-12 DIAGNOSIS — G4730 Sleep apnea, unspecified: Secondary | ICD-10-CM | POA: Diagnosis not present

## 2021-01-12 DIAGNOSIS — R627 Adult failure to thrive: Secondary | ICD-10-CM | POA: Diagnosis present

## 2021-01-12 DIAGNOSIS — G301 Alzheimer's disease with late onset: Secondary | ICD-10-CM | POA: Diagnosis present

## 2021-01-12 DIAGNOSIS — Z803 Family history of malignant neoplasm of breast: Secondary | ICD-10-CM | POA: Diagnosis not present

## 2021-01-12 DIAGNOSIS — Z881 Allergy status to other antibiotic agents status: Secondary | ICD-10-CM | POA: Diagnosis not present

## 2021-01-12 DIAGNOSIS — Z8616 Personal history of COVID-19: Secondary | ICD-10-CM | POA: Diagnosis not present

## 2021-01-12 DIAGNOSIS — R338 Other retention of urine: Secondary | ICD-10-CM | POA: Diagnosis present

## 2021-01-12 DIAGNOSIS — F0281 Dementia in other diseases classified elsewhere with behavioral disturbance: Secondary | ICD-10-CM | POA: Diagnosis present

## 2021-01-12 DIAGNOSIS — Z87891 Personal history of nicotine dependence: Secondary | ICD-10-CM | POA: Diagnosis not present

## 2021-01-12 DIAGNOSIS — T83511A Infection and inflammatory reaction due to indwelling urethral catheter, initial encounter: Secondary | ICD-10-CM | POA: Diagnosis present

## 2021-01-12 DIAGNOSIS — J9601 Acute respiratory failure with hypoxia: Secondary | ICD-10-CM | POA: Diagnosis present

## 2021-01-12 DIAGNOSIS — Z79899 Other long term (current) drug therapy: Secondary | ICD-10-CM | POA: Diagnosis not present

## 2021-01-12 DIAGNOSIS — J9811 Atelectasis: Secondary | ICD-10-CM | POA: Diagnosis present

## 2021-01-12 DIAGNOSIS — Z7982 Long term (current) use of aspirin: Secondary | ICD-10-CM | POA: Diagnosis not present

## 2021-01-12 DIAGNOSIS — Y846 Urinary catheterization as the cause of abnormal reaction of the patient, or of later complication, without mention of misadventure at the time of the procedure: Secondary | ICD-10-CM | POA: Diagnosis present

## 2021-01-12 DIAGNOSIS — N401 Enlarged prostate with lower urinary tract symptoms: Secondary | ICD-10-CM | POA: Diagnosis present

## 2021-01-12 DIAGNOSIS — E43 Unspecified severe protein-calorie malnutrition: Secondary | ICD-10-CM | POA: Diagnosis present

## 2021-01-12 DIAGNOSIS — F0280 Dementia in other diseases classified elsewhere without behavioral disturbance: Secondary | ICD-10-CM | POA: Diagnosis not present

## 2021-01-12 LAB — CBC
HCT: 43.2 % (ref 39.0–52.0)
Hemoglobin: 13.8 g/dL (ref 13.0–17.0)
MCH: 32.5 pg (ref 26.0–34.0)
MCHC: 31.9 g/dL (ref 30.0–36.0)
MCV: 101.9 fL — ABNORMAL HIGH (ref 80.0–100.0)
Platelets: 351 10*3/uL (ref 150–400)
RBC: 4.24 MIL/uL (ref 4.22–5.81)
RDW: 13.3 % (ref 11.5–15.5)
WBC: 11.2 10*3/uL — ABNORMAL HIGH (ref 4.0–10.5)
nRBC: 0 % (ref 0.0–0.2)

## 2021-01-12 LAB — BASIC METABOLIC PANEL
Anion gap: 7 (ref 5–15)
BUN: 17 mg/dL (ref 8–23)
CO2: 25 mmol/L (ref 22–32)
Calcium: 8.4 mg/dL — ABNORMAL LOW (ref 8.9–10.3)
Chloride: 104 mmol/L (ref 98–111)
Creatinine, Ser: 0.73 mg/dL (ref 0.61–1.24)
GFR, Estimated: >60 mL/min (ref >60)
Glucose, Bld: 123 mg/dL — ABNORMAL HIGH (ref 70–99)
Potassium: 4.2 mmol/L (ref 3.5–5.1)
Sodium: 136 mmol/L (ref 135–145)

## 2021-01-12 MED ORDER — ASPIRIN EC 81 MG PO TBEC
162.0000 mg | DELAYED_RELEASE_TABLET | Freq: Every day | ORAL | Status: DC
  Administered 2021-01-13 – 2021-01-17 (×5): 162 mg via ORAL
  Filled 2021-01-12 (×6): qty 2

## 2021-01-12 MED ORDER — SODIUM CHLORIDE 0.9 % IV SOLN
2.0000 g | Freq: Once | INTRAVENOUS | Status: AC
  Administered 2021-01-12: 2 g via INTRAVENOUS
  Filled 2021-01-12: qty 2

## 2021-01-12 MED ORDER — ACETAMINOPHEN 325 MG PO TABS: 650.0000 mg | ORAL_TABLET | Freq: Four times a day (QID) | ORAL | Status: DC | PRN

## 2021-01-12 MED ORDER — ACETAMINOPHEN 650 MG RE SUPP: 650.0000 mg | Freq: Four times a day (QID) | RECTAL | Status: DC | PRN

## 2021-01-12 MED ORDER — ONDANSETRON HCL 4 MG/2ML IJ SOLN: 4.0000 mg | Freq: Four times a day (QID) | INTRAMUSCULAR | Status: DC | PRN

## 2021-01-12 MED ORDER — SODIUM CHLORIDE 0.9 % IV SOLN
1.0000 g | INTRAVENOUS | Status: DC
  Filled 2021-01-12: qty 10

## 2021-01-12 MED ORDER — TAMSULOSIN HCL 0.4 MG PO CAPS
0.4000 mg | ORAL_CAPSULE | Freq: Every day | ORAL | Status: DC
  Administered 2021-01-13 – 2021-01-16 (×4): 0.4 mg via ORAL
  Filled 2021-01-12 (×4): qty 1

## 2021-01-12 MED ORDER — LACTATED RINGERS IV BOLUS (SEPSIS)
1000.0000 mL | Freq: Once | INTRAVENOUS | Status: AC
  Administered 2021-01-12: 1000 mL via INTRAVENOUS

## 2021-01-12 MED ORDER — POLYETHYLENE GLYCOL 3350 17 G PO PACK
17.0000 g | PACK | Freq: Every day | ORAL | Status: DC | PRN
  Administered 2021-01-15 – 2021-01-16 (×2): 17 g via ORAL
  Filled 2021-01-12 (×2): qty 1

## 2021-01-12 MED ORDER — HYDROCODONE-ACETAMINOPHEN 5-325 MG PO TABS
1.0000 | ORAL_TABLET | ORAL | Status: DC | PRN
Start: 2021-01-12 — End: 2021-01-18

## 2021-01-12 MED ORDER — ENSURE ENLIVE PO LIQD
237.0000 mL | Freq: Two times a day (BID) | ORAL | Status: DC
  Administered 2021-01-13 – 2021-01-17 (×9): 237 mL via ORAL

## 2021-01-12 MED ORDER — SODIUM CHLORIDE 0.9% FLUSH
3.0000 mL | Freq: Two times a day (BID) | INTRAVENOUS | Status: DC
  Administered 2021-01-12 – 2021-01-16 (×6): 3 mL via INTRAVENOUS

## 2021-01-12 MED ORDER — ONDANSETRON HCL 4 MG PO TABS: 4.0000 mg | ORAL_TABLET | Freq: Four times a day (QID) | ORAL | Status: DC | PRN

## 2021-01-12 MED ORDER — ENOXAPARIN SODIUM 40 MG/0.4ML IJ SOSY
40.0000 mg | PREFILLED_SYRINGE | INTRAMUSCULAR | Status: DC
  Administered 2021-01-12 – 2021-01-16 (×5): 40 mg via SUBCUTANEOUS
  Filled 2021-01-12 (×5): qty 0.4

## 2021-01-12 MED ORDER — SODIUM CHLORIDE 0.9 % IV SOLN
2.0000 g | Freq: Two times a day (BID) | INTRAVENOUS | Status: DC
  Administered 2021-01-13 – 2021-01-14 (×3): 2 g via INTRAVENOUS
  Filled 2021-01-12 (×3): qty 2

## 2021-01-12 MED ORDER — CHLORHEXIDINE GLUCONATE CLOTH 2 % EX PADS
6.0000 | MEDICATED_PAD | Freq: Every day | CUTANEOUS | Status: DC
  Administered 2021-01-12 – 2021-01-17 (×6): 6 via TOPICAL

## 2021-01-12 NOTE — ED Notes (Signed)
Leg bag was replaced with a foley drainage bag.

## 2021-01-12 NOTE — Telephone Encounter (Signed)
FYI, pt currently at [facility]. Please see below.  [facility] - Client TELEPHONE ADVICE RECORD [facility] Patient Name: Mitchell Abbott Gender: Male DOB: 1923/08/26 Age: 87 Y 66 M 14 D Return Phone Number: [phone] (Primary) Address:City/State/Zip: [place] [address] Client [facility] - Client Client Site [facility] - Day Physician Mitchell Abbott - MD Contact Type Call Who Is Calling Patient / Member / Family / Caregiver Call Type Triage / Clinical Caller Name Mitchell Abbott Relationship To Patient Spouse Return Phone Number [phone] (Primary) Chief Complaint Weakness, Generalized Reason for Call Symptomatic / Request for [name] states that her husband has a fever of 100.2, caller is on antibiotics. Caller states that he is pretty weak/frail. Translation No Nurse Assessment Nurse: Mitchell Goo, RN, Mitchell Abbott Date/Abbott ([place] Abbott): 01/11/2021 5:10:06 PM Confirm and document reason for call. If symptomatic, describe symptoms. ---Caller states husband is on abx for an oral ulcer and is still running a fever. He has been taking amoxicillin twice a day since Monday night, rx ends tomorrow. Temp currently 98.6 (temporal). Caller reports he is weak as well which started last week. Does the patient have any new or worsening symptoms? ---Yes Will a triage be completed? ---Yes Related visit to physician within the last 2 weeks? ---Yes Does the PT have any chronic conditions? (i.e. diabetes, asthma, this includes High risk factors for pregnancy, etc.) ---Yes List chronic conditions. ---alzheimers, enlarged prostate Is this a behavioral health or substance abuse call? ---No Guidelines Guideline Title Affirmed Question Affirmed Notes Nurse Date/Abbott ([place] Abbott) Weakness (Generalized) and Fatigue [1] MODERATE weakness (i.e., interferes with work, school,  normal activities) AND [2] cause unknown (Exceptions: Quandt, RN, Mitchell Abbott 01/11/2021 5:15:31 PM PLEASE NOTE: All timestamps contained within this report are represented as [place] Standard Abbott. CONFIDENTIALTY NOTICE: This fax transmission is intended only for the addressee. It contains information that is legally privileged, confidential or otherwise protected from use or disclosure. If you are not the intended recipient, you are strictly prohibited from reviewing, disclosing, copying using or disseminating any of this information or taking any action in reliance on or regarding this information. If you have received this fax in error, please notify us immediately by telephone so that we can arrange for its return to [place]. Phone: [phone], Toll-Free: [phone], Fax: [phone] Page: 2 of 2 Call Id: Mitchell Abbott Guidelines Guideline Title Affirmed Question Affirmed Notes Nurse Date/Abbott Mitchell Abbott) weakness with acute minor illness, or weakness from poor fluid intake) Disp. Abbott Mitchell Abbott) Disposition Final User 01/11/2021 5:29:20 PM See HCP within 4 Hours (or PCP triage) Yes Quandt, RN, Mitchell Abbott Disagree/Comply Comply Caller Understands Yes PreDisposition Go to ED Care Advice Given Per Guideline SEE HCP (OR PCP TRIAGE) WITHIN 4 HOURS: CALL BACK IF: * You become worse CARE ADVICE given per Weakness and Fatigue (Adult) guideline. * ED: Patients who may need surgery or hospital admission need to be sent to an ED. So do most patients with serious symptoms or complex medical problems. * IF OFFICE WILL BE CLOSED AND NO PCP (PRIMARY CARE PROVIDER) SECOND-LEVEL TRIAGE: You need to be seen within the next 3 or 4 hours. A nearby Urgent Care Center [facility]) is often a good source of care. Another choice is to go to the ED. Go sooner if you become worse. Comments User: Mitchell Crumble, RN Date/Abbott Mitchell Abbott): 01/11/2021 5:26:19 PM no bm for 3 days User: Mitchell Crumble, RN Date/Abbott  (  [place] Abbott): 01/11/2021 5:30:44 PM caller states husband is in wheel chair most of the Abbott and is having to take more frequent rest periods than usual after walking 20-30 feet. Referrals GO TO FACILITY UNDECIDED

## 2021-01-12 NOTE — ED Notes (Signed)
Wife is back and [facility] is here to take pt to [facility]

## 2021-01-12 NOTE — ED Notes (Signed)
Signature pad not working. 

## 2021-01-12 NOTE — ED Notes (Addendum)
Pt given apple juice and mac and cheese, fed by wife , pt pulled up in bed , face washed , ptwill moan and make grunting noises but does not speak words and will not follow commands

## 2021-01-12 NOTE — ED Notes (Signed)
pts wife remains at bedside , pt fed by wife

## 2021-01-12 NOTE — ED Notes (Signed)
Patient is resting comfortably. 

## 2021-01-12 NOTE — Progress Notes (Signed)
PHARMACY NOTE -  Cefepime  Pharmacy has been assisting with dosing of cefepime for urosepsis. Dosage remains stable at 2g IV q12 hr and further renal adjustments per institutional Pharmacy antibiotic protocol  Pharmacy will sign off, following peripherally for culture results or dose adjustments. Please reconsult if a change in clinical status warrants re-evaluation of dosage.  Reuel Boom, PharmD, BCPS [phone] 01/12/2021, 5:14 PM

## 2021-01-12 NOTE — ED Notes (Signed)
Wife went home to get some sleep

## 2021-01-12 NOTE — Telephone Encounter (Signed)
Noted  

## 2021-01-12 NOTE — Progress Notes (Signed)
Pt with poor IV veins and IV access. Admitted from [facility] earlier. Left NSL was bleeding on arrival. Second IV site to Right arm is not able to tolerate LR per sepsis protocol rate. LR bolus started at 250 and will monitor site

## 2021-01-12 NOTE — H&P (Addendum)
History and Physical    RALLY OUCH ACZ:[id number] DOB: 06/19/1923 DOA: 01/11/2021  PCP: Tammi Sou, MD  Patient coming from: Home  Chief Complaint: Fevers, confusion  HPI: Mitchell FREILICH is a 87 y.o. male with medical history significant of advanced Alzheimer's dementia with behavioral disturbance, history of TIA, BPH, COVID in January 2022 who presented to [facility] on 8/14 with complaints of intermittent elevated temperature but no real fevers at home.  Patient is nonverbal and wife provides history.  Patient was seen 1 week ago and treated with Augmentin on 8/8 for aphthous ulcers with concern for infection in his mouth, has had intermittent fevers for about a week at home, seem to be more agitated.  He was seen by urology on Wednesday and had a Foley catheter replaced 8/10, had 1979m UOP at that time.  Per wife, patient has been weaker than his baseline.  Patient lives with his wife at home at baseline, has caregiver come to help him couple hours every day.  He is dependent on all activities of daily living.  ED Course: Patient with sepsis criteria with fever, leukocytosis.  Sepsis thought to be secondary to urinary source.  [facility]  Review of Systems: As per HPI. Otherwise, all other review of systems reviewed and are negative.   Past Medical History:  Diagnosis Date   Actinic keratoses    Adrenal hemorrhage [place]   s/p fall from ladder--a f/u CT abd showed resolution of the hemorrhage- (duodenal polyp incidentally noted;  f/u EGD by Dr. MWatt Climes11/2001 showed small lipomatous mass in duodenum which was confirmed by biopsy.   Alzheimer's dementia ([place]    with behav disturbance and apathy.  [facility] as [facility].  They added namenda 07/2014 due to pt's signif decline in the prior 6 mo.   Arthralgia of knee, right 2009   exam suspicious for meniscal origin    BPH (benign prostatic hypertrophy)    History of acute urinary retention (2006)  Hx of elevated PSA (7.26) + right sided prostate nodule on DRE by urologist.  PSA did not go down with tx with antibiotics--biopsy was done and showed NO MALIGNANCY but some chronic inflammation was present--turned out related to acute cystitis (enterococcus UTI).  PSA was 1.17 in 07/2011 at urologist's (Dr. OKarsten Ro.  F/u at urol is now prn as of 08/2014.   BPPV (benign paroxysmal positional vertigo)    COVID-19 virus infection 06/09/2020   DDD (degenerative disc disease), cervical    C5-6, C6-7 primarily   Headache in back of head    Neurologist treated him with PT for this problem and it helped   Hearing deficit    Bilateral; noise damage in [employer]  Has had hearing aids since about 2008.   Herpes zoster 07/25/2018   suspected--valtrex rx'd.   History of acute cystitis 08/2009   with acute prostatitis (enterococcus faecalis)   History of solitary pulmonary nodule    Initially noted on CT abd for his adrenal hemorrhage s/p fall from ladder 2001.  F/u CT chest 04/2000 showed that it had shrunk from 164mto 8m37mnd had no malignant features.   History of TIA (transient ischemic attack) @ 2008   02/2001 and 01/2006 carotid dopplers showed 50% or less stenosis.   Left bundle branch hemiblock    mentioned in dx of old records 04/15/2009   Migraine headache with aura  onset in teens   No migraines since about 2004   Poor dentition    As of 07/2019 being referred to oral surgeon for removal of all implants and all remaining teeth   RSV bronchitis 01/2020   Skin cancer     Past Surgical History:  Procedure Laterality Date   Carotid dopplers  05/2016   1-39% stenosis bilat ICA.   cataract extrac     dental     implants   PROSTATE BIOPSY  04/07/2005   For elevated PSA + right sided prostate nodule on DRE by urologist:  benign biopsy.   TONSILLECTOMY  preteen   TRANSTHORACIC ECHOCARDIOGRAM  06/28/2016   EF  60-65%, mild dilatation of ascending aorta.     reports that he has quit smoking. His smoking use included cigarettes. He has never used smokeless tobacco. He reports current alcohol use of about 1.0 standard drink per week. He reports that he does not use drugs.  Allergies  Allergen Reactions   Doxycycline Swelling    Lower lip swelling   Tessalon [Benzonatate] Swelling    Lower lip swelling    Family History  Problem Relation Age of Onset   Sudden death Mother 77       FH info obtained from Dr. Ovid Curd records.   Breast cancer Daughter    Migraines Daughter     Prior to Admission medications   Medication Sig Start Date End Date Taking? Authorizing Provider  amoxicillin-clavulanate (AUGMENTIN) 400-57 MG/5ML suspension Take 10 mLs (800 mg total) by mouth 2 (two) times daily. 01/05/21   Drenda Freeze, MD  aspirin 325 MG EC tablet Take 1 tablet (325 mg total) by mouth daily. Patient taking differently: Take 325 mg by mouth daily. Pt takes 1/2 tab of 375m. 10/20/12   YMarcial Pacas MD  fluticasone (CUTIVATE) 0.05 % cream Apply topically 2 (two) times daily. Apply to upper back rash Patient not taking: No sig reported 01/28/20   McGowen, PAdrian Blackwater MD  magic mouthwash (nystatin, lidocaine, diphenhydrAMINE, alum & mag hydroxide) suspension Swish and spit 5 mLs 3 (three) times daily as needed for mouth pain. Patient not taking: Reported on 01/07/2021 01/05/21   YDrenda Freeze MD  mupirocin ointment (Drue Stager 2 %  07/28/18   [provider]  risperiDONE (RISPERDAL) 0.5 MG tablet Take 0.125-0.25 mg by mouth See admin instructions. 1/4 tablet in am and 1/2 tablet pm Patient not taking: No sig reported    [provider]  rivastigmine (EXELON) 13.3 MG/24HR Place 13.3 mg onto the skin daily. Patient not taking: No sig reported    [provider]  tamsulosin (FLOMAX) 0.4 MG CAPS capsule Take 2 capsules (0.8 mg total) by mouth daily after supper. Patient taking  differently: Take 0.4 mg by mouth daily after supper. 08/11/16   McGowen, PAdrian Blackwater MD  terbinafine (LAMISIL) 1 % cream  12/21/18   [provider]  triamcinolone cream (KENALOG) 0.1 % Apply to rash twice daily Patient not taking: No sig reported 02/23/20   BKandra Nicolas MD  Vitamins A & D (VITAMIN A & D) ointment Apply 1 application topically 2 (two) times daily.    [provider]  White Petrolatum (WHITE PETROLEUM JELLY) GEL Apply topically. Patient not taking: No sig reported 04/07/20   [provider]    Physical Exam: Vitals:   01/12/21 1415 01/12/21 1430 01/12/21 1445 01/12/21 1634  BP: (!) 153/77 (!) 154/85 (!) 148/94 (!) 160/72  Pulse: 87 81 83  98  Resp: (!) 21 (!) 30 15 (!) 22  Temp:    98.2 F (36.8 C)  TempSrc:    Axillary  SpO2: 100% 95% 97% (!) 78%  Weight:    60.3 kg  Height:    5' 7"  (1.702 m)    Constitutional: Appears uncomfortable but not in distress, moaning Eyes: PERRL, lids and conjunctivae normal Respiratory: Diminished breath sounds without increase in respiratory effort, on nasal cannula O2 Cardiovascular: Tachycardic, regular rhythm, rate 100s Abdomen: Soft, nondistended, nontender to palpation.  Musculoskeletal: No joint deformity upper and lower extremities. No contractures. Normal muscle tone.  Skin: no rashes, lesions, ulcers on exposed skin  Neurologic: Alert but nonverbal, does not interact Psychiatric: Advanced dementia  Labs on Admission: I have personally reviewed following labs and imaging studies  CBC: Recent Labs  Lab 01/11/21 1940  WBC 15.3*  NEUTROABS 11.4*  HGB 13.2  HCT 39.5  MCV 99.2  PLT 341   Basic Metabolic Panel: Recent Labs  Lab 01/11/21 1940  NA 135  K 4.8  CL 101  CO2 25  GLUCOSE 119*  BUN 25*  CREATININE 1.07  CALCIUM 8.6*   GFR: Estimated Creatinine Clearance: 36 mL/min (by C-G formula based on SCr of 1.07 mg/dL). Liver Function Tests: Recent Labs  Lab 01/11/21 1940  AST 35   ALT 30  ALKPHOS 74  BILITOT 0.4  PROT 6.4*  ALBUMIN 2.9*   No results for input(s): LIPASE, AMYLASE in the last 168 hours. No results for input(s): AMMONIA in the last 168 hours. Coagulation Profile: No results for input(s): INR, PROTIME in the last 168 hours. Cardiac Enzymes: No results for input(s): CKTOTAL, CKMB, CKMBINDEX, TROPONINI in the last 168 hours. BNP (last 3 results) No results for input(s): PROBNP in the last 8760 hours. HbA1C: No results for input(s): HGBA1C in the last 72 hours. CBG: Recent Labs  Lab 01/11/21 1950  GLUCAP 128*   Lipid Profile: No results for input(s): CHOL, HDL, LDLCALC, TRIG, CHOLHDL, LDLDIRECT in the last 72 hours. Thyroid Function Tests: No results for input(s): TSH, T4TOTAL, FREET4, T3FREE, THYROIDAB in the last 72 hours. Anemia Panel: No results for input(s): VITAMINB12, FOLATE, FERRITIN, TIBC, IRON, RETICCTPCT in the last 72 hours. Urine analysis:    Component Value Date/Time   COLORURINE YELLOW 01/11/2021 2002   APPEARANCEUR HAZY (A) 01/11/2021 2002   LABSPEC >1.030 (H) 01/11/2021 2002   PHURINE 5.5 01/11/2021 2002   GLUCOSEU NEGATIVE 01/11/2021 2002   GLUCOSEU NEGATIVE 01/05/2012 0914   HGBUR LARGE (A) 01/11/2021 2002   BILIRUBINUR NEGATIVE 01/11/2021 2002   BILIRUBINUR negative 01/17/2017 [place] 01/11/2021 2002   PROTEINUR 100 (A) 01/11/2021 2002   UROBILINOGEN 0.2 01/17/2017 1206   UROBILINOGEN 0.2 01/05/2012 0914   NITRITE NEGATIVE 01/11/2021 2002   LEUKOCYTESUR NEGATIVE 01/11/2021 2002   Sepsis Labs: !!!!!!!!!!!!!!!!!!!!!!!!!!!!!!!!!!!!!!!!!!!! @LABRCNTIP (procalcitonin:4,lacticidven:4) ) Recent Results (from the past 240 hour(s))  Blood culture (routine x 2)     Status: None (Preliminary result)   Collection Time: 01/11/21  7:40 PM   Specimen: BLOOD LEFT FOREARM  Result Value Ref Range Status   Specimen Description   Final    BLOOD LEFT FOREARM BLOOD Performed at [facility], [address] [place], [place] [address]    Special Requests   Final    BOTTLES DRAWN AEROBIC AND ANAEROBIC Blood Culture adequate volume Performed at [facility], [address] [place], [place] [address]    Culture  Final    NO GROWTH < 12 HOURS Performed at [facility] [address], [place], [place] [address]    Report Status PENDING  Incomplete  Resp Panel by RT-PCR (Flu A&B, Covid) Nasopharyngeal Swab     Status: None   Collection Time: 01/11/21  8:28 PM   Specimen: Nasopharyngeal Swab; Nasopharyngeal(NP) swabs in vial transport medium  Result Value Ref Range Status   SARS Coronavirus 2 by RT PCR NEGATIVE NEGATIVE Final    Comment: (NOTE) SARS-CoV-2 target nucleic acids are NOT DETECTED.  The SARS-CoV-2 RNA is generally detectable in upper respiratory specimens during the acute phase of infection. The lowest concentration of SARS-CoV-2 viral copies this assay can detect is 138 copies/mL. A negative result does not preclude SARS-Cov-2 infection and should not be used as the sole basis for treatment or other patient management decisions. A negative result may occur with  improper specimen collection/handling, submission of specimen other than nasopharyngeal swab, presence of viral mutation(s) within the areas targeted by this assay, and inadequate number of viral copies(<138 copies/mL). A negative result must be combined with clinical observations, patient history, and epidemiological information. The expected result is Negative.  Fact Sheet for Patients:  EntrepreneurPulse.com.au  Fact Sheet for Healthcare Providers:  IncredibleEmployment.be  This test is no t yet approved or cleared by the [place] FDA and  has been authorized for detection and/or diagnosis of SARS-CoV-2 by FDA under an Emergency Use Authorization (EUA). This EUA will remain  in effect (meaning this test can be used) for the duration of  the COVID-19 declaration under Section 564(b)(1) of the Act, 21 U.S.C.section 360bbb-3(b)(1), unless the authorization is terminated  or revoked sooner.       Influenza A by PCR NEGATIVE NEGATIVE Final   Influenza B by PCR NEGATIVE NEGATIVE Final    Comment: (NOTE) The Xpert Xpress SARS-CoV-2/FLU/RSV plus assay is intended as an aid in the diagnosis of influenza from Nasopharyngeal swab specimens and should not be used as a sole basis for treatment. Nasal washings and aspirates are unacceptable for Xpert Xpress SARS-CoV-2/FLU/RSV testing.  Fact Sheet for Patients: EntrepreneurPulse.com.au  Fact Sheet for Healthcare Providers: IncredibleEmployment.be  This test is not yet approved or cleared by the [place] FDA and has been authorized for detection and/or diagnosis of SARS-CoV-2 by FDA under an Emergency Use Authorization (EUA). This EUA will remain in effect (meaning this test can be used) for the duration of the COVID-19 declaration under Section 564(b)(1) of the Act, 21 U.S.C. section 360bbb-3(b)(1), unless the authorization is terminated or revoked.  Performed at [facility], [address] [place], [place] [address]   Blood culture (routine x 2)     Status: None (Preliminary result)   Collection Time: 01/11/21  8:36 PM   Specimen: BLOOD RIGHT FOREARM  Result Value Ref Range Status   Specimen Description   Final    BLOOD RIGHT FOREARM BLOOD Performed at [facility], [address] [place], [place] [address]    Special Requests   Final    BOTTLES DRAWN AEROBIC AND ANAEROBIC Blood Culture adequate volume Performed at [facility], [address] [place], [place] [address]    Culture   Final    NO GROWTH < 12 HOURS Performed at [facility], [place] [address] [place], [place] [address]    Report Status PENDING  Incomplete     Radiological Exams on Admission: DG Chest [facility]  1  View  Result Date: 01/11/2021 CLINICAL DATA:  Shortness of breath and fevers EXAM: PORTABLE CHEST 1 VIEW COMPARISON:  11/27/2020 FINDINGS: Cardiac shadow is stable. Aortic calcifications are noted. The lungs are well aerated bilaterally. New basilar atelectasis is seen bilaterally left greater than right. No bony abnormality is noted. IMPRESSION: Mild bibasilar atelectasis. Electronically Signed   By: Inez Catalina M.D.   On: 01/11/2021 20:50    EKG: Independently reviewed.  Sinus rhythm with left axis deviation  Assessment/Plan Principal Problem:   Sepsis secondary to UTI [facility]) Active Problems:   Benign prostatic hyperplasia   History of TIA (transient ischemic attack)   Alzheimer's disease with late onset ([place])   Sepsis secondary to CAUTI, sepsis present on admission -Patient met sepsis criteria with fever 100.8, leukocytosis 15.3 -Foley catheter placed by urology as outpatient 8/10 -UA reveals many bacteria -Blood culture and urine culture pending -Cefepime -IV fluid bolus ordered using sepsis order set bundle  History of TIA -Continue aspirin  Advanced Alzheimer's dementia -Nonverbal at baseline   DVT prophylaxis: Lovenox enoxaparin (LOVENOX) injection 40 mg Start: 01/12/21 1800 Code Status: DNR, confirmed with wife  Family Communication: Wife over the phone  Disposition Plan: Pending improvement, likely to return to home setting  Consults called: None   Status is: Inpatient  Remains inpatient appropriate because:Inpatient level of care appropriate due to severity of illness  Dispo: The patient is from: Home              Anticipated d/c is to: Home              Patient currently is not medically stable to d/c.   Difficult to place patient No       Severity of Illness: The appropriate patient status for this patient is INPATIENT. Inpatient status is judged to be reasonable and necessary in order to provide the required intensity of service to ensure the  patient's safety. The patient's presenting symptoms, physical exam findings, and initial radiographic and laboratory data in the context of their chronic comorbidities is felt to place them at high risk for further clinical deterioration. Furthermore, it is not anticipated that the patient will be medically stable for discharge from the hospital within 2 midnights of admission.  * I certify that at the point of admission it is my clinical judgment that the patient will require inpatient hospital care spanning beyond 2 midnights from the point of admission due to high intensity of service, high risk for further deterioration and high frequency of surveillance required.Dessa Phi, DO [facility] 01/12/2021, 5:18 PM   Available via Epic secure chat 7am-7pm After these hours, please refer to coverage provider listed on [email or web address]

## 2021-01-13 DIAGNOSIS — E43 Unspecified severe protein-calorie malnutrition: Secondary | ICD-10-CM | POA: Insufficient documentation

## 2021-01-13 LAB — BLOOD CULTURE ID PANEL (REFLEXED) - BCID2

## 2021-01-13 LAB — BASIC METABOLIC PANEL
Anion gap: 8 (ref 5–15)
BUN: 17 mg/dL (ref 8–23)
CO2: 24 mmol/L (ref 22–32)
Calcium: 8.3 mg/dL — ABNORMAL LOW (ref 8.9–10.3)
Chloride: 106 mmol/L (ref 98–111)
Creatinine, Ser: 0.78 mg/dL (ref 0.61–1.24)
GFR, Estimated: >60 mL/min (ref >60)
Glucose, Bld: 106 mg/dL — ABNORMAL HIGH (ref 70–99)
Potassium: 4.8 mmol/L (ref 3.5–5.1)
Sodium: 138 mmol/L (ref 135–145)

## 2021-01-13 LAB — CBC
HCT: 38 % — ABNORMAL LOW (ref 39.0–52.0)
Hemoglobin: 12.3 g/dL — ABNORMAL LOW (ref 13.0–17.0)
MCH: 32.9 pg (ref 26.0–34.0)
MCHC: 32.4 g/dL (ref 30.0–36.0)
MCV: 101.6 fL — ABNORMAL HIGH (ref 80.0–100.0)
Platelets: 330 10*3/uL (ref 150–400)
RBC: 3.74 MIL/uL — ABNORMAL LOW (ref 4.22–5.81)
RDW: 13.2 % (ref 11.5–15.5)
WBC: 13 10*3/uL — ABNORMAL HIGH (ref 4.0–10.5)
nRBC: 0 % (ref 0.0–0.2)

## 2021-01-13 MED ORDER — ADULT MULTIVITAMIN W/MINERALS CH
1.0000 | ORAL_TABLET | Freq: Every day | ORAL | Status: DC
  Administered 2021-01-13 – 2021-01-17 (×5): 1 via ORAL
  Filled 2021-01-13 (×5): qty 1

## 2021-01-13 NOTE — Plan of Care (Signed)
  Problem: Clinical Measurements: Goal: Respiratory complications will improve Outcome: Progressing Goal: Cardiovascular complication will be avoided Outcome: Progressing   Problem: Nutrition: Goal: Adequate nutrition will be maintained Outcome: Progressing   

## 2021-01-13 NOTE — Progress Notes (Signed)
Initial Nutrition Assessment  DOCUMENTATION CODES:   Severe malnutrition in context of chronic illness  INTERVENTION:  - will order Hormel Shake BID, each supplement provides 500 kcal and 22 grams protein.  - will order Magic Cup BID with meals, each supplement provides 290 kcal and 9 grams of protein. - will order 1 tablet multivitamin with minerals/day.    NUTRITION DIAGNOSIS:   Severe Malnutrition related to chronic illness (dementia) as evidenced by severe fat depletion, severe muscle depletion.  GOAL:   Patient will meet greater than or equal to 90% of their needs  MONITOR:   PO intake, Supplement acceptance, Labs, Weight trends  REASON FOR ASSESSMENT:   Malnutrition Screening Tool  ASSESSMENT:   87 y.o. male with medical history of advanced Alzheimer's dementia with behavioral disturbance, TIA, BPH, COVID in 05/2020. He presented to [facility] ED at [facility] on 8/14 with intermittent fevers, increased agitation, and increased weakness. He is non-verbal at baseline and his wife and caregiver provide care for him in the home.  Patient seen late AM today. No family or visitors present at that time. SLP was about to work with patient at that time. See SLP note from 1152.  Patient is now ordered Dysphagia 3, nectar-thick liquids. Will order ONS as outlined above as both are at least nectar-thick consistency.   Weight yesterday was 133 lb and weight on 12/18/20 was 139 lb. This indicates 6 lb weight loss (4.3% body weight) in the past 3 weeks.   Per notes: - sepsis 2/2 CAUTI - acute hypoxemic respiratory failure - dysphagia at baseline - from home with likely plan to return home at the time of d/c   Labs reviewed; Ca: 8.3 mg/dl. Medications reviewed.    NUTRITION - FOCUSED PHYSICAL EXAM:  Flowsheet Row Most Recent Value  Orbital Region Moderate depletion  Upper Arm Region Severe depletion  Thoracic and Lumbar Region Unable to assess  Buccal Region Severe  depletion  Temple Region Moderate depletion  Clavicle Bone Region Severe depletion  Clavicle and Acromion Bone Region Severe depletion  Scapular Bone Region Severe depletion  Dorsal Hand Moderate depletion  Patellar Region Severe depletion  Anterior Thigh Region Severe depletion  Posterior Calf Region Moderate depletion  Edema (RD Assessment) None  Hair Reviewed  Eyes Reviewed  Mouth Unable to assess  Skin Reviewed  Nails Reviewed       Diet Order:   Diet Order             DIET DYS 3 Room service appropriate? No; Fluid consistency: Nectar Thick  Diet effective now                   EDUCATION NEEDS:   No education needs have been identified at this time  Skin:  Skin Assessment: Reviewed RN Assessment  Last BM:  8/14  Height:   Ht Readings from Last 1 Encounters:  01/12/21 '5\' 7"'$  (1.702 m)    Weight:   Wt Readings from Last 1 Encounters:  01/12/21 60.3 kg     Estimated Nutritional Needs:  Kcal:  1650-1850 kcal Protein:  80-90 grams Fluid:  >/= 2 L/day     Jarome Matin, MS, RD, LDN, CNSC Inpatient Clinical Dietitian RD pager # available in AMION  After hours/weekend pager # available in [facility]

## 2021-01-13 NOTE — Progress Notes (Signed)
Clinical swallow evaluation completed. SLP attempted to call wife at number on the board but it went to voice mail.  Pt only tested with ice cream, nectar juice via tsp, cup, straw and graham crackers.  He was sleepy but participative and did not vocalize *except to appear to sing x1 briefly*.  Prolonged oral holding clinically present with pt masticating all boluses.  Delayed swallow noted up to 12 seconds with liquids and 32 seconds with small moist solid, even with pt assisting to help feed. SLP let pt assist in guiding cup and opening mouth to accept po when boluses placed in front of him.  Due to his delay in swallow, advised RN to set up oral suction to use if indicated.    Per RN, pt has lost a significant amount of weight and SLP questions if dysphagia has contributed to this loss.  Recommend consider a palliative consult given pt's advanced age, progressive weight loss, multiple co-morbidities and dysphagia.   Recommend dys3/nectar liquids, medicine with ice cream *crush if not contraindicated as pt will masticated them based on his eval.  SLP will follow up briefly to assure po tolerance and education completed.    Kathleen Lime, MS [facility] SLP Acute Rehab Services Office [phone] Pager [phone]

## 2021-01-13 NOTE — Progress Notes (Signed)
Report received from [facility]. Patient currently asleep in bed, no signs/symptoms of distress. Will continue to monitor. Mitchell Abbott

## 2021-01-13 NOTE — Evaluation (Signed)
Physical Therapy Evaluation Patient Details Name: Mitchell Abbott MRN: [id number] DOB: Aug 15, 1923 Today's Date: 01/13/2021   History of Present Illness  87 y.o. male with medical history significant of advanced Alzheimer's dementia with behavioral disturbance, history of TIA, BPH, COVID in January 2022 and admitted 01/11/21 for Sepsis secondary to CAUTI, sepsis present on admission and Acute hypoxemic respiratory failure  Clinical Impression  Pt admitted with above diagnosis. Pt currently with functional limitations due to the deficits listed below (see PT Problem List). Pt will benefit from skilled PT to increase their independence and safety with mobility to allow discharge to the venue listed below.   Pt requiring total assist for bed mobility at this time.  Pt provided with time and multimodal cues and not able to physically assist with movement.  Per RN, spouse reports pt needs to be ambulatory in order to d/c home.  Pt not ambulatory at this time and requiring increased assist so recommend SNF upon d/c.     Follow Up Recommendations SNF    Equipment Recommendations  Wheelchair cushion (measurements PT);Wheelchair (measurements PT);Hospital bed    Recommendations for Other Services       Precautions / Restrictions Precautions Precautions: Fall Precaution Comments: nonverbal, Alz dementia, currently on oxygen      Mobility  Bed Mobility Overal bed mobility: Needs Assistance Bed Mobility: Supine to Sit;Sit to Supine;Rolling Rolling: Total assist   Supine to sit: HOB elevated;Total assist Sit to supine: HOB elevated;Total assist   General bed mobility comments: pt able to hold bed rail with multimodal cues however unable to physically assist, provided assist for pt's lower body and pt maintained trunk on bed, pt appears too weak and fatigued to sit fully upright even with assist, requried total assist for repositioning    Transfers                     Ambulation/Gait                Stairs            Wheelchair Mobility    Modified Rankin (Stroke Patients Only)       Balance                                             Pertinent Vitals/Pain Pain Assessment: Faces Faces Pain Scale: Hurts even more Pain Location: holds foley line and moans occasionally Pain Descriptors / Indicators: Grimacing;Moaning Pain Intervention(s): Repositioned;Monitored during session Investment banker, corporate notified)    Home Living Family/patient expects to be discharged to:: Private residence Living Arrangements: Spouse/significant other               Additional Comments: no family present and pt nonverbal with hx of dementia; per RN, spouse reports pt needs to be ambulatory to return home    Prior Function Level of Independence: Needs assistance   Gait / Transfers Assistance Needed: likely supervision     Comments: attending day care per spouse per RN     Hand Dominance        Extremity/Trunk Assessment   Upper Extremity Assessment Upper Extremity Assessment: Generalized weakness    Lower Extremity Assessment Lower Extremity Assessment: Generalized weakness       Communication   Communication: HOH  Cognition Arousal/Alertness: Awake/alert Behavior During Therapy: Flat affect Overall Cognitive Status: History of cognitive impairments - at baseline  General Comments: pt nonverbal; inconsistent with following commands but also very HOH      General Comments      Exercises     Assessment/Plan    PT Assessment Patient needs continued PT services  PT Problem List Decreased strength;Decreased mobility;Decreased balance;Decreased knowledge of use of DME;Decreased activity tolerance       PT Treatment Interventions DME instruction;Gait training;Balance training;Therapeutic exercise;Functional mobility training;Therapeutic activities;Patient/family  education;Wheelchair mobility training    PT Goals (Current goals can be found in the Care Plan section)  Acute Rehab PT Goals PT Goal Formulation: Patient unable to participate in goal setting Time For Goal Achievement: 01/27/21 Potential to Achieve Goals: Poor    Frequency Min 2X/week   Barriers to discharge        Co-evaluation               AM-PAC PT "6 Clicks" Mobility  Outcome Measure Help needed turning from your back to your side while in a flat bed without using bedrails?: Total Help needed moving from lying on your back to sitting on the side of a flat bed without using bedrails?: Total Help needed moving to and from a bed to a chair (including a wheelchair)?: Total Help needed standing up from a chair using your arms (e.g., wheelchair or bedside chair)?: Total Help needed to walk in hospital room?: Total Help needed climbing 3-5 steps with a railing? : Total 6 Click Score: 6    End of Session Equipment Utilized During Treatment: Oxygen Activity Tolerance: Patient limited by fatigue Patient left: in bed;with bed alarm set;with call bell/phone within reach Nurse Communication: Mobility status PT Visit Diagnosis: Adult, failure to thrive (R62.7);Muscle weakness (generalized) (M62.81);Difficulty in walking, not elsewhere classified (R26.2)    Time: [id number] PT Time Calculation (min) (ACUTE ONLY): 21 min   Charges:   PT Evaluation $PT Eval Low Complexity: 1 Low        Kati PT, DPT Acute Rehabilitation Services Pager: [phone] Office: [phone]   York Ram E 01/13/2021, 2:34 PM

## 2021-01-13 NOTE — Evaluation (Signed)
Clinical/Bedside Swallow Evaluation Patient Details  Name: Mitchell Abbott MRN: [id number] Date of Birth: February 13, 1924  Today's Date: 01/13/2021 Time: SLP Start Time (ACUTE ONLY): 43 SLP Stop Time (ACUTE ONLY): 1140 SLP Time Calculation (min) (ACUTE ONLY): 35 min  Past Medical History:  Past Medical History:  Diagnosis Date   Actinic keratoses    Adrenal hemorrhage ([place]) 2001   s/p fall from ladder--a f/u CT abd showed resolution of the hemorrhage- (duodenal polyp incidentally noted;  f/u EGD by Dr. Watt Climes 03/2000 showed small lipomatous mass in duodenum which was confirmed by biopsy.   Alzheimer's dementia ([place])    with behav disturbance and apathy.  [facility] referral as of 07/2013.  They added namenda 07/2014 due to pt's signif decline in the prior 6 mo.   Arthralgia of knee, right 2009   exam suspicious for meniscal origin   BPH (benign prostatic hypertrophy)    History of acute urinary retention (2006)  Hx of elevated PSA (7.26) + right sided prostate nodule on DRE by urologist.  PSA did not go down with tx with antibiotics--biopsy was done and showed NO MALIGNANCY but some chronic inflammation was present--turned out related to acute cystitis (enterococcus UTI).  PSA was 1.17 in 07/2011 at urologist's (Dr. Karsten Ro).  F/u at urol is now prn as of 08/2014.   BPPV (benign paroxysmal positional vertigo)    COVID-19 virus infection 06/09/2020   DDD (degenerative disc disease), cervical    C5-6, C6-7 primarily   Headache in back of head    Neurologist treated him with PT for this problem and it helped   Hearing deficit    Bilateral; noise damage in [employer]  Has had hearing aids since about 2008.   Herpes zoster 07/25/2018   suspected--valtrex rx'd.   History of acute cystitis 08/2009   with acute prostatitis (enterococcus faecalis)   History of solitary pulmonary nodule    Initially noted on CT abd for his adrenal hemorrhage s/p fall from ladder 2001.  F/u CT chest 04/2000  showed that it had shrunk from 13m to 666mand had no malignant features.   History of TIA (transient ischemic attack) @ 2008   02/2001 and 01/2006 carotid dopplers showed 50% or less stenosis.   Left bundle branch hemiblock    mentioned in dx of old records 04/15/2009   Migraine headache with aura onset in teens   No migraines since about 2004   Poor dentition    As of 07/2019 being referred to oral surgeon for removal of all implants and all remaining teeth   RSV bronchitis 01/2020   Skin cancer    Past Surgical History:  Past Surgical History:  Procedure Laterality Date   Carotid dopplers  05/2016   1-39% stenosis bilat ICA.   cataract extrac     dental     implants   PROSTATE BIOPSY  04/07/2005   For elevated PSA + right sided prostate nodule on DRE by urologist:  benign biopsy.   TONSILLECTOMY  preteen   TRANSTHORACIC ECHOCARDIOGRAM  06/28/2016   EF 60-65%, mild dilatation of ascending aorta.   HPI:  [age].o. male with medical history significant of advanced Alzheimer's dementia with behavioral disturbance, history of TIA, BPH, COVID in January 2022 and admitted 01/11/21 for Sepsis secondary to CAUTI, sepsis present on admission and Acute hypoxemic respiratory failure dementia most recent CT head Generalized cerebral atrophy, Marked severity right maxillary sinus disease. CXR ATX.   Assessment / Plan / Recommendation Clinical  Impression  SLP attempted to call wife at number on the board but it went to voice mail.  Pt only tested with ice cream, nectar juice via tsp, cup, straw and graham crackers.  He was sleepy but participative and did not vocalize *except to appear to sing x1 briefly*.  Prolonged oral holding clinically present with pt masticating all boluses.  Delayed swallow noted up to 12 seconds with liquids and 32 seconds with small moist solid, even with pt assisting to help feed. SLP let pt assist in guiding cup and opening mouth to accept po when boluses placed in front of  him.  Due to his delay in swallow, advised RN to set up oral suction to use if indicated.         Per RN, pt has lost a significant amount of weight and SLP questions if dysphagia has contributed to this loss.  Recommend consider a palliative consult given pt's advanced age, progressive weight loss, multiple co-morbidities and dysphagia.        Recommend dys3/nectar liquids, medicine with ice cream *crush if not contraindicated as pt will masticated them based on his eval.  SLP will follow up briefly to assure po tolerance and education completed. SLP Visit Diagnosis: Dysphagia, oropharyngeal phase (R13.12)    Aspiration Risk  Moderate aspiration risk;Risk for inadequate nutrition/hydration;Severe aspiration risk    Diet Recommendation Nectar-thick liquid;Dysphagia 3 (Mech soft)   Liquid Administration via: Cup;Straw Medication Administration: Crushed with puree Supervision: Full supervision/cueing for compensatory strategies Compensations: Slow rate;Small sips/bites;Follow solids with liquid Postural Changes: Seated upright at 90 degrees;Remain upright for at least 30 minutes after po intake    Other  Recommendations Recommended Consults: Consider esophageal assessment Oral Care Recommendations: Oral care BID Other Recommendations: Order thickener from pharmacy   Follow up Recommendations Other (comment) (TBD)      Frequency and Duration min 1 x/week  1 week       Prognosis Prognosis for Safe Diet Advancement: Fair Barriers to Reach Goals: Severity of deficits;Cognitive deficits      Swallow Study   General Date of Onset: 01/13/21 HPI: 87 y.o. male with medical history significant of advanced Alzheimer's dementia with behavioral disturbance, history of TIA, BPH, COVID in January 2022 and admitted 01/11/21 for Sepsis secondary to CAUTI, sepsis present on admission and Acute hypoxemic respiratory failure dementia most recent CT head Generalized cerebral atrophy, Marked severity  right maxillary sinus disease. CXR ATX. Type of Study: Bedside Swallow Evaluation Diet Prior to this Study: Regular;Thin liquids Temperature Spikes Noted: Yes Respiratory Status: Room air History of Recent Intubation: No Behavior/Cognition: Alert;Cooperative;Pleasant mood Oral Cavity Assessment: Within Functional Limits Oral Care Completed by SLP: No Oral Cavity - Dentition: Adequate natural dentition Vision: Functional for self-feeding Self-Feeding Abilities: Able to feed self Patient Positioning: Upright in bed Baseline Vocal Quality: Not observed Volitional Cough: Strong Volitional Swallow: Able to elicit    Oral/Motor/Sensory Function Overall Oral Motor/Sensory Function: Other (comment) (no focal deficits, pt did not follow directions)   Ice Chips Ice chips: Not tested   Thin Liquid Thin Liquid: Not tested    Nectar Thick Nectar Thick Liquid: Impaired Presentation: Cup;Spoon;Straw Oral phase functional implications: Prolonged oral transit Pharyngeal Phase Impairments: Suspected delayed Swallow   Honey Thick Honey Thick Liquid: Not tested   Puree Puree: Impaired Presentation: Self Fed;Spoon Oral Phase Impairments: Other (comment);Reduced labial seal Oral Phase Functional Implications: Prolonged oral transit Pharyngeal Phase Impairments: Suspected delayed Swallow   Solid     Solid: Not tested  Macario Golds 01/13/2021,7:53 PM  Kathleen Lime, MS [facility] SLP Acute Rehab Services Office [phone] Pager [phone]

## 2021-01-13 NOTE — Progress Notes (Signed)
PROGRESS NOTE    [name]  UYQ:[id number] DOB: 19-Nov-1923 DOA: 01/11/2021 PCP: Tammi Sou, MD     Brief Narrative:  Mitchell Abbott is a 87 y.o. male with medical history significant of advanced Alzheimer's dementia with behavioral disturbance, history of TIA, BPH, COVID in January 2022 who presented to [place] on 8/14 with complaints of intermittent elevated temperature but no real fevers at home.  Patient is nonverbal and wife provides history.  Patient was seen 1 week ago and treated with Augmentin on 8/8 for aphthous ulcers with concern for infection in his mouth, has had intermittent fevers for about a week at home, seem to be more agitated.  He was seen by urology on Wednesday and had a Foley catheter replaced 8/10, had 1922m UOP at that time.  Per wife, patient has been weaker than his baseline.   Patient lives with his wife at home at baseline, has caregiver come to help him 2 hours every morning and 6 hours over the weekend.  He is dependent on all activities of daily living.   Patient with sepsis criteria with fever, leukocytosis.  Sepsis thought to be secondary to urinary source.  [facility]  New events last 24 hours / Subjective: Wife is at bedside, feeding him breakfast this morning.  Patient appears to be much more alert and comfortable, closer to his baseline.  Wife would like to make sure that he can walk, states that he attends daycare during the day   Assessment & Plan:   Principal Problem:   Sepsis secondary to UTI ([facility] Active Problems:   Benign prostatic hyperplasia   History of TIA (transient ischemic attack)   [place]   Sepsis secondary to CAUTI, sepsis present on admission -Patient met sepsis criteria with fever 100.8, leukocytosis 15.3 -Foley catheter placed by urology as outpatient 8/10 -UA reveals many bacteria.   Urine culture showing Pseudomonas, susceptibilities to follow -Blood culture pending -Cefepime  Acute hypoxemic respiratory failure -[facility] to 79% on room air.  Chest x-ray revealed mild bibasilar atelectasis -Continue to monitor and wean oxygen, home desat screen ordered   History of TIA -Continue aspirin   Advanced Alzheimer's dementia -Nonverbal at baseline  Weakness -PT OT consult  Dysphagia -SLP evaluation ordered  Acute urinary retention -Foley catheter was placed as an outpatient 8/10 -Continue Flomax   DVT prophylaxis:  enoxaparin (LOVENOX) injection 40 mg Start: 01/12/21 1800  Code Status:     Code Status Orders  (From admission, onward)           Start     Ordered   01/12/21 1659  Do not attempt resuscitation (DNR)  Continuous       Question Answer Comment  In the event of cardiac or respiratory ARREST Do not call a "code blue"   In the event of cardiac or respiratory ARREST Do not perform Intubation, CPR, defibrillation or ACLS   In the event of cardiac or respiratory ARREST Use medication by any route, position, wound care, and other measures to relive pain and suffering. May use oxygen, suction and manual treatment of airway obstruction as needed for comfort.      01/12/21 1700           Code Status History     Date Active Date Inactive Code Status Order ID Comments User Context  06/27/2016 1016 06/28/2016 2228 Full Code [id number]  Patrecia Pour, MD Inpatient      Advance Directive Documentation    Flowsheet Row Most Recent Value  Type of Advance Directive Healthcare Power of Attorney, Living will  Pre-existing out of facility DNR order (yellow form or pink MOST form) --  "MOST" Form in Place? --      Family Communication: Wife at bedside Disposition Plan:  Status is: Inpatient  Remains inpatient appropriate because:Inpatient level of care appropriate due to severity of illness  Dispo:  The patient is from: Home              Anticipated d/c is to: Home              Patient currently is not medically stable to d/c.   Difficult to place patient No      Consultants:  None  Procedures:  None  Antimicrobials:  Anti-infectives (From admission, onward)    Start     Dose/Rate Route Frequency Ordered Stop   01/13/21 0600  ceFEPIme (MAXIPIME) 2 g in sodium chloride 0.9 % 100 mL IVPB        2 g 200 mL/hr over 30 Minutes Intravenous Every 12 hours 01/12/21 1714 01/20/21 0559   01/12/21 1800  ceFEPIme (MAXIPIME) 2 g in sodium chloride 0.9 % 100 mL IVPB        2 g 200 mL/hr over 30 Minutes Intravenous  Once 01/12/21 1700 01/12/21 1913   01/12/21 1730  cefTRIAXone (ROCEPHIN) 1 g in sodium chloride 0.9 % 100 mL IVPB  Status:  Discontinued        1 g 200 mL/hr over 30 Minutes Intravenous Every 24 hours 01/12/21 1644 01/12/21 1700   01/11/21 2130  cefTRIAXone (ROCEPHIN) 1 g in sodium chloride 0.9 % 100 mL IVPB        1 g 200 mL/hr over 30 Minutes Intravenous  Once 01/11/21 2128 01/11/21 2216        Objective: Vitals:   01/12/21 1634 01/12/21 2124 01/13/21 0432 01/13/21 0932  BP: (!) 160/72 120/69 (!) 147/87   Pulse: 98 85 75   Resp: (!) 22 18 18    Temp: 98.2 F (36.8 C) 98.4 F (36.9 C) (!) 97.2 F (36.2 C)   TempSrc: Axillary Oral Oral   SpO2: (!) 78% 96% 91% 95%  Weight: 60.3 kg     Height: 5' 7"  (1.702 m)       Intake/Output Summary (Last 24 hours) at 01/13/2021 1202 Last data filed at 01/13/2021 0600 Gross per 24 hour  Intake 505.92 ml  Output 600 ml  Net -94.08 ml   Filed Weights   01/11/21 1932 01/12/21 1634  Weight: 60.3 kg 60.3 kg    Examination:  General exam: Appears calm  Respiratory system: Clear to auscultation. Respiratory effort normal. No respiratory distress.  Cardiovascular system: S1 & S2 heard, RRR. No murmurs. No pedal edema. Gastrointestinal system: Abdomen is nondistended, soft and nontender.  Central nervous system: Alert   Extremities: Symmetric in appearance  Skin: No rashes, lesions or ulcers on exposed skin  Psychiatry: Nonverbal at baseline  Data Reviewed: I have personally reviewed following labs and imaging studies  CBC: Recent Labs  Lab 01/11/21 1940 01/12/21 1714 01/13/21 0409  WBC 15.3* 11.2* 13.0*  NEUTROABS 11.4*  --   --   HGB 13.2 13.8 12.3*  HCT 39.5 43.2 38.0*  MCV 99.2 101.9* 101.6*  PLT 327 351 349   Basic Metabolic Panel: Recent  Labs  Lab 01/11/21 1940 01/12/21 1714 01/13/21 0409  NA 135 136 138  K 4.8 4.2 4.8  CL 101 104 106  CO2 25 25 24   GLUCOSE 119* 123* 106*  BUN 25* 17 17  CREATININE 1.07 0.73 0.78  CALCIUM 8.6* 8.4* 8.3*   GFR: Estimated Creatinine Clearance: 48.2 mL/min (by C-G formula based on SCr of 0.78 mg/dL). Liver Function Tests: Recent Labs  Lab 01/11/21 1940  AST 35  ALT 30  ALKPHOS 74  BILITOT 0.4  PROT 6.4*  ALBUMIN 2.9*   No results for input(s): LIPASE, AMYLASE in the last 168 hours. No results for input(s): AMMONIA in the last 168 hours. Coagulation Profile: No results for input(s): INR, PROTIME in the last 168 hours. Cardiac Enzymes: No results for input(s): CKTOTAL, CKMB, CKMBINDEX, TROPONINI in the last 168 hours. BNP (last 3 results) No results for input(s): PROBNP in the last 8760 hours. HbA1C: No results for input(s): HGBA1C in the last 72 hours. CBG: Recent Labs  Lab 01/11/21 1950  GLUCAP 128*   Lipid Profile: No results for input(s): CHOL, HDL, LDLCALC, TRIG, CHOLHDL, LDLDIRECT in the last 72 hours. Thyroid Function Tests: No results for input(s): TSH, T4TOTAL, FREET4, T3FREE, THYROIDAB in the last 72 hours. Anemia Panel: No results for input(s): VITAMINB12, FOLATE, FERRITIN, TIBC, IRON, RETICCTPCT in the last 72 hours. Sepsis Labs: Recent Labs  Lab 01/11/21 1940 01/11/21 2204  LATICACIDVEN 2.8* 1.8    Recent Results (from the past 240 hour(s))  Blood culture (routine x 2)     Status: None (Preliminary result)    Collection Time: 01/11/21  7:40 PM   Specimen: BLOOD LEFT FOREARM  Result Value Ref Range Status   Specimen Description   Final    BLOOD LEFT FOREARM BLOOD Performed at [facility], [address] [place], [place] [address]    Special Requests   Final    BOTTLES DRAWN AEROBIC AND ANAEROBIC Blood Culture adequate volume Performed at [facility], [address] [place], [place] [address]    Culture   Final    NO GROWTH < 12 HOURS Performed at [facility], [place] [address], [place], [place] [address]    Report Status PENDING  Incomplete  Urine Culture     Status: Abnormal (Preliminary result)   Collection Time: 01/11/21  8:02 PM   Specimen: Urine, Random  Result Value Ref Range Status   Specimen Description   Final    URINE, RANDOM Performed at [facility], [address] [place], [place] [address]    Special Requests   Final    NONE Performed at [facility], [address] [place], [place] [address]    Culture (A)  Final    30,000 COLONIES/mL PSEUDOMONAS AERUGINOSA SUSCEPTIBILITIES TO FOLLOW Performed at [facility], [place] [address], [place], [place] [address]    Report Status PENDING  Incomplete  Resp Panel by RT-PCR (Flu A&B, Covid) Nasopharyngeal Swab     Status: None   Collection Time: 01/11/21  8:28 PM   Specimen: Nasopharyngeal Swab; Nasopharyngeal(NP) swabs in vial transport medium  Result Value Ref Range Status   SARS Coronavirus 2 by RT PCR NEGATIVE NEGATIVE Final    Comment: (NOTE) SARS-CoV-2 target nucleic acids are NOT DETECTED.  The SARS-CoV-2 RNA is generally detectable in upper respiratory specimens during the acute phase of infection. The lowest concentration of SARS-CoV-2 viral copies this assay can  detect is 138 copies/mL. A negative result does not preclude SARS-Cov-2 infection and should not be used as the sole basis for treatment or other patient management decisions. A negative result  may occur with  improper specimen collection/handling, submission of specimen other than nasopharyngeal swab, presence of viral mutation(s) within the areas targeted by this assay, and inadequate number of viral copies(<138 copies/mL). A negative result must be combined with clinical observations, patient history, and epidemiological information. The expected result is Negative.  Fact Sheet for Patients:  EntrepreneurPulse.com.au  Fact Sheet for Healthcare Providers:  IncredibleEmployment.be  This test is no t yet approved or cleared by the [place] FDA and  has been authorized for detection and/or diagnosis of SARS-CoV-2 by FDA under an Emergency Use Authorization (EUA). This EUA will remain  in effect (meaning this test can be used) for the duration of the COVID-19 declaration under Section 564(b)(1) of the Act, 21 U.S.C.section 360bbb-3(b)(1), unless the authorization is terminated  or revoked sooner.       Influenza A by PCR NEGATIVE NEGATIVE Final   Influenza B by PCR NEGATIVE NEGATIVE Final    Comment: (NOTE) The Xpert Xpress SARS-CoV-2/FLU/RSV plus assay is intended as an aid in the diagnosis of influenza from Nasopharyngeal swab specimens and should not be used as a sole basis for treatment. Nasal washings and aspirates are unacceptable for Xpert Xpress SARS-CoV-2/FLU/RSV testing.  Fact Sheet for Patients: EntrepreneurPulse.com.au  Fact Sheet for Healthcare Providers: IncredibleEmployment.be  This test is not yet approved or cleared by the [place] FDA and has been authorized for detection and/or diagnosis of SARS-CoV-2 by FDA under an Emergency Use Authorization (EUA). This EUA will remain in effect (meaning this test can be used) for the duration of the COVID-19 declaration under Section 564(b)(1) of the Act, 21 U.S.C. section 360bbb-3(b)(1), unless the authorization is terminated  or revoked.  Performed at [facility], [address] [place], [place] [address]   Blood culture (routine x 2)     Status: None (Preliminary result)   Collection Time: 01/11/21  8:36 PM   Specimen: BLOOD RIGHT FOREARM  Result Value Ref Range Status   Specimen Description   Final    BLOOD RIGHT FOREARM BLOOD Performed at [facility], [address] [place], [place] [address]    Special Requests   Final    BOTTLES DRAWN AEROBIC AND ANAEROBIC Blood Culture adequate volume Performed at [facility], [address] [place], [place] [address]    Culture   Final    NO GROWTH < 12 HOURS Performed at [facility], [place] [address] [place], [place] [address]    Report Status PENDING  Incomplete      Radiology Studies: DG Chest Port 1 View  Result Date: 01/11/2021 CLINICAL DATA:  Shortness of breath and fevers EXAM: PORTABLE CHEST 1 VIEW COMPARISON:  11/27/2020 FINDINGS: Cardiac shadow is stable. Aortic calcifications are noted. The lungs are well aerated bilaterally. New basilar atelectasis is seen bilaterally left greater than right. No bony abnormality is noted. IMPRESSION: Mild bibasilar atelectasis. Electronically Signed   By: Inez Catalina M.D.   On: 01/11/2021 20:50      Scheduled Meds:  aspirin EC  162 mg Oral Daily   Chlorhexidine Gluconate Cloth  6 each Topical Daily   enoxaparin (LOVENOX) injection  40 mg Subcutaneous Q24H   feeding supplement  237 mL Oral BID BM   sodium chloride flush  3 mL Intravenous Q12H   tamsulosin  0.4 mg Oral QPC supper   Continuous Infusions:  ceFEPime (MAXIPIME) IV 2 g (01/13/21 0538)     LOS: 1 day      Time spent: 25 minutes   Dessa Phi, DO [facility] 01/13/2021, 12:02 PM   Available via Epic secure chat 7am-7pm After these hours, please refer to coverage provider listed on [email or web address]

## 2021-01-14 DIAGNOSIS — Z8673 Personal history of transient ischemic attack (TIA), and cerebral infarction without residual deficits: Secondary | ICD-10-CM

## 2021-01-14 DIAGNOSIS — G301 Alzheimer's disease with late onset: Secondary | ICD-10-CM

## 2021-01-14 DIAGNOSIS — F0280 Dementia in other diseases classified elsewhere without behavioral disturbance: Secondary | ICD-10-CM

## 2021-01-14 LAB — URINE CULTURE: Culture: 30000 — AB

## 2021-01-14 LAB — BASIC METABOLIC PANEL
Anion gap: 9 (ref 5–15)
BUN: 20 mg/dL (ref 8–23)
CO2: 21 mmol/L — ABNORMAL LOW (ref 22–32)
Calcium: 8.1 mg/dL — ABNORMAL LOW (ref 8.9–10.3)
Chloride: 107 mmol/L (ref 98–111)
Creatinine, Ser: 0.73 mg/dL (ref 0.61–1.24)
GFR, Estimated: >60 mL/min (ref >60)
Glucose, Bld: 98 mg/dL (ref 70–99)
Potassium: 4.3 mmol/L (ref 3.5–5.1)
Sodium: 137 mmol/L (ref 135–145)

## 2021-01-14 LAB — CBC
HCT: 39.5 % (ref 39.0–52.0)
Hemoglobin: 12.5 g/dL — ABNORMAL LOW (ref 13.0–17.0)
MCH: 32.5 pg (ref 26.0–34.0)
MCHC: 31.6 g/dL (ref 30.0–36.0)
MCV: 102.6 fL — ABNORMAL HIGH (ref 80.0–100.0)
Platelets: 325 10*3/uL (ref 150–400)
RBC: 3.85 MIL/uL — ABNORMAL LOW (ref 4.22–5.81)
RDW: 13.4 % (ref 11.5–15.5)
WBC: 9.4 10*3/uL (ref 4.0–10.5)
nRBC: 0 % (ref 0.0–0.2)

## 2021-01-14 MED ORDER — CIPROFLOXACIN HCL 500 MG PO TABS
500.0000 mg | ORAL_TABLET | Freq: Two times a day (BID) | ORAL | Status: DC
  Administered 2021-01-14 – 2021-01-17 (×6): 500 mg via ORAL
  Filled 2021-01-14 (×6): qty 1

## 2021-01-14 NOTE — Progress Notes (Signed)
[facility]  PROGRESS NOTE  IDREES ORLANDINI [id number] DOB: 11/13/1923 DOA: 01/11/2021 PCP: Tammi Sou, MD   Brief HPI:    87 year old male with medical history of advanced Zimmers dementia with behavioral disturbance, history of TIA, BPH, COVID in January 2022 presented to [place] on 8/14 with complaints of intermittent elevated temperature but no real fever at home.  Patient is nonverbal and wife provides history. Was found to have abnormal UA, started on Rocephin for possible sepsis due to UTI.    Subjective   Patient seen and examined, nonverbal, no new complaints.  Urine culture grew 30,000 colonies per mL of Pseudomonas aeruginosa, sensitive to ciprofloxacin.   Assessment/Plan:    Sepsis due to CAUTI, POA -Presented with sepsis, fever 100.8, leukocytosis 15.3 -Foley catheter was placed as outpatient on 01/07/2021 -UA showed many bacteria, urine culture grew Pseudomonas -We will discontinue IV cefepime and start p.o. ciprofloxacin  Acute hypoxemic respiratory failure -O2 sats on presentation was 79% on room air -Chest x-ray showed mild bibasilar atelectasis -Continue to wean off oxygen as tolerated  History of TIA -Continue aspirin  Advanced exams dementia -Nonverbal; at baseline  Dysphagia -Speech therapy evaluation done -Started on dysphagia 3 diet, nectar thick liquid  Urinary retention -Foley catheter placed as outpatient -Continue Flomax    Scheduled medications:    aspirin EC  162 mg Oral Daily   Chlorhexidine Gluconate Cloth  6 each Topical Daily   ciprofloxacin  500 mg Oral BID   enoxaparin (LOVENOX) injection  40 mg Subcutaneous Q24H   feeding supplement  237 mL Oral BID BM   multivitamin with minerals  1 tablet Oral Daily   sodium chloride flush  3 mL Intravenous Q12H   tamsulosin  0.4 mg Oral QPC supper         Data Reviewed:   CBG:  Recent Labs  Lab 01/11/21 1950  GLUCAP 128*    SpO2: 96 % O2  Flow Rate (L/min): 2.5 L/min    Vitals:   01/14/21 0540 01/14/21 0920 01/14/21 0933 01/14/21 1219  BP: (!) 167/87   136/72  Pulse: 72   74  Resp: 16   16  Temp:    98.5 F (36.9 C)  TempSrc:    Oral  SpO2: 96% 99% 92% 96%  Weight:      Height:         Intake/Output Summary (Last 24 hours) at 01/14/2021 1759 Last data filed at 01/14/2021 0920 Gross per 24 hour  Intake 540 ml  Output 900 ml  Net -360 ml    08/15 1901 - 08/17 0700 In: 400.1 [P.O.:200] Out: 1500 [Urine:1500]  Filed Weights   01/11/21 1932 01/12/21 1634  Weight: 60.3 kg 60.3 kg    CBC:  Recent Labs  Lab 01/11/21 1940 01/12/21 1714 01/13/21 0409 01/14/21 0809  WBC 15.3* 11.2* 13.0* 9.4  HGB 13.2 13.8 12.3* 12.5*  HCT 39.5 43.2 38.0* 39.5  PLT 327 351 330 325  MCV 99.2 101.9* 101.6* 102.6*  MCH 33.2 32.5 32.9 32.5  MCHC 33.4 31.9 32.4 31.6  RDW 13.2 13.3 13.2 13.4  LYMPHSABS 1.7  --   --   --   MONOABS 1.7*  --   --   --   EOSABS 0.1  --   --   --   BASOSABS 0.1  --   --   --     Complete metabolic panel:  Recent Labs  Lab 01/11/21 1940 01/11/21 2204 01/12/21  1714 01/13/21 0409 01/14/21 0809  NA 135  --  136 138 137  K 4.8  --  4.2 4.8 4.3  CL 101  --  104 106 107  CO2 25  --  25 24 21*  GLUCOSE 119*  --  123* 106* 98  BUN 25*  --  '17 17 20  '$ CREATININE 1.07  --  0.73 0.78 0.73  CALCIUM 8.6*  --  8.4* 8.3* 8.1*  AST 35  --   --   --   --   ALT 30  --   --   --   --   ALKPHOS 74  --   --   --   --   BILITOT 0.4  --   --   --   --   ALBUMIN 2.9*  --   --   --   --   LATICACIDVEN 2.8* 1.8  --   --   --   BNP 140.7*  --   --   --   --     No results for input(s): LIPASE, AMYLASE in the last 168 hours.  Recent Labs  Lab 01/11/21 1940 01/11/21 2028  BNP 140.7*  --   SARSCOV2NAA  --  NEGATIVE    ------------------------------------------------------------------------------------------------------------------ No results for input(s): CHOL, HDL, LDLCALC, TRIG, CHOLHDL,  LDLDIRECT in the last 72 hours.  No results found for: HGBA1C ------------------------------------------------------------------------------------------------------------------ No results for input(s): TSH, T4TOTAL, T3FREE, THYROIDAB in the last 72 hours.  Invalid input(s): FREET3 ------------------------------------------------------------------------------------------------------------------ No results for input(s): VITAMINB12, FOLATE, FERRITIN, TIBC, IRON, RETICCTPCT in the last 72 hours.  Coagulation profile No results for input(s): INR, PROTIME in the last 168 hours. No results for input(s): DDIMER in the last 72 hours.  Cardiac Enzymes No results for input(s): CKTOTAL, CKMB, CKMBINDEX, TROPONINI in the last 168 hours.  ------------------------------------------------------------------------------------------------------------------    Component Value Date/Time   BNP 140.7 (H) 01/11/2021 1940     Antibiotics: Anti-infectives (From admission, onward)    Start     Dose/Rate Route Frequency Ordered Stop   01/14/21 2000  ciprofloxacin (CIPRO) tablet 500 mg        500 mg Oral 2 times daily 01/14/21 1127     01/13/21 0600  ceFEPIme (MAXIPIME) 2 g in sodium chloride 0.9 % 100 mL IVPB  Status:  Discontinued        2 g 200 mL/hr over 30 Minutes Intravenous Every 12 hours 01/12/21 1714 01/14/21 1127   01/12/21 1800  ceFEPIme (MAXIPIME) 2 g in sodium chloride 0.9 % 100 mL IVPB        2 g 200 mL/hr over 30 Minutes Intravenous  Once 01/12/21 1700 01/12/21 1913   01/12/21 1730  cefTRIAXone (ROCEPHIN) 1 g in sodium chloride 0.9 % 100 mL IVPB  Status:  Discontinued        1 g 200 mL/hr over 30 Minutes Intravenous Every 24 hours 01/12/21 1644 01/12/21 1700   01/11/21 2130  cefTRIAXone (ROCEPHIN) 1 g in sodium chloride 0.9 % 100 mL IVPB        1 g 200 mL/hr over 30 Minutes Intravenous  Once 01/11/21 2128 01/11/21 2216        Radiology Reports  No results found.    DVT  prophylaxis: Lovenox  Code Status: DNR  Family Communication: Discussed with patient wife at bedside   Consultants:   Procedures:     Objective    Physical Examination:   General-appears in no acute distress Heart-S1-S2, regular, no  murmur auscultated Lungs-clear to auscultation bilaterally, no wheezing or crackles auscultated Abdomen-soft, nontender, no organomegaly Extremities-no edema in the lower extremities Neuro-alert, nonverbal  Status is: Inpatient  Dispo: The patient is from: Home              Anticipated d/c is to: Skilled nursing facility              Anticipated d/c date is: 01/17/2021              Patient currently not stable for discharge  Barrier to discharge-ongoing treatment for UTI  COVID-19 Labs  No results for input(s): DDIMER, FERRITIN, LDH, CRP in the last 72 hours.  Lab Results  Component Value Date   SARSCOV2NAA NEGATIVE 01/11/2021   SARSCOV2NAA NEGATIVE 11/27/2020   SARSCOV2NAA Detected (A) 06/09/2020   [place] Not Detected 05/21/2019    Microbiology  Recent Results (from the past 240 hour(s))  Blood culture (routine x 2)     Status: None (Preliminary result)   Collection Time: 01/11/21  7:40 PM   Specimen: BLOOD LEFT FOREARM  Result Value Ref Range Status   Specimen Description   Final    BLOOD LEFT FOREARM BLOOD Performed at [facility], [address] [place], [place] [address]    Special Requests   Final    BOTTLES DRAWN AEROBIC AND ANAEROBIC Blood Culture adequate volume Performed at [facility], [address] [place], [place] [address]    Culture  Setup Time GRAM NEGATIVE RODS AEROBIC BOTTLE ONLY   Final   Culture GRAM NEGATIVE RODS  Final   Report Status PENDING  Incomplete  Blood Culture ID Panel (Reflexed)     Status: None   Collection Time: 01/11/21  7:40 PM  Result Value Ref Range Status   Enterococcus faecalis NOT DETECTED NOT DETECTED Final   Enterococcus Faecium NOT DETECTED  NOT DETECTED Final   Listeria monocytogenes NOT DETECTED NOT DETECTED Final   Staphylococcus species NOT DETECTED NOT DETECTED Final   Staphylococcus aureus (BCID) NOT DETECTED NOT DETECTED Final   Staphylococcus epidermidis NOT DETECTED NOT DETECTED Final   Staphylococcus lugdunensis NOT DETECTED NOT DETECTED Final   Streptococcus species NOT DETECTED NOT DETECTED Final   Streptococcus agalactiae NOT DETECTED NOT DETECTED Final   Streptococcus pneumoniae NOT DETECTED NOT DETECTED Final   Streptococcus pyogenes NOT DETECTED NOT DETECTED Final   A.calcoaceticus-baumannii NOT DETECTED NOT DETECTED Final   Bacteroides fragilis NOT DETECTED NOT DETECTED Final   Enterobacterales NOT DETECTED NOT DETECTED Final   Enterobacter cloacae complex NOT DETECTED NOT DETECTED Final   Escherichia coli NOT DETECTED NOT DETECTED Final   Klebsiella aerogenes NOT DETECTED NOT DETECTED Final   Klebsiella oxytoca NOT DETECTED NOT DETECTED Final   Klebsiella pneumoniae NOT DETECTED NOT DETECTED Final   Proteus species NOT DETECTED NOT DETECTED Final   Salmonella species NOT DETECTED NOT DETECTED Final   Serratia marcescens NOT DETECTED NOT DETECTED Final   Haemophilus influenzae NOT DETECTED NOT DETECTED Final   Neisseria meningitidis NOT DETECTED NOT DETECTED Final   Pseudomonas aeruginosa NOT DETECTED NOT DETECTED Final   Stenotrophomonas maltophilia NOT DETECTED NOT DETECTED Final   Candida albicans NOT DETECTED NOT DETECTED Final   Candida auris NOT DETECTED NOT DETECTED Final   Candida glabrata NOT DETECTED NOT DETECTED Final   Candida krusei NOT DETECTED NOT DETECTED Final   Candida parapsilosis NOT DETECTED NOT DETECTED Final   Candida tropicalis NOT DETECTED NOT DETECTED Final  Cryptococcus neoformans/gattii NOT DETECTED NOT DETECTED Final    Comment: Performed at [facility], [place] [address], [place], [place] [address]  Urine Culture     Status: Abnormal   Collection Time: 01/11/21  8:02  PM   Specimen: Urine, Random  Result Value Ref Range Status   Specimen Description   Final    URINE, RANDOM Performed at [facility], [address] [place], [place] [address]    Special Requests   Final    NONE Performed at [facility], [address] [place], [place] [address]    Culture 30,000 COLONIES/mL PSEUDOMONAS AERUGINOSA (A)  Final   Report Status 01/14/2021 FINAL  Final   Organism ID, Bacteria PSEUDOMONAS AERUGINOSA (A)  Final      Susceptibility   Pseudomonas aeruginosa - MIC*    CEFTAZIDIME 4 SENSITIVE Sensitive     CIPROFLOXACIN <=0.25 SENSITIVE Sensitive     GENTAMICIN <=1 SENSITIVE Sensitive     IMIPENEM 2 SENSITIVE Sensitive     PIP/TAZO 8 SENSITIVE Sensitive     CEFEPIME 2 SENSITIVE Sensitive     * 30,000 COLONIES/mL PSEUDOMONAS AERUGINOSA  Resp Panel by RT-PCR (Flu A&B, Covid) Nasopharyngeal Swab     Status: None   Collection Time: 01/11/21  8:28 PM   Specimen: Nasopharyngeal Swab; Nasopharyngeal(NP) swabs in vial transport medium  Result Value Ref Range Status   SARS Coronavirus 2 by RT PCR NEGATIVE NEGATIVE Final    Comment: (NOTE) SARS-CoV-2 target nucleic acids are NOT DETECTED.  The SARS-CoV-2 RNA is generally detectable in upper respiratory specimens during the acute phase of infection. The lowest concentration of SARS-CoV-2 viral copies this assay can detect is 138 copies/mL. A negative result does not preclude SARS-Cov-2 infection and should not be used as the sole basis for treatment or other patient management decisions. A negative result may occur with  improper specimen collection/handling, submission of specimen other than nasopharyngeal swab, presence of viral mutation(s) within the areas targeted by this assay, and inadequate number of viral copies(<138 copies/mL). A negative result must be combined with clinical observations, patient history, and epidemiological information. The expected result is Negative.  Fact  Sheet for Patients:  EntrepreneurPulse.com.au  Fact Sheet for Healthcare Providers:  IncredibleEmployment.be  This test is no t yet approved or cleared by the [place] FDA and  has been authorized for detection and/or diagnosis of SARS-CoV-2 by FDA under an Emergency Use Authorization (EUA). This EUA will remain  in effect (meaning this test can be used) for the duration of the COVID-19 declaration under Section 564(b)(1) of the Act, 21 U.S.C.section 360bbb-3(b)(1), unless the authorization is terminated  or revoked sooner.       Influenza A by PCR NEGATIVE NEGATIVE Final   Influenza B by PCR NEGATIVE NEGATIVE Final    Comment: (NOTE) The Xpert Xpress SARS-CoV-2/FLU/RSV plus assay is intended as an aid in the diagnosis of influenza from Nasopharyngeal swab specimens and should not be used as a sole basis for treatment. Nasal washings and aspirates are unacceptable for Xpert Xpress SARS-CoV-2/FLU/RSV testing.  Fact Sheet for Patients: EntrepreneurPulse.com.au  Fact Sheet for Healthcare Providers: IncredibleEmployment.be  This test is not yet approved or cleared by the [place] FDA and has been authorized for detection and/or diagnosis of SARS-CoV-2 by FDA under an Emergency Use Authorization (EUA). This EUA will remain in effect (meaning this test can be used) for the duration of the COVID-19 declaration under Section 564(b)(1) of  the Act, 21 U.S.C. section 360bbb-3(b)(1), unless the authorization is terminated or revoked.  Performed at [facility], [address] [place], [place] [address]   Blood culture (routine x 2)     Status: None (Preliminary result)   Collection Time: 01/11/21  8:36 PM   Specimen: BLOOD RIGHT FOREARM  Result Value Ref Range Status   Specimen Description   Final    BLOOD RIGHT FOREARM BLOOD Performed at [facility], [address] [place], [place] [address]    Special Requests   Final    BOTTLES DRAWN AEROBIC AND ANAEROBIC Blood Culture adequate volume Performed at [facility], [address] [place], [place] [address]    Culture   Final    NO GROWTH 3 DAYS Performed at [facility], [place] [address], [place], [place] [address]    Report Status PENDING  Incomplete        Oswald Hillock   [facility] If 7PM-7AM, please contact night-coverage at [email or web address], Office  [phone]   01/14/2021, 5:59 PM  LOS: 2 days

## 2021-01-14 NOTE — Evaluation (Signed)
Occupational Therapy Evaluation Patient Details Name: Mitchell Abbott MRN: [id number] DOB: Oct 11, 1923 Today's Date: 01/14/2021    History of Present Illness 87 y.o. male with medical history significant of advanced Alzheimer's dementia with behavioral disturbance, history of TIA, BPH, COVID in January 2022 and admitted 01/11/21 for Sepsis secondary to CAUTI, sepsis present on admission and Acute hypoxemic respiratory failure   Clinical Impression   Mitchell Abbott is a 87 year old man who lives at home with his wife with advanced dementia. On evaluation he presents with generalized weakness, impaired balance, impaired cognition and decreased activity tolerance. Patient needing more assistance for bed mobility and standing compared to his baseline. For ADLs - he is near total assist except he can feed himself and assist with grooming with verbal cues. In discussion with his wife patient's wife wanting to take patient home and she demonstrated the physical ability to do so though she starts he is requiring more assistance to stand. From an ADL stand point patient is at his baseline with no further OT needs. Defer to PT for further recommendations.     Follow Up Recommendations  No OT follow up    Equipment Recommendations  None recommended by OT    Recommendations for Other Services       Precautions / Restrictions Precautions Precautions: Fall Precaution Comments: nonverbal, Alz dementia, Restrictions Weight Bearing Restrictions: No      Mobility Bed Mobility   Bed Mobility: Supine to Sit     Supine to sit: Mod assist     General bed mobility comments: Mod assist to transfer with multimodal cues.    Transfers Overall transfer level: Needs assistance Equipment used: Rolling walker (2 wheeled) Transfers: Sit to/from Stand Sit to Stand: Mod assist         General transfer comment: Mod assist to stand. Min assist with RW and verbal cues to take steps (wife  assisting patient as she does at home). Reports increased assistance needed for standing.    Balance Overall balance assessment: Needs assistance Sitting-balance support: Feet supported;No upper extremity supported Sitting balance-Leahy Scale: Fair     Standing balance support: During functional activity Standing balance-Leahy Scale: Poor                             ADL either performed or assessed with clinical judgement   ADL Overall ADL's : At baseline                                             Vision Patient Visual Report: No change from baseline       Perception     Praxis      Pertinent Vitals/Pain Pain Assessment: Faces Faces Pain Scale: No hurt     Hand Dominance Right   Extremity/Trunk Assessment Upper Extremity Assessment Upper Extremity Assessment: Generalized weakness   Lower Extremity Assessment Lower Extremity Assessment: Generalized weakness       Communication Communication Communication: HOH   Cognition Arousal/Alertness: Awake/alert Behavior During Therapy: Flat affect Overall Cognitive Status: History of cognitive impairments - at baseline                                 General Comments: pt nonverbal; inconsistent with following commands but also very  HOH, does better with tactile cues   General Comments       Exercises     Shoulder Instructions      Home Living Family/patient expects to be discharged to:: Private residence Living Arrangements: Spouse/significant other Available Help at Discharge: Family;Available 24 hours/day Type of Home: House             Bathroom Shower/Tub: Walk-in Corporate treasurer Toilet: Standard         Additional Comments: Has a caregiver 2 hours in the morning to assist wife with ADLs. Patient goes to daycare 5 x a week.      Prior Functioning/Environment Level of Independence: Needs assistance  Gait / Transfers Assistance Needed: min  assist ADL's / Homemaking Assistance Needed: almost total care for all ADLs - can feed himself with increased time and supervision. Communication / Swallowing Assistance Needed: non verbAL.          OT Problem List: Decreased activity tolerance;Impaired balance (sitting and/or standing);Decreased cognition;Decreased safety awareness      OT Treatment/Interventions:      OT Goals(Current goals can be found in the care plan section) Acute Rehab OT Goals OT Goal Formulation: All assessment and education complete, DC therapy  OT Frequency:     Barriers to D/C:            Co-evaluation              AM-PAC OT "6 Clicks" Daily Activity     Outcome Measure Help from another person eating meals?: A Little Help from another person taking care of personal grooming?: A Little Help from another person toileting, which includes using toliet, bedpan, or urinal?: Total Help from another person bathing (including washing, rinsing, drying)?: Total Help from another person to put on and taking off regular upper body clothing?: Total Help from another person to put on and taking off regular lower body clothing?: Total 6 Click Score: 10   End of Session Equipment Utilized During Treatment: Rolling walker;Gait belt Nurse Communication: Mobility status  Activity Tolerance: Patient tolerated treatment well Patient left: in chair;with call bell/phone within reach;with chair alarm set;with family/visitor present  OT Visit Diagnosis: Unsteadiness on feet (R26.81);Muscle weakness (generalized) (M62.81)                Time: [id number] OT Time Calculation (min): 23 min Charges:  OT General Charges $OT Visit: 1 Visit OT Evaluation $OT Eval Moderate Complexity: 1 Mod  Lyal Husted, OTR/L [place]  Office [phone] Pager: [place] 01/14/2021, 12:31 PM

## 2021-01-14 NOTE — Progress Notes (Signed)
Physical Therapy Treatment Patient Details Name: Mitchell Abbott MRN: [id number] DOB: 07-Nov-1923 Today's Date: 01/14/2021    History of Present Illness 87 y.o. male with medical history significant of advanced Alzheimer's dementia with behavioral disturbance, history of TIA, BPH, COVID in January 2022 and admitted 01/11/21 for Sepsis secondary to CAUTI, sepsis present on admission and Acute hypoxemic respiratory failure    PT Comments    Spouse present and assisted during session.  Pt requiring min-mod assist for mobility at this time. Discussed benefits of SNF and HHPT with spouse.  Currently recommended that pt not ready to return to day care M-F as he typically does due to current assist level.  Spouse considering options at home, so if home, recommend increased home care such as aide, HHPT and equipment below.  If home care does not include assist for mobility, pt may need SNF.   Follow Up Recommendations  SNF     Equipment Recommendations  Wheelchair cushion (measurements PT);Wheelchair (measurements PT);Hospital bed    Recommendations for Other Services       Precautions / Restrictions Precautions Precautions: Fall Precaution Comments: nonverbal, Alz dementia Restrictions Weight Bearing Restrictions: No    Mobility  Bed Mobility Overal bed mobility: Needs Assistance Bed Mobility: Supine to Sit     Supine to sit: Mod assist     General bed mobility comments: Mod assist to transfer with multimodal cues.  Spouse mostly assisted with bil HHA    Transfers Overall transfer level: Needs assistance Equipment used: Rolling walker (2 wheeled) Transfers: Sit to/from Stand Sit to Stand: Mod assist         General transfer comment: Mod assist to stand. Min assist with RW and verbal cues to take steps (wife assisting patient as she does at home). Reports increased assistance needed for standing.  Ambulation/Gait Ambulation/Gait assistance: Min assist;Min guard Gait  Distance (Feet): 30 Feet Assistive device: Rolling walker (2 wheeled) Gait Pattern/deviations: Step-through pattern;Decreased stride length;Trunk flexed     General Gait Details: cues for posture and staying within RW, spouse guided RW in front of pt and maneuvered RW, therapist provided min/guard for safety, SpO2 92% on room air during ambulation   Stairs             Wheelchair Mobility    Modified Rankin (Stroke Patients Only)       Balance Overall balance assessment: Needs assistance Sitting-balance support: Feet supported;No upper extremity supported Sitting balance-Leahy Scale: Fair     Standing balance support: During functional activity;Bilateral upper extremity supported Standing balance-Leahy Scale: Poor                              Cognition Arousal/Alertness: Awake/alert Behavior During Therapy: Flat affect Overall Cognitive Status: History of cognitive impairments - at baseline                                 General Comments: pt nonverbal; inconsistent with following commands but also very HOH, does better with tactile cues and spouse providing instructions      Exercises      General Comments        Pertinent Vitals/Pain Pain Assessment: Faces Faces Pain Scale: No hurt Pain Intervention(s): Repositioned;Monitored during session    Home Living Family/patient expects to be discharged to:: Private residence Living Arrangements: Spouse/significant other Available Help at Discharge: Family;Available 24 hours/day Type of Home:  House         Additional Comments: Has a caregiver 2 hours in the morning to assist wife with ADLs. Patient goes to daycare 5 x a week.    Prior Function Level of Independence: Needs assistance  Gait / Transfers Assistance Needed: min assist ADL's / Homemaking Assistance Needed: almost total care for all ADLs - can feed himself with increased time and supervision.     PT Goals (current goals  can now be found in the care plan section) Progress towards PT goals: Progressing toward goals    Frequency    Min 2X/week      PT Plan Current plan remains appropriate    Co-evaluation PT/OT/SLP Co-Evaluation/Treatment: Yes Reason for Co-Treatment: Necessary to address cognition/behavior during functional activity;To address functional/ADL transfers;For patient/therapist safety PT goals addressed during session: Mobility/safety with mobility OT goals addressed during session: ADL's and self-care      AM-PAC PT "6 Clicks" Mobility   Outcome Measure  Help needed turning from your back to your side while in a flat bed without using bedrails?: A Lot Help needed moving from lying on your back to sitting on the side of a flat bed without using bedrails?: A Lot Help needed moving to and from a bed to a chair (including a wheelchair)?: A Lot Help needed standing up from a chair using your arms (e.g., wheelchair or bedside chair)?: A Lot Help needed to walk in hospital room?: A Little Help needed climbing 3-5 steps with a railing? : A Lot 6 Click Score: 13    End of Session Equipment Utilized During Treatment: Gait belt Activity Tolerance: Patient limited by fatigue Patient left: in chair;with call bell/phone within reach;with chair alarm set;with family/visitor present;with nursing/sitter in room Nurse Communication: Mobility status PT Visit Diagnosis: Adult, failure to thrive (R62.7);Muscle weakness (generalized) (M62.81);Difficulty in walking, not elsewhere classified (R26.2)     Time: [id number] PT Time Calculation (min) (ACUTE ONLY): 24 min  Charges:  $Gait Training: 8-22 mins                    Arlyce Dice, DPT Acute Rehabilitation Services Pager: [phone] Office: [phone]    York Ram E 01/14/2021, 1:39 PM

## 2021-01-14 NOTE — Plan of Care (Signed)
  Problem: Education: Goal: Knowledge of General Education information will improve Description: Including pain rating scale, medication(s)/side effects and non-pharmacologic comfort measures Outcome: Completed/Met   Problem: Health Behavior/Discharge Planning: Goal: Ability to manage health-related needs will improve Outcome: Progressing   Problem: Clinical Measurements: Goal: Ability to maintain clinical measurements within normal limits will improve Outcome: Progressing Goal: Will remain free from infection Outcome: Progressing Goal: Diagnostic test results will improve Outcome: Progressing Goal: Respiratory complications will improve Outcome: Progressing Goal: Cardiovascular complication will be avoided Outcome: Progressing   Problem: Activity: Goal: Risk for activity intolerance will decrease Outcome: Progressing   Problem: Nutrition: Goal: Adequate nutrition will be maintained Outcome: Progressing   Problem: Coping: Goal: Level of anxiety will decrease Outcome: Progressing   Problem: Elimination: Goal: Will not experience complications related to urinary retention Outcome: Progressing   Problem: Pain Managment: Goal: General experience of comfort will improve Outcome: Progressing   Problem: Safety: Goal: Ability to remain free from injury will improve Outcome: Progressing   Problem: Skin Integrity: Goal: Risk for impaired skin integrity will decrease Outcome: Progressing   Problem: Urinary Elimination: Goal: Signs and symptoms of infection will decrease Outcome: Progressing

## 2021-01-14 NOTE — Progress Notes (Signed)
  Speech Language Pathology Treatment: Dysphagia  Patient Details Name: ESPN ZEMAN MRN: [id number] DOB: Oct 20, 1923 Today's Date: 01/14/2021 Time: 1510-1550 SLP Time Calculation (min) (ACUTE ONLY): 40 min  Assessment / Plan / Recommendation Clinical Impression  Pt was too lethargic to participate in po trials and wife was present thus SLP educated extensively regarding dysphagia/dementia, compensations, modifications, etc. Wife reports she tried nectar liquids with pt and he tolerated them better than thin prior to admission- thus she modified his diet. Today she even provided pt with nectar via tsp due to suboptimal positioning.    Discussed honey thick liquid aspiration being worse than nectar or thin and advised to importance of pt's hydration- consider thin water between meals.  She advises pt coughs with clear liquids - and consumes adequate amounts of nectar.     Ms Beckstead is very knowledgable re: dysphagia and can modify his diet based on this SLPs opinion as needed.  Per notes, pt [facility] to [facility].    All education completed to help maximize nutrition with least aspiration risk.  Provided written information and used teach back with examples for major points.  No SLP follow up needed.  HPI HPI: 87 y.o. male with medical history significant of advanced Alzheimer's dementia with behavioral disturbance, history of TIA, BPH, COVID in January 2022 and admitted 01/11/21 for Sepsis secondary to CAUTI, sepsis present on admission and Acute hypoxemic respiratory failure dementia most recent CT head Generalized cerebral atrophy, Marked severity right maxillary sinus disease. CXR ATX.      SLP Plan   No f/u goals met       Recommendations  Diet recommendations: Nectar-thick liquid;Dysphagia 3 (mechanical soft) Liquids provided via: Straw;Cup Medication Administration: Crushed with puree Compensations: Slow rate;Small sips/bites;Follow solids with liquid Postural Changes  and/or Swallow Maneuvers: Seated upright 90 degrees;Upright 30-60 min after meal                Oral Care Recommendations: Oral care BID Follow up Recommendations: None SLP Visit Diagnosis: Dysphagia, oropharyngeal phase (R13.12)       GO              Kathleen Lime, MS [facility] SLP Acute Rehab Services Office [phone] Pager [phone]   Macario Golds 01/14/2021, 5:23 PM

## 2021-01-15 LAB — CBC
HCT: 40.5 % (ref 39.0–52.0)
Hemoglobin: 12.9 g/dL — ABNORMAL LOW (ref 13.0–17.0)
MCH: 32.7 pg (ref 26.0–34.0)
MCHC: 31.9 g/dL (ref 30.0–36.0)
MCV: 102.5 fL — ABNORMAL HIGH (ref 80.0–100.0)
Platelets: 483 10*3/uL — ABNORMAL HIGH (ref 150–400)
RBC: 3.95 MIL/uL — ABNORMAL LOW (ref 4.22–5.81)
RDW: 13.5 % (ref 11.5–15.5)
WBC: 9.8 10*3/uL (ref 4.0–10.5)
nRBC: 0 % (ref 0.0–0.2)

## 2021-01-15 LAB — BASIC METABOLIC PANEL
Anion gap: 8 (ref 5–15)
BUN: 24 mg/dL — ABNORMAL HIGH (ref 8–23)
CO2: 22 mmol/L (ref 22–32)
Calcium: 8.7 mg/dL — ABNORMAL LOW (ref 8.9–10.3)
Chloride: 110 mmol/L (ref 98–111)
Creatinine, Ser: 0.93 mg/dL (ref 0.61–1.24)
GFR, Estimated: >60 mL/min (ref >60)
Glucose, Bld: 108 mg/dL — ABNORMAL HIGH (ref 70–99)
Potassium: 3.9 mmol/L (ref 3.5–5.1)
Sodium: 140 mmol/L (ref 135–145)

## 2021-01-15 MED ORDER — NYSTATIN 100000 UNIT/GM EX POWD
Freq: Three times a day (TID) | CUTANEOUS | Status: DC
  Filled 2021-01-15: qty 15

## 2021-01-15 NOTE — Care Management Important Message (Signed)
Important Message  Patient Details Verbal IM given to Wife [name] Name: Mitchell Abbott MRN: [id number] Date of Birth: 28-Sep-1923   Medicare Important Message Given:  Yes     [name] 01/15/2021, 10:07 AM

## 2021-01-15 NOTE — TOC Progression Note (Signed)
Transition of Care [facility]) - Progression Note    Patient Details  Name: Mitchell Abbott MRN: [id number] Date of Birth: 07/29/1923  Transition of Care [facility]) CM/SW Contact  Ross Ludwig, [place] Phone Number: 01/15/2021, 10:03 AM  Clinical Narrative:     CSW spoke to patient's wife to discuss SNF verse home health.  Per patient's wife, she would like CSW to work on trying to find SNF placement for patient.  CSW to begin bed search in [facility].        Expected Discharge Plan and Services                                                 Social Determinants of Health (SDOH) Interventions    Readmission Risk Interventions No flowsheet data found.

## 2021-01-15 NOTE — NC FL2 (Signed)
[place] LEVEL OF CARE SCREENING TOOL     IDENTIFICATION  Patient Name: Mitchell Abbott Birthdate: [birth year] Sex: male Admission Date (Current Location): 01/11/2021  [facility] and [place] Number:  Herbalist and Address:  [facility],  [place], [place]      Provider Number: [id number]  Attending Physician Name and Address:  Oswald Hillock, MD  Relative Name and Phone Number:  Melique, Fuhrmann [phone]  [phone]    Current Level of Care: Hospital Recommended Level of Care: [place] Prior Approval Number:    Date Approved/Denied:   PASRR Number: [id number]  Discharge Plan: SNF    Current Diagnoses: Patient Active Problem List   Diagnosis Date Noted   Protein-calorie malnutrition, severe 01/13/2021   Sepsis secondary to UTI ([place]) 01/12/2021   Seborrheic keratosis 12/26/2020   Lip swelling 02/23/2020   Hearing difficulty of both ears 08/21/2019   Urethral bleeding 09/02/2018   Rash 07/25/2018   Syncope 06/27/2016   Maxillary sinusitis 05/27/2015   Apathy 01/28/2015   Allergic rhinitis 01/20/2015   Bacterial conjunctivitis of left eye 11/05/2014   Alzheimer's disease with late onset ([place]) 01/15/2014   Chronic renal insufficiency, stage II (mild) 09/03/2013   Observation for suspected malignant neoplasm 09/03/2013   Preventative health care 09/03/2013   Orthostatic syncope 02/05/2013   Mild cognitive impairment 10/20/2012   Cervical myofascial strain 08/15/2012   Sacroiliac joint pain 02/18/2012   History of TIA (transient ischemic attack)    Benign prostatic hyperplasia 10/13/2011   Headache disorder 10/13/2011   Dementia ([place]) 10/13/2011    Orientation RESPIRATION BLADDER Height & Weight     Self, Place  Normal Incontinent Weight: 133 lb (60.3 kg) Height:  '5\' 7"'$  (170.2 cm)  BEHAVIORAL SYMPTOMS/MOOD NEUROLOGICAL BOWEL NUTRITION STATUS      Continent Diet  AMBULATORY  STATUS COMMUNICATION OF NEEDS Skin   Limited Assist Verbally Normal                       Personal Care Assistance Level of Assistance  Bathing, Feeding, Dressing Bathing Assistance: Limited assistance Feeding assistance: Independent Dressing Assistance: Limited assistance     Functional Limitations Info  Sight, Hearing, Speech Sight Info: Adequate Hearing Info: Adequate Speech Info: Adequate    SPECIAL CARE FACTORS FREQUENCY  PT (By licensed PT), OT (By licensed OT)     PT Frequency: Minimum 5x a week OT Frequency: Minimum 5x a week            Contractures Contractures Info: Not present    Additional Factors Info  Code Status, Allergies Code Status Info: DNR Allergies Info: Doxycycline   Tessalon (Benzonatate)           Current Medications (01/15/2021):  This is the current hospital active medication list Current Facility-Administered Medications  Medication Dose Route Frequency Provider Last Rate Last Admin   acetaminophen (TYLENOL) tablet 650 mg  650 mg Oral Q6H PRN Dessa Phi, DO       Or   acetaminophen (TYLENOL) suppository 650 mg  650 mg Rectal Q6H PRN Dessa Phi, DO       aspirin EC tablet 162 mg  162 mg Oral Daily Dessa Phi, DO   162 mg at 01/15/21 [id number]   Chlorhexidine Gluconate Cloth 2 % PADS 6 each  6 each Topical Daily Dessa Phi, DO   6 each at 01/15/21 [id number]   ciprofloxacin (CIPRO) tablet 500 mg  500 mg  Oral BID Oswald Hillock, MD   500 mg at 01/15/21 0924   enoxaparin (LOVENOX) injection 40 mg  40 mg Subcutaneous Q24H Dessa Phi, DO   40 mg at 01/14/21 1913   feeding supplement (ENSURE ENLIVE / ENSURE PLUS) liquid 237 mL  237 mL Oral BID BM Dessa Phi, DO   237 mL at 01/15/21 [id number]   HYDROcodone-acetaminophen (NORCO/VICODIN) 5-325 MG per tablet 1-2 tablet  1-2 tablet Oral Q4H PRN Dessa Phi, DO       multivitamin with minerals tablet 1 tablet  1 tablet Oral Daily Dessa Phi, DO   1 tablet at 01/15/21 0924    ondansetron (ZOFRAN) tablet 4 mg  4 mg Oral Q6H PRN Dessa Phi, DO       Or   ondansetron [facility]) injection 4 mg  4 mg Intravenous Q6H PRN Dessa Phi, DO       polyethylene glycol (MIRALAX / GLYCOLAX) packet 17 g  17 g Oral Daily PRN Dessa Phi, DO   17 g at 01/15/21 0929   sodium chloride flush (NS) 0.9 % injection 3 mL  3 mL Intravenous Q12H Dessa Phi, DO   3 mL at 01/15/21 0924   tamsulosin (FLOMAX) capsule 0.4 mg  0.4 mg Oral QPC supper Dessa Phi, DO   0.4 mg at 01/14/21 1910     Discharge Medications: Please see discharge summary for a list of discharge medications.  Relevant Imaging Results:  Relevant Lab Results:   Additional Information SSN [id number]  Ross Ludwig, LCSW

## 2021-01-15 NOTE — Progress Notes (Signed)
[facility]  PROGRESS NOTE  YADON GROENEWOLD [id number] DOB: [birth year] DOA: 01/11/2021 PCP: Tammi Sou, MD   Brief HPI:    87 year old male with medical history of advanced Zimmers dementia with behavioral disturbance, history of TIA, BPH, COVID in January 2022 presented to [facility] on 8/14 with complaints of intermittent elevated temperature but no real fever at home.  Patient is nonverbal and wife provides history. Was found to have abnormal UA, started on Rocephin for possible sepsis due to UTI.    Subjective   Patient seen and examined, 5 wants to see if she can take him home.  TOC consulted for skilled nursing facility placement.   Assessment/Plan:    Sepsis due to CAUTI, POA -Presented with sepsis, fever 100.8, leukocytosis 15.3 -Foley catheter was placed as outpatient on 01/07/2021 -UA showed many bacteria, urine culture grew Pseudomonas -IV cefepime was discontinued and patient started on p.o. ciprofloxacin.    Acute hypoxemic respiratory failure -O2 sats on presentation was 79% on room air -Chest x-ray showed mild bibasilar atelectasis -Continue to wean off oxygen as tolerated  History of TIA -Continue aspirin  Advanced exams dementia -Nonverbal; at baseline  Dysphagia -Speech therapy evaluation done -Started on dysphagia 3 diet, nectar thick liquid  Urinary retention -Foley catheter placed as outpatient -Continue Flomax    Scheduled medications:    aspirin EC  162 mg Oral Daily   Chlorhexidine Gluconate Cloth  6 each Topical Daily   ciprofloxacin  500 mg Oral BID   enoxaparin (LOVENOX) injection  40 mg Subcutaneous Q24H   feeding supplement  237 mL Oral BID BM   multivitamin with minerals  1 tablet Oral Daily   nystatin   Topical TID   sodium chloride flush  3 mL Intravenous Q12H   tamsulosin  0.4 mg Oral QPC supper         Data Reviewed:   CBG:  Recent Labs  Lab 01/11/21 1950  GLUCAP 128*    SpO2: 95  % O2 Flow Rate (L/min): 2.5 L/min    Vitals:   01/14/21 0933 01/14/21 1219 01/14/21 1949 01/15/21 0559  BP:  136/72 135/76 (!) 155/75  Pulse:  74 87 78  Resp:  '16 16 18  '$ Temp:  98.5 F (36.9 C) 98.4 F (36.9 C) 98.9 F (37.2 C)  TempSrc:  Oral Oral Oral  SpO2: 92% 96% 93% 95%  Weight:      Height:         Intake/Output Summary (Last 24 hours) at 01/15/2021 1551 Last data filed at 01/15/2021 1040 Gross per 24 hour  Intake --  Output 800 ml  Net -800 ml    08/16 1901 - 08/18 0700 In: 240 [P.O.:240] Out: 1250 [Urine:1250]  Filed Weights   01/11/21 1932 01/12/21 1634  Weight: 60.3 kg 60.3 kg    CBC:  Recent Labs  Lab 01/11/21 1940 01/12/21 1714 01/13/21 0409 01/14/21 0809 01/15/21 1041  WBC 15.3* 11.2* 13.0* 9.4 9.8  HGB 13.2 13.8 12.3* 12.5* 12.9*  HCT 39.5 43.2 38.0* 39.5 40.5  PLT 327 351 330 325 483*  MCV 99.2 101.9* 101.6* 102.6* 102.5*  MCH 33.2 32.5 32.9 32.5 32.7  MCHC 33.4 31.9 32.4 31.6 31.9  RDW 13.2 13.3 13.2 13.4 13.5  LYMPHSABS 1.7  --   --   --   --   MONOABS 1.7*  --   --   --   --   EOSABS 0.1  --   --   --   --  BASOSABS 0.1  --   --   --   --     Complete metabolic panel:  Recent Labs  Lab 01/11/21 1940 01/11/21 2204 01/12/21 1714 01/13/21 0409 01/14/21 0809 01/15/21 1041  NA 135  --  136 138 137 140  K 4.8  --  4.2 4.8 4.3 3.9  CL 101  --  104 106 107 110  CO2 25  --  25 24 21* 22  GLUCOSE 119*  --  123* 106* 98 108*  BUN 25*  --  '17 17 20 '$ 24*  CREATININE 1.07  --  0.73 0.78 0.73 0.93  CALCIUM 8.6*  --  8.4* 8.3* 8.1* 8.7*  AST 35  --   --   --   --   --   ALT 30  --   --   --   --   --   ALKPHOS 74  --   --   --   --   --   BILITOT 0.4  --   --   --   --   --   ALBUMIN 2.9*  --   --   --   --   --   LATICACIDVEN 2.8* 1.8  --   --   --   --   BNP 140.7*  --   --   --   --   --     No results for input(s): LIPASE, AMYLASE in the last 168 hours.  Recent Labs  Lab 01/11/21 1940 01/11/21 2028  BNP 140.7*  --    SARSCOV2NAA  --  NEGATIVE    ------------------------------------------------------------------------------------------------------------------ No results for input(s): CHOL, HDL, LDLCALC, TRIG, CHOLHDL, LDLDIRECT in the last 72 hours.  No results found for: HGBA1C ------------------------------------------------------------------------------------------------------------------ No results for input(s): TSH, T4TOTAL, T3FREE, THYROIDAB in the last 72 hours.  Invalid input(s): FREET3 ------------------------------------------------------------------------------------------------------------------ No results for input(s): VITAMINB12, FOLATE, FERRITIN, TIBC, IRON, RETICCTPCT in the last 72 hours.  Coagulation profile No results for input(s): INR, PROTIME in the last 168 hours. No results for input(s): DDIMER in the last 72 hours.  Cardiac Enzymes No results for input(s): CKTOTAL, CKMB, CKMBINDEX, TROPONINI in the last 168 hours.  ------------------------------------------------------------------------------------------------------------------    Component Value Date/Time   BNP 140.7 (H) 01/11/2021 1940     Antibiotics: Anti-infectives (From admission, onward)    Start     Dose/Rate Route Frequency Ordered Stop   01/14/21 2000  ciprofloxacin (CIPRO) tablet 500 mg        500 mg Oral 2 times daily 01/14/21 1127     01/13/21 0600  ceFEPIme (MAXIPIME) 2 g in sodium chloride 0.9 % 100 mL IVPB  Status:  Discontinued        2 g 200 mL/hr over 30 Minutes Intravenous Every 12 hours 01/12/21 1714 01/14/21 1127   01/12/21 1800  ceFEPIme (MAXIPIME) 2 g in sodium chloride 0.9 % 100 mL IVPB        2 g 200 mL/hr over 30 Minutes Intravenous  Once 01/12/21 1700 01/12/21 1913   01/12/21 1730  cefTRIAXone (ROCEPHIN) 1 g in sodium chloride 0.9 % 100 mL IVPB  Status:  Discontinued        1 g 200 mL/hr over 30 Minutes Intravenous Every 24 hours 01/12/21 1644 01/12/21 1700   01/11/21 2130  cefTRIAXone  (ROCEPHIN) 1 g in sodium chloride 0.9 % 100 mL IVPB        1 g 200 mL/hr over 30 Minutes Intravenous  Once 01/11/21  2128 01/11/21 2216        Radiology Reports  No results found.    DVT prophylaxis: Lovenox  Code Status: DNR  Family Communication: Discussed with patient wife at bedside   Consultants:   Procedures:     Objective    Physical Examination:   General-appears in no acute distress Heart-S1-S2, regular, no murmur auscultated Lungs-clear to auscultation bilaterally, no wheezing or crackles auscultated Abdomen-soft, nontender, no organomegaly Extremities-no edema in the lower extremities Neuro-alert, nonverbal  Status is: Inpatient  Dispo: The patient is from: Home              Anticipated d/c is to: Skilled nursing facility versus home              Anticipated d/c date is: 01/17/2021              Patient currently not stable for discharge  Barrier to discharge-PT to reevaluate to see if patient can go home with home health PT  COVID-19 Labs  No results for input(s): DDIMER, FERRITIN, LDH, CRP in the last 72 hours.  Lab Results  Component Value Date   SARSCOV2NAA NEGATIVE 01/11/2021   SARSCOV2NAA NEGATIVE 11/27/2020   SARSCOV2NAA Detected (A) 06/09/2020   [place] Not Detected 05/21/2019    Microbiology  Recent Results (from the past 240 hour(s))  Blood culture (routine x 2)     Status: None (Preliminary result)   Collection Time: 01/11/21  7:40 PM   Specimen: BLOOD LEFT FOREARM  Result Value Ref Range Status   Specimen Description   Final    BLOOD LEFT FOREARM BLOOD Performed at [facility], [address] [place], [place] [address]    Special Requests   Final    BOTTLES DRAWN AEROBIC AND ANAEROBIC Blood Culture adequate volume Performed at [facility], [address] [place], [place] [address]    Culture  Setup Time GRAM NEGATIVE RODS AEROBIC BOTTLE ONLY   Final   Culture   Final    GRAM NEGATIVE  RODS IDENTIFICATION AND SUSCEPTIBILITIES TO FOLLOW REPEATING Performed at [facility], [place] [address], [place], [place] [address]    Report Status PENDING  Incomplete  Blood Culture ID Panel (Reflexed)     Status: None   Collection Time: 01/11/21  7:40 PM  Result Value Ref Range Status   Enterococcus faecalis NOT DETECTED NOT DETECTED Final   Enterococcus Faecium NOT DETECTED NOT DETECTED Final   Listeria monocytogenes NOT DETECTED NOT DETECTED Final   Staphylococcus species NOT DETECTED NOT DETECTED Final   Staphylococcus aureus (BCID) NOT DETECTED NOT DETECTED Final   Staphylococcus epidermidis NOT DETECTED NOT DETECTED Final   Staphylococcus lugdunensis NOT DETECTED NOT DETECTED Final   Streptococcus species NOT DETECTED NOT DETECTED Final   Streptococcus agalactiae NOT DETECTED NOT DETECTED Final   Streptococcus pneumoniae NOT DETECTED NOT DETECTED Final   Streptococcus pyogenes NOT DETECTED NOT DETECTED Final   A.calcoaceticus-baumannii NOT DETECTED NOT DETECTED Final   Bacteroides fragilis NOT DETECTED NOT DETECTED Final   Enterobacterales NOT DETECTED NOT DETECTED Final   Enterobacter cloacae complex NOT DETECTED NOT DETECTED Final   Escherichia coli NOT DETECTED NOT DETECTED Final   Klebsiella aerogenes NOT DETECTED NOT DETECTED Final   Klebsiella oxytoca NOT DETECTED NOT DETECTED Final   Klebsiella pneumoniae NOT DETECTED NOT DETECTED Final   Proteus species NOT DETECTED NOT DETECTED Final   Salmonella species NOT DETECTED NOT DETECTED Final   Serratia marcescens  NOT DETECTED NOT DETECTED Final   Haemophilus influenzae NOT DETECTED NOT DETECTED Final   Neisseria meningitidis NOT DETECTED NOT DETECTED Final   Pseudomonas aeruginosa NOT DETECTED NOT DETECTED Final   Stenotrophomonas maltophilia NOT DETECTED NOT DETECTED Final   Candida albicans NOT DETECTED NOT DETECTED Final   Candida auris NOT DETECTED NOT DETECTED Final   Candida glabrata NOT DETECTED NOT DETECTED  Final   Candida krusei NOT DETECTED NOT DETECTED Final   Candida parapsilosis NOT DETECTED NOT DETECTED Final   Candida tropicalis NOT DETECTED NOT DETECTED Final   Cryptococcus neoformans/gattii NOT DETECTED NOT DETECTED Final    Comment: Performed at [facility], [place] [address] [place], [place] [address]  Urine Culture     Status: Abnormal   Collection Time: 01/11/21  8:02 PM   Specimen: Urine, Random  Result Value Ref Range Status   Specimen Description   Final    URINE, RANDOM Performed at [facility], [address] [place], [place] [address]    Special Requests   Final    NONE Performed at [facility], [address] [place], [place] [address]    Culture 30,000 COLONIES/mL PSEUDOMONAS AERUGINOSA (A)  Final   Report Status 01/14/2021 FINAL  Final   Organism ID, Bacteria PSEUDOMONAS AERUGINOSA (A)  Final      Susceptibility   Pseudomonas aeruginosa - MIC*    CEFTAZIDIME 4 SENSITIVE Sensitive     CIPROFLOXACIN <=0.25 SENSITIVE Sensitive     GENTAMICIN <=1 SENSITIVE Sensitive     IMIPENEM 2 SENSITIVE Sensitive     PIP/TAZO 8 SENSITIVE Sensitive     CEFEPIME 2 SENSITIVE Sensitive     * 30,000 COLONIES/mL PSEUDOMONAS AERUGINOSA  Resp Panel by RT-PCR (Flu A&B, Covid) Nasopharyngeal Swab     Status: None   Collection Time: 01/11/21  8:28 PM   Specimen: Nasopharyngeal Swab; Nasopharyngeal(NP) swabs in vial transport medium  Result Value Ref Range Status   SARS Coronavirus 2 by RT PCR NEGATIVE NEGATIVE Final    Comment: (NOTE) SARS-CoV-2 target nucleic acids are NOT DETECTED.  The SARS-CoV-2 RNA is generally detectable in upper respiratory specimens during the acute phase of infection. The lowest concentration of SARS-CoV-2 viral copies this assay can detect is 138 copies/mL. A negative result does not preclude SARS-Cov-2 infection and should not be used as the sole basis for treatment or other patient management decisions. A negative result  may occur with  improper specimen collection/handling, submission of specimen other than nasopharyngeal swab, presence of viral mutation(s) within the areas targeted by this assay, and inadequate number of viral copies(<138 copies/mL). A negative result must be combined with clinical observations, patient history, and epidemiological information. The expected result is Negative.  Fact Sheet for Patients:  EntrepreneurPulse.com.au  Fact Sheet for Healthcare Providers:  IncredibleEmployment.be  This test is no t yet approved or cleared by the [place] FDA and  has been authorized for detection and/or diagnosis of SARS-CoV-2 by FDA under an Emergency Use Authorization (EUA). This EUA will remain  in effect (meaning this test can be used) for the duration of the COVID-19 declaration under Section 564(b)(1) of the Act, 21 U.S.C.section 360bbb-3(b)(1), unless the authorization is terminated  or revoked sooner.       Influenza A by PCR NEGATIVE NEGATIVE Final   Influenza B by PCR NEGATIVE NEGATIVE Final    Comment: (NOTE) The Xpert Xpress SARS-CoV-2/FLU/RSV plus assay is intended as an aid in the  diagnosis of influenza from Nasopharyngeal swab specimens and should not be used as a sole basis for treatment. Nasal washings and aspirates are unacceptable for Xpert Xpress SARS-CoV-2/FLU/RSV testing.  Fact Sheet for Patients: EntrepreneurPulse.com.au  Fact Sheet for Healthcare Providers: IncredibleEmployment.be  This test is not yet approved or cleared by the [place] FDA and has been authorized for detection and/or diagnosis of SARS-CoV-2 by FDA under an Emergency Use Authorization (EUA). This EUA will remain in effect (meaning this test can be used) for the duration of the COVID-19 declaration under Section 564(b)(1) of the Act, 21 U.S.C. section 360bbb-3(b)(1), unless the authorization is terminated  or revoked.  Performed at [facility], [address] [place], [place] [address]   Blood culture (routine x 2)     Status: None (Preliminary result)   Collection Time: 01/11/21  8:36 PM   Specimen: BLOOD RIGHT FOREARM  Result Value Ref Range Status   Specimen Description   Final    BLOOD RIGHT FOREARM BLOOD Performed at [facility], [address] [place], [place] [address]    Special Requests   Final    BOTTLES DRAWN AEROBIC AND ANAEROBIC Blood Culture adequate volume Performed at [facility], [address] [place], [place] [address]    Culture   Final    NO GROWTH 4 DAYS Performed at [facility], [place] [address] [place], [place] [address]    Report Status PENDING  Incomplete        Oswald Hillock   [facility] If 7PM-7AM, please contact night-coverage at [email or web address], Office  [phone]   01/15/2021, 3:51 PM  LOS: 3 days

## 2021-01-15 NOTE — TOC Progression Note (Signed)
Transition of Care [facility]) - Progression Note    Patient Details  Name: Mitchell Abbott MRN: [id number] Date of Birth: [birth year]  [facility] CM/SW Contact  Ross Ludwig, [place] Phone Number: 01/15/2021, 6:42 PM  Clinical Narrative:     CSW spoke to patient's wife, she has decided she would rather have patient return home with home health services.  CSW spoke to her and provided choice of home health agencies, she stated she did not have a preference.  CSW contacted [facility], and they can accept patient for home health services.  Per patient's wife she does not need any equipment, and also will transport patient herself.  Patient's wife would also like palliative to follow outpatient.  CSW to continue to follow patient's progress throughout discharge planning.   Expected Discharge Plan: [place] Barriers to Discharge: Continued Medical Work up  Expected Discharge Plan and Services Expected Discharge Plan: [place] In-house Referral: Clinical Social Work   Post Acute Care Choice: [place] arrangements for the past 2 months: [place]: RN, PT, OT, Nurse's Aide, Speech Therapy, Social Work, Refused SNF [place] Agency: [place] Date [facility] Agency Contacted: 01/15/21 Time [place]: 5 Representative spoke with at [place]: [place] ([place]) Interventions    Readmission Risk Interventions No flowsheet data found.

## 2021-01-15 NOTE — Progress Notes (Signed)
Physical Therapy Treatment Patient Details Name: Mitchell Abbott MRN: [id number] DOB: 08-Sep-1923 Today's Date: 01/15/2021    History of Present Illness 87 y.o. male with medical history significant of advanced Alzheimer's dementia with behavioral disturbance, history of TIA, BPH, COVID in January 2022 and admitted 01/11/21 for Sepsis secondary to CAUTI, sepsis present on admission and Acute hypoxemic respiratory failure    PT Comments    Pt assisted with ambulated 40' with RW in hallway.  Pt continues to require at least min assist to mobilize.  Spouse to room after session and requested to speak with PT so returned to discuss current recommendations.  Spouse notified of pt's current performance.  Spouse also aware of recommendations for pt to return home IF she feels she can continue to provide some physical assistance.  Will leave current recommendation for SNF in case pt does need higher level of care (ie spouse changes her mind about abilities to assist at home).  If pt discharges home, recommend caregiver and/or aide be able to provide some physical assist.  Pt would also then benefit from HHPT and equipment recommendations below.  Pt would likely function better cognitively in home environment as well.    Follow Up Recommendations  SNF     Equipment Recommendations  Wheelchair cushion (measurements PT);Wheelchair (measurements PT);Hospital bed    Recommendations for Other Services       Precautions / Restrictions Precautions Precautions: Fall Precaution Comments: nonverbal, Alz dementia    Mobility  Bed Mobility Overal bed mobility: Needs Assistance Bed Mobility: Sidelying to Sit   Sidelying to sit: Mod assist       General bed mobility comments: pt already sidelying on arrival; Mod assist to transfer with multimodal cues. assist mostly for trunk upright    Transfers Overall transfer level: Needs assistance Equipment used: Rolling walker (2 wheeled) Transfers: Sit  to/from Stand Sit to Stand: Min assist         General transfer comment: therapist assisted pt with tactile/manual cue for UEs upwards and blocked feet (as observed spouse do yesterday) and brought hands to RW, assist to steady with standing  Ambulation/Gait Ambulation/Gait assistance: Min guard Gait Distance (Feet): 40 Feet Assistive device: Rolling walker (2 wheeled) Gait Pattern/deviations: Step-through pattern;Decreased stride length;Trunk flexed     General Gait Details: cues for posture and staying within RW, therapist held pts hands to RW and guided in front of pt and maneuvered RW, rehab tech provided min/guard for safety with gait belt, recliner followed for safety, pt fatigued quickly   Stairs             Wheelchair Mobility    Modified Rankin (Stroke Patients Only)       Balance                                            Cognition Arousal/Alertness: Awake/alert Behavior During Therapy: Flat affect Overall Cognitive Status: History of cognitive impairments - at baseline                                 General Comments: pt nonverbal; inconsistent with following commands but also very HOH, does better with tactile cues      Exercises      General Comments        Pertinent Vitals/Pain Pain Assessment: Faces  Faces Pain Scale: No hurt Pain Intervention(s): Repositioned;Monitored during session    Home Living                      Prior Function            PT Goals (current goals can now be found in the care plan section) Progress towards PT goals: Progressing toward goals    Frequency    Min 2X/week      PT Plan Current plan remains appropriate    Co-evaluation              AM-PAC PT "6 Clicks" Mobility   Outcome Measure  Help needed turning from your back to your side while in a flat bed without using bedrails?: A Lot Help needed moving from lying on your back to sitting on the side  of a flat bed without using bedrails?: A Lot Help needed moving to and from a bed to a chair (including a wheelchair)?: A Lot Help needed standing up from a chair using your arms (e.g., wheelchair or bedside chair)?: A Little Help needed to walk in hospital room?: A Little Help needed climbing 3-5 steps with a railing? : A Lot 6 Click Score: 14    End of Session Equipment Utilized During Treatment: Gait belt Activity Tolerance: Patient limited by fatigue Patient left: in chair;with call bell/phone within reach;with chair alarm set Nurse Communication: Mobility status PT Visit Diagnosis: Adult, failure to thrive (R62.7);Muscle weakness (generalized) (M62.81);Difficulty in walking, not elsewhere classified (R26.2)     Time: [id number] PT Time Calculation (min) (ACUTE ONLY): 18 min  Charges:  $Gait Training: 8-22 mins                     Jannette Spanner PT, DPT Acute Rehabilitation Services Pager: [phone] Office: [phone]    York Ram E 01/15/2021, 4:22 PM

## 2021-01-15 NOTE — Progress Notes (Signed)
Assisted wife to help patient stand and walk in hallway, patient walked approximately 40 feet with walker and nurse standing behind patient.  Wife is interested in taking patient home, Dr. Darrick Meigs asked that PT re-evaluate him regarding this as SNF was prior recommendation.  Called PT office and they will reach out to see if any therapist can come and re-assess this patient.

## 2021-01-15 NOTE — TOC Initial Note (Addendum)
Transition of Care [facility]) - Initial/Assessment Note    Patient Details  Name: Mitchell Abbott MRN: [id number] Date of Birth: 12-23-1923  Transition of Care [facility]) CM/SW Contact:    Ross Ludwig, LCSW Phone Number: 01/15/2021, 6:38 PM  Clinical Narrative:                  CSW spoke to patient's wife to discuss SNF placement verse home with home health services and palliative to follow outpatient.  Per patient's wife, she is undecided about SNF placement or home with home health services.  Per patient's wife, he goes to [facility] day program, and she also has an aide that they have hired to help take care of him in the home.  CSW spent time explaining how insurance will pay and what the process is for having patient receive services.  Patient's wife gave CSW permission to begin SNF bed search, but she thinks she will take him home instead.  CSW to continue to follow patient's progress throughout discharge planning.  Expected Discharge Plan: [place] Barriers to Discharge: Continued Medical Work up   Patient Goals and CMS Choice Patient states their goals for this hospitalization and ongoing recovery are:: To have patient return home with home health services. CMS Medicare.gov Compare Post Acute Care list provided to:: Patient Represenative (must comment) Choice offered to / list presented to : Spouse  Expected Discharge Plan and Services Expected Discharge Plan: [place] In-house Referral: Clinical Social Work   Post Acute Care Choice: [place] arrangements for the past 2 months: Single Family Home                           HH Arranged: RN, PT, OT, Nurse's Aide, Speech Therapy, Social Work, Refused SNF [place] Agency: [place] Date [place]: 01/15/21 Time HH Agency Contacted: 4 Representative spoke with at [place]: [place] Arrangements/Services Living arrangements for the past 2  months: [place] with:: Spouse Patient language and need for interpreter reviewed:: Yes Do you feel safe going back to the place where you live?: Yes      Need for Family Participation in Patient Care: Yes (Comment) Care giver support system in place?: Yes (comment) Current home services: Homehealth aide Criminal Activity/Legal Involvement Pertinent to Current Situation/Hospitalization: No - Comment as needed  Activities of Daily Living Home Assistive Devices/Equipment: Dentures (specify type), Wheelchair, Hospital bed ADL Screening (condition at time of admission) Patient's cognitive ability adequate to safely complete daily activities?: No Is the patient deaf or have difficulty hearing?: Yes Does the patient have difficulty seeing, even when wearing glasses/contacts?: No Does the patient have difficulty concentrating, remembering, or making decisions?: Yes Patient able to express need for assistance with ADLs?: No Does the patient have difficulty dressing or bathing?: Yes Independently performs ADLs?: No Communication: Needs assistance Is this a change from baseline?: Pre-admission baseline Dressing (OT): Needs assistance Is this a change from baseline?: Pre-admission baseline Grooming: Needs assistance Is this a change from baseline?: Pre-admission baseline Feeding: Needs assistance Is this a change from baseline?: Pre-admission baseline Bathing: Needs assistance Is this a change from baseline?: Pre-admission baseline Toileting: Needs assistance Is this a change from baseline?: Pre-admission baseline In/Out Bed: Needs assistance Is this a change from baseline?: Pre-admission baseline Walks in Home: Needs assistance Is this a change from baseline?: Pre-admission baseline Does the patient have  difficulty walking or climbing stairs?: Yes Weakness of Legs: Both Weakness of Arms/Hands: Both  Permission Sought/Granted Permission sought to share information with :  Family Supports Permission granted to share information with : Yes, Release of Information Signed  Share Information with NAME: [place], Caltrider [phone]  [phone]  Permission granted to share info w AGENCY: Home health agencies and SNFs.        Emotional Assessment Appearance:: Appears stated age   Affect (typically observed): Accepting, Appropriate, Calm Orientation: : Oriented to Self Alcohol / Substance Use: Not Applicable Psych Involvement: No (comment)  Admission diagnosis:  UTI (urinary tract infection) [N39.0] Sepsis due to urinary tract infection ([place]) [A41.9, N39.0] Patient Active Problem List   Diagnosis Date Noted   Protein-calorie malnutrition, severe 01/13/2021   Sepsis secondary to UTI ([place]) 01/12/2021   Seborrheic keratosis 12/26/2020   Lip swelling 02/23/2020   Hearing difficulty of both ears 08/21/2019   Urethral bleeding 09/02/2018   Rash 07/25/2018   Syncope 06/27/2016   Maxillary sinusitis 05/27/2015   Apathy 01/28/2015   Allergic rhinitis 01/20/2015   Bacterial conjunctivitis of left eye 11/05/2014   Alzheimer's disease with late onset ([place]) 01/15/2014   Chronic renal insufficiency, stage II (mild) 09/03/2013   Observation for suspected malignant neoplasm 09/03/2013   Preventative health care 09/03/2013   Orthostatic syncope 02/05/2013   Mild cognitive impairment 10/20/2012   Cervical myofascial strain 08/15/2012   Sacroiliac joint pain 02/18/2012   History of TIA (transient ischemic attack)    Benign prostatic hyperplasia 10/13/2011   Headache disorder 10/13/2011   Dementia ([place]) 10/13/2011   PCP:  Tammi Sou, MD Pharmacy:   [facility] [place], [place] AT [facility] OF [address] [place] [address] Phone: [phone] Fax: [phone]  [facility] - [address], [place] [address] Phone:  [phone] Fax: [phone]     Social Determinants of Health (SDOH) Interventions    Readmission Risk Interventions No flowsheet data found.

## 2021-01-16 LAB — CULTURE, BLOOD (ROUTINE X 2)
Culture: NO GROWTH
Special Requests: ADEQUATE

## 2021-01-16 LAB — GLUCOSE, CAPILLARY: Glucose-Capillary: 143 mg/dL — ABNORMAL HIGH (ref 70–99)

## 2021-01-16 LAB — BASIC METABOLIC PANEL
Anion gap: 6 (ref 5–15)
BUN: 23 mg/dL (ref 8–23)
CO2: 24 mmol/L (ref 22–32)
Calcium: 8.7 mg/dL — ABNORMAL LOW (ref 8.9–10.3)
Chloride: 112 mmol/L — ABNORMAL HIGH (ref 98–111)
Creatinine, Ser: 0.6 mg/dL — ABNORMAL LOW (ref 0.61–1.24)
GFR, Estimated: >60 mL/min (ref >60)
Glucose, Bld: 103 mg/dL — ABNORMAL HIGH (ref 70–99)
Potassium: 4.7 mmol/L (ref 3.5–5.1)
Sodium: 142 mmol/L (ref 135–145)

## 2021-01-16 LAB — CBC
HCT: 38.2 % — ABNORMAL LOW (ref 39.0–52.0)
Hemoglobin: 12.1 g/dL — ABNORMAL LOW (ref 13.0–17.0)
MCH: 32.4 pg (ref 26.0–34.0)
MCHC: 31.7 g/dL (ref 30.0–36.0)
MCV: 102.4 fL — ABNORMAL HIGH (ref 80.0–100.0)
Platelets: 442 10*3/uL — ABNORMAL HIGH (ref 150–400)
RBC: 3.73 MIL/uL — ABNORMAL LOW (ref 4.22–5.81)
RDW: 13.4 % (ref 11.5–15.5)
WBC: 12.9 10*3/uL — ABNORMAL HIGH (ref 4.0–10.5)
nRBC: 0 % (ref 0.0–0.2)

## 2021-01-16 MED ORDER — NYSTATIN 100000 UNIT/GM EX POWD: Freq: Three times a day (TID) | CUTANEOUS | 0 refills | Status: AC

## 2021-01-16 MED ORDER — CIPROFLOXACIN HCL 500 MG PO TABS: 500.0000 mg | ORAL_TABLET | Freq: Two times a day (BID) | ORAL | 0 refills | Status: DC

## 2021-01-16 NOTE — Progress Notes (Signed)
Discharge instructions given. Wife verbalized understanding and all questions were answered.

## 2021-01-16 NOTE — Plan of Care (Signed)

## 2021-01-16 NOTE — Discharge Summary (Addendum)
Physician Discharge Summary  STEWARD COST [id number] DOB: 1923/10/27 DOA: 01/11/2021  PCP: Tammi Sou, MD  Admit date: 01/11/2021 Discharge date: 01/17/2021  Time spent: 60 minutes  Recommendations for Outpatient Follow-up:  Follow-up PCP in 1 week Patient can attend daycare on 01/19/21   Discharge Diagnoses:  Principal Problem:   Sepsis secondary to UTI [facility]) Active Problems:   Benign prostatic hyperplasia   History of TIA (transient ischemic attack)   Alzheimer's disease with late onset ([place])   Protein-calorie malnutrition, severe   Discharge Condition: Stable  Diet recommendation: Dysphagia 3 nectar thick liquid  Filed Weights   01/11/21 1932 01/12/21 1634  Weight: 60.3 kg 60.3 kg    History of present illness:  87 year old male with medical history of advanced Zimmers dementia with behavioral disturbance, history of TIA, BPH, COVID in January 2022 presented to [place] on 8/14 with complaints of intermittent elevated temperature but no real fever at home.  Patient is nonverbal and wife provides history. Was found to have abnormal UA, started on Rocephin for possible sepsis due to UTI.  Hospital Course:   Sepsis due to CAUTI, POA -Presented with sepsis, fever 100.8, leukocytosis 15.3 -Foley catheter was placed as outpatient on 01/07/2021 -UA showed many bacteria, urine culture grew Pseudomonas -IV cefepime was discontinued and patient started on p.o. ciprofloxacin.   -We will discharge on p.o. ciprofloxacin for 5 more days   Acute hypoxemic respiratory failure -O2 sats on presentation was 79% on room air -Chest x-ray showed mild bibasilar atelectasis -Oxygen has been weaned off. -O2 sats are 96% on room air   History of TIA -Continue aspirin   Advanced exams dementia -Nonverbal; at baseline   Dysphagia -Speech therapy evaluation done -Started on dysphagia 3 diet, nectar thick liquid   Urinary retention -Foley catheter placed  as outpatient -Continue Flomax -Follow-up urology as outpatient  Patient's wife was offered that patient could go to skilled nursing facility for rehab.  However she has chosen to take patient home with home health PT/OT/home health aide/social work/speech therapy/RN.  We will also provide hospital bed, wheelchair and wheelchair cushion.  Procedures:   Consultations:   Discharge Exam: Vitals:   01/16/21 0540 01/16/21 1000  BP: 132/80 113/62  Pulse: 71 80  Resp: 16   Temp: 97.8 F (36.6 C) 98.1 F (36.7 C)  SpO2: 95% 96%    General: appears in no acute distress Cardiovascular: S1-S2, regular, no murmur auscultated Respiratory: Clear to auscultation bilaterally  Discharge Instructions   Discharge Instructions     Diet - low sodium heart healthy   Complete by: As directed    Increase activity slowly   Complete by: As directed       Allergies as of 01/16/2021       Reactions   Doxycycline Swelling   Lower lip swelling   Tessalon [benzonatate] Swelling   Lower lip swelling        Medication List     STOP taking these medications    amoxicillin-clavulanate 400-57 MG/5ML suspension Commonly known as: AUGMENTIN   terbinafine 1 % cream Commonly known as: LAMISIL       TAKE these medications    aspirin 325 MG EC tablet Take 1 tablet (325 mg total) by mouth daily. What changed: how much to take   ciprofloxacin 500 MG tablet Commonly known as: CIPRO Take 1 tablet (500 mg total) by mouth 2 (two) times daily.   nystatin powder Commonly known as: MYCOSTATIN/NYSTOP Apply topically  3 (three) times daily for 5 days.   rivastigmine 13.3 MG/24HR Commonly known as: EXELON Place 13.3 mg onto the skin daily.   tamsulosin 0.4 MG Caps capsule Commonly known as: FLOMAX Take 2 capsules (0.8 mg total) by mouth daily after supper. What changed:  how much to take when to take this   vitamin A & D ointment Apply 1 application topically 2 (two) times daily.  After using toilet               Durable Medical Equipment  (From admission, onward)           Start     Ordered   01/16/21 1135  For home use only DME standard manual wheelchair with seat cushion  Once       Comments: Patient suffers from dementia , TIA which impairs their ability to perform daily activities like  bathing, dressing in the home.  A walker will not resolve issue with performing activities of daily living. A wheelchair will allow patient to safely perform daily activities. Patient can safely propel the wheelchair in the home or has a caregiver who can provide assistance. Length of need lifetime. Accessories: elevating leg rests (ELRs), wheel locks, extensions and anti-tippers.   01/16/21 1136   01/16/21 1134  For home use only DME Hospital bed  Once       Question Answer Comment  Length of Need Lifetime   Bed type Semi-electric      01/16/21 1133   01/16/21 1133  For home use only DME wheelchair cushion (seat and back)  Once        01/16/21 1133           Allergies  Allergen Reactions   Doxycycline Swelling    Lower lip swelling   Tessalon [Benzonatate] Swelling    Lower lip swelling      The results of significant diagnostics from this hospitalization (including imaging, microbiology, ancillary and laboratory) are listed below for reference.    Significant Diagnostic Studies: DG Chest Port 1 View  Result Date: 01/11/2021 CLINICAL DATA:  Shortness of breath and fevers EXAM: PORTABLE CHEST 1 VIEW COMPARISON:  11/27/2020 FINDINGS: Cardiac shadow is stable. Aortic calcifications are noted. The lungs are well aerated bilaterally. New basilar atelectasis is seen bilaterally left greater than right. No bony abnormality is noted. IMPRESSION: Mild bibasilar atelectasis. Electronically Signed   By: Inez Catalina M.D.   On: 01/11/2021 20:50    Microbiology: Recent Results (from the past 240 hour(s))  Blood culture (routine x 2)     Status: None (Preliminary  result)   Collection Time: 01/11/21  7:40 PM   Specimen: BLOOD LEFT FOREARM  Result Value Ref Range Status   Specimen Description   Final    BLOOD LEFT FOREARM BLOOD Performed at [facility], [address] [place], [place] [address]    Special Requests   Final    BOTTLES DRAWN AEROBIC AND ANAEROBIC Blood Culture adequate volume Performed at [facility], [address] [place], [place] [address]    Culture  Setup Time GRAM NEGATIVE RODS AEROBIC BOTTLE ONLY   Final   Culture   Final    GRAM NEGATIVE RODS CULTURE REINCUBATED FOR BETTER GROWTH TO BE SENT OUT FOR IDENTIFICATION AND SUSCEPTIBILTY TESTING Performed at [facility], [place] [address] [place], [place] [address]    Report Status PENDING  Incomplete  Blood Culture ID Panel (Reflexed)  Status: None   Collection Time: 01/11/21  7:40 PM  Result Value Ref Range Status   Enterococcus faecalis NOT DETECTED NOT DETECTED Final   Enterococcus Faecium NOT DETECTED NOT DETECTED Final   Listeria monocytogenes NOT DETECTED NOT DETECTED Final   Staphylococcus species NOT DETECTED NOT DETECTED Final   Staphylococcus aureus (BCID) NOT DETECTED NOT DETECTED Final   Staphylococcus epidermidis NOT DETECTED NOT DETECTED Final   Staphylococcus lugdunensis NOT DETECTED NOT DETECTED Final   Streptococcus species NOT DETECTED NOT DETECTED Final   Streptococcus agalactiae NOT DETECTED NOT DETECTED Final   Streptococcus pneumoniae NOT DETECTED NOT DETECTED Final   Streptococcus pyogenes NOT DETECTED NOT DETECTED Final   A.calcoaceticus-baumannii NOT DETECTED NOT DETECTED Final   Bacteroides fragilis NOT DETECTED NOT DETECTED Final   Enterobacterales NOT DETECTED NOT DETECTED Final   Enterobacter cloacae complex NOT DETECTED NOT DETECTED Final   Escherichia coli NOT DETECTED NOT DETECTED Final   Klebsiella aerogenes NOT DETECTED NOT DETECTED Final   Klebsiella oxytoca NOT DETECTED NOT DETECTED Final    Klebsiella pneumoniae NOT DETECTED NOT DETECTED Final   Proteus species NOT DETECTED NOT DETECTED Final   Salmonella species NOT DETECTED NOT DETECTED Final   Serratia marcescens NOT DETECTED NOT DETECTED Final   Haemophilus influenzae NOT DETECTED NOT DETECTED Final   Neisseria meningitidis NOT DETECTED NOT DETECTED Final   Pseudomonas aeruginosa NOT DETECTED NOT DETECTED Final   Stenotrophomonas maltophilia NOT DETECTED NOT DETECTED Final   Candida albicans NOT DETECTED NOT DETECTED Final   Candida auris NOT DETECTED NOT DETECTED Final   Candida glabrata NOT DETECTED NOT DETECTED Final   Candida krusei NOT DETECTED NOT DETECTED Final   Candida parapsilosis NOT DETECTED NOT DETECTED Final   Candida tropicalis NOT DETECTED NOT DETECTED Final   Cryptococcus neoformans/gattii NOT DETECTED NOT DETECTED Final    Comment: Performed at [facility], [address], [place], [place] [address]  Urine Culture     Status: Abnormal   Collection Time: 01/11/21  8:02 PM   Specimen: Urine, Random  Result Value Ref Range Status   Specimen Description   Final    URINE, RANDOM Performed at [facility], [address] [place], [place] [address]    Special Requests   Final    NONE Performed at [facility], [address] [place], [place] [address]    Culture 30,000 COLONIES/mL PSEUDOMONAS AERUGINOSA (A)  Final   Report Status 01/14/2021 FINAL  Final   Organism ID, Bacteria PSEUDOMONAS AERUGINOSA (A)  Final      Susceptibility   Pseudomonas aeruginosa - MIC*    CEFTAZIDIME 4 SENSITIVE Sensitive     CIPROFLOXACIN <=0.25 SENSITIVE Sensitive     GENTAMICIN <=1 SENSITIVE Sensitive     IMIPENEM 2 SENSITIVE Sensitive     PIP/TAZO 8 SENSITIVE Sensitive     CEFEPIME 2 SENSITIVE Sensitive     * 30,000 COLONIES/mL PSEUDOMONAS AERUGINOSA  Resp Panel by RT-PCR (Flu A&B, Covid) Nasopharyngeal Swab     Status: None   Collection Time: 01/11/21  8:28 PM   Specimen:  Nasopharyngeal Swab; Nasopharyngeal(NP) swabs in vial transport medium  Result Value Ref Range Status   SARS Coronavirus 2 by RT PCR NEGATIVE NEGATIVE Final    Comment: (NOTE) SARS-CoV-2 target nucleic acids are NOT DETECTED.  The SARS-CoV-2 RNA is generally detectable in upper respiratory specimens during the acute phase of infection. The lowest concentration of SARS-CoV-2 viral copies this assay can  detect is 138 copies/mL. A negative result does not preclude SARS-Cov-2 infection and should not be used as the sole basis for treatment or other patient management decisions. A negative result may occur with  improper specimen collection/handling, submission of specimen other than nasopharyngeal swab, presence of viral mutation(s) within the areas targeted by this assay, and inadequate number of viral copies(<138 copies/mL). A negative result must be combined with clinical observations, patient history, and epidemiological information. The expected result is Negative.  Fact Sheet for Patients:  EntrepreneurPulse.com.au  Fact Sheet for Healthcare Providers:  IncredibleEmployment.be  This test is no t yet approved or cleared by the [place] FDA and  has been authorized for detection and/or diagnosis of SARS-CoV-2 by FDA under an Emergency Use Authorization (EUA). This EUA will remain  in effect (meaning this test can be used) for the duration of the COVID-19 declaration under Section 564(b)(1) of the Act, 21 U.S.C.section 360bbb-3(b)(1), unless the authorization is terminated  or revoked sooner.       Influenza A by PCR NEGATIVE NEGATIVE Final   Influenza B by PCR NEGATIVE NEGATIVE Final    Comment: (NOTE) The Xpert Xpress SARS-CoV-2/FLU/RSV plus assay is intended as an aid in the diagnosis of influenza from Nasopharyngeal swab specimens and should not be used as a sole basis for treatment. Nasal washings and aspirates are unacceptable for  Xpert Xpress SARS-CoV-2/FLU/RSV testing.  Fact Sheet for Patients: EntrepreneurPulse.com.au  Fact Sheet for Healthcare Providers: IncredibleEmployment.be  This test is not yet approved or cleared by the [place] FDA and has been authorized for detection and/or diagnosis of SARS-CoV-2 by FDA under an Emergency Use Authorization (EUA). This EUA will remain in effect (meaning this test can be used) for the duration of the COVID-19 declaration under Section 564(b)(1) of the Act, 21 U.S.C. section 360bbb-3(b)(1), unless the authorization is terminated or revoked.  Performed at [facility], [address] [place], [place] [address]   Blood culture (routine x 2)     Status: None   Collection Time: 01/11/21  8:36 PM   Specimen: BLOOD RIGHT FOREARM  Result Value Ref Range Status   Specimen Description   Final    BLOOD RIGHT FOREARM BLOOD Performed at [facility], [address] [place], [place] [address]    Special Requests   Final    BOTTLES DRAWN AEROBIC AND ANAEROBIC Blood Culture adequate volume Performed at [facility], [address] [place], [place] [address]    Culture   Final    NO GROWTH 5 DAYS Performed at [facility], [place] [address], [place], [place] [address]    Report Status 01/16/2021 FINAL  Final     Labs: Basic Metabolic Panel: Recent Labs  Lab 01/12/21 1714 01/13/21 0409 01/14/21 0809 01/15/21 1041 01/16/21 0516  NA 136 138 137 140 142  K 4.2 4.8 4.3 3.9 4.7  CL 104 106 107 110 112*  CO2 25 24 21* 22 24  GLUCOSE 123* 106* 98 108* 103*  BUN '17 17 20 '$ 24* 23  CREATININE 0.73 0.78 0.73 0.93 0.60*  CALCIUM 8.4* 8.3* 8.1* 8.7* 8.7*   Liver Function Tests: Recent Labs  Lab 01/11/21 1940  AST 35  ALT 30  ALKPHOS 74  BILITOT 0.4  PROT 6.4*  ALBUMIN 2.9*   No results for input(s): LIPASE, AMYLASE in the last 168 hours. No results for input(s): AMMONIA in the last  168 hours. CBC: Recent Labs  Lab 01/11/21 1940 01/12/21 1714 01/13/21 0409 01/14/21 0809 01/15/21 1041 01/16/21 0516  WBC 15.3* 11.2* 13.0* 9.4 9.8 12.9*  NEUTROABS 11.4*  --   --   --   --   --   HGB 13.2 13.8 12.3* 12.5* 12.9* 12.1*  HCT 39.5 43.2 38.0* 39.5 40.5 38.2*  MCV 99.2 101.9* 101.6* 102.6* 102.5* 102.4*  PLT 327 351 330 325 483* 442*   Cardiac Enzymes: No results for input(s): CKTOTAL, CKMB, CKMBINDEX, TROPONINI in the last 168 hours. BNP: BNP (last 3 results) Recent Labs    01/11/21 1940  BNP 140.7*    ProBNP (last 3 results) No results for input(s): PROBNP in the last 8760 hours.  CBG: Recent Labs  Lab 01/11/21 1950  GLUCAP 128*     Signed:  Oswald Hillock MD.  [facility] 01/16/2021, 11:51 AM

## 2021-01-16 NOTE — Progress Notes (Signed)
CNA noted blood tinged urine during foley & peri care (small amount).  No clots noted.   Foley bag emptied.  Repositioned foley leg strap and Day shift Nurse made aware.   Dementia Pain Scale: patient resting comfortably at this time. No s/s of discomfort. Vital signs WNL.

## 2021-01-16 NOTE — TOC Transition Note (Signed)
Transition of Care [facility]) - CM/SW Discharge Note   Patient Details  Name: Mitchell Abbott MRN: [id number] Date of Birth: 09/26/1923  Transition of Care [facility]) CM/SW Contact:  Ross Ludwig, LCSW Phone Number: 01/16/2021, 4:09 PM   Clinical Narrative:     CSW checked with wife, she still feels comfortable taking him home.  CSW spoke to patient's wife to discuss if she needed any equipment.  Per patient's wife, she would like a light weight wheelchair, they already have a hospital bed, CSW contacted [employer] and they can provide equipment for patient.  CSW also informed her that outpatient palliative will follow him at home, referral [facility] to [facility].  [facility] agreed to accept patient for home health services.  CSW asked if she needed EMS transport home she said no.  Patient has a private caregiver that assists with patient's care at home.  He also goes to the [employer] day program.  Patient's wife did not express any other needs.  CSW signing off, please reconsult with social work needs.     Final next level of care: [place] Barriers to Discharge: Barriers Resolved   Patient Goals and CMS Choice Patient states their goals for this hospitalization and ongoing recovery are:: To return back home with patient's wife. CMS Medicare.gov Compare Post Acute Care list provided to:: Patient Represenative (must comment) Choice offered to / list presented to : Spouse  Discharge Placement                       Discharge Plan and Services In-house Referral: Clinical Social Work   Post Acute Care Choice: Home Health          DME Arranged: Youth worker wheelchair with seat cushion DME Agency: [employer] Date DME Agency Contacted: 01/16/21 Time DME Agency Contacted: 26 Representative spoke with at DME Agency: Freda Munro HH Arranged: PT, OT, Nurse's Aide, RN, Speech Therapy, Social Work [employer] Agency: [place] Date [place]:  01/16/21 Time [place]: 1300 Representative spoke with at [place]: [place] ([place]) Interventions     Readmission Risk Interventions No flowsheet data found.

## 2021-01-16 NOTE — Progress Notes (Signed)
   01/16/21 1435  What Happened  Was fall witnessed? Yes  Who witnessed fall? Patient spouse  Patients activity before fall to/from bed, chair, or stretcher  Point of contact buttocks  Was patient injured? No  Follow Up  MD notified Dr Darrick Meigs  Time MD notified 14  Family notified Yes - comment  Time family notified 1420  Additional tests No  Simple treatment  (None)  Progress note created (see row info) Yes  Adult Fall Risk Assessment  Risk Factor Category (scoring not indicated) High fall risk per protocol (document High fall risk)  Age 87  Fall History: Fall within 6 months prior to admission 5  Elimination; Bowel and/or Urine Incontinence 2  Elimination; Bowel and/or Urine Urgency/Frequency 2  Medications: includes PCA/Opiates, Anti-convulsants, Anti-hypertensives, Diuretics, Hypnotics, Laxatives, Sedatives, and Psychotropics 0  Patient Care Equipment 2  Mobility-Assistance 2  Mobility-Gait 2  Mobility-Sensory Deficit 2  Altered awareness of immediate physical environment 1  Impulsiveness 0  Lack of understanding of one's physical/cognitive limitations 4  Total Score 25  Patient Fall Risk Level High fall risk  Adult Fall Risk Interventions  Required Bundle Interventions *See Row Information* High fall risk - low, moderate, and high requirements implemented  Additional Interventions Pharmacy review of medications  Screening for Fall Injury Risk (To be completed on HIGH fall risk patients) - Assessing Need for Floor Mats  Risk For Fall Injury- Criteria for Floor Mats 62 years or older  Will Implement Floor Mats Yes  Pain Assessment  Pain Scale 0-10  Pain Score 0  PAINAD (Pain Assessment in Advanced Dementia)  Breathing 0  Negative Vocalization 0  Facial Expression 0  Body Language 0  Consolability 0  PAINAD Score 0  PCA/Epidural/Spinal Assessment  Respiratory Pattern Regular;Unlabored  Neurological  Neuro (WDL) X  Level of Consciousness Alert  Orientation Level  Oriented to person;Disoriented to place;Disoriented to time;Disoriented to situation  Cognition Memory impairment;Appropriate at baseline  Speech Incomprehensible  Pupil Assessment  No  Musculoskeletal  Musculoskeletal (WDL) X  Assistive Device Front wheel walker  Generalized Weakness Yes  Weight Bearing Restrictions No  Integumentary  Integumentary (WDL) X  Skin Color Appropriate for ethnicity  Skin Condition Dry  Skin Integrity Ecchymosis;Rash  Ecchymosis Location Arm;Leg  Ecchymosis Location Orientation Right;Left  Rash Location Back;Flank  Rash Location Orientation Posterior;Medial  Patient ambulated to BR with assist from wife and RN. Patient had BM. Patient assisted back to bed by patient spouse and RN. Patient positioned in bed. RN left room. Patient wife then got patient up to sit up in chair. Patient weak and wife lowered patient to floor. RN called to bedside. No injury noted. MD paged.

## 2021-01-16 NOTE — Progress Notes (Signed)
[facility] ([facility])  Hospital Liaison: RN note         This patient has been referred to our palliative care services in the community.  [facility] will continue to follow for any discharge planning needs and to coordinate continuation of palliative care in the outpatient setting.    If you have questions or need assistance, please call [phone] or contact the hospital Liaison listed on AMION.      Thank you for this referral.         Mary Anne Robertson, RN, CCM  [facility] Hospital Liaison   [phone] 

## 2021-01-16 NOTE — Discharge Instructions (Addendum)
Diet -Nectar-thick liquid;Dysphagia 3 (Mech soft)   Additional discharge instructions  Please get your medications reviewed and adjusted by your Primary MD.  Please request your Primary MD to go over all Hospital Tests and Procedure/Radiological results at the follow up, please get all Hospital records sent to your Prim MD by signing hospital release before you go home.  If you had Pneumonia of Lung problems at the Hospital: Please get a 2 view Chest X ray done in 6-8 weeks after hospital discharge or sooner if instructed by your Primary MD.  If you have Congestive Heart Failure: Please call your Cardiologist or Primary MD anytime you have any of the following symptoms:  1) 3 pound weight gain in 24 hours or 5 pounds in 1 week  2) shortness of breath, with or without a dry hacking cough  3) swelling in the hands, feet or stomach  4) if you have to sleep on extra pillows at night in order to breathe  Follow cardiac low salt diet and 1.5 lit/day fluid restriction.  If you have diabetes Accuchecks 4 times/day, Once in AM empty stomach and then before each meal. Log in all results and show them to your primary doctor at your next visit. If any glucose reading is under 80 or above 300 call your primary MD immediately.  If you have Seizure/Convulsions/Epilepsy: Please do not drive, operate heavy machinery, participate in activities at heights or participate in high speed sports until you have seen by Primary MD or a Neurologist and advised to do so again.  If you had Gastrointestinal Bleeding: Please ask your Primary MD to check a complete blood count within one week of discharge or at your next visit. Your endoscopic/colonoscopic biopsies that are pending at the time of discharge, will also need to followed by your Primary MD.  Get Medicines reviewed and adjusted. Please take all your medications with you for your next visit with your Primary MD  Please request your Primary MD to go  over all hospital tests and procedure/radiological results at the follow up, please ask your Primary MD to get all Hospital records sent to his/her office.  If you experience worsening of your admission symptoms, develop shortness of breath, life threatening emergency, suicidal or homicidal thoughts you must seek medical attention immediately by calling 911 or calling your MD immediately  if symptoms less severe.  You must read complete instructions/literature along with all the possible adverse reactions/side effects for all the Medicines you take and that have been prescribed to you. Take any new Medicines after you have completely understood and accpet all the possible adverse reactions/side effects.   Do not drive or operate heavy machinery when taking Pain medications.   Do not take more than prescribed Pain, Sleep and Anxiety Medications  Special Instructions: If you have smoked or chewed Tobacco  in the last 2 yrs please stop smoking, stop any regular Alcohol  and or any Recreational drug use.  Wear Seat belts while driving.  Please note You were cared for by a hospitalist during your hospital stay. If you have any questions about your discharge medications or the care you received while you were in the hospital after you are discharged, you can call the unit and asked to speak with the hospitalist on call if the hospitalist that took care of you is not available. Once you are discharged, your primary care physician will handle any further medical issues. Please note that NO REFILLS for any discharge medications will  be authorized once you are discharged, as it is imperative that you return to your primary care physician (or establish a relationship with a primary care physician if you do not have one) for your aftercare needs so that they can reassess your need for medications and monitor your lab values.  You can reach the hospitalist office at phone [phone] or fax [phone]   If  you do not have a primary care physician, you can call [phone] for a physician referral.

## 2021-01-16 NOTE — Progress Notes (Signed)
Patient was found on the floor and wife was next to him. Per wife, patient became weak while moving from bed to chair and she lowered him to bed safely. No injures noted. Vital signs within normal limits. MD made aware.

## 2021-01-17 DIAGNOSIS — A419 Sepsis, unspecified organism: Secondary | ICD-10-CM

## 2021-01-17 DIAGNOSIS — N390 Urinary tract infection, site not specified: Secondary | ICD-10-CM

## 2021-01-17 NOTE — TOC Progression Note (Signed)
Transition of Care [facility]) - Progression Note    Patient Details  Name: Mitchell Abbott MRN: [id number] Date of Birth: [birth year]  Transition of Care [facility]) CM/SW Contact  Purcell Mouton, RN Phone Number: 01/17/2021, 9:47 AM  Clinical Narrative:    Spoke with pt's wife at bedside concerning transportation home for pt. Mrs. Sestito was not sure. PT needs to see pt.    Expected Discharge Plan: [place] Barriers to Discharge: Barriers Resolved  Expected Discharge Plan and Services Expected Discharge Plan: [place] In-house Referral: Clinical Social Work   Post Acute Care Choice: [place] arrangements for the past 2 months: [place] Expected Discharge Date: 01/16/21               DME Arranged: Youth worker wheelchair with seat cushion DME Agency: [employer] Date DME Agency Contacted: 01/16/21 Time DME Agency Contacted: 91 Representative spoke with at DME Agency: Freda Munro HH Arranged: PT, OT, Nurse's Aide, RN, Speech Therapy, Social Work [employer] Agency: [place] Date [place]: 01/16/21 Time [place]: 93 Representative spoke with at [place]: [place]) Interventions    Readmission Risk Interventions No flowsheet data found.

## 2021-01-17 NOTE — Progress Notes (Signed)
PROGRESS NOTE   Mitchell Abbott  [id number]    DOB: May 07, 1924    DOA: 01/11/2021  PCP: Tammi Sou, MD   I have briefly reviewed patients previous medical records in [facility].  Chief Complaint  Patient presents with   Fever    With shortness of breath per wife     Brief Narrative:  87 year old married male with medical history of advanced Alzheimer's dementia with behavioral disturbance, history of TIA, BPH, COVID in January 2022 presented to [place] on 8/14 with complaints of intermittent elevated temperature but no real fever at home.  He was admitted for sepsis due to catheter associated UTI.  Patient was actually discharged yesterday but when spouse tried to assist patient up, reportedly sustained a fall without injuries.  Spouse was then uncomfortable taking him home and wanted to wait until today..  Assessment & Plan:  Principal Problem:   Sepsis secondary to UTI [facility]) Active Problems:   Benign prostatic hyperplasia   History of TIA (transient ischemic attack)   Alzheimer's disease with late onset ([place])   Protein-calorie malnutrition, severe   Sepsis due to CAUTI, POA -Presented with sepsis, fever 100.8, leukocytosis 15.3 -Foley catheter was placed as outpatient on 01/07/2021 -UA showed many bacteria, urine culture grew Pseudomonas but only 30,000 colonies per mL-sensitive to Cipro -S/p IV ceftriaxone x1, IV cefepime 8/15-8/16, oral Cipro 8/17 > -Discharged on p.o. ciprofloxacin for 5 more days -1 set of blood cultures was negative.  Second set of blood culture showed gram-negative rods only aerobic bottle and sent to [facility] for identification and sensitivity. -Sepsis resolved.  Mild leukocytosis can be followed up as outpatient. -Catheter was changed early morning hours on day of discharge 8/20.   Acute hypoxemic respiratory failure -O2 sats on presentation was 79% on room air -Chest x-ray showed mild bibasilar  atelectasis -Resolved.  Not hypoxic on room air.   History of TIA -Continue aspirin   Advanced Alzheimer's dementia -Nonverbal; at baseline.  Spouse indicates that he is back to his baseline today.  Also hard of hearing.   Dysphagia -Speech therapy evaluation done -Started on dysphagia 3 diet, nectar thick liquid   Urinary retention, recurrent -Foley catheter placed as outpatient -Continue Flomax -Follow-up urology as outpatient-placed [facility] contact details in AVS and PCP follow-up details and had RN reprint AVS for patient/family. -Overnight 8/19, developed recurrent acute urinary retention.  Bladder scan reportedly showed 1 L.  Foley catheter was changed by coud.  Tea colored urine in bag-likely due to some hematuria from Foley related trauma.  Discussed with spouse and explained.  Outpatient follow-up.  Adult failure to thrive Multifactorial due to very advanced age, very advanced dementia, multiple other medical problems listed above. Recommend palliative care consultation to address goals of care as outpatient.  Body mass index is 20.83 kg/m.  Nutritional Status Nutrition Problem: Severe Malnutrition Etiology: chronic illness (dementia) Signs/Symptoms: severe fat depletion, severe muscle depletion Interventions: Magic cup, Hormel Shake, MVI   DVT prophylaxis: enoxaparin (LOVENOX) injection 40 mg Start: 01/12/21 1800     Code Status: DNR Family Communication: I discussed in detail with patient spouse at bedside.  She had several questions and I answered each of them to her satisfaction.  Updated all care.  She is comfortable taking patient home to Disposition:  Status is: Inpatient  Remains inpatient appropriate because:Unsafe d/c plan  Dispo: The patient is from: Home  Anticipated d/c is to: Home              Patient currently is medically stable to d/c.   Difficult to place patient No        Consultants:   None  Procedures:   Foley  catheter changed 01/17/2021  Antimicrobials:    Anti-infectives (From admission, onward)    Start     Dose/Rate Route Frequency Ordered Stop   01/16/21 0000  ciprofloxacin (CIPRO) 500 MG tablet        500 mg Oral 2 times daily 01/16/21 1148     01/14/21 2000  ciprofloxacin (CIPRO) tablet 500 mg        500 mg Oral 2 times daily 01/14/21 1127     01/13/21 0600  ceFEPIme (MAXIPIME) 2 g in sodium chloride 0.9 % 100 mL IVPB  Status:  Discontinued        2 g 200 mL/hr over 30 Minutes Intravenous Every 12 hours 01/12/21 1714 01/14/21 1127   01/12/21 1800  ceFEPIme (MAXIPIME) 2 g in sodium chloride 0.9 % 100 mL IVPB        2 g 200 mL/hr over 30 Minutes Intravenous  Once 01/12/21 1700 01/12/21 1913   01/12/21 1730  cefTRIAXone (ROCEPHIN) 1 g in sodium chloride 0.9 % 100 mL IVPB  Status:  Discontinued        1 g 200 mL/hr over 30 Minutes Intravenous Every 24 hours 01/12/21 1644 01/12/21 1700   01/11/21 2130  cefTRIAXone (ROCEPHIN) 1 g in sodium chloride 0.9 % 100 mL IVPB        1 g 200 mL/hr over 30 Minutes Intravenous  Once 01/11/21 2128 01/11/21 2216         Subjective:  Patient nonverbal.  As per spouse, patient is doing much better and back to his baseline.  Also hard of hearing.  Objective:   Vitals:   01/16/21 1426 01/16/21 2027 01/17/21 0459 01/17/21 1343  BP: 134/81 135/75 139/67 108/71  Pulse: 82 83 96 84  Resp: '18 18 18 14  '$ Temp: 98.8 F (37.1 C) 98.3 F (36.8 C) 98.9 F (37.2 C) 99.1 F (37.3 C)  TempSrc: Oral   Oral  SpO2: 98% 93% 94% 90%  Weight:      Height:        General exam: Elderly male, moderately built, frail and chronically ill looking lying comfortably propped up in bed without distress. Respiratory system: Poor inspiratory effort but seems clear to auscultation. Respiratory effort normal. Cardiovascular system: S1 & S2 heard, RRR. No JVD, murmurs, rubs, gallops or clicks. No pedal edema. Gastrointestinal system: Abdomen is nondistended, soft and  nontender. No organomegaly or masses felt. Normal bowel sounds heard. Central nervous system: Alert and tracks activity around but nonverbal and does not follow instructions. No focal neurological deficits. Extremities: May have lower extremity contractures.  Moves upper extremity some. Skin: No rashes, lesions or ulcers Psychiatry: Judgement and insight impaired. Mood & affect cannot be assessed.    Data Reviewed:   I have personally reviewed following labs and imaging studies   CBC: Recent Labs  Lab 01/11/21 1940 01/12/21 1714 01/14/21 0809 01/15/21 1041 01/16/21 0516  WBC 15.3*   < > 9.4 9.8 12.9*  NEUTROABS 11.4*  --   --   --   --   HGB 13.2   < > 12.5* 12.9* 12.1*  HCT 39.5   < > 39.5 40.5 38.2*  MCV 99.2   < > 102.6* 102.5*  102.4*  PLT 327   < > 325 483* 442*   < > = values in this interval not displayed.    Basic Metabolic Panel: Recent Labs  Lab 01/14/21 0809 01/15/21 1041 01/16/21 0516  NA 137 140 142  K 4.3 3.9 4.7  CL 107 110 112*  CO2 21* 22 24  GLUCOSE 98 108* 103*  BUN 20 24* 23  CREATININE 0.73 0.93 0.60*  CALCIUM 8.1* 8.7* 8.7*    Liver Function Tests: Recent Labs  Lab 01/11/21 1940  AST 35  ALT 30  ALKPHOS 74  BILITOT 0.4  PROT 6.4*  ALBUMIN 2.9*    CBG: Recent Labs  Lab 01/11/21 1950 01/16/21 2026  GLUCAP 128* 143*    Microbiology Studies:   Recent Results (from the past 240 hour(s))  Blood culture (routine x 2)     Status: None (Preliminary result)   Collection Time: 01/11/21  7:40 PM   Specimen: BLOOD LEFT FOREARM  Result Value Ref Range Status   Specimen Description   Final    BLOOD LEFT FOREARM BLOOD Performed at [facility], [address] [place], [place] [address]    Special Requests   Final    BOTTLES DRAWN AEROBIC AND ANAEROBIC Blood Culture adequate volume Performed at [facility], [address] [place], [place] [address]    Culture  Setup Time GRAM NEGATIVE RODS AEROBIC BOTTLE  ONLY   Final   Culture   Final    GRAM NEGATIVE RODS SENT TO [facility] FOR IDENTIFICATION AND SUSCEPTIBILITY Performed at [facility], [place] [address], [place], [place] [address]    Report Status PENDING  Incomplete  Blood Culture ID Panel (Reflexed)     Status: None   Collection Time: 01/11/21  7:40 PM  Result Value Ref Range Status   Enterococcus faecalis NOT DETECTED NOT DETECTED Final   Enterococcus Faecium NOT DETECTED NOT DETECTED Final   Listeria monocytogenes NOT DETECTED NOT DETECTED Final   Staphylococcus species NOT DETECTED NOT DETECTED Final   Staphylococcus aureus (BCID) NOT DETECTED NOT DETECTED Final   Staphylococcus epidermidis NOT DETECTED NOT DETECTED Final   Staphylococcus lugdunensis NOT DETECTED NOT DETECTED Final   Streptococcus species NOT DETECTED NOT DETECTED Final   Streptococcus agalactiae NOT DETECTED NOT DETECTED Final   Streptococcus pneumoniae NOT DETECTED NOT DETECTED Final   Streptococcus pyogenes NOT DETECTED NOT DETECTED Final   A.calcoaceticus-baumannii NOT DETECTED NOT DETECTED Final   Bacteroides fragilis NOT DETECTED NOT DETECTED Final   Enterobacterales NOT DETECTED NOT DETECTED Final   Enterobacter cloacae complex NOT DETECTED NOT DETECTED Final   Escherichia coli NOT DETECTED NOT DETECTED Final   Klebsiella aerogenes NOT DETECTED NOT DETECTED Final   Klebsiella oxytoca NOT DETECTED NOT DETECTED Final   Klebsiella pneumoniae NOT DETECTED NOT DETECTED Final   Proteus species NOT DETECTED NOT DETECTED Final   Salmonella species NOT DETECTED NOT DETECTED Final   Serratia marcescens NOT DETECTED NOT DETECTED Final   Haemophilus influenzae NOT DETECTED NOT DETECTED Final   Neisseria meningitidis NOT DETECTED NOT DETECTED Final   Pseudomonas aeruginosa NOT DETECTED NOT DETECTED Final   Stenotrophomonas maltophilia NOT DETECTED NOT DETECTED Final   Candida albicans NOT DETECTED NOT DETECTED Final   Candida auris NOT DETECTED NOT DETECTED  Final   Candida glabrata NOT DETECTED NOT DETECTED Final   Candida krusei NOT DETECTED NOT DETECTED Final   Candida parapsilosis NOT DETECTED NOT DETECTED Final   Candida  tropicalis NOT DETECTED NOT DETECTED Final   Cryptococcus neoformans/gattii NOT DETECTED NOT DETECTED Final    Comment: Performed at [facility], [place] [address], [place], [place] [address]  Urine Culture     Status: Abnormal   Collection Time: 01/11/21  8:02 PM   Specimen: Urine, Random  Result Value Ref Range Status   Specimen Description   Final    URINE, RANDOM Performed at [facility], [address] [place], [place] [address]    Special Requests   Final    NONE Performed at [facility], [address] [place], [place] [address]    Culture 30,000 COLONIES/mL PSEUDOMONAS AERUGINOSA (A)  Final   Report Status 01/14/2021 FINAL  Final   Organism ID, Bacteria PSEUDOMONAS AERUGINOSA (A)  Final      Susceptibility   Pseudomonas aeruginosa - MIC*    CEFTAZIDIME 4 SENSITIVE Sensitive     CIPROFLOXACIN <=0.25 SENSITIVE Sensitive     GENTAMICIN <=1 SENSITIVE Sensitive     IMIPENEM 2 SENSITIVE Sensitive     PIP/TAZO 8 SENSITIVE Sensitive     CEFEPIME 2 SENSITIVE Sensitive     * 30,000 COLONIES/mL PSEUDOMONAS AERUGINOSA  Resp Panel by RT-PCR (Flu A&B, Covid) Nasopharyngeal Swab     Status: None   Collection Time: 01/11/21  8:28 PM   Specimen: Nasopharyngeal Swab; Nasopharyngeal(NP) swabs in vial transport medium  Result Value Ref Range Status   SARS Coronavirus 2 by RT PCR NEGATIVE NEGATIVE Final    Comment: (NOTE) SARS-CoV-2 target nucleic acids are NOT DETECTED.  The SARS-CoV-2 RNA is generally detectable in upper respiratory specimens during the acute phase of infection. The lowest concentration of SARS-CoV-2 viral copies this assay can detect is 138 copies/mL. A negative result does not preclude SARS-Cov-2 infection and should not be used as the sole basis for treatment  or other patient management decisions. A negative result may occur with  improper specimen collection/handling, submission of specimen other than nasopharyngeal swab, presence of viral mutation(s) within the areas targeted by this assay, and inadequate number of viral copies(<138 copies/mL). A negative result must be combined with clinical observations, patient history, and epidemiological information. The expected result is Negative.  Fact Sheet for Patients:  EntrepreneurPulse.com.au  Fact Sheet for Healthcare Providers:  IncredibleEmployment.be  This test is no t yet approved or cleared by the [place] FDA and  has been authorized for detection and/or diagnosis of SARS-CoV-2 by FDA under an Emergency Use Authorization (EUA). This EUA will remain  in effect (meaning this test can be used) for the duration of the COVID-19 declaration under Section 564(b)(1) of the Act, 21 U.S.C.section 360bbb-3(b)(1), unless the authorization is terminated  or revoked sooner.       Influenza A by PCR NEGATIVE NEGATIVE Final   Influenza B by PCR NEGATIVE NEGATIVE Final    Comment: (NOTE) The Xpert Xpress SARS-CoV-2/FLU/RSV plus assay is intended as an aid in the diagnosis of influenza from Nasopharyngeal swab specimens and should not be used as a sole basis for treatment. Nasal washings and aspirates are unacceptable for Xpert Xpress SARS-CoV-2/FLU/RSV testing.  Fact Sheet for Patients: EntrepreneurPulse.com.au  Fact Sheet for Healthcare Providers: IncredibleEmployment.be  This test is not yet approved or cleared by the [place] FDA and has been authorized for detection and/or diagnosis of SARS-CoV-2 by FDA under an Emergency Use Authorization (EUA). This EUA will remain in effect (meaning this test can be used) for the duration of  the COVID-19 declaration under Section 564(b)(1) of the Act, 21 U.S.C. section  360bbb-3(b)(1), unless the authorization is terminated or revoked.  Performed at [facility], [address] [place], [place] [address]   Blood culture (routine x 2)     Status: None   Collection Time: 01/11/21  8:36 PM   Specimen: BLOOD RIGHT FOREARM  Result Value Ref Range Status   Specimen Description   Final    BLOOD RIGHT FOREARM BLOOD Performed at [facility], [address] [place], [place] [address]    Special Requests   Final    BOTTLES DRAWN AEROBIC AND ANAEROBIC Blood Culture adequate volume Performed at [facility], [address] [place], [place] [address]    Culture   Final    NO GROWTH 5 DAYS Performed at [facility], [place] [address] [place], [place] [address]    Report Status 01/16/2021 FINAL  Final     Radiology Studies:  No results found.   Scheduled Meds:    aspirin EC  162 mg Oral Daily   Chlorhexidine Gluconate Cloth  6 each Topical Daily   ciprofloxacin  500 mg Oral BID   enoxaparin (LOVENOX) injection  40 mg Subcutaneous Q24H   feeding supplement  237 mL Oral BID BM   multivitamin with minerals  1 tablet Oral Daily   nystatin   Topical TID   sodium chloride flush  3 mL Intravenous Q12H   tamsulosin  0.4 mg Oral QPC supper    Continuous Infusions:     LOS: 5 days     Vernell Leep, MD, FACP, [facility]. [facility]    To contact the attending provider between 7A-7P or the covering provider during after hours 7P-7A, please log into the web site [email or web address] and access using universal [place] password for that web site. If you do not have the password, please call the hospital operator.  01/17/2021, 6:26 PM

## 2021-01-17 NOTE — TOC Progression Note (Signed)
Transition of Care [facility]) - Progression Note    Patient Details  Name: Mitchell Abbott MRN: [id number] Date of Birth: 1923-08-31  Transition [facility] CM/SW Contact  Purcell Mouton, RN Phone Number: 01/17/2021, 10:06 AM  Clinical Narrative:    Paged PT to see pt related to pt being ready for discharge.    Expected Discharge Plan: [place] Barriers to Discharge: Barriers Resolved  Expected Discharge Plan and Services Expected Discharge Plan: [place] In-house Referral: Clinical Social Work   Post Acute Care Choice: [place] arrangements for the past 2 months: [place] Expected Discharge Date: 01/16/21               DME Arranged: Youth worker wheelchair with seat cushion DME Agency: [employer] Date DME Agency Contacted: 01/16/21 Time DME Agency Contacted: 65 Representative spoke with at DME Agency: Freda Munro HH Arranged: PT, OT, Nurse's Aide, RN, Speech Therapy, Social Work [employer] Agency: [place] Date [place]: 01/16/21 Time [place]: 68 Representative spoke with at [place]: [place] ([place]) Interventions    Readmission Risk Interventions No flowsheet data found.

## 2021-01-17 NOTE — Progress Notes (Signed)
Discharge instructions given to wife. Verbalized understanding and all questions were answered.

## 2021-01-19 ENCOUNTER — Other Ambulatory Visit (HOSPITAL_COMMUNITY): Payer: Self-pay | Admitting: Nurse Practitioner

## 2021-01-19 ENCOUNTER — Other Ambulatory Visit: Payer: Self-pay | Admitting: Nurse Practitioner

## 2021-01-19 DIAGNOSIS — R338 Other retention of urine: Secondary | ICD-10-CM | POA: Diagnosis not present

## 2021-01-19 DIAGNOSIS — T83511A Infection and inflammatory reaction due to indwelling urethral catheter, initial encounter: Secondary | ICD-10-CM | POA: Diagnosis not present

## 2021-01-19 DIAGNOSIS — J9601 Acute respiratory failure with hypoxia: Secondary | ICD-10-CM | POA: Diagnosis not present

## 2021-01-19 DIAGNOSIS — A4152 Sepsis due to Pseudomonas: Secondary | ICD-10-CM | POA: Diagnosis not present

## 2021-01-19 DIAGNOSIS — F0281 Dementia in other diseases classified elsewhere with behavioral disturbance: Secondary | ICD-10-CM | POA: Diagnosis not present

## 2021-01-19 DIAGNOSIS — N390 Urinary tract infection, site not specified: Secondary | ICD-10-CM | POA: Diagnosis not present

## 2021-01-19 DIAGNOSIS — N401 Enlarged prostate with lower urinary tract symptoms: Secondary | ICD-10-CM | POA: Diagnosis not present

## 2021-01-19 DIAGNOSIS — R1310 Dysphagia, unspecified: Secondary | ICD-10-CM | POA: Diagnosis not present

## 2021-01-19 DIAGNOSIS — G301 Alzheimer's disease with late onset: Secondary | ICD-10-CM | POA: Diagnosis not present

## 2021-01-19 DIAGNOSIS — N1330 Unspecified hydronephrosis: Secondary | ICD-10-CM

## 2021-01-20 DIAGNOSIS — N390 Urinary tract infection, site not specified: Secondary | ICD-10-CM | POA: Diagnosis not present

## 2021-01-20 DIAGNOSIS — N401 Enlarged prostate with lower urinary tract symptoms: Secondary | ICD-10-CM | POA: Diagnosis not present

## 2021-01-20 DIAGNOSIS — F0281 Dementia in other diseases classified elsewhere with behavioral disturbance: Secondary | ICD-10-CM | POA: Diagnosis not present

## 2021-01-20 DIAGNOSIS — G301 Alzheimer's disease with late onset: Secondary | ICD-10-CM | POA: Diagnosis not present

## 2021-01-20 DIAGNOSIS — R1310 Dysphagia, unspecified: Secondary | ICD-10-CM | POA: Diagnosis not present

## 2021-01-20 DIAGNOSIS — J9601 Acute respiratory failure with hypoxia: Secondary | ICD-10-CM | POA: Diagnosis not present

## 2021-01-20 DIAGNOSIS — R338 Other retention of urine: Secondary | ICD-10-CM | POA: Diagnosis not present

## 2021-01-20 DIAGNOSIS — T83511A Infection and inflammatory reaction due to indwelling urethral catheter, initial encounter: Secondary | ICD-10-CM | POA: Diagnosis not present

## 2021-01-20 DIAGNOSIS — A4152 Sepsis due to Pseudomonas: Secondary | ICD-10-CM | POA: Diagnosis not present

## 2021-01-21 DIAGNOSIS — J9601 Acute respiratory failure with hypoxia: Secondary | ICD-10-CM | POA: Diagnosis not present

## 2021-01-21 DIAGNOSIS — A4152 Sepsis due to Pseudomonas: Secondary | ICD-10-CM | POA: Diagnosis not present

## 2021-01-21 DIAGNOSIS — R338 Other retention of urine: Secondary | ICD-10-CM | POA: Diagnosis not present

## 2021-01-21 DIAGNOSIS — N401 Enlarged prostate with lower urinary tract symptoms: Secondary | ICD-10-CM | POA: Diagnosis not present

## 2021-01-21 DIAGNOSIS — F0281 Dementia in other diseases classified elsewhere with behavioral disturbance: Secondary | ICD-10-CM | POA: Diagnosis not present

## 2021-01-21 DIAGNOSIS — T83511A Infection and inflammatory reaction due to indwelling urethral catheter, initial encounter: Secondary | ICD-10-CM | POA: Diagnosis not present

## 2021-01-21 DIAGNOSIS — G301 Alzheimer's disease with late onset: Secondary | ICD-10-CM | POA: Diagnosis not present

## 2021-01-21 DIAGNOSIS — R1310 Dysphagia, unspecified: Secondary | ICD-10-CM | POA: Diagnosis not present

## 2021-01-21 DIAGNOSIS — N390 Urinary tract infection, site not specified: Secondary | ICD-10-CM | POA: Diagnosis not present

## 2021-01-22 ENCOUNTER — Telehealth: Payer: Self-pay | Admitting: Family Medicine

## 2021-01-22 DIAGNOSIS — N401 Enlarged prostate with lower urinary tract symptoms: Secondary | ICD-10-CM | POA: Diagnosis not present

## 2021-01-22 DIAGNOSIS — A4152 Sepsis due to Pseudomonas: Secondary | ICD-10-CM | POA: Diagnosis not present

## 2021-01-22 DIAGNOSIS — T83511A Infection and inflammatory reaction due to indwelling urethral catheter, initial encounter: Secondary | ICD-10-CM | POA: Diagnosis not present

## 2021-01-22 DIAGNOSIS — G301 Alzheimer's disease with late onset: Secondary | ICD-10-CM | POA: Diagnosis not present

## 2021-01-22 DIAGNOSIS — N390 Urinary tract infection, site not specified: Secondary | ICD-10-CM | POA: Diagnosis not present

## 2021-01-22 DIAGNOSIS — J9601 Acute respiratory failure with hypoxia: Secondary | ICD-10-CM | POA: Diagnosis not present

## 2021-01-22 DIAGNOSIS — R1310 Dysphagia, unspecified: Secondary | ICD-10-CM | POA: Diagnosis not present

## 2021-01-22 DIAGNOSIS — R338 Other retention of urine: Secondary | ICD-10-CM | POA: Diagnosis not present

## 2021-01-22 DIAGNOSIS — F0281 Dementia in other diseases classified elsewhere with behavioral disturbance: Secondary | ICD-10-CM | POA: Diagnosis not present

## 2021-01-22 NOTE — Telephone Encounter (Signed)
LM on secure VM for Sean advising ok for PT VO and med reconciliation.

## 2021-01-22 NOTE — Telephone Encounter (Signed)
Please review and advise.

## 2021-01-22 NOTE — Telephone Encounter (Signed)
Ok for [facility] orders. Meds all look right, but it looks like he should be finishing cipro today.

## 2021-01-22 NOTE — Telephone Encounter (Signed)
[place] Name: [facility] Agency Name: Santina Evans Phone #: [phone] Service Requested: VO for PT Frequency of Visits: 2x week 2 weeks, 1x week 3 weeks  Also notifying there is confusion on pts meds. Hilliard Clark spoke with wife since pt is nonverbal & has dementia. Pt is using aspirin '325mg'$  1/2 tab daily, tamsulosin 0.'4mg'$ , terbinafine 1% cream apply to toenail 1xday, vit A&D ointment 1xday on back for rash, nystop cream 2xday on back for rash, ciprofloxacin '500mg'$  1tab 2xday, tretinoin 0.5% cream should pt be using?  Secure VM as well.

## 2021-01-23 ENCOUNTER — Other Ambulatory Visit: Payer: Self-pay

## 2021-01-23 ENCOUNTER — Encounter: Payer: Self-pay | Admitting: Family Medicine

## 2021-01-23 ENCOUNTER — Ambulatory Visit (INDEPENDENT_AMBULATORY_CARE_PROVIDER_SITE_OTHER): Payer: Medicare PPO | Admitting: Family Medicine

## 2021-01-23 VITALS — BP 133/74 | HR 79 | Temp 98.7°F

## 2021-01-23 DIAGNOSIS — A4152 Sepsis due to Pseudomonas: Secondary | ICD-10-CM | POA: Diagnosis not present

## 2021-01-23 DIAGNOSIS — F0391 Unspecified dementia with behavioral disturbance: Secondary | ICD-10-CM

## 2021-01-23 DIAGNOSIS — N390 Urinary tract infection, site not specified: Secondary | ICD-10-CM

## 2021-01-23 DIAGNOSIS — B965 Pseudomonas (aeruginosa) (mallei) (pseudomallei) as the cause of diseases classified elsewhere: Secondary | ICD-10-CM | POA: Diagnosis not present

## 2021-01-23 DIAGNOSIS — Z978 Presence of other specified devices: Secondary | ICD-10-CM | POA: Diagnosis not present

## 2021-01-23 NOTE — Progress Notes (Signed)
01/23/2021  CC:  Chief Complaint  Patient presents with   Follow-up    UTI    Patient is a 87 y.o. Caucasian male with severe dementia with behavioral disturbance and nonverbal who presents accompanied by his wife Mitchell Abbott for  hospital follow up. Dates hospitalized: 8/14-8/20, 2022. Days since d/c from hospital: 6 days. Patient was discharged from hospital to home, with [facility] palliative care consult outpt. Reason for admission to hospital: sepsis d/t UTI.  I have reviewed patient's discharge summary plus pertinent specific notes, labs, and imaging from the hospitalization.   Pt already with foley cath, presented to ED with fever, had abnl UA, cefepime transitioned to cipro based on growth of pseudomonas + susceptibility. Initially had acute hypoxic RF, mild bibasilar atelectasis on CXR, oxygen weaned off, 96% RA. He was continued on flomax and ASA at d/c and he went home with foley cath.  CURRENTLY: Mitchell Abbott is essentially back to his baseline state of health. Temp consistently 98.  He is feeding himself.  Level of alertness and level of interaction is at baseline. Fluid intake is fair, urine in foley bag fairly yellow but not cloudy or foul smelling. He finished cipro yesterday.   HH PT, OT, and ST have assessed him and per Mitchell Abbott they did not have anything much to add to what she already does.    Medication reconciliation was done today and patient is taking meds as recommended by discharging hospitalist/specialist.    PMH:  Past Medical History:  Diagnosis Date   Actinic keratoses    Adrenal hemorrhage ([place]) 2001   s/p fall from ladder--a f/u CT abd showed resolution of the hemorrhage- (duodenal polyp incidentally noted;  f/u EGD by Dr. Watt Climes 03/2000 showed small lipomatous mass in duodenum which was confirmed by biopsy.   Alzheimer's dementia ([place])    with behav disturbance and apathy.  [facility] referral as of 07/2013.  They added namenda 07/2014 due to pt's signif decline in  the prior 6 mo.   Arthralgia of knee, right 2009   exam suspicious for meniscal origin   BPH (benign prostatic hypertrophy)    History of acute urinary retention (2006)  Hx of elevated PSA (7.26) + right sided prostate nodule on DRE by urologist.  PSA did not go down with tx with antibiotics--biopsy was done and showed NO MALIGNANCY but some chronic inflammation was present--turned out related to acute cystitis (enterococcus UTI).  PSA was 1.17 in 07/2011 at urologist's (Dr. Karsten Ro).  F/u at urol is now prn as of 08/2014.   BPPV (benign paroxysmal positional vertigo)    COVID-19 virus infection 06/09/2020   DDD (degenerative disc disease), cervical    C5-6, C6-7 primarily   Headache in back of head    Neurologist treated him with PT for this problem and it helped   Hearing deficit    Bilateral; noise damage in [employer]  Has had hearing aids since about 2008.   Herpes zoster 07/25/2018   suspected--valtrex rx'd.   History of acute cystitis 08/2009   with acute prostatitis (enterococcus faecalis)   History of solitary pulmonary nodule    Initially noted on CT abd for his adrenal hemorrhage s/p fall from ladder 2001.  F/u CT chest 04/2000 showed that it had shrunk from 26m to 673mand had no malignant features.   History of TIA (transient ischemic attack) @ 2008   02/2001 and 01/2006 carotid dopplers showed 50% or less stenosis.   Left bundle branch hemiblock  mentioned in dx of old records 04/15/2009   Migraine headache with aura onset in teens   No migraines since about 2004   Poor dentition    As of 07/2019 being referred to oral surgeon for removal of all implants and all remaining teeth   RSV bronchitis 01/2020   Skin cancer     PSH:  Past Surgical History:  Procedure Laterality Date   Carotid dopplers  05/2016   1-39% stenosis bilat ICA.   cataract extrac     dental     implants   PROSTATE BIOPSY  04/07/2005   For elevated PSA + right sided prostate nodule on DRE by  urologist:  benign biopsy.   TONSILLECTOMY  preteen   TRANSTHORACIC ECHOCARDIOGRAM  06/28/2016   EF 60-65%, mild dilatation of ascending aorta.    MEDS:  Outpatient Medications Prior to Visit  Medication Sig Dispense Refill   aspirin 325 MG EC tablet Take 1 tablet (325 mg total) by mouth daily. (Patient taking differently: Take 162.5 mg by mouth daily.) 90 tablet 3   rivastigmine (EXELON) 13.3 MG/24HR Place 13.3 mg onto the skin daily.     tamsulosin (FLOMAX) 0.4 MG CAPS capsule Take 2 capsules (0.8 mg total) by mouth daily after supper. (Patient taking differently: Take 0.4 mg by mouth at bedtime.) 180 capsule 3   Vitamins A & D (VITAMIN A & D) ointment Apply 1 application topically 2 (two) times daily. After using toilet     ciprofloxacin (CIPRO) 500 MG tablet Take 1 tablet (500 mg total) by mouth 2 (two) times daily. (Patient not taking: Reported on 01/23/2021) 10 tablet 0   No facility-administered medications prior to visit.   Vitals with BMI 01/23/2021 01/17/2021 01/17/2021  Height - - -  Weight - - -  BMI - - -  Systolic Q000111Q Q000111Q [id number]  Diastolic 74 62 71  Pulse 79 82 84  O2 sat 97% on RA today  EXAM:  Gen: Alert, sitting in WC, looks up intermittently and smiles.   Doesn't speak or follow commands. CV: RRR, no m/r/g.   LUNGS: CTA bilat, nonlabored resps, good aeration in all lung fields. EXT: no clubbing or cyanosis.  no edema.  Urine in foley back: 1/2 full and medium-dark yellow, clear.  Urine in tube light yellow.  Pertinent labs/imaging   Chemistry      Component Value Date/Time   NA 142 01/16/2021 0516   K 4.7 01/16/2021 0516   CL 112 (H) 01/16/2021 0516   CO2 24 01/16/2021 0516   BUN 23 01/16/2021 0516   CREATININE 0.60 (L) 01/16/2021 0516   CREATININE 1.02 09/05/2015 1528      Component Value Date/Time   CALCIUM 8.7 (L) 01/16/2021 0516   ALKPHOS 74 01/11/2021 1940   AST 35 01/11/2021 1940   ALT 30 01/11/2021 1940   BILITOT 0.4 01/11/2021 1940     Lab  Results  Component Value Date   WBC 12.9 (H) 01/16/2021   HGB 12.1 (L) 01/16/2021   HCT 38.2 (L) 01/16/2021   MCV 102.4 (H) 01/16/2021   PLT 442 (H) 01/16/2021   01/11/21 Blood clx L arm grew Gram neg rods.  R arm no growth. 01/11/21 Urine clx grew 30K pseudomonas  ASSESSMENT/PLAN:  Pseudomonas UTI with sepsis syndrome, chronic indwelling foley catheter, dementia with behavioral disturbance. He is s/p hospitalization, finished abx yesterday, seems to be back to previous state of health. Palliative care is going to see him. HH PT/OT/speech. No med changes  or additions today. No labs. Wife Deatra James is doing a great job as usual.  I wrote a letter today stating that he can return to adult daycare 01/26/21 and that he needs nectar or honey thickened liquids.  FOLLOW UP:  as needed  Signed:  Crissie Sickles, MD           01/23/2021

## 2021-01-26 ENCOUNTER — Telehealth: Payer: Self-pay | Admitting: Family Medicine

## 2021-01-26 NOTE — Telephone Encounter (Signed)
LM for pt's wife, [name] to return call.

## 2021-01-26 NOTE — Telephone Encounter (Signed)
Please review and advise.

## 2021-01-26 NOTE — Telephone Encounter (Signed)
Patient's wife called with a few questions: In the recent letter about his condition, it states that he needs thickened liquid. She states the daycare is asking for specific examples.  An [place] for possible kidney damage was recently ordered. Patient's wife states the [facility] doctor told them this is unnecessary. Patient's wife would like to discuss.   The patient's feet and hands were swollen last night. She has eliminated salt intake and elevate his feet. What else can she do?   Please call patient to advise.

## 2021-01-26 NOTE — Telephone Encounter (Signed)
Reassure her that the kidney ultrasound is not needed.  The results will not change anything that will be done. For feet swelling, elevate feet 20 min daily if possible.  Limit sodium in food (not just added salt). Pls do a letter stating that any liquids he gets should be thickened with nectar or honey.

## 2021-01-27 ENCOUNTER — Telehealth: Payer: Self-pay | Admitting: Family Medicine

## 2021-01-27 DIAGNOSIS — N401 Enlarged prostate with lower urinary tract symptoms: Secondary | ICD-10-CM | POA: Diagnosis not present

## 2021-01-27 DIAGNOSIS — R1310 Dysphagia, unspecified: Secondary | ICD-10-CM | POA: Diagnosis not present

## 2021-01-27 DIAGNOSIS — N390 Urinary tract infection, site not specified: Secondary | ICD-10-CM | POA: Diagnosis not present

## 2021-01-27 DIAGNOSIS — R338 Other retention of urine: Secondary | ICD-10-CM | POA: Diagnosis not present

## 2021-01-27 DIAGNOSIS — J9601 Acute respiratory failure with hypoxia: Secondary | ICD-10-CM | POA: Diagnosis not present

## 2021-01-27 DIAGNOSIS — G301 Alzheimer's disease with late onset: Secondary | ICD-10-CM | POA: Diagnosis not present

## 2021-01-27 DIAGNOSIS — A4152 Sepsis due to Pseudomonas: Secondary | ICD-10-CM | POA: Diagnosis not present

## 2021-01-27 DIAGNOSIS — F0281 Dementia in other diseases classified elsewhere with behavioral disturbance: Secondary | ICD-10-CM | POA: Diagnosis not present

## 2021-01-27 DIAGNOSIS — T83511A Infection and inflammatory reaction due to indwelling urethral catheter, initial encounter: Secondary | ICD-10-CM | POA: Diagnosis not present

## 2021-01-27 NOTE — Telephone Encounter (Signed)
Yes okay 

## 2021-01-27 NOTE — Telephone Encounter (Signed)
[name], wife of patient, called to say wording in the recent letter, written today 8/30, is incorrect again.  The sentence that needs to be changed: Liquids need to be thickened to the consistency of honey or nectar.  Please change to: Liquids need to be thickened to the consistency of nectar.   Please fax updated letter to daycare that patient attends.  Patient cannot attend daycare until this resubmitted. [phone]

## 2021-01-27 NOTE — Telephone Encounter (Signed)
Received faxed orders from [facility]. Placed in Dr. McGowen's front office inbox to be signed and returned. 

## 2021-01-27 NOTE — Telephone Encounter (Signed)
Spoke with pt's wife to advise letter revision completed and faxed to his daycare facility. She was also made aware of other recommendations regarding her concerns.

## 2021-01-27 NOTE — Telephone Encounter (Signed)
Signed and put in box to go up front. Signed:  Crissie Sickles, MD           01/27/2021

## 2021-01-27 NOTE — Telephone Encounter (Signed)
Wife called, Levan Hurst # [phone]  Letter needs to read as: Liquids need to be thickened to the consistency of honey or nectar  Can this be updated and call her back to pick up?

## 2021-01-27 NOTE — Telephone Encounter (Deleted)
LM for pt's wife, [name] to return call regarding recommendations and letter. New letter completed and placed up front for pick up.

## 2021-01-27 NOTE — Telephone Encounter (Addendum)
LM for pt's wife, [name] to return call. New letter updated including: liquids need to be thickened to the consistency of honey or nectar.

## 2021-01-27 NOTE — Telephone Encounter (Signed)
Nothing further needed regarding this. Provider has completed signed [facility] written orders

## 2021-01-27 NOTE — Telephone Encounter (Signed)
Home health orders received 01/27/21 for [facility] health initiation orders: Yes.  Home health re-certification orders: No. Patient last seen by ordering physician for this condition: 01/23/21. Must be less than 90 days for re-certification and less than 30 days prior for initiation. Visit must have been for the condition the orders are being placed.  Patient meets criteria for Physician to sign orders: Yes.        Current med list has been attached: Yes        Orders placed on physicians desk for signature: 01/27/21 (date) If patient does not meet criteria for orders to be signed: pt was called to schedule appt. Appt is scheduled for N/A.   [name]

## 2021-01-27 NOTE — Telephone Encounter (Signed)
Tried calling patient's wife [name], unable to LVM. Letter updated again and faxed to number provided.     Hopkins, Dana 32 minutes ago (2:40 PM)   [name], wife of patient, called to say wording in the recent letter, written today 8/30, is incorrect again.   The sentence that needs to be changed: Liquids need to be thickened to the consistency of honey or nectar.   Please change to: Liquids need to be thickened to the consistency of nectar.     Please fax updated letter to daycare that patient attends.  Patient cannot attend daycare until this resubmitted. [phone]

## 2021-01-27 NOTE — Telephone Encounter (Signed)
LM for pt's wife, [name] to return call regarding recommendations and letter. New letter completed and placed up front for pick up.

## 2021-01-27 NOTE — Telephone Encounter (Signed)
Please see other telephone encounter.

## 2021-01-28 ENCOUNTER — Telehealth: Payer: Self-pay

## 2021-01-28 NOTE — Telephone Encounter (Signed)
Attempted to contact patient/patient's wife to schedule a Palliative Care consult appointment. No answer left a message to return call.

## 2021-01-28 NOTE — Telephone Encounter (Signed)
Faxed signed orders to [facility]. Fax confirmation received.

## 2021-01-29 ENCOUNTER — Telehealth: Payer: Self-pay

## 2021-01-29 DIAGNOSIS — F0281 Dementia in other diseases classified elsewhere with behavioral disturbance: Secondary | ICD-10-CM | POA: Diagnosis not present

## 2021-01-29 DIAGNOSIS — R338 Other retention of urine: Secondary | ICD-10-CM | POA: Diagnosis not present

## 2021-01-29 DIAGNOSIS — A4152 Sepsis due to Pseudomonas: Secondary | ICD-10-CM | POA: Diagnosis not present

## 2021-01-29 DIAGNOSIS — J9601 Acute respiratory failure with hypoxia: Secondary | ICD-10-CM | POA: Diagnosis not present

## 2021-01-29 DIAGNOSIS — G301 Alzheimer's disease with late onset: Secondary | ICD-10-CM | POA: Diagnosis not present

## 2021-01-29 DIAGNOSIS — N401 Enlarged prostate with lower urinary tract symptoms: Secondary | ICD-10-CM | POA: Diagnosis not present

## 2021-01-29 DIAGNOSIS — R1310 Dysphagia, unspecified: Secondary | ICD-10-CM | POA: Diagnosis not present

## 2021-01-29 DIAGNOSIS — T83511A Infection and inflammatory reaction due to indwelling urethral catheter, initial encounter: Secondary | ICD-10-CM | POA: Diagnosis not present

## 2021-01-29 DIAGNOSIS — N390 Urinary tract infection, site not specified: Secondary | ICD-10-CM | POA: Diagnosis not present

## 2021-01-29 NOTE — Telephone Encounter (Signed)
Signed and put in box to go up front. Signed:  Phil Kristyna Bradstreet, MD           01/29/2021  

## 2021-01-29 NOTE — Telephone Encounter (Signed)
Received faxed orders from [facility] for med addition, placed on PCP desk to review and sign, if appropriate.

## 2021-01-29 NOTE — Telephone Encounter (Signed)
Spoke with patient's wife [name] and scheduled an in-person Palliative Consult for 02/25/21 @ 3PM with Dr. Hollace Kinnier. Documentation will be noted in [place]. She requested a late afternoon appointment.   COVID screening was negative. No pets in home. Patient lives with wife.   Consent obtained; updated Outlook/Netsmart/Team List and Epic.   Family is aware they may be receiving a call from provider the day before or day of to confirm appointment.

## 2021-01-30 ENCOUNTER — Telehealth: Payer: Self-pay

## 2021-01-30 NOTE — Telephone Encounter (Signed)
[place] with [facility] calling to say patient was not at home today or yesterday to get PT services.  She states patient attends a "patient daycare" during the day, and he does not get home until after 4pm. She will have to wait until next week to get home services done after 4pm.   If you need to contact Jackelyn Poling, she can be reached at [phone].

## 2021-01-30 NOTE — Telephone Encounter (Signed)
FYI  Please see below

## 2021-01-30 NOTE — Telephone Encounter (Signed)
Noted  

## 2021-01-30 NOTE — Telephone Encounter (Signed)
Faxed signed order to [facility]. Fax confirmation received.

## 2021-02-02 DIAGNOSIS — A4152 Sepsis due to Pseudomonas: Secondary | ICD-10-CM | POA: Diagnosis not present

## 2021-02-02 DIAGNOSIS — G301 Alzheimer's disease with late onset: Secondary | ICD-10-CM | POA: Diagnosis not present

## 2021-02-02 DIAGNOSIS — J9601 Acute respiratory failure with hypoxia: Secondary | ICD-10-CM | POA: Diagnosis not present

## 2021-02-02 DIAGNOSIS — R338 Other retention of urine: Secondary | ICD-10-CM | POA: Diagnosis not present

## 2021-02-02 DIAGNOSIS — F0281 Dementia in other diseases classified elsewhere with behavioral disturbance: Secondary | ICD-10-CM | POA: Diagnosis not present

## 2021-02-02 DIAGNOSIS — R1310 Dysphagia, unspecified: Secondary | ICD-10-CM | POA: Diagnosis not present

## 2021-02-02 DIAGNOSIS — N401 Enlarged prostate with lower urinary tract symptoms: Secondary | ICD-10-CM | POA: Diagnosis not present

## 2021-02-02 DIAGNOSIS — T83511A Infection and inflammatory reaction due to indwelling urethral catheter, initial encounter: Secondary | ICD-10-CM | POA: Diagnosis not present

## 2021-02-02 DIAGNOSIS — N390 Urinary tract infection, site not specified: Secondary | ICD-10-CM | POA: Diagnosis not present

## 2021-02-03 NOTE — Telephone Encounter (Signed)
Because of patient not able to do social work eval due to not being at home, Claire Shown RN with [facility] will need verbal order for SW evaluation for this week.  Please call Debbie at [phone], verbal order can be left on her voicemail as it is secure.

## 2021-02-04 NOTE — Telephone Encounter (Signed)
Yes okay 

## 2021-02-04 NOTE — Telephone Encounter (Signed)
Please advise 

## 2021-02-05 NOTE — Telephone Encounter (Signed)
Notified Debbie of approved VO

## 2021-02-09 DIAGNOSIS — N390 Urinary tract infection, site not specified: Secondary | ICD-10-CM | POA: Diagnosis not present

## 2021-02-09 DIAGNOSIS — A4152 Sepsis due to Pseudomonas: Secondary | ICD-10-CM | POA: Diagnosis not present

## 2021-02-09 DIAGNOSIS — T83511A Infection and inflammatory reaction due to indwelling urethral catheter, initial encounter: Secondary | ICD-10-CM | POA: Diagnosis not present

## 2021-02-09 DIAGNOSIS — R338 Other retention of urine: Secondary | ICD-10-CM | POA: Diagnosis not present

## 2021-02-09 DIAGNOSIS — R1310 Dysphagia, unspecified: Secondary | ICD-10-CM | POA: Diagnosis not present

## 2021-02-09 DIAGNOSIS — F0281 Dementia in other diseases classified elsewhere with behavioral disturbance: Secondary | ICD-10-CM | POA: Diagnosis not present

## 2021-02-09 DIAGNOSIS — G301 Alzheimer's disease with late onset: Secondary | ICD-10-CM | POA: Diagnosis not present

## 2021-02-09 DIAGNOSIS — J9601 Acute respiratory failure with hypoxia: Secondary | ICD-10-CM | POA: Diagnosis not present

## 2021-02-09 DIAGNOSIS — N401 Enlarged prostate with lower urinary tract symptoms: Secondary | ICD-10-CM | POA: Diagnosis not present

## 2021-02-10 ENCOUNTER — Telehealth: Payer: Self-pay

## 2021-02-10 NOTE — Telephone Encounter (Signed)
Yes okay 

## 2021-02-10 NOTE — Telephone Encounter (Signed)
Received a call from [facility] at [facility] they are seeing the patient, wanting verbal orders to continue seeing patient as Mitchell Abbott had a new catheter placed yesterday and is having puss coming from it.  Wanting an extension for one more week, the plan is to have the Urology at the [facility] order home health but they would like to help the patient and spouse get more comfortable with this.

## 2021-02-10 NOTE — Telephone Encounter (Signed)
Please advise if ok for orders?

## 2021-02-10 NOTE — Telephone Encounter (Signed)
Verbal orders given to Beth. 

## 2021-02-11 NOTE — Telephone Encounter (Signed)
Signed and put in box to go up front. Signed:  Crissie Sickles, MD           02/11/2021

## 2021-02-11 NOTE — Telephone Encounter (Signed)
Received written orders SN 1wk3 beginning 9/18, continued foley management. Placed on PCP desk to review and sign, if appropriate.

## 2021-02-12 ENCOUNTER — Telehealth: Payer: Self-pay

## 2021-02-12 DIAGNOSIS — R1310 Dysphagia, unspecified: Secondary | ICD-10-CM | POA: Diagnosis not present

## 2021-02-12 DIAGNOSIS — N401 Enlarged prostate with lower urinary tract symptoms: Secondary | ICD-10-CM | POA: Diagnosis not present

## 2021-02-12 DIAGNOSIS — T83511A Infection and inflammatory reaction due to indwelling urethral catheter, initial encounter: Secondary | ICD-10-CM | POA: Diagnosis not present

## 2021-02-12 DIAGNOSIS — J9601 Acute respiratory failure with hypoxia: Secondary | ICD-10-CM | POA: Diagnosis not present

## 2021-02-12 DIAGNOSIS — G301 Alzheimer's disease with late onset: Secondary | ICD-10-CM | POA: Diagnosis not present

## 2021-02-12 DIAGNOSIS — N390 Urinary tract infection, site not specified: Secondary | ICD-10-CM | POA: Diagnosis not present

## 2021-02-12 DIAGNOSIS — F0281 Dementia in other diseases classified elsewhere with behavioral disturbance: Secondary | ICD-10-CM | POA: Diagnosis not present

## 2021-02-12 DIAGNOSIS — A4152 Sepsis due to Pseudomonas: Secondary | ICD-10-CM | POA: Diagnosis not present

## 2021-02-12 DIAGNOSIS — R338 Other retention of urine: Secondary | ICD-10-CM | POA: Diagnosis not present

## 2021-02-12 NOTE — Telephone Encounter (Signed)
Deidra from [facility] calling about patient having cyst/boil at groin area. Has puss & odor.  Wife would like order for urine sample so she get clean urine sample at home and bring into office like she did last time.  Please call Deidra at [phone]

## 2021-02-13 DIAGNOSIS — N401 Enlarged prostate with lower urinary tract symptoms: Secondary | ICD-10-CM | POA: Diagnosis not present

## 2021-02-13 DIAGNOSIS — J9601 Acute respiratory failure with hypoxia: Secondary | ICD-10-CM | POA: Diagnosis not present

## 2021-02-13 DIAGNOSIS — F0281 Dementia in other diseases classified elsewhere with behavioral disturbance: Secondary | ICD-10-CM | POA: Diagnosis not present

## 2021-02-13 DIAGNOSIS — N390 Urinary tract infection, site not specified: Secondary | ICD-10-CM | POA: Diagnosis not present

## 2021-02-13 DIAGNOSIS — G301 Alzheimer's disease with late onset: Secondary | ICD-10-CM | POA: Diagnosis not present

## 2021-02-13 DIAGNOSIS — T83511A Infection and inflammatory reaction due to indwelling urethral catheter, initial encounter: Secondary | ICD-10-CM | POA: Diagnosis not present

## 2021-02-13 DIAGNOSIS — R1310 Dysphagia, unspecified: Secondary | ICD-10-CM | POA: Diagnosis not present

## 2021-02-13 DIAGNOSIS — R338 Other retention of urine: Secondary | ICD-10-CM | POA: Diagnosis not present

## 2021-02-13 DIAGNOSIS — A4152 Sepsis due to Pseudomonas: Secondary | ICD-10-CM | POA: Diagnosis not present

## 2021-02-13 NOTE — Telephone Encounter (Signed)
Tried calling Mitchell Abbott but unable to LVM, if she or patient calls back please refer to this message.

## 2021-02-13 NOTE — Telephone Encounter (Signed)
Pt's wife came in the office to get cup for urine sample but was advised Dr.McGowen was not here and we would need him to place orders prior to urine sample completion. She voiced understanding. Encouraged to follow up with urologist or we could see if another [place] provider had an opening. She declined another appointment for now because she already has 2 scheduled appointments for herself. Advised she could call back if she changed her mind but he does need an evaluation. Tried calling Deidra but unable to LVM

## 2021-02-16 DIAGNOSIS — J9601 Acute respiratory failure with hypoxia: Secondary | ICD-10-CM | POA: Diagnosis not present

## 2021-02-16 DIAGNOSIS — A4152 Sepsis due to Pseudomonas: Secondary | ICD-10-CM | POA: Diagnosis not present

## 2021-02-16 DIAGNOSIS — R338 Other retention of urine: Secondary | ICD-10-CM | POA: Diagnosis not present

## 2021-02-16 DIAGNOSIS — N390 Urinary tract infection, site not specified: Secondary | ICD-10-CM | POA: Diagnosis not present

## 2021-02-16 DIAGNOSIS — R1310 Dysphagia, unspecified: Secondary | ICD-10-CM | POA: Diagnosis not present

## 2021-02-16 DIAGNOSIS — G301 Alzheimer's disease with late onset: Secondary | ICD-10-CM | POA: Diagnosis not present

## 2021-02-16 DIAGNOSIS — T83511A Infection and inflammatory reaction due to indwelling urethral catheter, initial encounter: Secondary | ICD-10-CM | POA: Diagnosis not present

## 2021-02-16 DIAGNOSIS — N401 Enlarged prostate with lower urinary tract symptoms: Secondary | ICD-10-CM | POA: Diagnosis not present

## 2021-02-16 DIAGNOSIS — F0281 Dementia in other diseases classified elsewhere with behavioral disturbance: Secondary | ICD-10-CM | POA: Diagnosis not present

## 2021-02-19 ENCOUNTER — Encounter (HOSPITAL_COMMUNITY): Payer: Self-pay | Admitting: Emergency Medicine

## 2021-02-19 ENCOUNTER — Emergency Department (HOSPITAL_COMMUNITY): Payer: Medicare PPO

## 2021-02-19 ENCOUNTER — Inpatient Hospital Stay (HOSPITAL_COMMUNITY)
Admission: EM | Admit: 2021-02-19 | Discharge: 2021-02-25 | DRG: 872 | Disposition: A | Discharge status: Home Health | Payer: Medicare PPO | Attending: Internal Medicine | Admitting: Internal Medicine

## 2021-02-19 DIAGNOSIS — Z881 Allergy status to other antibiotic agents status: Secondary | ICD-10-CM

## 2021-02-19 DIAGNOSIS — L0231 Cutaneous abscess of buttock: Secondary | ICD-10-CM | POA: Diagnosis present

## 2021-02-19 DIAGNOSIS — Z66 Do not resuscitate: Secondary | ICD-10-CM | POA: Diagnosis not present

## 2021-02-19 DIAGNOSIS — L8990 Pressure ulcer of unspecified site, unspecified stage: Secondary | ICD-10-CM | POA: Insufficient documentation

## 2021-02-19 DIAGNOSIS — D638 Anemia in other chronic diseases classified elsewhere: Secondary | ICD-10-CM | POA: Diagnosis present

## 2021-02-19 DIAGNOSIS — R531 Weakness: Secondary | ICD-10-CM

## 2021-02-19 DIAGNOSIS — N289 Disorder of kidney and ureter, unspecified: Secondary | ICD-10-CM | POA: Diagnosis present

## 2021-02-19 DIAGNOSIS — R509 Fever, unspecified: Secondary | ICD-10-CM | POA: Diagnosis not present

## 2021-02-19 DIAGNOSIS — Z8673 Personal history of transient ischemic attack (TIA), and cerebral infarction without residual deficits: Secondary | ICD-10-CM | POA: Diagnosis not present

## 2021-02-19 DIAGNOSIS — N401 Enlarged prostate with lower urinary tract symptoms: Secondary | ICD-10-CM | POA: Diagnosis present

## 2021-02-19 DIAGNOSIS — Z87440 Personal history of urinary (tract) infections: Secondary | ICD-10-CM

## 2021-02-19 DIAGNOSIS — L89322 Pressure ulcer of left buttock, stage 2: Secondary | ICD-10-CM | POA: Diagnosis present

## 2021-02-19 DIAGNOSIS — F0280 Dementia in other diseases classified elsewhere without behavioral disturbance: Secondary | ICD-10-CM | POA: Diagnosis present

## 2021-02-19 DIAGNOSIS — Z20822 Contact with and (suspected) exposure to covid-19: Secondary | ICD-10-CM | POA: Diagnosis not present

## 2021-02-19 DIAGNOSIS — L03314 Cellulitis of groin: Secondary | ICD-10-CM | POA: Diagnosis not present

## 2021-02-19 DIAGNOSIS — T83511A Infection and inflammatory reaction due to indwelling urethral catheter, initial encounter: Secondary | ICD-10-CM

## 2021-02-19 DIAGNOSIS — Z888 Allergy status to other drugs, medicaments and biological substances status: Secondary | ICD-10-CM

## 2021-02-19 DIAGNOSIS — L089 Local infection of the skin and subcutaneous tissue, unspecified: Secondary | ICD-10-CM | POA: Diagnosis not present

## 2021-02-19 DIAGNOSIS — R7881 Bacteremia: Secondary | ICD-10-CM | POA: Diagnosis not present

## 2021-02-19 DIAGNOSIS — N3289 Other specified disorders of bladder: Secondary | ICD-10-CM | POA: Diagnosis not present

## 2021-02-19 DIAGNOSIS — B9562 Methicillin resistant Staphylococcus aureus infection as the cause of diseases classified elsewhere: Secondary | ICD-10-CM | POA: Diagnosis present

## 2021-02-19 DIAGNOSIS — H9190 Unspecified hearing loss, unspecified ear: Secondary | ICD-10-CM | POA: Diagnosis present

## 2021-02-19 DIAGNOSIS — J309 Allergic rhinitis, unspecified: Secondary | ICD-10-CM | POA: Diagnosis present

## 2021-02-19 DIAGNOSIS — Z7982 Long term (current) use of aspirin: Secondary | ICD-10-CM | POA: Diagnosis not present

## 2021-02-19 DIAGNOSIS — N400 Enlarged prostate without lower urinary tract symptoms: Secondary | ICD-10-CM | POA: Diagnosis not present

## 2021-02-19 DIAGNOSIS — E871 Hypo-osmolality and hyponatremia: Secondary | ICD-10-CM | POA: Diagnosis not present

## 2021-02-19 DIAGNOSIS — R338 Other retention of urine: Secondary | ICD-10-CM | POA: Diagnosis present

## 2021-02-19 DIAGNOSIS — N390 Urinary tract infection, site not specified: Secondary | ICD-10-CM | POA: Diagnosis present

## 2021-02-19 DIAGNOSIS — A4102 Sepsis due to Methicillin resistant Staphylococcus aureus: Secondary | ICD-10-CM | POA: Diagnosis not present

## 2021-02-19 DIAGNOSIS — Z87891 Personal history of nicotine dependence: Secondary | ICD-10-CM

## 2021-02-19 DIAGNOSIS — Z85828 Personal history of other malignant neoplasm of skin: Secondary | ICD-10-CM | POA: Diagnosis not present

## 2021-02-19 DIAGNOSIS — I447 Left bundle-branch block, unspecified: Secondary | ICD-10-CM | POA: Diagnosis present

## 2021-02-19 DIAGNOSIS — G309 Alzheimer's disease, unspecified: Secondary | ICD-10-CM | POA: Diagnosis not present

## 2021-02-19 DIAGNOSIS — Z515 Encounter for palliative care: Secondary | ICD-10-CM | POA: Diagnosis not present

## 2021-02-19 DIAGNOSIS — M6281 Muscle weakness (generalized): Secondary | ICD-10-CM | POA: Diagnosis not present

## 2021-02-19 DIAGNOSIS — I959 Hypotension, unspecified: Secondary | ICD-10-CM | POA: Diagnosis not present

## 2021-02-19 DIAGNOSIS — N492 Inflammatory disorders of scrotum: Secondary | ICD-10-CM | POA: Diagnosis present

## 2021-02-19 DIAGNOSIS — F0390 Unspecified dementia without behavioral disturbance: Secondary | ICD-10-CM | POA: Diagnosis not present

## 2021-02-19 DIAGNOSIS — Z8616 Personal history of COVID-19: Secondary | ICD-10-CM

## 2021-02-19 DIAGNOSIS — I700 Atherosclerosis of aorta: Secondary | ICD-10-CM | POA: Diagnosis not present

## 2021-02-19 DIAGNOSIS — N182 Chronic kidney disease, stage 2 (mild): Secondary | ICD-10-CM | POA: Diagnosis present

## 2021-02-19 DIAGNOSIS — E43 Unspecified severe protein-calorie malnutrition: Secondary | ICD-10-CM | POA: Diagnosis not present

## 2021-02-19 DIAGNOSIS — Z79899 Other long term (current) drug therapy: Secondary | ICD-10-CM

## 2021-02-19 DIAGNOSIS — S3130XA Unspecified open wound of scrotum and testes, initial encounter: Secondary | ICD-10-CM | POA: Diagnosis not present

## 2021-02-19 DIAGNOSIS — Z7189 Other specified counseling: Secondary | ICD-10-CM | POA: Diagnosis not present

## 2021-02-19 LAB — URINALYSIS, MICROSCOPIC (REFLEX)
RBC / HPF: >50 RBC/hpf (ref 0–5)
Squamous Epithelial / HPF: NONE SEEN (ref 0–5)
WBC, UA: >50 WBC/hpf (ref 0–5)

## 2021-02-19 LAB — PROTIME-INR
INR: 1.1 (ref 0.8–1.2)
Prothrombin Time: 14.1 seconds (ref 11.4–15.2)

## 2021-02-19 LAB — COMPREHENSIVE METABOLIC PANEL
ALT: 22 U/L (ref 0–44)
AST: 20 U/L (ref 15–41)
Albumin: 2.8 g/dL — ABNORMAL LOW (ref 3.5–5.0)
Alkaline Phosphatase: 84 U/L (ref 38–126)
Anion gap: 11 (ref 5–15)
BUN: 15 mg/dL (ref 8–23)
CO2: 22 mmol/L (ref 22–32)
Calcium: 8.3 mg/dL — ABNORMAL LOW (ref 8.9–10.3)
Chloride: 100 mmol/L (ref 98–111)
Creatinine, Ser: 0.82 mg/dL (ref 0.61–1.24)
GFR, Estimated: >60 mL/min (ref >60)
Glucose, Bld: 110 mg/dL — ABNORMAL HIGH (ref 70–99)
Potassium: 4.1 mmol/L (ref 3.5–5.1)
Sodium: 133 mmol/L — ABNORMAL LOW (ref 135–145)
Total Bilirubin: 1 mg/dL (ref 0.3–1.2)
Total Protein: 6.8 g/dL (ref 6.5–8.1)

## 2021-02-19 LAB — URINALYSIS, ROUTINE W REFLEX MICROSCOPIC
Bilirubin Urine: NEGATIVE
Glucose, UA: NEGATIVE mg/dL
Ketones, ur: NEGATIVE mg/dL
Nitrite: NEGATIVE
Protein, ur: 100 mg/dL — AB
Specific Gravity, Urine: 1.015 (ref 1.005–1.030)
pH: 8 (ref 5.0–8.0)

## 2021-02-19 LAB — CBC WITH DIFFERENTIAL/PLATELET
Abs Immature Granulocytes: 0.07 10*3/uL (ref 0.00–0.07)
Basophils Absolute: 0 10*3/uL (ref 0.0–0.1)
Basophils Relative: 0 %
Eosinophils Absolute: 0 10*3/uL (ref 0.0–0.5)
Eosinophils Relative: 0 %
HCT: 35 % — ABNORMAL LOW (ref 39.0–52.0)
Hemoglobin: 11.3 g/dL — ABNORMAL LOW (ref 13.0–17.0)
Immature Granulocytes: 1 %
Lymphocytes Relative: 12 %
Lymphs Abs: 1.2 10*3/uL (ref 0.7–4.0)
MCH: 31.3 pg (ref 26.0–34.0)
MCHC: 32.3 g/dL (ref 30.0–36.0)
MCV: 97 fL (ref 80.0–100.0)
Monocytes Absolute: 1 10*3/uL (ref 0.1–1.0)
Monocytes Relative: 10 %
Neutro Abs: 8 10*3/uL — ABNORMAL HIGH (ref 1.7–7.7)
Neutrophils Relative %: 77 %
Platelets: 332 10*3/uL (ref 150–400)
RBC: 3.61 MIL/uL — ABNORMAL LOW (ref 4.22–5.81)
RDW: 14 % (ref 11.5–15.5)
WBC: 10.4 10*3/uL (ref 4.0–10.5)
nRBC: 0 % (ref 0.0–0.2)

## 2021-02-19 LAB — RESP PANEL BY RT-PCR (FLU A&B, COVID) ARPGX2
Influenza A by PCR: NEGATIVE
Influenza B by PCR: NEGATIVE
SARS Coronavirus 2 by RT PCR: NEGATIVE

## 2021-02-19 LAB — LACTIC ACID, PLASMA: Lactic Acid, Venous: 1.3 mmol/L (ref 0.5–1.9)

## 2021-02-19 LAB — APTT: aPTT: 30 seconds (ref 24–36)

## 2021-02-19 MED ORDER — LACTATED RINGERS IV BOLUS (SEPSIS)
500.0000 mL | Freq: Once | INTRAVENOUS | Status: AC
  Administered 2021-02-19: 500 mL via INTRAVENOUS

## 2021-02-19 MED ORDER — SODIUM CHLORIDE 0.9 % IV SOLN
1.0000 g | Freq: Once | INTRAVENOUS | Status: AC
  Administered 2021-02-19: 1 g via INTRAVENOUS
  Filled 2021-02-19: qty 10

## 2021-02-19 NOTE — ED Provider Notes (Signed)
[place] DEPT Provider Note   CSN: [id number] Arrival date & time: 02/19/21  1959     History Chief Complaint  Patient presents with   Fever    Mitchell Abbott is a 87 y.o. male.  Patient is a 87 year old male with a history of dementia to the point now that he is nonverbal, BPH with chronic Foley catheter, known left bundle branch block and renal insufficiency who is presenting today with EMS due to generalized weakness, more fatigue and wife reports a temperature of 101.5 tonight.  He has had more difficulty walking and she reports he seem more globally weak.  He occasionally has coughing because he chokes on his food but she has not noticed a significant cough.  He has not seemed short of breath.  He has had no vomiting or diarrhea.  Patient did see his doctor yesterday because his wife reports he has had a skin infection near his scrotum and he was prescribed antibiotics yesterday but he has not gotten them filled yet because there was a problem and she reports they were to be mailed to them.  He has not been on any antibiotics recently.  His catheter has not been changed for over a month.  He did still eat and drink today.  She does note over the last few days he has seemed more tired but today was the first day she noticed a fever.  The history is provided by the spouse, the EMS personnel and medical records.  Fever Max temp prior to arrival:  101.5 Temp source:  Oral Severity:  Moderate Onset quality:  Unable to specify Duration:  1 day Timing:  Constant Progression:  Improving Chronicity:  New     Past Medical History:  Diagnosis Date   Actinic keratoses    Adrenal hemorrhage ([place]) 2001   s/p fall from ladder--a f/u CT abd showed resolution of the hemorrhage- (duodenal polyp incidentally noted;  f/u EGD by Dr. Watt Climes 03/2000 showed small lipomatous mass in duodenum which was confirmed by biopsy.   Alzheimer's dementia ([place])    with behav  disturbance and apathy.  [facility] referral as of 07[facility].  They added namenda 07/2014 due to pt's signif decline in the prior 6 mo.   Arthralgia of knee, right 2009   exam suspicious for meniscal origin   BPH (benign prostatic hypertrophy)    History of acute urinary retention (2006)  Hx of elevated PSA (7.26) + right sided prostate nodule on DRE by urologist.  PSA did not go down with tx with antibiotics--biopsy was done and showed NO MALIGNANCY but some chronic inflammation was present--turned out related to acute cystitis (enterococcus UTI).  PSA was 1.17 in 07/2011 at urologist's (Dr. Karsten Ro).  F/u at urol is now prn as of 08/2014.   BPPV (benign paroxysmal positional vertigo)    COVID-19 virus infection 06/09/2020   DDD (degenerative disc disease), cervical    C5-6, C6-7 primarily   Headache in back of head    Neurologist treated him with PT for this problem and it helped   Hearing deficit    Bilateral; noise damage in [employer]  Has had hearing aids since about 2008.   Herpes zoster 07/25/2018   suspected--valtrex rx'd.   History of acute cystitis 08/2009   with acute prostatitis (enterococcus faecalis)   History of solitary pulmonary nodule    Initially noted on CT abd for his adrenal hemorrhage s/p fall from ladder 2001.  F/u CT  chest 04/2000 showed that it had shrunk from 19mm to 79mm and had no malignant features.   History of TIA (transient ischemic attack) @ 2008   02/2001 and 01/2006 carotid dopplers showed 50% or less stenosis.   Left bundle branch hemiblock    mentioned in dx of old records 04/15/2009   Migraine headache with aura onset in teens   No migraines since about 2004   Poor dentition    As of 07/2019 being referred to oral surgeon for removal of all implants and all remaining teeth   RSV bronchitis 01/2020   Skin cancer     Patient Active Problem List   Diagnosis Date Noted   Protein-calorie malnutrition, severe 01/13/2021   Sepsis secondary to UTI ([place])  01/12/2021   Seborrheic keratosis 12/26/2020   Lip swelling 02/23/2020   Hearing difficulty of both ears 08/21/2019   Urethral bleeding 09/02/2018   Rash 07/25/2018   Syncope 06/27/2016   Maxillary sinusitis 05/27/2015   Apathy 01/28/2015   Allergic rhinitis 01/20/2015   Bacterial conjunctivitis of left eye 11/05/2014   Alzheimer's disease with late onset ([place]) 01/15/2014   Chronic renal insufficiency, stage II (mild) 09/03/2013   Observation for suspected malignant neoplasm 09/03/2013   Preventative health care 09/03/2013   Orthostatic syncope 02/05/2013   Mild cognitive impairment 10/20/2012   Cervical myofascial strain 08/15/2012   Sacroiliac joint pain 02/18/2012   History of TIA (transient ischemic attack)    Benign prostatic hyperplasia 10/13/2011   Headache disorder 10/13/2011   Dementia ([place]) 10/13/2011    Past Surgical History:  Procedure Laterality Date   Carotid dopplers  05/2016   1-39% stenosis bilat ICA.   cataract extrac     dental     implants   PROSTATE BIOPSY  04/07/2005   For elevated PSA + right sided prostate nodule on DRE by urologist:  benign biopsy.   TONSILLECTOMY  preteen   TRANSTHORACIC ECHOCARDIOGRAM  06/28/2016   EF 60-65%, mild dilatation of ascending aorta.       Family History  Problem Relation Age of Onset   Sudden death Mother 90       FH info obtained from Dr. Ovid Curd records.   Breast cancer Daughter    Migraines Daughter     Social History   Tobacco Use   Smoking status: Former    Years: 2.00    Types: Cigarettes   Smokeless tobacco: Never  Substance Use Topics   Alcohol use: Yes    Alcohol/week: 1.0 standard drink    Types: 1 Cans of beer per week    Comment: "a good drink every day" BEER/WINE   Drug use: No    Home Medications Prior to Admission medications   Medication Sig Start Date End Date Taking? Authorizing Provider  aspirin 325 MG EC tablet Take 1 tablet (325 mg total) by mouth daily. Patient taking  differently: Take 162.5 mg by mouth daily. 10/20/12   Marcial Pacas, MD  rivastigmine (EXELON) 13.3 MG/24HR Place 13.3 mg onto the skin daily.    [provider]  tamsulosin (FLOMAX) 0.4 MG CAPS capsule Take 2 capsules (0.8 mg total) by mouth daily after supper. Patient taking differently: Take 0.4 mg by mouth at bedtime. 08/11/16   McGowen, Adrian Blackwater, MD  Vitamins A & D (VITAMIN A & D) ointment Apply 1 application topically 2 (two) times daily. After using toilet    [provider]    Allergies    Doxycycline and Tessalon [benzonatate]  Review of Systems   Review of Systems  Unable to perform ROS: Dementia  Constitutional:  Positive for fever.   Physical Exam Updated Vital Signs BP (!) 118/59   Pulse 69   Temp 99.2 F (37.3 C) (Rectal)   Resp 16   SpO2 97%   Physical Exam Vitals and nursing note reviewed.  Constitutional:      General: He is not in acute distress.    Appearance: He is well-developed and normal weight.     Comments: Sleeping but easily arousable  HENT:     Head: Normocephalic and atraumatic.     Mouth/Throat:     Mouth: Mucous membranes are dry.  Eyes:     Conjunctiva/sclera: Conjunctivae normal.     Pupils: Pupils are equal, round, and reactive to light.  Cardiovascular:     Rate and Rhythm: Normal rate and regular rhythm.     Pulses: Normal pulses.     Heart sounds: No murmur heard. Pulmonary:     Effort: Pulmonary effort is normal. No respiratory distress.     Breath sounds: Normal breath sounds. No wheezing or rales.  Abdominal:     General: There is no distension.     Palpations: Abdomen is soft.     Tenderness: There is no abdominal tenderness. There is no guarding or rebound.  Genitourinary:    Comments: Foley catheter present with a small amount of bloody discharge at the meatus. Musculoskeletal:        General: No tenderness. Normal range of motion.     Cervical back: Normal range of motion and neck supple.  Skin:    General:  Skin is warm and dry.     Findings: No erythema or rash.  Neurological:     Comments: Nonverbal but appears to move all extremities.  Does not follow commands.  Psychiatric:     Comments: Cooperative    ED Results / Procedures / Treatments   Labs (all labs ordered are listed, but only abnormal results are displayed) Labs Reviewed  COMPREHENSIVE METABOLIC PANEL - Abnormal; Notable for the following components:      Result Value   Sodium 133 (*)    Glucose, Bld 110 (*)    Calcium 8.3 (*)    Albumin 2.8 (*)    All other components within normal limits  CBC WITH DIFFERENTIAL/PLATELET - Abnormal; Notable for the following components:   RBC 3.61 (*)    Hemoglobin 11.3 (*)    HCT 35.0 (*)    Neutro Abs 8.0 (*)    All other components within normal limits  URINALYSIS, ROUTINE W REFLEX MICROSCOPIC - Abnormal; Notable for the following components:   APPearance CLOUDY (*)    Hgb urine dipstick LARGE (*)    Protein, ur 100 (*)    Leukocytes,Ua MODERATE (*)    All other components within normal limits  URINALYSIS, MICROSCOPIC (REFLEX) - Abnormal; Notable for the following components:   Bacteria, UA MANY (*)    All other components within normal limits  CULTURE, BLOOD (SINGLE)  URINE CULTURE  RESP PANEL BY RT-PCR (FLU A&B, COVID) ARPGX2  LACTIC ACID, PLASMA  PROTIME-INR  APTT    EKG EKG Interpretation  Date/Time:  Thursday February 19 2021 21:50:48 EDT Ventricular Rate:  75 PR Interval:  182 QRS Duration: 116 QT Interval:  388 QTC Calculation: 434 R Axis:   -74 Text Interpretation: Sinus rhythm Left anterior fascicular block ST elevation, consider inferior injury No significant change since last tracing Confirmed  by Blanchie Dessert [id number]) on 02/19/2021 10:15:05 PM  Radiology DG Chest Port 1 View  Result Date: 02/19/2021 CLINICAL DATA:  Fevers and possible sepsis EXAM: PORTABLE CHEST 1 VIEW COMPARISON:  01/11/2021 FINDINGS: Cardiac shadow is stable. The lungs are clear  bilaterally. No focal infiltrate or effusion is noted. No bony abnormality is seen. Mild aortic calcifications are noted. IMPRESSION: No acute abnormality noted. Electronically Signed   By: Inez Catalina M.D.   On: 02/19/2021 21:09    Procedures Procedures   Medications Ordered in ED Medications  lactated ringers bolus 500 mL (500 mLs Intravenous New Bag/Given 02/19/21 2242)  cefTRIAXone (ROCEPHIN) 1 g in sodium chloride 0.9 % 100 mL IVPB (1 g Intravenous New Bag/Given 02/19/21 2234)    ED Course  I have reviewed the triage vital signs and the nursing notes.  Pertinent labs & imaging results that were available during my care of the patient were reviewed by me and considered in my medical decision making (see chart for details).    MDM Rules/Calculators/A&P                           Elderly male presenting today with concern for sepsis with fever at home of 1.5.  Concern for COVID, aspiration pneumonia, UTI, possible cellulitis of the scrotum/groin area.  We will further evaluate by addressing the patient.  We will change Foley catheter as it has been greater than a month.  Labs and rectal temperature pending.  Currently hemodynamically stable.  10:49 PM Chest x-ray is within normal limits.  Patient has a small 1 x 2 cm abscess on his right buttocks that easily expressed a small amount of pus but has no surrounding cellulitis and no evidence of Fournier's.  Would not be the source of his fever today.  Patient's UA with concern for UTI with greater than 50 white cells, many bacteria, blood and large leukocytes.  Patient will be covered with antibiotics. CBC without acute findings.  Patient's most recent urine culture in mid August grew out Pseudomonas that was pansensitive.  We will treat with Rocephin.  10:49 PM CMP at baseline.  Foley catheter was changed.  However given patient's new weakness, fever and evidence of UTI will admit for further care.  MDM   Amount and/or Complexity of Data  Reviewed Clinical lab tests: ordered and reviewed Tests in the radiology section of CPT: ordered and reviewed Tests in the medicine section of CPT: ordered and reviewed Independent visualization of images, tracings, or specimens: yes     Final Clinical Impression(s) / ED Diagnoses Final diagnoses:  Urinary tract infection associated with indwelling urethral catheter, initial encounter ([place])  Weakness    Rx / DC Orders ED Discharge Orders     None        Blanchie Dessert, MD 02/19/21 2249

## 2021-02-19 NOTE — ED Triage Notes (Signed)
Pt arriving from home for fever. Pt is nonverbal with dementia. Lives with his wife. Wife reports pt has had some catheter leakage around cath site. Wife gave pt 1000mg  Tylenol prior to EMS arrival.

## 2021-02-19 NOTE — ED Notes (Signed)
Pt unable to answer any questions. Pt is nonverbal and has dementia. Waiting for wife to arrive to complete required documentation.

## 2021-02-20 ENCOUNTER — Other Ambulatory Visit: Payer: Self-pay

## 2021-02-20 DIAGNOSIS — N390 Urinary tract infection, site not specified: Secondary | ICD-10-CM

## 2021-02-20 LAB — BLOOD CULTURE ID PANEL (REFLEXED) - BCID2

## 2021-02-20 LAB — CBC
HCT: 36.4 % — ABNORMAL LOW (ref 39.0–52.0)
Hemoglobin: 11.8 g/dL — ABNORMAL LOW (ref 13.0–17.0)
MCH: 31.1 pg (ref 26.0–34.0)
MCHC: 32.4 g/dL (ref 30.0–36.0)
MCV: 96 fL (ref 80.0–100.0)
Platelets: 339 10*3/uL (ref 150–400)
RBC: 3.79 MIL/uL — ABNORMAL LOW (ref 4.22–5.81)
RDW: 13.8 % (ref 11.5–15.5)
WBC: 12.1 10*3/uL — ABNORMAL HIGH (ref 4.0–10.5)
nRBC: 0 % (ref 0.0–0.2)

## 2021-02-20 LAB — BASIC METABOLIC PANEL
Anion gap: 12 (ref 5–15)
BUN: 14 mg/dL (ref 8–23)
CO2: 21 mmol/L — ABNORMAL LOW (ref 22–32)
Calcium: 8.5 mg/dL — ABNORMAL LOW (ref 8.9–10.3)
Chloride: 103 mmol/L (ref 98–111)
Creatinine, Ser: 0.69 mg/dL (ref 0.61–1.24)
GFR, Estimated: >60 mL/min (ref >60)
Glucose, Bld: 135 mg/dL — ABNORMAL HIGH (ref 70–99)
Potassium: 4.3 mmol/L (ref 3.5–5.1)
Sodium: 136 mmol/L (ref 135–145)

## 2021-02-20 MED ORDER — ACETAMINOPHEN 650 MG RE SUPP
650.0000 mg | Freq: Four times a day (QID) | RECTAL | Status: DC | PRN
  Administered 2021-02-20 (×2): 650 mg via RECTAL
  Filled 2021-02-20 (×2): qty 1

## 2021-02-20 MED ORDER — ENSURE ENLIVE PO LIQD
237.0000 mL | Freq: Two times a day (BID) | ORAL | Status: DC
  Administered 2021-02-21 – 2021-02-25 (×7): 237 mL via ORAL

## 2021-02-20 MED ORDER — LACTATED RINGERS IV SOLN: INTRAVENOUS | Status: DC

## 2021-02-20 MED ORDER — SODIUM CHLORIDE 0.9 % IV SOLN
2.0000 g | Freq: Two times a day (BID) | INTRAVENOUS | Status: DC
  Administered 2021-02-20 – 2021-02-21 (×3): 2 g via INTRAVENOUS
  Filled 2021-02-20 (×4): qty 2

## 2021-02-20 MED ORDER — VANCOMYCIN HCL 750 MG/150ML IV SOLN
750.0000 mg | INTRAVENOUS | Status: DC
  Administered 2021-02-21 – 2021-02-25 (×5): 750 mg via INTRAVENOUS
  Filled 2021-02-20 (×5): qty 150

## 2021-02-20 MED ORDER — ACETAMINOPHEN 325 MG PO TABS
650.0000 mg | ORAL_TABLET | Freq: Four times a day (QID) | ORAL | Status: DC | PRN
  Filled 2021-02-20 (×3): qty 2

## 2021-02-20 MED ORDER — CHLORHEXIDINE GLUCONATE CLOTH 2 % EX PADS
6.0000 | MEDICATED_PAD | Freq: Every day | CUTANEOUS | Status: DC
  Administered 2021-02-20 – 2021-02-22 (×3): 6 via TOPICAL

## 2021-02-20 MED ORDER — BOOST / RESOURCE BREEZE PO LIQD CUSTOM
1.0000 | ORAL | Status: DC
  Administered 2021-02-21 – 2021-02-22 (×2): 1 via ORAL

## 2021-02-20 MED ORDER — SODIUM CHLORIDE 0.9 % IV SOLN: 1.0000 g | INTRAVENOUS | Status: DC

## 2021-02-20 MED ORDER — MUPIROCIN CALCIUM 2 % EX CREA
TOPICAL_CREAM | Freq: Two times a day (BID) | CUTANEOUS | Status: DC
  Administered 2021-02-21: 1 via TOPICAL
  Filled 2021-02-20 (×2): qty 15

## 2021-02-20 MED ORDER — TAMSULOSIN HCL 0.4 MG PO CAPS
0.4000 mg | ORAL_CAPSULE | Freq: Every day | ORAL | Status: DC
  Administered 2021-02-21 – 2021-02-24 (×4): 0.4 mg via ORAL
  Filled 2021-02-20 (×4): qty 1

## 2021-02-20 MED ORDER — ENOXAPARIN SODIUM 40 MG/0.4ML IJ SOSY
40.0000 mg | PREFILLED_SYRINGE | INTRAMUSCULAR | Status: DC
  Administered 2021-02-20 – 2021-02-25 (×6): 40 mg via SUBCUTANEOUS
  Filled 2021-02-20 (×6): qty 0.4

## 2021-02-20 MED ORDER — VANCOMYCIN HCL IN DEXTROSE 1-5 GM/200ML-% IV SOLN
1000.0000 mg | Freq: Once | INTRAVENOUS | Status: AC
  Administered 2021-02-20: 1000 mg via INTRAVENOUS
  Filled 2021-02-20: qty 200

## 2021-02-20 MED ORDER — ASPIRIN EC 81 MG PO TBEC
81.0000 mg | DELAYED_RELEASE_TABLET | Freq: Every day | ORAL | Status: DC
  Administered 2021-02-21 – 2021-02-25 (×5): 81 mg via ORAL
  Filled 2021-02-20 (×6): qty 1

## 2021-02-20 NOTE — H&P (Signed)
History and Physical    Mitchell Abbott HYW:[id number] DOB: [birth year] DOA: 02/19/2021  PCP: Tammi Sou, MD   Patient coming from: Home  Chief Complaint: Decreased activity level  HPI: Mitchell Abbott is a 87 y.o. male with medical history significant for dementia, chronic indwelling Foley catheter, history of TIA and skin cancers, BPH who presents by EMS with generalized weakness and more fatigued than normal.  Wife is at the bedside and gives history.  She states that patient is normally nonverbal with his dementia but he will get up and walk around the house and he goes to an adult daycare.  Today when he went to the adult daycare he sat around and was not as active as normal.  After arriving home he laid down and the wife had difficulty getting him to his get up and move around like he normally would.  She states that he has not had any cough.  She took his temperature because he felt warm and his temperature was 101.5 degrees.  He seemed very weak which is not normal for him so she brought him to the emergency room for evaluation.  He was seen yesterday for a small abscess near his scrotum and was prescribed antibiotic at the [place] but he has not had it filled yet.  Abscess drained some purulent material when palpated in the emergency room.  He has an indwelling Foley catheter which has not been changed in over a month according to the wife.  He did eat and drink earlier today and has not had any vomiting or diarrhea per the wife.  She states that he has seemed more tired the last few days but today was the first day would not get up and move around like he normally does.  ED Course: He has been hemodynamically stable in the emergency room.  He had a urine culture when he was admitted for UTI in August of this year that was pansensitive Pseudomonas aeruginosa.  Lab work tonight shows WBC 10,400 hemoglobin 11.3 hematocrit 35.0 platelets 332,000 with normal differential sodium 133 potassium  4.1 chloride 100 bicarb 22 creatinine 0.82 BUN 15 glucose 110 alkaline phosphatase 84 AST 20 ALT 22 total bili 1.0, lactic acid 1.3.  Urinalysis is cloudy with moderate leukocyte esterase with many bacteria and WBCs.  Hospitalist service asked to admit for further management  Review of Systems:  Review of system cannot be obtained secondary to dementia  Past Medical History:  Diagnosis Date   Actinic keratoses    Adrenal hemorrhage ([place]) 2001   s/p fall from ladder--a f/u CT abd showed resolution of the hemorrhage- (duodenal polyp incidentally noted;  f/u EGD by Dr. Watt Climes 03/2000 showed small lipomatous mass in duodenum which was confirmed by biopsy.   Alzheimer's dementia ([place])    with behav disturbance and apathy.  [facility] referral as of 07/2013.  They added namenda 07/2014 due to pt's signif decline in the prior 6 mo.   Arthralgia of knee, right 2009   exam suspicious for meniscal origin   BPH (benign prostatic hypertrophy)    History of acute urinary retention (2006)  Hx of elevated PSA (7.26) + right sided prostate nodule on DRE by urologist.  PSA did not go down with tx with antibiotics--biopsy was done and showed NO MALIGNANCY but some chronic inflammation was present--turned out related to acute cystitis (enterococcus UTI).  PSA was 1.17 in 07/2011 at urologist's (Dr. Karsten Ro).  F/u at urol is now prn  as of 08/2014.   BPPV (benign paroxysmal positional vertigo)    COVID-19 virus infection 06/09/2020   DDD (degenerative disc disease), cervical    C5-6, C6-7 primarily   Headache in back of head    Neurologist treated him with PT for this problem and it helped   Hearing deficit    Bilateral; noise damage in [employer]  Has had hearing aids since about 2008.   Herpes zoster 07/25/2018   suspected--valtrex rx'd.   History of acute cystitis 08/2009   with acute prostatitis (enterococcus faecalis)   History of solitary pulmonary nodule    Initially noted on CT abd for his adrenal  hemorrhage s/p fall from ladder 2001.  F/u CT chest 04/2000 showed that it had shrunk from 25mm to 36mm and had no malignant features.   History of TIA (transient ischemic attack) @ 2008   02/2001 and 01/2006 carotid dopplers showed 50% or less stenosis.   Left bundle branch hemiblock    mentioned in dx of old records 04/15/2009   Migraine headache with aura onset in teens   No migraines since about 2004   Poor dentition    As of 07/2019 being referred to oral surgeon for removal of all implants and all remaining teeth   RSV bronchitis 01/2020   Skin cancer     Past Surgical History:  Procedure Laterality Date   Carotid dopplers  05/2016   1-39% stenosis bilat ICA.   cataract extrac     dental     implants   PROSTATE BIOPSY  04/07/2005   For elevated PSA + right sided prostate nodule on DRE by urologist:  benign biopsy.   TONSILLECTOMY  preteen   TRANSTHORACIC ECHOCARDIOGRAM  06/28/2016   EF 60-65%, mild dilatation of ascending aorta.    Social History  reports that he has quit smoking. His smoking use included cigarettes. He has never used smokeless tobacco. He reports current alcohol use of about 1.0 standard drink per week. He reports that he does not use drugs.  Allergies  Allergen Reactions   Doxycycline Swelling    Lower lip swelling   Tessalon [Benzonatate] Swelling    Lower lip swelling    Family History  Problem Relation Age of Onset   Sudden death Mother 73       FH info obtained from Dr. Ovid Curd records.   Breast cancer Daughter    Migraines Daughter      Prior to Admission medications   Medication Sig Start Date End Date Taking? Authorizing Provider  aspirin 325 MG EC tablet Take 1 tablet (325 mg total) by mouth daily. Patient taking differently: Take 162.5 mg by mouth daily. 10/20/12  Yes Marcial Pacas, MD  tamsulosin (FLOMAX) 0.4 MG CAPS capsule Take 2 capsules (0.8 mg total) by mouth daily after supper. Patient taking differently: Take 0.4 mg by mouth at  bedtime. 08/11/16  Yes McGowen, Adrian Blackwater, MD  Vitamins A & D (VITAMIN A & D) ointment Apply 1 application topically 2 (two) times daily. After using toilet   Yes [provider]  rivastigmine (EXELON) 13.3 MG/24HR Place 13.3 mg onto the skin daily. Patient not taking: No sig reported    [provider]    Physical Exam: Vitals:   02/19/21 2130 02/19/21 2145 02/19/21 2200 02/19/21 2230  BP: 122/61 106/60 139/71 (!) 118/59  Pulse: 72 68 83 69  Resp:   16 16  Temp:      TempSrc:      SpO2:  95% 96% 97% 97%    Constitutional: NAD, calm, comfortable Vitals:   02/19/21 2130 02/19/21 2145 02/19/21 2200 02/19/21 2230  BP: 122/61 106/60 139/71 (!) 118/59  Pulse: 72 68 83 69  Resp:   16 16  Temp:      TempSrc:      SpO2: 95% 96% 97% 97%   General: Elderly frail male. Eyes: PERRL, conjunctivae normal.  Sclera nonicteric HENT:  [place]/AT, external ears normal.  Nares patent without epistasis.  Mucous membranes are dry Neck: Soft, normal range of motion, supple, no masses, Trachea midline Respiratory: clear to auscultation bilaterally, no wheezing, no crackles. Normal respiratory effort. No accessory muscle use.  Cardiovascular: Regular rate and rhythm, no murmurs / rubs / gallops. No extremity edema. 2+ pedal pulses.  Abdomen: Soft, no tenderness, nondistended, No masses palpated. Bowel sounds normoactive Musculoskeletal: no cyanosis. No joint deformity upper and lower extremities. Normal muscle tone.  Skin: Warm, dry, intact no rashes, lesions, ulcers. No induration Neurologic: Opens eyes to voice. Nonverbal at baseline. Moves all extremities spontaneously.    Labs on Admission: I have personally reviewed following labs and imaging studies  CBC: Recent Labs  Lab 02/19/21 2125  WBC 10.4  NEUTROABS 8.0*  HGB 11.3*  HCT 35.0*  MCV 97.0  PLT 007    Basic Metabolic Panel: Recent Labs  Lab 02/19/21 2125  NA 133*  K 4.1  CL 100  CO2 22  GLUCOSE 110*  BUN 15   CREATININE 0.82  CALCIUM 8.3*    GFR: CrCl cannot be calculated (Unknown ideal weight.).  Liver Function Tests: Recent Labs  Lab 02/19/21 2125  AST 20  ALT 22  ALKPHOS 84  BILITOT 1.0  PROT 6.8  ALBUMIN 2.8*    Urine analysis:    Component Value Date/Time   COLORURINE YELLOW 02/19/2021 2125   APPEARANCEUR CLOUDY (A) 02/19/2021 2125   LABSPEC 1.015 02/19/2021 2125   PHURINE 8.0 02/19/2021 2125   GLUCOSEU NEGATIVE 02/19/2021 2125   GLUCOSEU NEGATIVE 01/05/2012 0914   HGBUR LARGE (A) 02/19/2021 2125   BILIRUBINUR NEGATIVE 02/19/2021 2125   BILIRUBINUR negative 01/17/2017 1206   KETONESUR NEGATIVE 02/19/2021 2125   PROTEINUR 100 (A) 02/19/2021 2125   UROBILINOGEN 0.2 01/17/2017 1206   UROBILINOGEN 0.2 01/05/2012 0914   NITRITE NEGATIVE 02/19/2021 2125   LEUKOCYTESUR MODERATE (A) 02/19/2021 2125    Radiological Exams on Admission: DG Chest Port 1 View  Result Date: 02/19/2021 CLINICAL DATA:  Fevers and possible sepsis EXAM: PORTABLE CHEST 1 VIEW COMPARISON:  01/11/2021 FINDINGS: Cardiac shadow is stable. The lungs are clear bilaterally. No focal infiltrate or effusion is noted. No bony abnormality is seen. Mild aortic calcifications are noted. IMPRESSION: No acute abnormality noted. Electronically Signed   By: Inez Catalina M.D.   On: 02/19/2021 21:09    EKG: Independently reviewed.  EKG is reviewed and shows normal sinus rhythm with nonspecific ST changes but no acute ST elevation or depression.  QTc 434  Assessment/Plan Principal Problem:   Complicated UTI (urinary tract infection) Mr. Sopp is admitted to med-surg floor.  He has had indwelling foley for over a month. Foley changed in the ER tonight.  Given dose of rocephin in Er.  Pt had urine culture in August when admitted for UTI that showed pansensitive Pseudomonas aeruginosa.  Urine cultures been obtained tonight and sent to the lab.  We will treat patient with cefepime which will cover for Pseudomonas  infection.  Antibiotic will be adjusted as needed if culture  results dictate IV fluid hydration with LR 75 mils per hour overnight Recheck CBC, electrolytes and renal function in morning Patient placed on fall precautions  Active Problems:   Generalized weakness Patient normally ambulates and goes to an adult daycare center but today he would not get up and walk around he normally does and when he got home he just laid down and the wife could not get him up so brought him to the emergency room.  Most likely secondary to infection we will continue to monitor as infection is treated.    Hyponatremia Mild.  IV fluid hydration with LR Recheck electrolytes in morning    Dementia  Chronic and stable     DVT prophylaxis: Lovenox for DVT prophylaxis  Code Status:   DNR  Family Communication:  Diagnosis and plan discussed with wife at the bedside.  Verbalized understanding.  Questions answered.  Further recommendations to follow as clinical indicated Disposition Plan:   Patient is from:  Home  Anticipated DC to:  Home  Anticipated DC date:  Anticipate 2 midnights or more in hospital   Admission status:  Inpatient   Yevonne Aline Israel Werts MD [facility]  How to contact the [facility] Attending or Consulting provider [place] or covering provider during after hours [place], for this patient?   Check the care team in [facility] and look for a) attending/consulting [facility] provider listed and b) the [facility] team listed Log into [email or web address] and use [place]'s universal password to access. If you do not have the password, please contact the hospital operator. Locate the [facility] provider you are looking for under [facility] and page to a number that you can be directly reached. If you still have difficulty reaching the provider, please page the [facility] (Director on Call) for the Hospitalists listed on amion for assistance.  02/20/2021, 12:02 AM

## 2021-02-20 NOTE — Progress Notes (Signed)
Patient ID: Mitchell Abbott, male   DOB: 13-Aug-1923, 87 y.o.   MRN: [id number] Patient admitted early this morning for UTI and started on IV antibiotics and fluids.  Patient seen and examined at bedside and plan of care discussed with wife at bedside.  I have reviewed recent medical records including this morning's H&P, current vitals, labs and medications myself.  Continue antibiotics.  Follow cultures.

## 2021-02-20 NOTE — Progress Notes (Signed)
   02/20/21 0337  Assess: MEWS Score  Temp (!) 101.8 F (38.8 C)  BP (!) 175/82  Pulse Rate (!) 104  Resp 20  Level of Consciousness Alert  SpO2 100 %  O2 Device Room Air  Assess: MEWS Score  MEWS Temp 2  MEWS Systolic 0  MEWS Pulse 1  MEWS RR 0  MEWS LOC 0  MEWS Score 3  MEWS Score Color Yellow  Assess: if the MEWS score is Yellow or Red  Were vital signs taken at a resting state? No  Focused Assessment No change from prior assessment  Does the patient meet 2 or more of the SIRS criteria? Yes  Does the patient have a confirmed or suspected source of infection? Yes  Provider and Rapid Response Notified? Yes  MEWS guidelines implemented *See Row Information* Yes  Treat  MEWS Interventions Administered prn meds/treatments  Take Vital Signs  Increase Vital Sign Frequency  Yellow: Q 2hr X 2 then Q 4hr X 2, if remains yellow, continue Q 4hrs  Notify: Charge Nurse/RN  Name of Charge Nurse/RN Notified Carma Leaven  Date Charge Nurse/RN Notified 02/20/21  Time Charge Nurse/RN Notified 0340  Notify: Provider  Provider Name/Title Jeannette Corpus ANP  Date Provider Notified 02/20/21  Time Provider Notified [phone]  Notification Type Page  Notification Reason Change in status  Provider response No new orders  Date of Provider Response 02/20/21  Document  Progress note created (see row info) Yes  Assess: SIRS CRITERIA  SIRS Temperature  1  SIRS Pulse 1  SIRS Respirations  0  SIRS WBC 1  SIRS Score Sum  3  Administered PRN tylenol rectally. Pt has confirmed UTI and already received 2 doses of Antibiotics in ED. Provider notified, no new orders. Pt is very confused and gets agitated whenever we attempt to take VS. Will continue to monitor.

## 2021-02-20 NOTE — ED Notes (Signed)
Patients legal guardian, wife, remains at bedside.

## 2021-02-20 NOTE — Progress Notes (Signed)
Initial Nutrition Assessment  DOCUMENTATION CODES:   Not applicable  INTERVENTION:  - will order Boost Breeze once/day, each supplement provides 250 kcal and 9 grams of protein. - will order Ensure Plus BID, each supplement provides 350 kcal and 13 grams of protein - will order 1 tablet multivitamin with minerals/day - complete NFPE when feasible.    NUTRITION DIAGNOSIS:   Increased nutrient needs related to acute illness, wound healing as evidenced by estimated needs.  GOAL:   Patient will meet greater than or equal to 90% of their needs  MONITOR:   PO intake, Supplement acceptance, Labs, Weight trends  REASON FOR ASSESSMENT:   Low Braden    ASSESSMENT:   87 y.o. male with medical history significant for dementia, chronic indwelling Foley catheter, history of TIA and skin cancers, and BPH. He presented to the ED via EMS due to generalized weakness, increased fatigue, and fever of 101.5. Wife reported that patient is non-verbal at baseline.  Patient noted to be disoriented x4. He was sleeping at the time of attempted visit and no family or visitors present. Visualized patient from the hall way.  No meal completion percentages documented since admission.   Weight today is 132 lb and weight on 12/18/20 was 139 lb. This indicates 7 lb weight loss (5% body weight) in the past 2 months; not significant for time frame.   Notes indicate dx of complicated UTI.    Labs reviewed; Ca: 8.5 mg/dl.  Medications reviewed.  IVF; LR @ 75 ml/hr.    NUTRITION - FOCUSED PHYSICAL EXAM:  Unable to complete at this time.  Diet Order:   Diet Order             Diet regular Room service appropriate? Yes; Fluid consistency: Nectar Thick  Diet effective now                   EDUCATION NEEDS:   No education needs have been identified at this time  Skin:  Skin Assessment: Skin Integrity Issues: Skin Integrity Issues:: Stage II Stage II: bilateral buttocks  Last BM:   PTA/unknown  Height:   Ht Readings from Last 1 Encounters:  02/20/21 5\' 9"  (1.753 m)    Weight:   Wt Readings from Last 1 Encounters:  02/20/21 60 kg     Estimated Nutritional Needs:  Kcal:  1680-1850 kcal Protein:  75-85 grams Fluid:  >/= 1.8 L/day      Jarome Matin, MS, RD, LDN, CNSC Inpatient Clinical Dietitian RD pager # available in [facility]  After hours/weekend pager # available in [facility]

## 2021-02-20 NOTE — Plan of Care (Signed)

## 2021-02-20 NOTE — Progress Notes (Signed)
PHARMACY - PHYSICIAN COMMUNICATION CRITICAL VALUE ALERT - BLOOD CULTURE IDENTIFICATION (BCID)  Mitchell Abbott is an 87 y.o. male who presented to [facility] on 02/19/2021 with a chief complaint of UTI  Assessment:   BCx growing GPC in 1/4 with MRSA per BCID  Name of physician (or Provider) Contacted: Lovey Newcomer NP  Current antibiotics: vancomycin and cefepime  Changes to prescribed antibiotics recommended:  No change for now, f/u culture results    Results for orders placed or performed during the hospital encounter of 02/19/21  Blood Culture ID Panel (Reflexed) (Collected: 02/19/2021  9:25 PM)  Result Value Ref Range   Enterococcus faecalis NOT DETECTED NOT DETECTED   Enterococcus Faecium NOT DETECTED NOT DETECTED   Listeria monocytogenes NOT DETECTED NOT DETECTED   Staphylococcus species DETECTED (A) NOT DETECTED   Staphylococcus aureus (BCID) DETECTED (A) NOT DETECTED   Staphylococcus epidermidis NOT DETECTED NOT DETECTED   Staphylococcus lugdunensis NOT DETECTED NOT DETECTED   Streptococcus species NOT DETECTED NOT DETECTED   Streptococcus agalactiae NOT DETECTED NOT DETECTED   Streptococcus pneumoniae NOT DETECTED NOT DETECTED   Streptococcus pyogenes NOT DETECTED NOT DETECTED   A.calcoaceticus-baumannii NOT DETECTED NOT DETECTED   Bacteroides fragilis NOT DETECTED NOT DETECTED   Enterobacterales NOT DETECTED NOT DETECTED   Enterobacter cloacae complex NOT DETECTED NOT DETECTED   Escherichia coli NOT DETECTED NOT DETECTED   Klebsiella aerogenes NOT DETECTED NOT DETECTED   Klebsiella oxytoca NOT DETECTED NOT DETECTED   Klebsiella pneumoniae NOT DETECTED NOT DETECTED   Proteus species NOT DETECTED NOT DETECTED   Salmonella species NOT DETECTED NOT DETECTED   Serratia marcescens NOT DETECTED NOT DETECTED   Haemophilus influenzae NOT DETECTED NOT DETECTED   Neisseria meningitidis NOT DETECTED NOT DETECTED   Pseudomonas aeruginosa NOT DETECTED NOT DETECTED    Stenotrophomonas maltophilia NOT DETECTED NOT DETECTED   Candida albicans NOT DETECTED NOT DETECTED   Candida auris NOT DETECTED NOT DETECTED   Candida glabrata NOT DETECTED NOT DETECTED   Candida krusei NOT DETECTED NOT DETECTED   Candida parapsilosis NOT DETECTED NOT DETECTED   Candida tropicalis NOT DETECTED NOT DETECTED   Cryptococcus neoformans/gattii NOT DETECTED NOT DETECTED   Meth resistant mecA/C and MREJ DETECTED (A) NOT DETECTED    Napoleon Form 02/20/2021  9:25 PM

## 2021-02-20 NOTE — Progress Notes (Addendum)
Pharmacy Antibiotic Note  Mitchell Abbott is a 87 y.o. male admitted on 02/19/2021 with UTI.  Pharmacy has been consulted for vancomycin dosing.  Plan: Vancomycin 1000 mg IV x1 the 750 mg IV q24h  Cefepime 2 gr IV q12h per MD  Monitor clinical course, renal function, cultures as available      Temp (24hrs), Avg:99.9 F (37.7 C), Min:98.7 F (37.1 C), Max:101.8 F (38.8 C)  Recent Labs  Lab 02/19/21 2125 02/20/21 0334  WBC 10.4 12.1*  CREATININE 0.82 0.69  LATICACIDVEN 1.3  --     CrCl cannot be calculated (Unknown ideal weight.).    Allergies  Allergen Reactions   Doxycycline Swelling    Lower lip swelling   Tessalon [Benzonatate] Swelling    Lower lip swelling    Antimicrobials this admission: 9/22 ceftriaxone x1  9/23 vancomycin >>  9/23 cefepime >>   Dose adjustments this admission:   Microbiology results: 9/22 BCx:  9/22 UCx:  9/22 resp panel: Negative      Thank you for allowing pharmacy to be a part of this patient's care.   Royetta Asal, PharmD, BCPS 02/20/2021 10:26 AM

## 2021-02-21 ENCOUNTER — Inpatient Hospital Stay (HOSPITAL_COMMUNITY): Payer: Medicare PPO

## 2021-02-21 DIAGNOSIS — L8990 Pressure ulcer of unspecified site, unspecified stage: Secondary | ICD-10-CM | POA: Insufficient documentation

## 2021-02-21 DIAGNOSIS — N492 Inflammatory disorders of scrotum: Secondary | ICD-10-CM

## 2021-02-21 DIAGNOSIS — R7881 Bacteremia: Principal | ICD-10-CM

## 2021-02-21 DIAGNOSIS — Z515 Encounter for palliative care: Secondary | ICD-10-CM

## 2021-02-21 DIAGNOSIS — B9562 Methicillin resistant Staphylococcus aureus infection as the cause of diseases classified elsewhere: Secondary | ICD-10-CM | POA: Diagnosis not present

## 2021-02-21 DIAGNOSIS — E871 Hypo-osmolality and hyponatremia: Secondary | ICD-10-CM

## 2021-02-21 DIAGNOSIS — F0390 Unspecified dementia without behavioral disturbance: Secondary | ICD-10-CM | POA: Diagnosis not present

## 2021-02-21 DIAGNOSIS — L089 Local infection of the skin and subcutaneous tissue, unspecified: Secondary | ICD-10-CM

## 2021-02-21 DIAGNOSIS — R531 Weakness: Secondary | ICD-10-CM | POA: Diagnosis not present

## 2021-02-21 DIAGNOSIS — N390 Urinary tract infection, site not specified: Secondary | ICD-10-CM | POA: Diagnosis not present

## 2021-02-21 LAB — CBC WITH DIFFERENTIAL/PLATELET
Abs Immature Granulocytes: 0.08 10*3/uL — ABNORMAL HIGH (ref 0.00–0.07)
Basophils Absolute: 0 10*3/uL (ref 0.0–0.1)
Basophils Relative: 0 %
Eosinophils Absolute: 0.1 10*3/uL (ref 0.0–0.5)
Eosinophils Relative: 1 %
HCT: 36.1 % — ABNORMAL LOW (ref 39.0–52.0)
Hemoglobin: 11.3 g/dL — ABNORMAL LOW (ref 13.0–17.0)
Immature Granulocytes: 1 %
Lymphocytes Relative: 16 %
Lymphs Abs: 1.5 10*3/uL (ref 0.7–4.0)
MCH: 30.7 pg (ref 26.0–34.0)
MCHC: 31.3 g/dL (ref 30.0–36.0)
MCV: 98.1 fL (ref 80.0–100.0)
Monocytes Absolute: 1 10*3/uL (ref 0.1–1.0)
Monocytes Relative: 10 %
Neutro Abs: 6.9 10*3/uL (ref 1.7–7.7)
Neutrophils Relative %: 72 %
Platelets: 306 10*3/uL (ref 150–400)
RBC: 3.68 MIL/uL — ABNORMAL LOW (ref 4.22–5.81)
RDW: 13.9 % (ref 11.5–15.5)
WBC: 9.5 10*3/uL (ref 4.0–10.5)
nRBC: 0 % (ref 0.0–0.2)

## 2021-02-21 LAB — BASIC METABOLIC PANEL
Anion gap: 8 (ref 5–15)
BUN: 12 mg/dL (ref 8–23)
CO2: 23 mmol/L (ref 22–32)
Calcium: 8.7 mg/dL — ABNORMAL LOW (ref 8.9–10.3)
Chloride: 107 mmol/L (ref 98–111)
Creatinine, Ser: 0.84 mg/dL (ref 0.61–1.24)
GFR, Estimated: >60 mL/min (ref >60)
Glucose, Bld: 104 mg/dL — ABNORMAL HIGH (ref 70–99)
Potassium: 4.4 mmol/L (ref 3.5–5.1)
Sodium: 138 mmol/L (ref 135–145)

## 2021-02-21 LAB — URINE CULTURE

## 2021-02-21 LAB — MAGNESIUM: Magnesium: 2 mg/dL (ref 1.7–2.4)

## 2021-02-21 LAB — C-REACTIVE PROTEIN: CRP: 6.7 mg/dL — ABNORMAL HIGH (ref <1.0)

## 2021-02-21 MED ORDER — SODIUM CHLORIDE 0.9 % IV SOLN
2.0000 g | Freq: Three times a day (TID) | INTRAVENOUS | Status: DC
  Administered 2021-02-21: 2 g via INTRAVENOUS
  Filled 2021-02-21 (×3): qty 2

## 2021-02-21 MED ORDER — SODIUM CHLORIDE 0.9 % IV SOLN
2.0000 g | Freq: Two times a day (BID) | INTRAVENOUS | Status: DC
  Administered 2021-02-22: 2 g via INTRAVENOUS
  Filled 2021-02-21: qty 2

## 2021-02-21 MED ORDER — IOHEXOL 350 MG/ML SOLN
75.0000 mL | Freq: Once | INTRAVENOUS | Status: AC | PRN
  Administered 2021-02-21: 75 mL via INTRAVENOUS

## 2021-02-21 NOTE — Plan of Care (Signed)
  Problem: Education: Goal: Knowledge of General Education information will improve Description: Including pain rating scale, medication(s)/side effects and non-pharmacologic comfort measures Outcome: Progressing   Problem: Nutrition: Goal: Adequate nutrition will be maintained Outcome: Progressing   Problem: Safety: Goal: Ability to remain free from injury will improve Outcome: Progressing   

## 2021-02-21 NOTE — Evaluation (Signed)
Physical Therapy Evaluation Patient Details Name: Mitchell Abbott MRN: [id number] DOB: 1923-08-09 Today's Date: 02/21/2021  History of Present Illness  87 y.o. male admitted 02/19/21 with generalized weakness, fatigue, fever. Dx of complicated UTI. with medical history significant for dementia, chronic indwelling Foley catheter, history of TIA and skin cancers, BPH  Clinical Impression  Pt admitted with above diagnosis. +2 mod assist for bed mobility. Pt has posterior lean in sitting and standing. +2 mod assist to take a few, shuffling steps to recliner with RW. May need ST-SNF, depending on progress.  Pt currently with functional limitations due to the deficits listed below (see PT Problem List). Pt will benefit from skilled PT to increase their independence and safety with mobility to allow discharge to the venue listed below.          Recommendations for follow up therapy are one component of a multi-disciplinary discharge planning process, led by the attending physician.  Recommendations may be updated based on patient status, additional functional criteria and insurance authorization.  Follow Up Recommendations SNF;Supervision for mobility/OOB (SNF vs. HHPT, depending on progress)    Equipment Recommendations  None recommended by PT    Recommendations for Other Services       Precautions / Restrictions Precautions Precautions: Fall Precaution Comments: fell PTA Restrictions Weight Bearing Restrictions: No      Mobility  Bed Mobility Overal bed mobility: Needs Assistance Bed Mobility: Supine to Sit     Supine to sit: Mod assist;+2 for physical assistance     General bed mobility comments: assist to raise trunk, pivot hips and advance BLEs, posterior lean initially in sitting requiring mod A then Supervision    Transfers Overall transfer level: Needs assistance Equipment used: Rolling walker (2 wheeled) Transfers: Sit to/from [facility] Sit to  Stand: Mod assist;+2 safety/equipment Stand pivot transfers: Mod assist;+2 safety/equipment       General transfer comment: assist to power up, posterior lean  Ambulation/Gait             General Gait Details: attempted ambulation but pt had posterior lean and could only take a couple shuffling steps with RW so pivoted to recliner  Stairs            Wheelchair Mobility    Modified Rankin (Stroke Patients Only)       Balance Overall balance assessment: History of Falls;Needs assistance Sitting-balance support: Feet supported;No upper extremity supported Sitting balance-Leahy Scale: Poor Sitting balance - Comments: mod A for posterior lean initially, then supervision Postural control: Posterior lean Standing balance support: Bilateral upper extremity supported Standing balance-Leahy Scale: Poor Standing balance comment: posterior lean                             Pertinent Vitals/Pain Pain Assessment: Faces Faces Pain Scale: No hurt    Home Living Family/patient expects to be discharged to:: Private residence Living Arrangements: Spouse/significant other Available Help at Discharge: Family;Available 24 hours/day Type of Home: House       Home Layout: Two level Home Equipment: Walker - 2 wheels Additional Comments: Has a caregiver 2 hours in the morning to assist wife with ADLs. Patient goes to daycare 5 x a week.    Prior Function Level of Independence: Needs assistance   Gait / Transfers Assistance Needed: walks with RW  ADL's / Homemaking Assistance Needed: almost total care for all ADLs - can feed himself with increased time and supervision.  Comments:  attending day care per spouse per RN     Hand Dominance   Dominant Hand: Right    Extremity/Trunk Assessment   Upper Extremity Assessment Upper Extremity Assessment: Defer to OT evaluation    Lower Extremity Assessment Lower Extremity Assessment: Generalized weakness     Cervical / Trunk Assessment Cervical / Trunk Assessment: Kyphotic  Communication   Communication: HOH  Cognition Arousal/Alertness: Awake/alert Behavior During Therapy: WFL for tasks assessed/performed Overall Cognitive Status: History of cognitive impairments - at baseline                                 General Comments: non verbal, responds well to wife's commands      General Comments      Exercises     Assessment/Plan    PT Assessment Patient needs continued PT services  PT Problem List Decreased activity tolerance;Decreased balance;Decreased mobility       PT Treatment Interventions Gait training;Therapeutic activities;Therapeutic exercise;Patient/family education;Functional mobility training;Balance training    PT Goals (Current goals can be found in the Care Plan section)  Acute Rehab PT Goals Patient Stated Goal: get strong enough to go home PT Goal Formulation: With family Time For Goal Achievement: 03/07/21 Potential to Achieve Goals: Good    Frequency Min 3X/week   Barriers to discharge        Co-evaluation               AM-PAC PT "6 Clicks" Mobility  Outcome Measure Help needed turning from your back to your side while in a flat bed without using bedrails?: A Lot Help needed moving from lying on your back to sitting on the side of a flat bed without using bedrails?: A Lot Help needed moving to and from a bed to a chair (including a wheelchair)?: A Lot Help needed standing up from a chair using your arms (e.g., wheelchair or bedside chair)?: A Lot Help needed to walk in hospital room?: Total Help needed climbing 3-5 steps with a railing? : Total 6 Click Score: 10    End of Session Equipment Utilized During Treatment: Gait belt Activity Tolerance: Patient tolerated treatment well;No increased pain Patient left: in chair;with call bell/phone within reach;with family/visitor present;with chair alarm set Nurse Communication:  Mobility status PT Visit Diagnosis: Difficulty in walking, not elsewhere classified (R26.2);History of falling (Z91.81);Other abnormalities of gait and mobility (R26.89);Unsteadiness on feet (R26.81)    Time: 2376-2831 PT Time Calculation (min) (ACUTE ONLY): 21 min   Charges:   PT Evaluation $PT Eval Moderate Complexity: 1 Mod         Philomena Doheny PT 02/21/2021  Acute Rehabilitation Services Pager [phone] Office [phone]

## 2021-02-21 NOTE — Consult Note (Signed)
[facility] for Infectious Disease    Date of Admission:  02/19/2021     Reason for Consult: mrsa bacteremia    Referring Provider: auto-champ    Lines:  Peripheral iv's  Abx: 9/23-c vanc 9/23-c cefepime        Assessment: 87 y.o. male pmh dementia, chronic indwelling Foley catheter, BPH, admitted 9/22 with generalized weakness/fatigue found to have mrsa bacteremia, in setting of a skin soft tissue abscess near his scrotum  9/22 bcx mrsa 9/22 ucx in process; ua in setting of foley unhelpful, but clear alternative dx  Suspect presentation related to [place] from scrotal abscess. On exam though I do not see obvious rectal abscess. He appears to be erythematous on the right side toward the perineal area.  Reasonable to get abd/pelv ct (unlikely he'll be still for scrotal u/s)  Plan: Continue vanc and cefepime Get abd/pelv ct with contrast If ct unsuggestive of abscess, can stop cefepime Repeat bcx Tte pending Given quick community onset <1 day prior to abx administration, if he defervesed within 3 days of abx administration, could do 2 week treatment abx Discussed with primary team      ------------------------------------------------ Principal Problem:   Complicated UTI (urinary tract infection) Active Problems:   Dementia ([place])   Generalized weakness   Hyponatremia    HPI: Mitchell Abbott is a 87 y.o. male pmh dementia, chronic indwelling Foley catheter, BPH, admitted 9/22 with generalized weakness/fatigue found to have mrsa bacteremia  Hx via chart and some from patient  Baseline -- patient nonverbal and cared for at adult day care. Patient appeared to be hypoactive on the day of admission and more somnolent as the time goes on. No observed cough, n/v/diarrhea, or incontinent. Had a fever 101.5 at home  He had a small lesion at the scrotum which was evaluated at the [place] 2 days prior to admission, given some abx rx but not filled. Per chart  note, the abscess drained some purulent material when palpated in the [facility] emergency room.    His indwelling foley hasn't been changed for the past month  On presentation had fever to 101.8 with  mild leukocytosis Cxr clear Bcx returned with mrsa He was started on vanc/cefepime It appears his urine catheter was changed 9/22; cx obtained   Today his leukocytosis had improved and the fever curve appears to be doing the same    Family History  Problem Relation Age of Onset   Sudden death Mother 37       FH info obtained from Dr. Ovid Curd records.   Breast cancer Daughter    Migraines Daughter     Social History   Tobacco Use   Smoking status: Former    Years: 2.00    Types: Cigarettes   Smokeless tobacco: Never  Substance Use Topics   Alcohol use: Yes    Alcohol/week: 1.0 standard drink    Types: 1 Cans of beer per week    Comment: "a good drink every day" BEER/WINE   Drug use: No    Allergies  Allergen Reactions   Doxycycline Swelling    Lower lip swelling   Tessalon [Benzonatate] Swelling    Lower lip swelling    Review of Systems: ROS All Other ROS was negative, except mentioned above   Past Medical History:  Diagnosis Date   Actinic keratoses    Adrenal hemorrhage ([place]) 2001   s/p fall from ladder--a f/u CT abd showed resolution  of the hemorrhage- (duodenal polyp incidentally noted;  f/u EGD by Dr. Watt Climes 03/2000 showed small lipomatous mass in duodenum which was confirmed by biopsy.   Alzheimer's dementia ([place])    with behav disturbance and apathy.  [facility] referral as of 07/2013.  They added namenda 07/2014 due to pt's signif decline in the prior 6 mo.   Arthralgia of knee, right 2009   exam suspicious for meniscal origin   BPH (benign prostatic hypertrophy)    History of acute urinary retention (2006)  Hx of elevated PSA (7.26) + right sided prostate nodule on DRE by urologist.  PSA did not go down with tx with antibiotics--biopsy was done and  showed NO MALIGNANCY but some chronic inflammation was present--turned out related to acute cystitis (enterococcus UTI).  PSA was 1.17 in 07/2011 at urologist's (Dr. Karsten Ro).  F/u at urol is now prn as of 08/2014.   BPPV (benign paroxysmal positional vertigo)    COVID-19 virus infection 06/09/2020   DDD (degenerative disc disease), cervical    C5-6, C6-7 primarily   Headache in back of head    Neurologist treated him with PT for this problem and it helped   Hearing deficit    Bilateral; noise damage in [employer]  Has had hearing aids since about 2008.   Herpes zoster 07/25/2018   suspected--valtrex rx'd.   History of acute cystitis 08/2009   with acute prostatitis (enterococcus faecalis)   History of solitary pulmonary nodule    Initially noted on CT abd for his adrenal hemorrhage s/p fall from ladder 2001.  F/u CT chest 04/2000 showed that it had shrunk from 21mm to 42mm and had no malignant features.   History of TIA (transient ischemic attack) @ 2008   02/2001 and 01/2006 carotid dopplers showed 50% or less stenosis.   Left bundle branch hemiblock    mentioned in dx of old records 04/15/2009   Migraine headache with aura onset in teens   No migraines since about 2004   Poor dentition    As of 07/2019 being referred to oral surgeon for removal of all implants and all remaining teeth   RSV bronchitis 01/2020   Skin cancer        Scheduled Meds:  aspirin EC  81 mg Oral Daily   Chlorhexidine Gluconate Cloth  6 each Topical Daily   enoxaparin (LOVENOX) injection  40 mg Subcutaneous Q24H   feeding supplement  1 Container Oral Q24H   feeding supplement  237 mL Oral BID BM   mupirocin cream   Topical BID   tamsulosin  0.4 mg Oral QHS   Continuous Infusions:  ceFEPime (MAXIPIME) IV 2 g (02/21/21 0144)   lactated ringers 75 mL/hr at 02/20/21 0008   vancomycin     PRN Meds:.acetaminophen **OR** acetaminophen   OBJECTIVE: Blood pressure 121/66, pulse 97, temperature 99.5 F (37.5  C), temperature source Axillary, resp. rate 18, height 5\' 9"  (1.753 m), weight 60 kg, SpO2 95 %.  Physical Exam General: No distress, nonverbal, tracks me, allows me to do exam HEENT: Normocephalic, PER, Conj Clear, EOMI, Oropharynx clear Neck supple CV: rrr no mrg Lungs: clear to auscultation, normal respiratory effort Abd: Soft, Nontender Ext: no edema Skin: erythema around perineal/right scrotal; ?tender Gu: slight mucoid discharge around urethral meatus Neuro: stiff/rigid, unable to do full neural exam given his dementia/delirium MSK: no peripheral joint swelling/tenderness/warmth; back spines nontender          Lab Results Lab Results  Component Value  Date   WBC 9.5 02/21/2021   HGB 11.3 (L) 02/21/2021   HCT 36.1 (L) 02/21/2021   MCV 98.1 02/21/2021   PLT 306 02/21/2021    Lab Results  Component Value Date   CREATININE 0.84 02/21/2021   BUN 12 02/21/2021   NA 138 02/21/2021   K 4.4 02/21/2021   CL 107 02/21/2021   CO2 23 02/21/2021    Lab Results  Component Value Date   ALT 22 02/19/2021   AST 20 02/19/2021   ALKPHOS 84 02/19/2021   BILITOT 1.0 02/19/2021      Microbiology: Recent Results (from the past 240 hour(s))  Blood culture (routine single)     Status: Abnormal (Preliminary result)   Collection Time: 02/19/21  9:25 PM   Specimen: BLOOD  Result Value Ref Range Status   Specimen Description   Final    BLOOD SPECIMEN SOURCE NOT MARKED ON REQUISITION Performed at [facility], [place] [address] [place], [place] [address]    Special Requests   Final    BOTTLES DRAWN AEROBIC AND ANAEROBIC Blood Culture adequate volume Performed at [facility] [address], [place], [place] [address]    Culture  Setup Time   Final    GRAM POSITIVE COCCI IN BOTH AEROBIC AND ANAEROBIC BOTTLES CRITICAL RESULT CALLED TO, READ BACK BY AND VERIFIED WITH: PHARMD SCOTT CHRISTY 02/20/21@21 :17 BY TW    Culture (A)  Final     STAPHYLOCOCCUS AUREUS SUSCEPTIBILITIES TO FOLLOW Performed at [facility], [place] [address], [place], [place] [address]    Report Status PENDING  Incomplete  Resp Panel by RT-PCR (Flu A&B, Covid) Nasopharyngeal Swab     Status: None   Collection Time: 02/19/21  9:25 PM   Specimen: Nasopharyngeal Swab; Nasopharyngeal(NP) swabs in vial transport medium  Result Value Ref Range Status   SARS Coronavirus 2 by RT PCR NEGATIVE NEGATIVE Final    Comment: (NOTE) SARS-CoV-2 target nucleic acids are NOT DETECTED.  The SARS-CoV-2 RNA is generally detectable in upper respiratory specimens during the acute phase of infection. The lowest concentration of SARS-CoV-2 viral copies this assay can detect is 138 copies/mL. A negative result does not preclude SARS-Cov-2 infection and should not be used as the sole basis for treatment or other patient management decisions. A negative result may occur with  improper specimen collection/handling, submission of specimen other than nasopharyngeal swab, presence of viral mutation(s) within the areas targeted by this assay, and inadequate number of viral copies(<138 copies/mL). A negative result must be combined with clinical observations, patient history, and epidemiological information. The expected result is Negative.  Fact Sheet for Patients:  EntrepreneurPulse.com.au  Fact Sheet for Healthcare Providers:  IncredibleEmployment.be  This test is no t yet approved or cleared by the [place] FDA and  has been authorized for detection and/or diagnosis of SARS-CoV-2 by FDA under an Emergency Use Authorization (EUA). This EUA will remain  in effect (meaning this test can be used) for the duration of the COVID-19 declaration under Section 564(b)(1) of the Act, 21 U.S.C.section 360bbb-3(b)(1), unless the authorization is terminated  or revoked sooner.       Influenza A by PCR NEGATIVE NEGATIVE Final   Influenza B  by PCR NEGATIVE NEGATIVE Final    Comment: (NOTE) The Xpert Xpress SARS-CoV-2/FLU/RSV plus assay is intended as an aid in the diagnosis of influenza from Nasopharyngeal swab specimens and should not be used as a sole basis for treatment. Nasal washings and  aspirates are unacceptable for Xpert Xpress SARS-CoV-2/FLU/RSV testing.  Fact Sheet for Patients: EntrepreneurPulse.com.au  Fact Sheet for Healthcare Providers: IncredibleEmployment.be  This test is not yet approved or cleared by the [place] FDA and has been authorized for detection and/or diagnosis of SARS-CoV-2 by FDA under an Emergency Use Authorization (EUA). This EUA will remain in effect (meaning this test can be used) for the duration of the COVID-19 declaration under Section 564(b)(1) of the Act, 21 U.S.C. section 360bbb-3(b)(1), unless the authorization is terminated or revoked.  Performed at [facility], [place] [address] [place], [place] [address]   Blood Culture ID Panel (Reflexed)     Status: Abnormal   Collection Time: 02/19/21  9:25 PM  Result Value Ref Range Status   Enterococcus faecalis NOT DETECTED NOT DETECTED Final   Enterococcus Faecium NOT DETECTED NOT DETECTED Final   Listeria monocytogenes NOT DETECTED NOT DETECTED Final   Staphylococcus species DETECTED (A) NOT DETECTED Final    Comment: CRITICAL RESULT CALLED TO, READ BACK BY AND VERIFIED WITH: PHARMD SCOTT CHRISTY 02/20/21@21 :17 BY TW    Staphylococcus aureus (BCID) DETECTED (A) NOT DETECTED Final    Comment: Methicillin (oxacillin)-resistant Staphylococcus aureus (MRSA). MRSA is predictably resistant to beta-lactam antibiotics (except ceftaroline). Preferred therapy is vancomycin unless clinically contraindicated. Patient requires contact precautions if  hospitalized. CRITICAL RESULT CALLED TO, READ BACK BY AND VERIFIED WITH: PHARMD SCOTT CHRISTY 02/20/21@21 :17 BY TW    Staphylococcus  epidermidis NOT DETECTED NOT DETECTED Final   Staphylococcus lugdunensis NOT DETECTED NOT DETECTED Final   Streptococcus species NOT DETECTED NOT DETECTED Final   Streptococcus agalactiae NOT DETECTED NOT DETECTED Final   Streptococcus pneumoniae NOT DETECTED NOT DETECTED Final   Streptococcus pyogenes NOT DETECTED NOT DETECTED Final   A.calcoaceticus-baumannii NOT DETECTED NOT DETECTED Final   Bacteroides fragilis NOT DETECTED NOT DETECTED Final   Enterobacterales NOT DETECTED NOT DETECTED Final   Enterobacter cloacae complex NOT DETECTED NOT DETECTED Final   Escherichia coli NOT DETECTED NOT DETECTED Final   Klebsiella aerogenes NOT DETECTED NOT DETECTED Final   Klebsiella oxytoca NOT DETECTED NOT DETECTED Final   Klebsiella pneumoniae NOT DETECTED NOT DETECTED Final   Proteus species NOT DETECTED NOT DETECTED Final   Salmonella species NOT DETECTED NOT DETECTED Final   Serratia marcescens NOT DETECTED NOT DETECTED Final   Haemophilus influenzae NOT DETECTED NOT DETECTED Final   Neisseria meningitidis NOT DETECTED NOT DETECTED Final   Pseudomonas aeruginosa NOT DETECTED NOT DETECTED Final   Stenotrophomonas maltophilia NOT DETECTED NOT DETECTED Final   Candida albicans NOT DETECTED NOT DETECTED Final   Candida auris NOT DETECTED NOT DETECTED Final   Candida glabrata NOT DETECTED NOT DETECTED Final   Candida krusei NOT DETECTED NOT DETECTED Final   Candida parapsilosis NOT DETECTED NOT DETECTED Final   Candida tropicalis NOT DETECTED NOT DETECTED Final   Cryptococcus neoformans/gattii NOT DETECTED NOT DETECTED Final   Meth resistant mecA/C and MREJ DETECTED (A) NOT DETECTED Final    Comment: CRITICAL RESULT CALLED TO, READ BACK BY AND VERIFIED WITH: PHARMD SCOTT CHRISTY 02/20/21@21 :17 BY TW Performed at [facility], [address], [place], [place] [address]      Serology:    Imaging: If present, new imagings (plain films, ct scans, and mri) have been personally  visualized and interpreted; radiology reports have been reviewed. Decision making incorporated into the Impression / Recommendations.  9/22 cxr No acute abnormality noted  Jabier Mutton, [place] for Infectious Disease  [facility] [phone] pager    02/21/2021, 10:12 AM

## 2021-02-21 NOTE — Progress Notes (Signed)
PHARMACY NOTE:  ANTIMICROBIAL RENAL DOSAGE ADJUSTMENT  Current antimicrobial regimen includes a mismatch between antimicrobial dosage and estimated renal function.  As per policy approved by the Pharmacy & Therapeutics and Medical Executive Committees, the antimicrobial dosage will be adjusted accordingly.  Current antimicrobial dosage:  Cefepime 2 g IV q8h  Indication: SSTI concern for abscess  Renal Function:  Estimated Creatinine Clearance: 45.6 mL/min (by C-G formula based on SCr of 0.84 mg/dL). []      On intermittent HD, scheduled: []      On CRRT    Antimicrobial dosage has been changed to:  cefepime 2 g IV q12h  Thank you for allowing pharmacy to be a part of this patient's care.  Lenis Noon, [facility] 02/21/2021 7:32 PM

## 2021-02-21 NOTE — Progress Notes (Signed)
Patient ID: Mitchell Abbott, male   DOB: 12-Dec-1923, 87 y.o.   MRN: [id number]  PROGRESS NOTE    SHALIK SANFILIPPO  PJA:[id number] DOB: 30-Apr-1924 DOA: 02/19/2021 PCP: Tammi Sou, MD   Brief Narrative:   87 y.o. male with medical history significant for dementia, chronic indwelling Foley catheter, history of TIA and skin cancers, BPH who presented with generalized weakness and worsening fatigue along with fever.  On presentation, WBC was 10,400.  UA was suggestive of UTI.  He was started on broad-spectrum antibiotics.  Assessment & Plan:   Complicated UTI in a patient with chronic indwelling Foley catheter: Possibly related to Foley catheter Scrotal/groin cellulitis MRSA bacteremia -Foley catheter changed on presentation.  Currently on cefepime and vancomycin.  Urine culture had grown Pseudomonas in the past -Blood cultures growing Staph aureus.  BCID showing MRSA. -Repeat a.m. blood cultures -Patient is spiking temperatures.  We will get CT of abdomen and pelvis to make sure patient does not have abscess -Decrease IV fluids to 50 cc an hour. -Continue Flomax  Generalized weakness Dementia -PT eval -Palliative care consultation for goals of care discussion since patient now has MRSA bacteremia and might need long-term antibiotics  Leukocytosis -Resolved  Anemia of chronic disease -Possibly from chronic illnesses.  Hemoglobin stable  Hyponatremia -Resolved  DVT prophylaxis: Lovenox Code Status: DNR Family Communication: Spoke to wife at bedside on 02/20/2021 Disposition Plan: Status is: Inpatient  Remains inpatient appropriate because:Inpatient level of care appropriate due to severity of illness  Dispo: The patient is from: Home              Anticipated d/c is to: Home              Patient currently is not medically stable to d/c.   Difficult to place patient No  Consultants: None  Procedures: None  Antimicrobials:  Anti-infectives (From admission,  onward)    Start     Dose/Rate Route Frequency Ordered Stop   02/21/21 1000  vancomycin (VANCOREADY) IVPB 750 mg/150 mL        750 mg 150 mL/hr over 60 Minutes Intravenous Every 24 hours 02/20/21 1029     02/20/21 1200  cefTRIAXone (ROCEPHIN) 1 g in sodium chloride 0.9 % 100 mL IVPB  Status:  Discontinued        1 g 200 mL/hr over 30 Minutes Intravenous Every 24 hours 02/20/21 0001 02/20/21 0002   02/20/21 1200  vancomycin (VANCOCIN) IVPB 1000 mg/200 mL premix        1,000 mg 200 mL/hr over 60 Minutes Intravenous  Once 02/20/21 1029 02/20/21 1244   02/20/21 0200  ceFEPIme (MAXIPIME) 2 g in sodium chloride 0.9 % 100 mL IVPB        2 g 200 mL/hr over 30 Minutes Intravenous Every 12 hours 02/20/21 0002     02/19/21 2230  cefTRIAXone (ROCEPHIN) 1 g in sodium chloride 0.9 % 100 mL IVPB        1 g 200 mL/hr over 30 Minutes Intravenous  Once 02/19/21 2218 02/19/21 2257        Subjective: Patient seen and examined at bedside.  We will subsequently, hardly answers any questions.  Confused.  Had temperature spike overnight.  No vomiting reported.  Objective: Vitals:   02/20/21 1501 02/20/21 1847 02/20/21 2008 02/20/21 2246  BP: (!) 170/96 (!) 120/46 121/66   Pulse: 90 94 97   Resp: 18 18 18    Temp:  (!) 101.1 F (38.4 C) (!)  100.9 F (38.3 C) 99.5 F (37.5 C)  TempSrc:  Oral Axillary Axillary  SpO2: 100% 96% 95%   Weight:      Height:        Intake/Output Summary (Last 24 hours) at 02/21/2021 1009 Last data filed at 02/21/2021 0336 Gross per 24 hour  Intake 1020 ml  Output 1500 ml  Net -480 ml   Filed Weights   02/20/21 1100 02/20/21 1157  Weight: 60 kg 60 kg    Examination:  General exam: Elderly male lying in bed.  Chronically ill and deconditioned.  Currently on room air. Respiratory system: Bilateral decreased breath sounds at bases Cardiovascular system: S1 & S2 heard, Rate controlled Gastrointestinal system: Abdomen is nondistended, soft and nontender. Normal  bowel sounds heard. Extremities: No cyanosis, clubbing, edema  Central nervous system: Awake, alert participates in any conversation, confused.  No focal neurological deficits. Moving extremities Skin: No obvious ecchymosis/petechiae Scrotum: Scrotal erythema/swelling and tenderness present psychiatry: Could not be assessed because of mental status  Data Reviewed: I have personally reviewed following labs and imaging studies  CBC: Recent Labs  Lab 02/19/21 2125 02/20/21 0334 02/21/21 0318  WBC 10.4 12.1* 9.5  NEUTROABS 8.0*  --  6.9  HGB 11.3* 11.8* 11.3*  HCT 35.0* 36.4* 36.1*  MCV 97.0 96.0 98.1  PLT 332 339 950   Basic Metabolic Panel: Recent Labs  Lab 02/19/21 2125 02/20/21 0334 02/21/21 0318  NA 133* 136 138  K 4.1 4.3 4.4  CL 100 103 107  CO2 22 21* 23  GLUCOSE 110* 135* 104*  BUN 15 14 12   CREATININE 0.82 0.69 0.84  CALCIUM 8.3* 8.5* 8.7*  MG  --   --  2.0   GFR: Estimated Creatinine Clearance: 45.6 mL/min (by C-G formula based on SCr of 0.84 mg/dL). Liver Function Tests: Recent Labs  Lab 02/19/21 2125  AST 20  ALT 22  ALKPHOS 84  BILITOT 1.0  PROT 6.8  ALBUMIN 2.8*   No results for input(s): LIPASE, AMYLASE in the last 168 hours. No results for input(s): AMMONIA in the last 168 hours. Coagulation Profile: Recent Labs  Lab 02/19/21 2125  INR 1.1   Cardiac Enzymes: No results for input(s): CKTOTAL, CKMB, CKMBINDEX, TROPONINI in the last 168 hours. BNP (last 3 results) No results for input(s): PROBNP in the last 8760 hours. HbA1C: No results for input(s): HGBA1C in the last 72 hours. CBG: No results for input(s): GLUCAP in the last 168 hours. Lipid Profile: No results for input(s): CHOL, HDL, LDLCALC, TRIG, CHOLHDL, LDLDIRECT in the last 72 hours. Thyroid Function Tests: No results for input(s): TSH, T4TOTAL, FREET4, T3FREE, THYROIDAB in the last 72 hours. Anemia Panel: No results for input(s): VITAMINB12, FOLATE, FERRITIN, TIBC, IRON,  RETICCTPCT in the last 72 hours. Sepsis Labs: Recent Labs  Lab 02/19/21 2125  LATICACIDVEN 1.3    Recent Results (from the past 240 hour(s))  Blood culture (routine single)     Status: Abnormal (Preliminary result)   Collection Time: 02/19/21  9:25 PM   Specimen: BLOOD  Result Value Ref Range Status   Specimen Description   Final    BLOOD SPECIMEN SOURCE NOT MARKED ON REQUISITION Performed at [facility], [place] [address] [place], [place] [address]    Special Requests   Final    BOTTLES DRAWN AEROBIC AND ANAEROBIC Blood Culture adequate volume Performed at [facility] [address] [place], [place] [address]    Culture  Setup Time  Final    GRAM POSITIVE COCCI IN BOTH AEROBIC AND ANAEROBIC BOTTLES CRITICAL RESULT CALLED TO, READ BACK BY AND VERIFIED WITH: PHARMD SCOTT CHRISTY 02/20/21@21 :17 BY TW    Culture (A)  Final    STAPHYLOCOCCUS AUREUS SUSCEPTIBILITIES TO FOLLOW Performed at [facility], [place] [address] [place], [place] [address]    Report Status PENDING  Incomplete  Resp Panel by RT-PCR (Flu A&B, Covid) Nasopharyngeal Swab     Status: None   Collection Time: 02/19/21  9:25 PM   Specimen: Nasopharyngeal Swab; Nasopharyngeal(NP) swabs in vial transport medium  Result Value Ref Range Status   SARS Coronavirus 2 by RT PCR NEGATIVE NEGATIVE Final    Comment: (NOTE) SARS-CoV-2 target nucleic acids are NOT DETECTED.  The SARS-CoV-2 RNA is generally detectable in upper respiratory specimens during the acute phase of infection. The lowest concentration of SARS-CoV-2 viral copies this assay can detect is 138 copies/mL. A negative result does not preclude SARS-Cov-2 infection and should not be used as the sole basis for treatment or other patient management decisions. A negative result may occur with  improper specimen collection/handling, submission of specimen other than nasopharyngeal swab, presence of viral mutation(s)  within the areas targeted by this assay, and inadequate number of viral copies(<138 copies/mL). A negative result must be combined with clinical observations, patient history, and epidemiological information. The expected result is Negative.  Fact Sheet for Patients:  EntrepreneurPulse.com.au  Fact Sheet for Healthcare Providers:  IncredibleEmployment.be  This test is no t yet approved or cleared by the [place] FDA and  has been authorized for detection and/or diagnosis of SARS-CoV-2 by FDA under an Emergency Use Authorization (EUA). This EUA will remain  in effect (meaning this test can be used) for the duration of the COVID-19 declaration under Section 564(b)(1) of the Act, 21 U.S.C.section 360bbb-3(b)(1), unless the authorization is terminated  or revoked sooner.       Influenza A by PCR NEGATIVE NEGATIVE Final   Influenza B by PCR NEGATIVE NEGATIVE Final    Comment: (NOTE) The Xpert Xpress SARS-CoV-2/FLU/RSV plus assay is intended as an aid in the diagnosis of influenza from Nasopharyngeal swab specimens and should not be used as a sole basis for treatment. Nasal washings and aspirates are unacceptable for Xpert Xpress SARS-CoV-2/FLU/RSV testing.  Fact Sheet for Patients: EntrepreneurPulse.com.au  Fact Sheet for Healthcare Providers: IncredibleEmployment.be  This test is not yet approved or cleared by the [place] FDA and has been authorized for detection and/or diagnosis of SARS-CoV-2 by FDA under an Emergency Use Authorization (EUA). This EUA will remain in effect (meaning this test can be used) for the duration of the COVID-19 declaration under Section 564(b)(1) of the Act, 21 U.S.C. section 360bbb-3(b)(1), unless the authorization is terminated or revoked.  Performed at [facility], [place] [address] [place], [place] [address]   Blood Culture ID Panel (Reflexed)      Status: Abnormal   Collection Time: 02/19/21  9:25 PM  Result Value Ref Range Status   Enterococcus faecalis NOT DETECTED NOT DETECTED Final   Enterococcus Faecium NOT DETECTED NOT DETECTED Final   Listeria monocytogenes NOT DETECTED NOT DETECTED Final   Staphylococcus species DETECTED (A) NOT DETECTED Final    Comment: CRITICAL RESULT CALLED TO, READ BACK BY AND VERIFIED WITH: PHARMD SCOTT CHRISTY 02/20/21@21 :17 BY TW    Staphylococcus aureus (BCID) DETECTED (A) NOT DETECTED Final    Comment: Methicillin (oxacillin)-resistant Staphylococcus aureus (MRSA). MRSA is predictably resistant to beta-lactam  antibiotics (except ceftaroline). Preferred therapy is vancomycin unless clinically contraindicated. Patient requires contact precautions if  hospitalized. CRITICAL RESULT CALLED TO, READ BACK BY AND VERIFIED WITH: PHARMD SCOTT CHRISTY 02/20/21@21 :17 BY TW    Staphylococcus epidermidis NOT DETECTED NOT DETECTED Final   Staphylococcus lugdunensis NOT DETECTED NOT DETECTED Final   Streptococcus species NOT DETECTED NOT DETECTED Final   Streptococcus agalactiae NOT DETECTED NOT DETECTED Final   Streptococcus pneumoniae NOT DETECTED NOT DETECTED Final   Streptococcus pyogenes NOT DETECTED NOT DETECTED Final   A.calcoaceticus-baumannii NOT DETECTED NOT DETECTED Final   Bacteroides fragilis NOT DETECTED NOT DETECTED Final   Enterobacterales NOT DETECTED NOT DETECTED Final   Enterobacter cloacae complex NOT DETECTED NOT DETECTED Final   Escherichia coli NOT DETECTED NOT DETECTED Final   Klebsiella aerogenes NOT DETECTED NOT DETECTED Final   Klebsiella oxytoca NOT DETECTED NOT DETECTED Final   Klebsiella pneumoniae NOT DETECTED NOT DETECTED Final   Proteus species NOT DETECTED NOT DETECTED Final   Salmonella species NOT DETECTED NOT DETECTED Final   Serratia marcescens NOT DETECTED NOT DETECTED Final   Haemophilus influenzae NOT DETECTED NOT DETECTED Final   Neisseria meningitidis NOT DETECTED  NOT DETECTED Final   Pseudomonas aeruginosa NOT DETECTED NOT DETECTED Final   Stenotrophomonas maltophilia NOT DETECTED NOT DETECTED Final   Candida albicans NOT DETECTED NOT DETECTED Final   Candida auris NOT DETECTED NOT DETECTED Final   Candida glabrata NOT DETECTED NOT DETECTED Final   Candida krusei NOT DETECTED NOT DETECTED Final   Candida parapsilosis NOT DETECTED NOT DETECTED Final   Candida tropicalis NOT DETECTED NOT DETECTED Final   Cryptococcus neoformans/gattii NOT DETECTED NOT DETECTED Final   Meth resistant mecA/C and MREJ DETECTED (A) NOT DETECTED Final    Comment: CRITICAL RESULT CALLED TO, READ BACK BY AND VERIFIED WITH: PHARMD SCOTT CHRISTY 02/20/21@21 :17 BY TW Performed at [facility], [address] [place], [place] [address]          Radiology Studies: DG Chest Port 1 View  Result Date: 02/19/2021 CLINICAL DATA:  Fevers and possible sepsis EXAM: PORTABLE CHEST 1 VIEW COMPARISON:  01/11/2021 FINDINGS: Cardiac shadow is stable. The lungs are clear bilaterally. No focal infiltrate or effusion is noted. No bony abnormality is seen. Mild aortic calcifications are noted. IMPRESSION: No acute abnormality noted. Electronically Signed   By: Inez Catalina M.D.   On: 02/19/2021 21:09        Scheduled Meds:  aspirin EC  81 mg Oral Daily   Chlorhexidine Gluconate Cloth  6 each Topical Daily   enoxaparin (LOVENOX) injection  40 mg Subcutaneous Q24H   feeding supplement  1 Container Oral Q24H   feeding supplement  237 mL Oral BID BM   mupirocin cream   Topical BID   tamsulosin  0.4 mg Oral QHS   Continuous Infusions:  ceFEPime (MAXIPIME) IV 2 g (02/21/21 0144)   lactated ringers 75 mL/hr at 02/20/21 0008   vancomycin            Aline August, MD [facility] 02/21/2021, 10:09 AM

## 2021-02-21 NOTE — Progress Notes (Signed)
PT Cancellation Note  Patient Details Name: ERMAL HABERER MRN: [id number] DOB: 13-Apr-1924   Cancelled Treatment:    Reason Eval/Treat Not Completed: Patient at procedure or test/unavailable (pt is about to leave for [place]. Will check back this afternoon.)   Philomena Doheny PT 02/21/2021  Acute Rehabilitation Services Pager [phone] Office [phone]

## 2021-02-22 ENCOUNTER — Inpatient Hospital Stay (HOSPITAL_COMMUNITY): Payer: Medicare PPO

## 2021-02-22 DIAGNOSIS — R7881 Bacteremia: Secondary | ICD-10-CM

## 2021-02-22 DIAGNOSIS — N390 Urinary tract infection, site not specified: Secondary | ICD-10-CM | POA: Diagnosis not present

## 2021-02-22 DIAGNOSIS — E871 Hypo-osmolality and hyponatremia: Secondary | ICD-10-CM | POA: Diagnosis not present

## 2021-02-22 DIAGNOSIS — R531 Weakness: Secondary | ICD-10-CM | POA: Diagnosis not present

## 2021-02-22 DIAGNOSIS — F0390 Unspecified dementia without behavioral disturbance: Secondary | ICD-10-CM | POA: Diagnosis not present

## 2021-02-22 LAB — CBC WITH DIFFERENTIAL/PLATELET
Abs Immature Granulocytes: 0.04 10*3/uL (ref 0.00–0.07)
Basophils Absolute: 0 10*3/uL (ref 0.0–0.1)
Basophils Relative: 0 %
Eosinophils Absolute: 0.1 10*3/uL (ref 0.0–0.5)
Eosinophils Relative: 2 %
HCT: 33.9 % — ABNORMAL LOW (ref 39.0–52.0)
Hemoglobin: 10.8 g/dL — ABNORMAL LOW (ref 13.0–17.0)
Immature Granulocytes: 1 %
Lymphocytes Relative: 21 %
Lymphs Abs: 1.6 10*3/uL (ref 0.7–4.0)
MCH: 31.2 pg (ref 26.0–34.0)
MCHC: 31.9 g/dL (ref 30.0–36.0)
MCV: 98 fL (ref 80.0–100.0)
Monocytes Absolute: 1.1 10*3/uL — ABNORMAL HIGH (ref 0.1–1.0)
Monocytes Relative: 14 %
Neutro Abs: 4.9 10*3/uL (ref 1.7–7.7)
Neutrophils Relative %: 62 %
Platelets: 325 10*3/uL (ref 150–400)
RBC: 3.46 MIL/uL — ABNORMAL LOW (ref 4.22–5.81)
RDW: 13.9 % (ref 11.5–15.5)
WBC: 7.8 10*3/uL (ref 4.0–10.5)
nRBC: 0 % (ref 0.0–0.2)

## 2021-02-22 LAB — ECHOCARDIOGRAM COMPLETE
Area-P 1/2: 2.94 cm2
Height: 69 in
S' Lateral: 2.7 cm
Weight: 2116.42 oz

## 2021-02-22 LAB — BASIC METABOLIC PANEL
Anion gap: 7 (ref 5–15)
BUN: 16 mg/dL (ref 8–23)
CO2: 25 mmol/L (ref 22–32)
Calcium: 8.5 mg/dL — ABNORMAL LOW (ref 8.9–10.3)
Chloride: 108 mmol/L (ref 98–111)
Creatinine, Ser: 0.81 mg/dL (ref 0.61–1.24)
GFR, Estimated: >60 mL/min (ref >60)
Glucose, Bld: 94 mg/dL (ref 70–99)
Potassium: 4.5 mmol/L (ref 3.5–5.1)
Sodium: 140 mmol/L (ref 135–145)

## 2021-02-22 LAB — MAGNESIUM: Magnesium: 2.1 mg/dL (ref 1.7–2.4)

## 2021-02-22 LAB — C-REACTIVE PROTEIN: CRP: 4.7 mg/dL — ABNORMAL HIGH (ref <1.0)

## 2021-02-22 NOTE — Progress Notes (Signed)
Id brief note  Ct no soft tissue abscess   Continue vanc Stop cefepime

## 2021-02-22 NOTE — Progress Notes (Addendum)
Patient ID: Mitchell Abbott, male   DOB: 10/08/1923, 87 y.o.   MRN: [id number]  PROGRESS NOTE    Mitchell Abbott  HKV:[id number] DOB: [birth year] DOA: 02/19/2021 PCP: Tammi Sou, MD   Brief Narrative:   87 y.o. male with medical history significant for dementia, chronic indwelling Foley catheter, history of TIA and skin cancers, BPH who presented with generalized weakness and worsening fatigue along with fever.  On presentation, WBC was 10,400.  UA was suggestive of UTI.  He was started on broad-spectrum antibiotics.  Assessment & Plan:   Complicated UTI in a patient with chronic indwelling Foley catheter: Possibly related to Foley catheter Scrotal/groin cellulitis MRSA bacteremia -Foley catheter changed on presentation.  Currently on cefepime and vancomycin.  Urine culture had grown Pseudomonas in the past -Blood cultures growing Staph aureus.  BCID showing MRSA.  Urine culture growing multiple species.  Repeat blood culture for today. -Follow 2D echo. -ID following.   -No temperature spikes over the last 24 hours.  CT of the abdomen and pelvis did not show evidence of abscess. -DC IV fluids -Continue Flomax  Generalized weakness Dementia -PT recommended SNF placement.  Social worker consult. -Palliative care consultation for goals of care discussion since patient now has MRSA bacteremia and might need long-term antibiotics  Leukocytosis -Resolved  Anemia of chronic disease -Possibly from chronic illnesses.  Hemoglobin stable  Hyponatremia -Resolved  DVT prophylaxis: Lovenox Code Status: DNR Family Communication: Spoke to wife at bedside on 02/22/2021 Disposition Plan: Status is: Inpatient  Remains inpatient appropriate because:Inpatient level of care appropriate due to severity of illness  Dispo: The patient is from: Home              Anticipated d/c is to: Home              Patient currently is not medically stable to d/c.   Difficult to place patient  No  Consultants: ID  Procedures: None  Antimicrobials:  Anti-infectives (From admission, onward)    Start     Dose/Rate Route Frequency Ordered Stop   02/22/21 0600  ceFEPIme (MAXIPIME) 2 g in sodium chloride 0.9 % 100 mL IVPB        2 g 200 mL/hr over 30 Minutes Intravenous Every 12 hours 02/21/21 1931     02/21/21 1400  ceFEPIme (MAXIPIME) 2 g in sodium chloride 0.9 % 100 mL IVPB  Status:  Discontinued        2 g 200 mL/hr over 30 Minutes Intravenous Every 8 hours 02/21/21 1130 02/21/21 1931   02/21/21 1000  vancomycin (VANCOREADY) IVPB 750 mg/150 mL        750 mg 150 mL/hr over 60 Minutes Intravenous Every 24 hours 02/20/21 1029     02/20/21 1200  cefTRIAXone (ROCEPHIN) 1 g in sodium chloride 0.9 % 100 mL IVPB  Status:  Discontinued        1 g 200 mL/hr over 30 Minutes Intravenous Every 24 hours 02/20/21 0001 02/20/21 0002   02/20/21 1200  vancomycin (VANCOCIN) IVPB 1000 mg/200 mL premix        1,000 mg 200 mL/hr over 60 Minutes Intravenous  Once 02/20/21 1029 02/20/21 1244   02/20/21 0200  ceFEPIme (MAXIPIME) 2 g in sodium chloride 0.9 % 100 mL IVPB  Status:  Discontinued        2 g 200 mL/hr over 30 Minutes Intravenous Every 12 hours 02/20/21 0002 02/21/21 1025   02/19/21 2230  cefTRIAXone (ROCEPHIN) 1 g in sodium  chloride 0.9 % 100 mL IVPB        1 g 200 mL/hr over 30 Minutes Intravenous  Once 02/19/21 2218 02/19/21 2257        Subjective: Patient seen and examined at bedside.  Confused.  Wakes up slightly, hardly answers any questions.  No overnight fever, vomiting or agitation reported. Objective: Vitals:   02/20/21 2246 02/21/21 1349 02/21/21 2233 02/22/21 0450  BP:  (!) 120/96 136/71 139/70  Pulse:  87 72 67  Resp:   19 16  Temp: 99.5 F (37.5 C) 99.2 F (37.3 C) 99 F (37.2 C) 98.8 F (37.1 C)  TempSrc: Axillary Oral Axillary Oral  SpO2:  98% 97% 97%  Weight:      Height:        Intake/Output Summary (Last 24 hours) at 02/22/2021 0750 Last data filed  at 02/22/2021 0600 Gross per 24 hour  Intake 2911.81 ml  Output 1230 ml  Net 1681.81 ml    Filed Weights   02/20/21 1100 02/20/21 1157  Weight: 60 kg 60 kg    Examination:  General exam: Elderly male lying in bed.  Currently on room air.  Looks chronically ill and deconditioned.  No distress.  Respiratory system: Decreased breath sounds at bases with scattered crackles  cardiovascular system: Rate controlled; S1-S2 heard Gastrointestinal system: Abdomen is distended, soft and nontender.  Bowel sounds heard Extremities: No edema or clubbing Central nervous system: Wakes up slightly, hardly participates in any conversation.  Confused.  No focal neurological deficits.  Moves extremities  skin: No obvious petechiae/ecchymosis scrotum: Scrotal erythema/swelling and tenderness present psychiatry: Cannot assess because of mental status  Data Reviewed: I have personally reviewed following labs and imaging studies  CBC: Recent Labs  Lab 02/19/21 2125 02/20/21 0334 02/21/21 0318 02/22/21 0256  WBC 10.4 12.1* 9.5 7.8  NEUTROABS 8.0*  --  6.9 4.9  HGB 11.3* 11.8* 11.3* 10.8*  HCT 35.0* 36.4* 36.1* 33.9*  MCV 97.0 96.0 98.1 98.0  PLT 332 339 306 638    Basic Metabolic Panel: Recent Labs  Lab 02/19/21 2125 02/20/21 0334 02/21/21 0318 02/22/21 0256  NA 133* 136 138 140  K 4.1 4.3 4.4 4.5  CL 100 103 107 108  CO2 22 21* 23 25  GLUCOSE 110* 135* 104* 94  BUN 15 14 12 16   CREATININE 0.82 0.69 0.84 0.81  CALCIUM 8.3* 8.5* 8.7* 8.5*  MG  --   --  2.0 2.1    GFR: Estimated Creatinine Clearance: 47.3 mL/min (by C-G formula based on SCr of 0.81 mg/dL). Liver Function Tests: Recent Labs  Lab 02/19/21 2125  AST 20  ALT 22  ALKPHOS 84  BILITOT 1.0  PROT 6.8  ALBUMIN 2.8*    No results for input(s): LIPASE, AMYLASE in the last 168 hours. No results for input(s): AMMONIA in the last 168 hours. Coagulation Profile: Recent Labs  Lab 02/19/21 2125  INR 1.1    Cardiac  Enzymes: No results for input(s): CKTOTAL, CKMB, CKMBINDEX, TROPONINI in the last 168 hours. BNP (last 3 results) No results for input(s): PROBNP in the last 8760 hours. HbA1C: No results for input(s): HGBA1C in the last 72 hours. CBG: No results for input(s): GLUCAP in the last 168 hours. Lipid Profile: No results for input(s): CHOL, HDL, LDLCALC, TRIG, CHOLHDL, LDLDIRECT in the last 72 hours. Thyroid Function Tests: No results for input(s): TSH, T4TOTAL, FREET4, T3FREE, THYROIDAB in the last 72 hours. Anemia Panel: No results for input(s): VITAMINB12, FOLATE,  FERRITIN, TIBC, IRON, RETICCTPCT in the last 72 hours. Sepsis Labs: Recent Labs  Lab 02/19/21 2125  LATICACIDVEN 1.3     Recent Results (from the past 240 hour(s))  Blood culture (routine single)     Status: Abnormal (Preliminary result)   Collection Time: 02/19/21  9:25 PM   Specimen: BLOOD  Result Value Ref Range Status   Specimen Description   Final    BLOOD SPECIMEN SOURCE NOT MARKED ON REQUISITION Performed at [facility], [place] [address], [place], [place] [address]    Special Requests   Final    BOTTLES DRAWN AEROBIC AND ANAEROBIC Blood Culture adequate volume Performed at [facility] [address] [place], [place] [address]    Culture  Setup Time   Final    GRAM POSITIVE COCCI IN BOTH AEROBIC AND ANAEROBIC BOTTLES CRITICAL RESULT CALLED TO, READ BACK BY AND VERIFIED WITH: PHARMD SCOTT CHRISTY 02/20/21@21 :17 BY TW    Culture (A)  Final    STAPHYLOCOCCUS AUREUS SUSCEPTIBILITIES TO FOLLOW Performed at [facility], [place] [address] [place], [place] [address]    Report Status PENDING  Incomplete  Urine Culture     Status: Abnormal   Collection Time: 02/19/21  9:25 PM   Specimen: In/Out Cath Urine  Result Value Ref Range Status   Specimen Description   Final    IN/OUT CATH URINE Performed at [facility] [address], [place], [place]  [address]    Special Requests   Final    NONE Performed at [facility], [place] [address] [place], [place] [address]    Culture MULTIPLE SPECIES PRESENT, SUGGEST RECOLLECTION (A)  Final   Report Status 02/21/2021 FINAL  Final  Resp Panel by RT-PCR (Flu A&B, Covid) Nasopharyngeal Swab     Status: None   Collection Time: 02/19/21  9:25 PM   Specimen: Nasopharyngeal Swab; Nasopharyngeal(NP) swabs in vial transport medium  Result Value Ref Range Status   SARS Coronavirus 2 by RT PCR NEGATIVE NEGATIVE Final    Comment: (NOTE) SARS-CoV-2 target nucleic acids are NOT DETECTED.  The SARS-CoV-2 RNA is generally detectable in upper respiratory specimens during the acute phase of infection. The lowest concentration of SARS-CoV-2 viral copies this assay can detect is 138 copies/mL. A negative result does not preclude SARS-Cov-2 infection and should not be used as the sole basis for treatment or other patient management decisions. A negative result may occur with  improper specimen collection/handling, submission of specimen other than nasopharyngeal swab, presence of viral mutation(s) within the areas targeted by this assay, and inadequate number of viral copies(<138 copies/mL). A negative result must be combined with clinical observations, patient history, and epidemiological information. The expected result is Negative.  Fact Sheet for Patients:  EntrepreneurPulse.com.au  Fact Sheet for Healthcare Providers:  IncredibleEmployment.be  This test is no t yet approved or cleared by the [place] FDA and  has been authorized for detection and/or diagnosis of SARS-CoV-2 by FDA under an Emergency Use Authorization (EUA). This EUA will remain  in effect (meaning this test can be used) for the duration of the COVID-19 declaration under Section 564(b)(1) of the Act, 21 U.S.C.section 360bbb-3(b)(1), unless the authorization is terminated  or  revoked sooner.       Influenza A by PCR NEGATIVE NEGATIVE Final   Influenza B by PCR NEGATIVE NEGATIVE Final    Comment: (NOTE) The Xpert Xpress SARS-CoV-2/FLU/RSV plus assay is intended as an aid in  the diagnosis of influenza from Nasopharyngeal swab specimens and should not be used as a sole basis for treatment. Nasal washings and aspirates are unacceptable for Xpert Xpress SARS-CoV-2/FLU/RSV testing.  Fact Sheet for Patients: EntrepreneurPulse.com.au  Fact Sheet for Healthcare Providers: IncredibleEmployment.be  This test is not yet approved or cleared by the [place] FDA and has been authorized for detection and/or diagnosis of SARS-CoV-2 by FDA under an Emergency Use Authorization (EUA). This EUA will remain in effect (meaning this test can be used) for the duration of the COVID-19 declaration under Section 564(b)(1) of the Act, 21 U.S.C. section 360bbb-3(b)(1), unless the authorization is terminated or revoked.  Performed at [facility], [place] [address], [place], [place] [address]   Blood Culture ID Panel (Reflexed)     Status: Abnormal   Collection Time: 02/19/21  9:25 PM  Result Value Ref Range Status   Enterococcus faecalis NOT DETECTED NOT DETECTED Final   Enterococcus Faecium NOT DETECTED NOT DETECTED Final   Listeria monocytogenes NOT DETECTED NOT DETECTED Final   Staphylococcus species DETECTED (A) NOT DETECTED Final    Comment: CRITICAL RESULT CALLED TO, READ BACK BY AND VERIFIED WITH: PHARMD SCOTT CHRISTY 02/20/21@21 :17 BY TW    Staphylococcus aureus (BCID) DETECTED (A) NOT DETECTED Final    Comment: Methicillin (oxacillin)-resistant Staphylococcus aureus (MRSA). MRSA is predictably resistant to beta-lactam antibiotics (except ceftaroline). Preferred therapy is vancomycin unless clinically contraindicated. Patient requires contact precautions if  hospitalized. CRITICAL RESULT CALLED TO, READ BACK BY  AND VERIFIED WITH: PHARMD SCOTT CHRISTY 02/20/21@21 :17 BY TW    Staphylococcus epidermidis NOT DETECTED NOT DETECTED Final   Staphylococcus lugdunensis NOT DETECTED NOT DETECTED Final   Streptococcus species NOT DETECTED NOT DETECTED Final   Streptococcus agalactiae NOT DETECTED NOT DETECTED Final   Streptococcus pneumoniae NOT DETECTED NOT DETECTED Final   Streptococcus pyogenes NOT DETECTED NOT DETECTED Final   A.calcoaceticus-baumannii NOT DETECTED NOT DETECTED Final   Bacteroides fragilis NOT DETECTED NOT DETECTED Final   Enterobacterales NOT DETECTED NOT DETECTED Final   Enterobacter cloacae complex NOT DETECTED NOT DETECTED Final   Escherichia coli NOT DETECTED NOT DETECTED Final   Klebsiella aerogenes NOT DETECTED NOT DETECTED Final   Klebsiella oxytoca NOT DETECTED NOT DETECTED Final   Klebsiella pneumoniae NOT DETECTED NOT DETECTED Final   Proteus species NOT DETECTED NOT DETECTED Final   Salmonella species NOT DETECTED NOT DETECTED Final   Serratia marcescens NOT DETECTED NOT DETECTED Final   Haemophilus influenzae NOT DETECTED NOT DETECTED Final   Neisseria meningitidis NOT DETECTED NOT DETECTED Final   Pseudomonas aeruginosa NOT DETECTED NOT DETECTED Final   Stenotrophomonas maltophilia NOT DETECTED NOT DETECTED Final   Candida albicans NOT DETECTED NOT DETECTED Final   Candida auris NOT DETECTED NOT DETECTED Final   Candida glabrata NOT DETECTED NOT DETECTED Final   Candida krusei NOT DETECTED NOT DETECTED Final   Candida parapsilosis NOT DETECTED NOT DETECTED Final   Candida tropicalis NOT DETECTED NOT DETECTED Final   Cryptococcus neoformans/gattii NOT DETECTED NOT DETECTED Final   Meth resistant mecA/C and MREJ DETECTED (A) NOT DETECTED Final    Comment: CRITICAL RESULT CALLED TO, READ BACK BY AND VERIFIED WITH: PHARMD SCOTT CHRISTY 02/20/21@21 :17 BY TW Performed at [facility], [address] [place], [place] [address]           Radiology Studies: CT  ABDOMEN PELVIS W CONTRAST  Result Date: 02/21/2021 CLINICAL DATA:  Soft tissue abscess near the scrotum. MRSA bacteremia. EXAM: CT  ABDOMEN AND PELVIS WITH CONTRAST TECHNIQUE: Multidetector CT imaging of the abdomen and pelvis was performed using the standard protocol following bolus administration of intravenous contrast. CONTRAST:  29mL OMNIPAQUE IOHEXOL 350 MG/ML SOLN COMPARISON:  None. FINDINGS: Lower chest: Mild dependent opacity at the posterior lung bases, greater on the left, consistent with atelectasis. Trace left pleural effusion. Trace pericardial effusion. No convincing acute abnormality. Hepatobiliary: Liver normal in size and attenuation. No masses. Several calcifications consistent with healed granuloma. Normal gallbladder. No bile duct dilation. Pancreas: Unremarkable. No pancreatic ductal dilatation or surrounding inflammatory changes. Spleen: Normal size. Scattered calcifications consistent with healed granuloma. No masses. Adrenals/Urinary Tract: 1.7 cm right adrenal mass. No significant washout between the initial and delayed post-contrast sequences. Mass is nonspecific. Normal left adrenal gland. Mild bilateral renal cortical thinning. Symmetric renal enhancement and excretion. Subcentimeter low-density lesion, posterior lower pole of the left kidney, consistent with a cyst. No other renal masses, no stones and no hydronephrosis. Normal ureters. Bladder minimally distended, Foley catheter at the bladder base. There is mild bladder wall thickening and non dependent air. Stomach/Bowel: Normal stomach. Small bowel and colon are normal in caliber. No wall thickening. No inflammation. Numerous colonic diverticula without diverticulitis. No evidence of appendicitis. Vascular/Lymphatic: Significant aortic atherosclerosis. No aneurysm. No enlarged lymph nodes. Reproductive: Enlarged prostate, 5.9 x 5.5 x 5.8 cm. Other: Subcutaneous soft tissue inflammatory change along the inferior margins of both  buttocks. No soft tissue abscess. No abdominal wall hernia. No ascites. Musculoskeletal: No fracture or acute finding.  No bone lesion. IMPRESSION: 1. No evidence of an abscess. Perineal region subcutaneous inflammatory changes. 2. Bladder wall mildly thickened, which may be due to lack of distension possibly from chronic bladder outlet obstruction due to prostate enlargement. Consider cystitis in the proper clinical setting. 3. No other evidence of an acute abnormality within the abdomen or pelvis. 4. 1.7 cm indeterminate right adrenal mass. In this age group, specific follow-up is not recommended unless center is a history of a primary malignancy. 5. Aortic atherosclerosis. 6. Prostate enlargement. Electronically Signed   By: Lajean Manes M.D.   On: 02/21/2021 13:23        Scheduled Meds:  aspirin EC  81 mg Oral Daily   Chlorhexidine Gluconate Cloth  6 each Topical Daily   enoxaparin (LOVENOX) injection  40 mg Subcutaneous Q24H   feeding supplement  1 Container Oral Q24H   feeding supplement  237 mL Oral BID BM   mupirocin cream   Topical BID   tamsulosin  0.4 mg Oral QHS   Continuous Infusions:  ceFEPime (MAXIPIME) IV 2 g (02/22/21 0530)   lactated ringers 75 mL/hr at 02/20/21 0008   vancomycin 750 mg (02/21/21 1014)          Aline August, MD [facility] 02/22/2021, 7:50 AM

## 2021-02-22 NOTE — Progress Notes (Signed)
Echocardiogram 2D Echocardiogram has been performed.  Oneal Deputy Kherington Meraz RDCS 02/22/2021, 8:45 AM

## 2021-02-22 NOTE — Plan of Care (Signed)
  Problem: Education: Goal: Knowledge of General Education information will improve Description: Including pain rating scale, medication(s)/side effects and non-pharmacologic comfort measures Outcome: Progressing   Problem: Activity: Goal: Risk for activity intolerance will decrease Outcome: Progressing   Problem: Nutrition: Goal: Adequate nutrition will be maintained Outcome: Progressing   

## 2021-02-22 NOTE — Consult Note (Signed)
Consultation Note Date: 02/22/2021   Patient Name: Mitchell Abbott  DOB: 1924/03/02  MRN: [id number]  Age / Sex: 87 y.o., male  PCP: Tammi Sou, MD Referring Physician: Aline August, MD  Reason for Consultation: Establishing goals of care  HPI/Patient Profile: 87 y.o. male  with past medical history of dementia, chronic indwelling Foley catheter, TIA, skin cancer, BPH admitted on 02/19/2021 with fatigue and fever.  He was seen recently for soft tissue abscess near scrotum and this admission was found to have MSRA bacteremia.  He is being followed by infectious disease with recommendation for 2-week treatment of antibiotics.  He did have CT performed that showed no evidence of abscess.  Palliative consulted for goals of care.  Clinical Assessment and Goals of Care: Palliative care consult received.  Chart reviewed including personal review of pertinent labs and imaging.  I saw and examined Mitchell Abbott today.  He was sitting in bedside chair no distress.  I introduced palliative care as specialized medical care for people living with serious illness. It focuses on providing relief from the symptoms and stress of a serious illness. The goal is to improve quality of life for both the patient and the family.  I attempted to engage Mitchell Abbott in conversation, however he is nonverbal at baseline and was not able to participate in conversation regarding goals of care.  Staff reports that his wife was here earlier today but she is not present at the bedside at this time.  I did not reach her via phone.  I reviewed available medical records including review of advance care planning documents in epic.  He does have documents scanned in under ACP tab, however, it appears to me that these are actually his wife's documents rather than his.  I was not able to meet with her today to discuss further.  SUMMARY  OF RECOMMENDATIONS - DNR/DNI - Mitchell Abbott is nonverbal and not able to participate in goals of care conversation secondary to advanced dementia.  Unfortunately, I missed meeting with his wife in person and did not reach her via phone today. - Continue current care.  Noted that he requires significant care and goes to daycare program at baseline. - Plan for follow-up tomorrow for family meeting when patient's wife is present  Code Status/Advance Care Planning: DNR  Prognosis:  Unable to determine  Discharge Planning: To Be Determined      Primary Diagnoses: Present on Admission:  Complicated UTI (urinary tract infection)  Dementia ([place])   I have reviewed the medical record, interviewed the patient and family, and examined the patient. The following aspects are pertinent.  Past Medical History:  Diagnosis Date   Actinic keratoses    Adrenal hemorrhage ([place]) 2001   s/p fall from ladder--a f/u CT abd showed resolution of the hemorrhage- (duodenal polyp incidentally noted;  f/u EGD by Dr. Watt Climes 03/2000 showed small lipomatous mass in duodenum which was confirmed by biopsy.   Alzheimer's dementia ([place])    with behav disturbance and apathy.  [facility] referral as of 07/2013.  They added namenda 07/2014 due to pt's signif decline in the prior 6 mo.   Arthralgia of knee, right 2009   exam suspicious for meniscal origin   BPH (benign prostatic hypertrophy)    History of acute urinary retention (2006)  Hx of elevated PSA (7.26) + right sided prostate nodule on DRE by urologist.  PSA did not go down with tx with antibiotics--biopsy was done and showed NO MALIGNANCY but some chronic inflammation was present--turned out related to acute cystitis (enterococcus UTI).  PSA was 1.17 in 07/2011 at urologist's (Dr. Karsten Ro).  F/u at urol is now prn as of 08/2014.   BPPV (benign paroxysmal positional vertigo)    COVID-19 virus infection 06/09/2020   DDD (degenerative disc disease), cervical     C5-6, C6-7 primarily   Headache in back of head    Neurologist treated him with PT for this problem and it helped   Hearing deficit    Bilateral; noise damage in [employer]  Has had hearing aids since about 2008.   Herpes zoster 07/25/2018   suspected--valtrex rx'd.   History of acute cystitis 08/2009   with acute prostatitis (enterococcus faecalis)   History of solitary pulmonary nodule    Initially noted on CT abd for his adrenal hemorrhage s/p fall from ladder 2001.  F/u CT chest 04/2000 showed that it had shrunk from 42mm to 63mm and had no malignant features.   History of TIA (transient ischemic attack) @ 2008   02/2001 and 01/2006 carotid dopplers showed 50% or less stenosis.   Left bundle branch hemiblock    mentioned in dx of old records 04/15/2009   Migraine headache with aura onset in teens   No migraines since about 2004   Poor dentition    As of 07/2019 being referred to oral surgeon for removal of all implants and all remaining teeth   RSV bronchitis 01/2020   Skin cancer    Social History   Socioeconomic History   Marital status: Married    Spouse name: Not on file   Number of children: 4   Years of education: PHD   Highest education level: Not on file  Occupational History   Not on file  Tobacco Use   Smoking status: Former    Years: 2.00    Types: Cigarettes   Smokeless tobacco: Never  Substance and Sexual Activity   Alcohol use: Yes    Alcohol/week: 1.0 standard drink    Types: 1 Cans of beer per week    Comment: "a good drink every day" BEER/WINE   Drug use: No   Sexual activity: Not on file  Other Topics Concern   Not on file  Social History Narrative   Married x 30+ yrs, retired Equities trader Geophysical data processor), 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   Lives in [place].   Orig from [place], relocated to [place] in late 1970s.   Has 3 children from first marriage, one from second marriage.   One daughter is an OB/GYN in  [place], [place].   Distant hx of smoking cigs, quit 1974, drinks a whisky drink qod.   Exercise: 3 X /week goes to "the club" and does nautilus and walking machine, also swims 3 x/week.   Social Determinants of Health   Financial Resource Strain: Not on file  Food Insecurity: Not on file  Transportation Needs: Not on file  Physical Activity: Not  on file  Stress: Not on file  Social Connections: Not on file   Family History  Problem Relation Age of Onset   Sudden death Mother 41       FH info obtained from Dr. Ovid Curd records.   Breast cancer Daughter    Migraines Daughter    Scheduled Meds:  aspirin EC  81 mg Oral Daily   Chlorhexidine Gluconate Cloth  6 each Topical Daily   enoxaparin (LOVENOX) injection  40 mg Subcutaneous Q24H   feeding supplement  1 Container Oral Q24H   feeding supplement  237 mL Oral BID BM   mupirocin cream   Topical BID   tamsulosin  0.4 mg Oral QHS   Continuous Infusions:  ceFEPime (MAXIPIME) IV 2 g (02/22/21 0530)   vancomycin 750 mg (02/22/21 0933)   PRN Meds:.acetaminophen **OR** acetaminophen Medications Prior to Admission:  Prior to Admission medications   Medication Sig Start Date End Date Taking? Authorizing Provider  acetaminophen (TYLENOL) 500 MG tablet Take 1,000 mg by mouth every 6 (six) hours as needed for fever.   Yes [provider]  aspirin 325 MG EC tablet Take 1 tablet (325 mg total) by mouth daily. Patient taking differently: Take 162.5 mg by mouth daily. 10/20/12  Yes Marcial Pacas, MD  tamsulosin (FLOMAX) 0.4 MG CAPS capsule Take 2 capsules (0.8 mg total) by mouth daily after supper. Patient taking differently: Take 0.4 mg by mouth at bedtime. 08/11/16  Yes McGowen, Adrian Blackwater, MD  Vitamins A & D (VITAMIN A & D) ointment Apply 1 application topically 2 (two) times daily. After using toilet   Yes [provider]  rivastigmine (EXELON) 13.3 MG/24HR Place 13.3 mg onto the skin daily. Patient not taking: No sig reported     [provider]   Allergies  Allergen Reactions   Doxycycline Swelling    Lower lip swelling   Tessalon [Benzonatate] Swelling    Lower lip swelling   Review of Systems Unable to obtain  Physical Exam General: Alert, awake, in no acute distress.  Nonverbal.  Looks chronically ill/deconditioned. HEENT: No bruits, no goiter, no JVD Heart: Regular rate and rhythm. No murmur appreciated. Lungs: Good air movement, clear Abdomen: Soft, nontender, nondistended, positive bowel sounds.   Ext: No significant edema Skin: Warm and dry.  Scrotal area not examined  Vital Signs: BP 139/70 (BP Location: Left Arm)   Pulse 67   Temp 98.8 F (37.1 C) (Oral)   Resp 16   Ht 5\' 9"  (1.753 m) Comment: estimate  Wt 60 kg   SpO2 97%   BMI 19.53 kg/m  Pain Scale: Faces POSS *See Group Information*: 1-Acceptable,Awake and alert Pain Score: 0-No pain   SpO2: SpO2: 97 % O2 Device:SpO2: 97 % O2 Flow Rate: .   IO: Intake/output summary:  Intake/Output Summary (Last 24 hours) at 02/22/2021 1152 Last data filed at 02/22/2021 1025 Gross per 24 hour  Intake 2611.81 ml  Output 1680 ml  Net 931.81 ml    LBM: Last BM Date: 02/18/21 Baseline Weight: Weight: 60 kg Most recent weight: Weight: 60 kg     Palliative Assessment/Data:   Flowsheet Rows    Flowsheet Row Most Recent Value  Intake Tab   Referral Department Hospitalist  Unit at Time of Referral Med/Surg Unit  Palliative Care Primary Diagnosis Sepsis/Infectious Disease  Date Notified 02/21/21  Palliative Care Type New Palliative care  Reason for referral Clarify Goals of Care  Date of Admission 02/19/21  Date first seen  by Palliative Care 02/21/21  # of days Palliative referral response time 0 Day(s)  # of days IP prior to Palliative referral 2  Clinical Assessment   Palliative Performance Scale Score 30%  Psychosocial & Spiritual Assessment   Palliative Care Outcomes   Patient/Family meeting held? No       Time In:  1820 Time Out: 1900 Time Total: 40 minutes Greater than 50%  of this time was spent counseling and coordinating care related to the above assessment and plan.  Signed by: Micheline Rough, MD   Please contact Palliative Medicine Team phone at [phone] for questions and concerns.  For individual provider: See Shea Evans

## 2021-02-23 ENCOUNTER — Other Ambulatory Visit (HOSPITAL_COMMUNITY): Payer: Self-pay

## 2021-02-23 DIAGNOSIS — R7881 Bacteremia: Secondary | ICD-10-CM | POA: Diagnosis not present

## 2021-02-23 DIAGNOSIS — B9562 Methicillin resistant Staphylococcus aureus infection as the cause of diseases classified elsewhere: Secondary | ICD-10-CM | POA: Diagnosis not present

## 2021-02-23 DIAGNOSIS — F0390 Unspecified dementia without behavioral disturbance: Secondary | ICD-10-CM | POA: Diagnosis not present

## 2021-02-23 DIAGNOSIS — N390 Urinary tract infection, site not specified: Secondary | ICD-10-CM | POA: Diagnosis not present

## 2021-02-23 DIAGNOSIS — Z7189 Other specified counseling: Secondary | ICD-10-CM

## 2021-02-23 DIAGNOSIS — Z515 Encounter for palliative care: Secondary | ICD-10-CM | POA: Diagnosis not present

## 2021-02-23 LAB — CBC WITH DIFFERENTIAL/PLATELET
Abs Immature Granulocytes: 0.03 10*3/uL (ref 0.00–0.07)
Basophils Absolute: 0.1 10*3/uL (ref 0.0–0.1)
Basophils Relative: 1 %
Eosinophils Absolute: 0.2 10*3/uL (ref 0.0–0.5)
Eosinophils Relative: 2 %
HCT: 35.7 % — ABNORMAL LOW (ref 39.0–52.0)
Hemoglobin: 11.6 g/dL — ABNORMAL LOW (ref 13.0–17.0)
Immature Granulocytes: 0 %
Lymphocytes Relative: 22 %
Lymphs Abs: 1.6 10*3/uL (ref 0.7–4.0)
MCH: 31.1 pg (ref 26.0–34.0)
MCHC: 32.5 g/dL (ref 30.0–36.0)
MCV: 95.7 fL (ref 80.0–100.0)
Monocytes Absolute: 0.7 10*3/uL (ref 0.1–1.0)
Monocytes Relative: 9 %
Neutro Abs: 4.5 10*3/uL (ref 1.7–7.7)
Neutrophils Relative %: 66 %
Platelets: 350 10*3/uL (ref 150–400)
RBC: 3.73 MIL/uL — ABNORMAL LOW (ref 4.22–5.81)
RDW: 14 % (ref 11.5–15.5)
WBC: 7 10*3/uL (ref 4.0–10.5)
nRBC: 0 % (ref 0.0–0.2)

## 2021-02-23 LAB — BASIC METABOLIC PANEL
Anion gap: 5 (ref 5–15)
BUN: 14 mg/dL (ref 8–23)
CO2: 24 mmol/L (ref 22–32)
Calcium: 8.7 mg/dL — ABNORMAL LOW (ref 8.9–10.3)
Chloride: 110 mmol/L (ref 98–111)
Creatinine, Ser: 0.66 mg/dL (ref 0.61–1.24)
GFR, Estimated: >60 mL/min (ref >60)
Glucose, Bld: 122 mg/dL — ABNORMAL HIGH (ref 70–99)
Potassium: 3.9 mmol/L (ref 3.5–5.1)
Sodium: 139 mmol/L (ref 135–145)

## 2021-02-23 LAB — CULTURE, BLOOD (SINGLE): Special Requests: ADEQUATE

## 2021-02-23 LAB — C-REACTIVE PROTEIN: CRP: 3 mg/dL — ABNORMAL HIGH (ref <1.0)

## 2021-02-23 LAB — MAGNESIUM: Magnesium: 2.1 mg/dL (ref 1.7–2.4)

## 2021-02-23 MED ORDER — FOOD THICKENER (SIMPLYTHICK)
1.0000 | ORAL | Status: DC | PRN
  Filled 2021-02-23: qty 2
  Filled 2021-02-23 (×3): qty 1
  Filled 2021-02-23: qty 2

## 2021-02-23 MED ORDER — GERHARDT'S BUTT CREAM
TOPICAL_CREAM | Freq: Two times a day (BID) | CUTANEOUS | Status: DC
  Filled 2021-02-23: qty 1

## 2021-02-23 MED ORDER — MUPIROCIN 2 % EX OINT
1.0000 | TOPICAL_OINTMENT | Freq: Two times a day (BID) | CUTANEOUS | Status: DC
Start: 2021-02-23 — End: 2021-02-25
  Administered 2021-02-23 – 2021-02-25 (×4): 1 via NASAL
  Filled 2021-02-23: qty 22

## 2021-02-23 NOTE — Progress Notes (Signed)
Physical Therapy Treatment Patient Details Name: Mitchell Abbott MRN: [id number] DOB: 08-20-1923 Today's Date: 02/23/2021   History of Present Illness 87 y.o. male admitted 02/19/21 with generalized weakness, fatigue, fever. Dx of complicated UTI. with medical history significant for dementia, chronic indwelling Foley catheter, history of TIA and skin cancers, BPH    PT Comments    Pt was OOB in recliner.  + 2 to scoot back upright as pt tends to hip thrust and slide down recliner.  Spouse present during session and very helpful.  Pt responds well to his wife.  She assisted Therapist with walking.  General transfer comment: spouse demonstared she she stands in front of pt and blocks his feet from "skiing" with hers and pulls him up by his arms.  Pt responded well to this tech.  Initial posterior lean/push.  Once upright, pt was able to support his own weight and stand partial erect. General transfer comment: spouse demonstared she she stands in front of pt and blocks his feet from "skiing" with hers and pulls him up by his arms.  Pt responded well to this tech.  Initial posterior lean/push.  Once upright, pt was able to support his own weight and stand partial erect. Positioned up in recliner with multiple pillows.  Spouse would like Therapy to return later to amb again is schedule permits.    Recommendations for follow up therapy are one component of a multi-disciplinary discharge planning process, led by the attending physician.  Recommendations may be updated based on patient status, additional functional criteria and insurance authorization.  Follow Up Recommendations  SNF     Equipment Recommendations       Recommendations for Other Services       Precautions / Restrictions Precautions Precautions: Fall Precaution Comments: Alzheimer's dementia (non verbal)     Mobility  Bed Mobility               General bed mobility comments: OOB in recliner    Transfers Overall  transfer level: Needs assistance Equipment used: Rolling walker (2 wheeled);None Transfers: Sit to/from Stand Sit to Stand: Mod assist;+2 safety/equipment         General transfer comment: spouse demonstared she she stands in front of pt and blocks his feet from "skiing" with hers and pulls him up by his arms.  Pt responded well to this tech.  Initial posterior lean/push.  Once upright, pt was able to support his own weight and stand partial erect.  Ambulation/Gait Ambulation/Gait assistance: Min assist;+2 physical assistance;+2 safety/equipment Gait Distance (Feet): 55 Feet Assistive device: Rolling walker (2 wheeled) Gait Pattern/deviations: Step-to pattern;Step-through pattern;Narrow base of support;Ataxic;Festinating;Shuffle Gait velocity: decreased   General transfer comment: spouse demonstared she she stands in front of pt and blocks his feet from "skiing" with hers and pulls him up by his arms.  Pt responded well to this tech.  Initial posterior lean/push.  Once upright, pt was able to support his own weight and stand partial erect.   Stairs             Wheelchair Mobility    Modified Rankin (Stroke Patients Only)       Balance                                            Cognition Arousal/Alertness: Awake/alert Behavior During Therapy: WFL for tasks assessed/performed Overall Cognitive Status: History  of cognitive impairments - at baseline                                 General Comments: non verbal, responds well to wife's commands      Exercises      General Comments        Pertinent Vitals/Pain Pain Assessment: Faces Faces Pain Scale: No hurt    Home Living                      Prior Function            PT Goals (current goals can now be found in the care plan section) Progress towards PT goals: Progressing toward goals    Frequency    Min 3X/week      PT Plan Current plan remains  appropriate    Co-evaluation              AM-PAC PT "6 Clicks" Mobility   Outcome Measure  Help needed turning from your back to your side while in a flat bed without using bedrails?: A Lot Help needed moving from lying on your back to sitting on the side of a flat bed without using bedrails?: A Lot Help needed moving to and from a bed to a chair (including a wheelchair)?: A Lot Help needed standing up from a chair using your arms (e.g., wheelchair or bedside chair)?: A Lot Help needed to walk in hospital room?: A Lot Help needed climbing 3-5 steps with a railing? : Total 6 Click Score: 11    End of Session Equipment Utilized During Treatment: Gait belt Activity Tolerance: Patient tolerated treatment well;No increased pain Patient left: in chair;with call bell/phone within reach;with family/visitor present;with chair alarm set Nurse Communication: Mobility status PT Visit Diagnosis: Difficulty in walking, not elsewhere classified (R26.2);History of falling (Z91.81);Other abnormalities of gait and mobility (R26.89);Unsteadiness on feet (R26.81)     Time: 1100-1117 PT Time Calculation (min) (ACUTE ONLY): 17 min  Charges:  $Gait Training: 8-22 mins                     Rica Koyanagi  PTA Acute  Rehabilitation Services Pager      [phone] Office      [phone]

## 2021-02-23 NOTE — Progress Notes (Signed)
Patient ID: Mitchell Abbott, male   DOB: 08/23/1923, 87 y.o.   MRN: [id number]  PROGRESS NOTE    Mitchell Abbott  FAO:[id number] DOB: 03-14-1924 DOA: 02/19/2021 PCP: Tammi Sou, MD   Brief Narrative:   87 y.o. male with medical history significant for dementia, chronic indwelling Foley catheter, history of TIA and skin cancers, BPH who presented with generalized weakness and worsening fatigue along with fever.  On presentation, WBC was 10,400.  UA was suggestive of UTI.  He was started on broad-spectrum antibiotics.  Assessment & Plan:   Complicated UTI in a patient with chronic indwelling Foley catheter: Possibly related to Foley catheter Scrotal/groin cellulitis MRSA bacteremia -Foley catheter changed on presentation.  Currently on vancomycin.  Urine culture had grown Pseudomonas in the past -Blood cultures growing MRSA.  Urine culture growing multiple species.  Follow repeat blood cultures from 06/20/2020. -2D echo showed no vegetations. -ID following: DC'd cefepime on 02/22/2021.   -No temperature spikes over the last 48 hours.  CT of the abdomen and pelvis did not show evidence of abscess. -Off IV fluids -Continue Flomax  Generalized weakness Dementia -PT recommended SNF placement.  Social worker consult. -Palliative care consultation for goals of care discussion since patient now has MRSA bacteremia and might need long-term antibiotics  Leukocytosis -Resolved.  No labs today  Anemia of chronic disease -Possibly from chronic illnesses.  Hemoglobin stable  Hyponatremia -Resolved  DVT prophylaxis: Lovenox Code Status: DNR Family Communication: Spoke to wife at bedside on 02/23/2021 Disposition Plan: Status is: Inpatient  Remains inpatient appropriate because:Inpatient level of care appropriate due to severity of illness  Dispo: The patient is from: Home              Anticipated d/c is to: Home              Patient currently is not medically stable to d/c.    Difficult to place patient No  Consultants: ID  Procedures: Echo as below  Antimicrobials:  Anti-infectives (From admission, onward)    Start     Dose/Rate Route Frequency Ordered Stop   02/22/21 0600  ceFEPIme (MAXIPIME) 2 g in sodium chloride 0.9 % 100 mL IVPB  Status:  Discontinued        2 g 200 mL/hr over 30 Minutes Intravenous Every 12 hours 02/21/21 1931 02/22/21 1433   02/21/21 1400  ceFEPIme (MAXIPIME) 2 g in sodium chloride 0.9 % 100 mL IVPB  Status:  Discontinued        2 g 200 mL/hr over 30 Minutes Intravenous Every 8 hours 02/21/21 1130 02/21/21 1931   02/21/21 1000  vancomycin (VANCOREADY) IVPB 750 mg/150 mL        750 mg 150 mL/hr over 60 Minutes Intravenous Every 24 hours 02/20/21 1029     02/20/21 1200  cefTRIAXone (ROCEPHIN) 1 g in sodium chloride 0.9 % 100 mL IVPB  Status:  Discontinued        1 g 200 mL/hr over 30 Minutes Intravenous Every 24 hours 02/20/21 0001 02/20/21 0002   02/20/21 1200  vancomycin (VANCOCIN) IVPB 1000 mg/200 mL premix        1,000 mg 200 mL/hr over 60 Minutes Intravenous  Once 02/20/21 1029 02/20/21 1244   02/20/21 0200  ceFEPIme (MAXIPIME) 2 g in sodium chloride 0.9 % 100 mL IVPB  Status:  Discontinued        2 g 200 mL/hr over 30 Minutes Intravenous Every 12 hours 02/20/21 0002 02/21/21 1025  02/19/21 2230  cefTRIAXone (ROCEPHIN) 1 g in sodium chloride 0.9 % 100 mL IVPB        1 g 200 mL/hr over 30 Minutes Intravenous  Once 02/19/21 2218 02/19/21 2257        Subjective: Patient seen and examined at bedside.  Confused.  No agitation, vomiting, fever reported  .  objective: Vitals:   02/22/21 0450 02/22/21 1401 02/22/21 2156 02/23/21 0524  BP: 139/70 128/68 118/63 127/64  Pulse: 67 72 76 67  Resp: 16 17 17 17   Temp: 98.8 F (37.1 C) 98.3 F (36.8 C) 98.7 F (37.1 C) 98.6 F (37 C)  TempSrc: Oral  Oral Oral  SpO2: 97% 96% 98% 98%  Weight:      Height:        Intake/Output Summary (Last 24 hours) at 02/23/2021 0738 Last  data filed at 02/22/2021 2156 Gross per 24 hour  Intake 774.91 ml  Output 1650 ml  Net -875.09 ml    Filed Weights   02/20/21 1100 02/20/21 1157  Weight: 60 kg 60 kg    Examination:  General exam: Elderly male sitting on chair.  Looks chronically ill and deconditioned.  On room air currently.  No acute distress.   Respiratory system: Bilateral decreased breath sounds at bases cardiovascular system: S1-S2 heard, rate controlled  gastrointestinal system: Abdomen is mildly distended, soft and nontender.  Normal bowel sounds heard  extremities: No cyanosis or clubbing  Central nervous system: Awake, confused.  Does not participate in conversation.  No focal neurological deficits.  Moving extremities  skin: No obvious ecchymosis/lesions noted  psychiatry: Could not be assessed because of mental status  Data Reviewed: I have personally reviewed following labs and imaging studies  CBC: Recent Labs  Lab 02/19/21 2125 02/20/21 0334 02/21/21 0318 02/22/21 0256  WBC 10.4 12.1* 9.5 7.8  NEUTROABS 8.0*  --  6.9 4.9  HGB 11.3* 11.8* 11.3* 10.8*  HCT 35.0* 36.4* 36.1* 33.9*  MCV 97.0 96.0 98.1 98.0  PLT 332 339 306 751    Basic Metabolic Panel: Recent Labs  Lab 02/19/21 2125 02/20/21 0334 02/21/21 0318 02/22/21 0256  NA 133* 136 138 140  K 4.1 4.3 4.4 4.5  CL 100 103 107 108  CO2 22 21* 23 25  GLUCOSE 110* 135* 104* 94  BUN 15 14 12 16   CREATININE 0.82 0.69 0.84 0.81  CALCIUM 8.3* 8.5* 8.7* 8.5*  MG  --   --  2.0 2.1    GFR: Estimated Creatinine Clearance: 47.3 mL/min (by C-G formula based on SCr of 0.81 mg/dL). Liver Function Tests: Recent Labs  Lab 02/19/21 2125  AST 20  ALT 22  ALKPHOS 84  BILITOT 1.0  PROT 6.8  ALBUMIN 2.8*    No results for input(s): LIPASE, AMYLASE in the last 168 hours. No results for input(s): AMMONIA in the last 168 hours. Coagulation Profile: Recent Labs  Lab 02/19/21 2125  INR 1.1    Cardiac Enzymes: No results for input(s):  CKTOTAL, CKMB, CKMBINDEX, TROPONINI in the last 168 hours. BNP (last 3 results) No results for input(s): PROBNP in the last 8760 hours. HbA1C: No results for input(s): HGBA1C in the last 72 hours. CBG: No results for input(s): GLUCAP in the last 168 hours. Lipid Profile: No results for input(s): CHOL, HDL, LDLCALC, TRIG, CHOLHDL, LDLDIRECT in the last 72 hours. Thyroid Function Tests: No results for input(s): TSH, T4TOTAL, FREET4, T3FREE, THYROIDAB in the last 72 hours. Anemia Panel: No results for input(s): VITAMINB12,  FOLATE, FERRITIN, TIBC, IRON, RETICCTPCT in the last 72 hours. Sepsis Labs: Recent Labs  Lab 02/19/21 2125  LATICACIDVEN 1.3     Recent Results (from the past 240 hour(s))  Blood culture (routine single)     Status: Abnormal (Preliminary result)   Collection Time: 02/19/21  9:25 PM   Specimen: BLOOD  Result Value Ref Range Status   Specimen Description   Final    BLOOD SPECIMEN SOURCE NOT MARKED ON REQUISITION Performed at [facility], [place] [address], [place], [place] [address]    Special Requests   Final    BOTTLES DRAWN AEROBIC AND ANAEROBIC Blood Culture adequate volume Performed at [facility] [address], [place], [place] [address]    Culture  Setup Time   Final    GRAM POSITIVE COCCI IN BOTH AEROBIC AND ANAEROBIC BOTTLES CRITICAL RESULT CALLED TO, READ BACK BY AND VERIFIED WITH: PHARMD SCOTT CHRISTY 02/20/21@21 :17 BY TW Performed at [facility], [place] [address], [place], [place] [address]    Culture METHICILLIN RESISTANT STAPHYLOCOCCUS AUREUS (A)  Final   Report Status PENDING  Incomplete   Organism ID, Bacteria METHICILLIN RESISTANT STAPHYLOCOCCUS AUREUS  Final      Susceptibility   Methicillin resistant staphylococcus aureus - MIC*    CIPROFLOXACIN >=8 RESISTANT Resistant     ERYTHROMYCIN >=8 RESISTANT Resistant     GENTAMICIN <=0.5 SENSITIVE Sensitive     OXACILLIN >=4 RESISTANT Resistant      TETRACYCLINE >=16 RESISTANT Resistant     VANCOMYCIN <=0.5 SENSITIVE Sensitive     TRIMETH/SULFA >=320 RESISTANT Resistant     CLINDAMYCIN <=0.25 SENSITIVE Sensitive     RIFAMPIN <=0.5 SENSITIVE Sensitive     Inducible Clindamycin NEGATIVE Sensitive     * METHICILLIN RESISTANT STAPHYLOCOCCUS AUREUS  Urine Culture     Status: Abnormal   Collection Time: 02/19/21  9:25 PM   Specimen: In/Out Cath Urine  Result Value Ref Range Status   Specimen Description   Final    IN/OUT CATH URINE Performed at [facility] [address] [place], [place] [address]    Special Requests   Final    NONE Performed at [facility], [place] [address], [place], [place] [address]    Culture MULTIPLE SPECIES PRESENT, SUGGEST RECOLLECTION (A)  Final   Report Status 02/21/2021 FINAL  Final  Resp Panel by RT-PCR (Flu A&B, Covid) Nasopharyngeal Swab     Status: None   Collection Time: 02/19/21  9:25 PM   Specimen: Nasopharyngeal Swab; Nasopharyngeal(NP) swabs in vial transport medium  Result Value Ref Range Status   SARS Coronavirus 2 by RT PCR NEGATIVE NEGATIVE Final    Comment: (NOTE) SARS-CoV-2 target nucleic acids are NOT DETECTED.  The SARS-CoV-2 RNA is generally detectable in upper respiratory specimens during the acute phase of infection. The lowest concentration of SARS-CoV-2 viral copies this assay can detect is 138 copies/mL. A negative result does not preclude SARS-Cov-2 infection and should not be used as the sole basis for treatment or other patient management decisions. A negative result may occur with  improper specimen collection/handling, submission of specimen other than nasopharyngeal swab, presence of viral mutation(s) within the areas targeted by this assay, and inadequate number of viral copies(<138 copies/mL). A negative result must be combined with clinical observations, patient history, and epidemiological information. The expected result is  Negative.  Fact Sheet for Patients:  EntrepreneurPulse.com.au  Fact Sheet for Healthcare Providers:  IncredibleEmployment.be  This  test is no t yet approved or cleared by the [place] and  has been authorized for detection and/or diagnosis of SARS-CoV-2 by FDA under an Emergency Use Authorization (EUA). This EUA will remain  in effect (meaning this test can be used) for the duration of the COVID-19 declaration under Section 564(b)(1) of the Act, 21 U.S.C.section 360bbb-3(b)(1), unless the authorization is terminated  or revoked sooner.       Influenza A by PCR NEGATIVE NEGATIVE Final   Influenza B by PCR NEGATIVE NEGATIVE Final    Comment: (NOTE) The Xpert Xpress SARS-CoV-2/FLU/RSV plus assay is intended as an aid in the diagnosis of influenza from Nasopharyngeal swab specimens and should not be used as a sole basis for treatment. Nasal washings and aspirates are unacceptable for Xpert Xpress SARS-CoV-2/FLU/RSV testing.  Fact Sheet for Patients: EntrepreneurPulse.com.au  Fact Sheet for Healthcare Providers: IncredibleEmployment.be  This test is not yet approved or cleared by the [place] FDA and has been authorized for detection and/or diagnosis of SARS-CoV-2 by FDA under an Emergency Use Authorization (EUA). This EUA will remain in effect (meaning this test can be used) for the duration of the COVID-19 declaration under Section 564(b)(1) of the Act, 21 U.S.C. section 360bbb-3(b)(1), unless the authorization is terminated or revoked.  Performed at [facility], [place] [address] [place], [place] [address]   Blood Culture ID Panel (Reflexed)     Status: Abnormal   Collection Time: 02/19/21  9:25 PM  Result Value Ref Range Status   Enterococcus faecalis NOT DETECTED NOT DETECTED Final   Enterococcus Faecium NOT DETECTED NOT DETECTED Final   Listeria monocytogenes NOT  DETECTED NOT DETECTED Final   Staphylococcus species DETECTED (A) NOT DETECTED Final    Comment: CRITICAL RESULT CALLED TO, READ BACK BY AND VERIFIED WITH: PHARMD SCOTT CHRISTY 02/20/21@21 :17 BY TW    Staphylococcus aureus (BCID) DETECTED (A) NOT DETECTED Final    Comment: Methicillin (oxacillin)-resistant Staphylococcus aureus (MRSA). MRSA is predictably resistant to beta-lactam antibiotics (except ceftaroline). Preferred therapy is vancomycin unless clinically contraindicated. Patient requires contact precautions if  hospitalized. CRITICAL RESULT CALLED TO, READ BACK BY AND VERIFIED WITH: PHARMD SCOTT CHRISTY 02/20/21@21 :17 BY TW    Staphylococcus epidermidis NOT DETECTED NOT DETECTED Final   Staphylococcus lugdunensis NOT DETECTED NOT DETECTED Final   Streptococcus species NOT DETECTED NOT DETECTED Final   Streptococcus agalactiae NOT DETECTED NOT DETECTED Final   Streptococcus pneumoniae NOT DETECTED NOT DETECTED Final   Streptococcus pyogenes NOT DETECTED NOT DETECTED Final   A.calcoaceticus-baumannii NOT DETECTED NOT DETECTED Final   Bacteroides fragilis NOT DETECTED NOT DETECTED Final   Enterobacterales NOT DETECTED NOT DETECTED Final   Enterobacter cloacae complex NOT DETECTED NOT DETECTED Final   Escherichia coli NOT DETECTED NOT DETECTED Final   Klebsiella aerogenes NOT DETECTED NOT DETECTED Final   Klebsiella oxytoca NOT DETECTED NOT DETECTED Final   Klebsiella pneumoniae NOT DETECTED NOT DETECTED Final   Proteus species NOT DETECTED NOT DETECTED Final   Salmonella species NOT DETECTED NOT DETECTED Final   Serratia marcescens NOT DETECTED NOT DETECTED Final   Haemophilus influenzae NOT DETECTED NOT DETECTED Final   Neisseria meningitidis NOT DETECTED NOT DETECTED Final   Pseudomonas aeruginosa NOT DETECTED NOT DETECTED Final   Stenotrophomonas maltophilia NOT DETECTED NOT DETECTED Final   Candida albicans NOT DETECTED NOT DETECTED Final   Candida auris NOT DETECTED NOT  DETECTED Final   Candida glabrata NOT DETECTED NOT DETECTED Final   Candida krusei NOT DETECTED  NOT DETECTED Final   Candida parapsilosis NOT DETECTED NOT DETECTED Final   Candida tropicalis NOT DETECTED NOT DETECTED Final   Cryptococcus neoformans/gattii NOT DETECTED NOT DETECTED Final   Meth resistant mecA/C and MREJ DETECTED (A) NOT DETECTED Final    Comment: CRITICAL RESULT CALLED TO, READ BACK BY AND VERIFIED WITH: PHARMD SCOTT CHRISTY 02/20/21@21 :17 BY TW Performed at [facility] [address] [place], [place] [address]   Culture, blood (routine x 2)     Status: None (Preliminary result)   Collection Time: 02/21/21 11:40 AM   Specimen: BLOOD  Result Value Ref Range Status   Specimen Description   Final    BLOOD BLOOD LEFT HAND Performed at [facility] [address], [place], [place] [address]    Special Requests   Final    BOTTLES DRAWN AEROBIC AND ANAEROBIC Blood Culture adequate volume Performed at [facility] [address] [place], [place] [address]    Culture   Final    NO GROWTH < 24 HOURS Performed at [facility] [address], [place], [place] [address]    Report Status PENDING  Incomplete  Culture, blood (routine x 2)     Status: None (Preliminary result)   Collection Time: 02/21/21 11:40 AM   Specimen: BLOOD  Result Value Ref Range Status   Specimen Description   Final    BLOOD RIGHT ANTECUBITAL Performed at [facility] [address] [place], [place] [address]    Special Requests   Final    BOTTLES DRAWN AEROBIC ONLY Blood Culture adequate volume Performed at [facility] [address], [place], [place] [address]    Culture   Final    NO GROWTH < 24 HOURS Performed at [facility] [address], [place], [place] [address]    Report Status PENDING  Incomplete          Radiology Studies: CT ABDOMEN PELVIS W CONTRAST  Result Date:  02/21/2021 CLINICAL DATA:  Soft tissue abscess near the scrotum. MRSA bacteremia. EXAM: CT ABDOMEN AND PELVIS WITH CONTRAST TECHNIQUE: Multidetector CT imaging of the abdomen and pelvis was performed using the standard protocol following bolus administration of intravenous contrast. CONTRAST:  3mL OMNIPAQUE IOHEXOL 350 MG/ML SOLN COMPARISON:  None. FINDINGS: Lower chest: Mild dependent opacity at the posterior lung bases, greater on the left, consistent with atelectasis. Trace left pleural effusion. Trace pericardial effusion. No convincing acute abnormality. Hepatobiliary: Liver normal in size and attenuation. No masses. Several calcifications consistent with healed granuloma. Normal gallbladder. No bile duct dilation. Pancreas: Unremarkable. No pancreatic ductal dilatation or surrounding inflammatory changes. Spleen: Normal size. Scattered calcifications consistent with healed granuloma. No masses. Adrenals/Urinary Tract: 1.7 cm right adrenal mass. No significant washout between the initial and delayed post-contrast sequences. Mass is nonspecific. Normal left adrenal gland. Mild bilateral renal cortical thinning. Symmetric renal enhancement and excretion. Subcentimeter low-density lesion, posterior lower pole of the left kidney, consistent with a cyst. No other renal masses, no stones and no hydronephrosis. Normal ureters. Bladder minimally distended, Foley catheter at the bladder base. There is mild bladder wall thickening and non dependent air. Stomach/Bowel: Normal stomach. Small bowel and colon are normal in caliber. No wall thickening. No inflammation. Numerous colonic diverticula without diverticulitis. No evidence of appendicitis. Vascular/Lymphatic: Significant aortic atherosclerosis. No aneurysm. No enlarged lymph nodes. Reproductive: Enlarged prostate, 5.9 x 5.5 x 5.8 cm. Other: Subcutaneous soft tissue inflammatory change along the  inferior margins of both buttocks. No soft tissue abscess. No abdominal  wall hernia. No ascites. Musculoskeletal: No fracture or acute finding.  No bone lesion. IMPRESSION: 1. No evidence of an abscess. Perineal region subcutaneous inflammatory changes. 2. Bladder wall mildly thickened, which may be due to lack of distension possibly from chronic bladder outlet obstruction due to prostate enlargement. Consider cystitis in the proper clinical setting. 3. No other evidence of an acute abnormality within the abdomen or pelvis. 4. 1.7 cm indeterminate right adrenal mass. In this age group, specific follow-up is not recommended unless center is a history of a primary malignancy. 5. Aortic atherosclerosis. 6. Prostate enlargement. Electronically Signed   By: Lajean Manes M.D.   On: 02/21/2021 13:23   ECHOCARDIOGRAM COMPLETE  Result Date: 02/22/2021    ECHOCARDIOGRAM REPORT   Patient Name:   Mitchell Abbott Date of Exam: 02/22/2021 Medical Rec #:  [id number]          Height:       69.0 in Accession #:    [id number]         Weight:       132.3 lb Date of Birth:  26-Apr-1924         BSA:          1.733 m Patient Age:    18 years           BP:           139/70 mmHg Patient Gender: M                  HR:           78 bpm. Exam Location:  Inpatient Procedure: 2D Echo, Color Doppler and Cardiac Doppler Indications:    Bacteremia R78.81  History:        Patient has prior history of Echocardiogram examinations, most                 recent 06/28/2016. Signs/Symptoms:Alzheimer's.  Sonographer:    Raquel Sarna Senior RDCS Referring Phys: [id number] [place]  1. Left ventricular ejection fraction, by estimation, is 60 to 65%. The left ventricle has normal function. The left ventricle has no regional wall motion abnormalities. There is mild asymmetric left ventricular hypertrophy of the basal and septal segments. Left ventricular diastolic parameters were normal.  2. Right ventricular systolic function is normal. The right ventricular size is normal. There is mildly elevated pulmonary artery  systolic pressure.  3. Left atrial size was mildly dilated.  4. The mitral valve is normal in structure. Trivial mitral valve regurgitation. No evidence of mitral stenosis.  5. The aortic valve is tricuspid. There is moderate calcification of the aortic valve. Aortic valve regurgitation is not visualized. Moderate sclerosis no stenosis.  6. The inferior vena cava is normal in size with greater than 50% respiratory variability, suggesting right atrial pressure of 3 mmHg. FINDINGS  Left Ventricle: Left ventricular ejection fraction, by estimation, is 60 to 65%. The left ventricle has normal function. The left ventricle has no regional wall motion abnormalities. The left ventricular internal cavity size was normal in size. There is  mild asymmetric left ventricular hypertrophy of the basal and septal segments. Left ventricular diastolic parameters were normal. Right Ventricle: The right ventricular size is normal. No increase in right ventricular wall thickness. Right ventricular systolic function is normal. There is mildly elevated pulmonary artery systolic pressure. The tricuspid regurgitant velocity is 2.32  m/s, and with an assumed right atrial pressure  of 15 mmHg, the estimated right ventricular systolic pressure is 70.3 mmHg. Left Atrium: Left atrial size was mildly dilated. Right Atrium: Right atrial size was normal in size. Pericardium: There is no evidence of pericardial effusion. Mitral Valve: The mitral valve is normal in structure. Trivial mitral valve regurgitation. No evidence of mitral valve stenosis. Tricuspid Valve: The tricuspid valve is normal in structure. Tricuspid valve regurgitation is trivial. No evidence of tricuspid stenosis. Aortic Valve: The aortic valve is tricuspid. There is moderate calcification of the aortic valve. Aortic valve regurgitation is not visualized. Moderate sclerosis no stenosis. Pulmonic Valve: The pulmonic valve was normal in structure. Pulmonic valve regurgitation is  trivial. No evidence of pulmonic stenosis. Aorta: The aortic root is normal in size and structure. Venous: The inferior vena cava is normal in size with greater than 50% respiratory variability, suggesting right atrial pressure of 3 mmHg. IAS/Shunts: No atrial level shunt detected by color flow Doppler.  LEFT VENTRICLE PLAX 2D LVIDd:         4.30 cm  Diastology LVIDs:         2.70 cm  LV e' medial:    7.07 cm/s LV PW:         1.10 cm  LV E/e' medial:  9.5 LV IVS:        1.30 cm  LV e' lateral:   7.40 cm/s LVOT diam:     2.40 cm  LV E/e' lateral: 9.1 LV SV:         98 LV SV Index:   56 LVOT Area:     4.52 cm  RIGHT VENTRICLE RV S prime:     17.40 cm/s TAPSE (M-mode): 2.2 cm LEFT ATRIUM           Index       RIGHT ATRIUM           Index LA diam:      3.80 cm 2.19 cm/m  RA Area:     17.10 cm LA Vol (A2C): 57.1 ml 32.95 ml/m RA Volume:   50.50 ml  29.14 ml/m LA Vol (A4C): 45.8 ml 26.43 ml/m  AORTIC VALVE LVOT Vmax:   91.40 cm/s LVOT Vmean:  65.700 cm/s LVOT VTI:    0.216 m  AORTA Ao Root diam: 3.40 cm Ao Asc diam:  3.70 cm MITRAL VALVE               TRICUSPID VALVE MV Area (PHT): 2.94 cm    TR Peak grad:   21.5 mmHg MV Decel Time: 258 msec    TR Vmax:        232.00 cm/s MV E velocity: 67.30 cm/s MV A velocity: 73.70 cm/s  SHUNTS MV E/A ratio:  0.91        Systemic VTI:  0.22 m                            Systemic Diam: 2.40 cm Jenkins Rouge MD Electronically signed by Jenkins Rouge MD Signature Date/Time: 02/22/2021/9:55:34 AM    Final         Scheduled Meds:  aspirin EC  81 mg Oral Daily   Chlorhexidine Gluconate Cloth  6 each Topical Daily   enoxaparin (LOVENOX) injection  40 mg Subcutaneous Q24H   feeding supplement  1 Container Oral Q24H   feeding supplement  237 mL Oral BID BM   mupirocin cream   Topical BID   tamsulosin  0.4 mg  Oral QHS   Continuous Infusions:  vancomycin 750 mg (02/22/21 0933)          Aline August, MD [facility] 02/23/2021, 7:38 AM

## 2021-02-23 NOTE — Progress Notes (Signed)
[facility] for Infectious Disease  Date of Admission:  02/19/2021      Lines: Peripheral iv's  Abx: 9/23-c vanc  ASSESSMENT: Mrsa infection Cellulitis   Wife said she has been putting on topical abx for scrotal area although I don't see any open lesion on the skin on initial exam outside of posterior scrotal/perineal erythema. Ct abd/pelv without evidence abscess  Doing well on vanc with significant improvement 9/22 bcx mrsa 9/24 bcx negative Tte negative  Low burden and short duration sx pta will plan to do 2 weeks of abx, and defer tee. Potentially can transition to linezolid to finish 2 week course    PLAN: Spoke with primary team. Plan to transition to po abx on discharge to finish 2 weeks tx from 9/24 Continue vancomycin for now On discharge give po linezolid 600 mg bid enough to last until 10/08 Id f/u with [facility] on 10/25 @ 945 with Dr Gale Journey Discussed with primary team Will sign off  I spent more than 35 minute reviewing data/chart, and coordinating care and >50% direct face to face time providing counseling/discussing diagnostics/treatment plan with patient   Principal Problem:   Complicated UTI (urinary tract infection) Active Problems:   Dementia (HCC)   Generalized weakness   Hyponatremia   Pressure injury of skin   MRSA bacteremia   Soft tissue infection   Allergies  Allergen Reactions   Doxycycline Swelling    Lower lip swelling   Tessalon [Benzonatate] Swelling    Lower lip swelling    Scheduled Meds:  aspirin EC  81 mg Oral Daily   Chlorhexidine Gluconate Cloth  6 each Topical Daily   enoxaparin (LOVENOX) injection  40 mg Subcutaneous Q24H   feeding supplement  1 Container Oral Q24H   feeding supplement  237 mL Oral BID BM   mupirocin cream   Topical BID   tamsulosin  0.4 mg Oral QHS   Continuous Infusions:  vancomycin 750 mg (02/23/21 1315)   PRN Meds:.acetaminophen **OR** acetaminophen   SUBJECTIVE: Wife by  bedside Patient back to normal mentation Still weak but doing pt/ot and oob No f/c No n/v/diarrhea Repeat bcx negative  Review of Systems: ROS All other ROS was negative, except mentioned above     OBJECTIVE: Vitals:   02/22/21 1401 02/22/21 2156 02/23/21 0524 02/23/21 1303  BP: 128/68 118/63 127/64 (!) 146/81  Pulse: 72 76 67 75  Resp: 17 17 17 18   Temp: 98.3 F (36.8 C) 98.7 F (37.1 C) 98.6 F (37 C)   TempSrc:  Oral Oral   SpO2: 96% 98% 98% 99%  Weight:      Height:       Body mass index is 19.53 kg/m.  Physical Exam General/constitutional: no distress, pleasant; nonverbal but somewhat cooperative with exam HEENT: Normocephalic, PER, Conj Clear, EOMI, Oropharynx clear Neck supple CV: rrr no mrg Lungs: clear to auscultation, normal respiratory effort Abd: Soft, Nontender Ext: no edema Skin: No Rash - scattered evolving age echymoses on extremities Neuro: nonfocal - generalized weakness 4-5/5 ext strength MSK: no peripheral joint swelling/tenderness/warmth; back spines nontender    Lab Results Lab Results  Component Value Date   WBC 7.0 02/23/2021   HGB 11.6 (L) 02/23/2021   HCT 35.7 (L) 02/23/2021   MCV 95.7 02/23/2021   PLT 350 02/23/2021    Lab Results  Component Value Date   CREATININE 0.66 02/23/2021   BUN 14 02/23/2021   NA 139 02/23/2021  K 3.9 02/23/2021   CL 110 02/23/2021   CO2 24 02/23/2021    Lab Results  Component Value Date   ALT 22 02/19/2021   AST 20 02/19/2021   ALKPHOS 84 02/19/2021   BILITOT 1.0 02/19/2021      Microbiology: Recent Results (from the past 240 hour(s))  Blood culture (routine single)     Status: Abnormal   Collection Time: 02/19/21  9:25 PM   Specimen: BLOOD  Result Value Ref Range Status   Specimen Description   Final    BLOOD SPECIMEN SOURCE NOT MARKED ON REQUISITION Performed at [facility] [address] [place], [place] [address]    Special Requests   Final    BOTTLES  DRAWN AEROBIC AND ANAEROBIC Blood Culture adequate volume Performed at [facility] [address], [place], [place] [address]    Culture  Setup Time   Final    GRAM POSITIVE COCCI IN BOTH AEROBIC AND ANAEROBIC BOTTLES CRITICAL RESULT CALLED TO, READ BACK BY AND VERIFIED WITH: PHARMD SCOTT CHRISTY 02/20/21@21 :17 BY TW Performed at [facility], [place] [address], [place], [place] [address]    Culture METHICILLIN RESISTANT STAPHYLOCOCCUS AUREUS (A)  Final   Report Status 02/23/2021 FINAL  Final   Organism ID, Bacteria METHICILLIN RESISTANT STAPHYLOCOCCUS AUREUS  Final      Susceptibility   Methicillin resistant staphylococcus aureus - MIC*    CIPROFLOXACIN >=8 RESISTANT Resistant     ERYTHROMYCIN >=8 RESISTANT Resistant     GENTAMICIN <=0.5 SENSITIVE Sensitive     OXACILLIN >=4 RESISTANT Resistant     TETRACYCLINE >=16 RESISTANT Resistant     VANCOMYCIN <=0.5 SENSITIVE Sensitive     TRIMETH/SULFA >=320 RESISTANT Resistant     CLINDAMYCIN <=0.25 SENSITIVE Sensitive     RIFAMPIN <=0.5 SENSITIVE Sensitive     Inducible Clindamycin NEGATIVE Sensitive     * METHICILLIN RESISTANT STAPHYLOCOCCUS AUREUS  Urine Culture     Status: Abnormal   Collection Time: 02/19/21  9:25 PM   Specimen: In/Out Cath Urine  Result Value Ref Range Status   Specimen Description   Final    IN/OUT CATH URINE Performed at [facility] [address], [place], [place] [address]    Special Requests   Final    NONE Performed at [facility], [place] [address] [place], [place] [address]    Culture MULTIPLE SPECIES PRESENT, SUGGEST RECOLLECTION (A)  Final   Report Status 02/21/2021 FINAL  Final  Resp Panel by RT-PCR (Flu A&B, Covid) Nasopharyngeal Swab     Status: None   Collection Time: 02/19/21  9:25 PM   Specimen: Nasopharyngeal Swab; Nasopharyngeal(NP) swabs in vial transport medium  Result Value Ref Range Status   SARS Coronavirus 2 by RT PCR  NEGATIVE NEGATIVE Final    Comment: (NOTE) SARS-CoV-2 target nucleic acids are NOT DETECTED.  The SARS-CoV-2 RNA is generally detectable in upper respiratory specimens during the acute phase of infection. The lowest concentration of SARS-CoV-2 viral copies this assay can detect is 138 copies/mL. A negative result does not preclude SARS-Cov-2 infection and should not be used as the sole basis for treatment or other patient management decisions. A negative result may occur with  improper specimen collection/handling, submission of specimen other than nasopharyngeal swab, presence of viral mutation(s) within the areas targeted by this assay, and inadequate number of viral copies(<138 copies/mL). A negative result must be combined with clinical observations, patient history, and epidemiological information.  The expected result is Negative.  Fact Sheet for Patients:  EntrepreneurPulse.com.au  Fact Sheet for Healthcare Providers:  IncredibleEmployment.be  This test is no t yet approved or cleared by the [place] FDA and  has been authorized for detection and/or diagnosis of SARS-CoV-2 by FDA under an Emergency Use Authorization (EUA). This EUA will remain  in effect (meaning this test can be used) for the duration of the COVID-19 declaration under Section 564(b)(1) of the Act, 21 U.S.C.section 360bbb-3(b)(1), unless the authorization is terminated  or revoked sooner.       Influenza A by PCR NEGATIVE NEGATIVE Final   Influenza B by PCR NEGATIVE NEGATIVE Final    Comment: (NOTE) The Xpert Xpress SARS-CoV-2/FLU/RSV plus assay is intended as an aid in the diagnosis of influenza from Nasopharyngeal swab specimens and should not be used as a sole basis for treatment. Nasal washings and aspirates are unacceptable for Xpert Xpress SARS-CoV-2/FLU/RSV testing.  Fact Sheet for Patients: EntrepreneurPulse.com.au  Fact Sheet for  Healthcare Providers: IncredibleEmployment.be  This test is not yet approved or cleared by the [place] FDA and has been authorized for detection and/or diagnosis of SARS-CoV-2 by FDA under an Emergency Use Authorization (EUA). This EUA will remain in effect (meaning this test can be used) for the duration of the COVID-19 declaration under Section 564(b)(1) of the Act, 21 U.S.C. section 360bbb-3(b)(1), unless the authorization is terminated or revoked.  Performed at [facility], [place] [address] [place], [place] [address]   Blood Culture ID Panel (Reflexed)     Status: Abnormal   Collection Time: 02/19/21  9:25 PM  Result Value Ref Range Status   Enterococcus faecalis NOT DETECTED NOT DETECTED Final   Enterococcus Faecium NOT DETECTED NOT DETECTED Final   Listeria monocytogenes NOT DETECTED NOT DETECTED Final   Staphylococcus species DETECTED (A) NOT DETECTED Final    Comment: CRITICAL RESULT CALLED TO, READ BACK BY AND VERIFIED WITH: PHARMD SCOTT CHRISTY 02/20/21@21 :17 BY TW    Staphylococcus aureus (BCID) DETECTED (A) NOT DETECTED Final    Comment: Methicillin (oxacillin)-resistant Staphylococcus aureus (MRSA). MRSA is predictably resistant to beta-lactam antibiotics (except ceftaroline). Preferred therapy is vancomycin unless clinically contraindicated. Patient requires contact precautions if  hospitalized. CRITICAL RESULT CALLED TO, READ BACK BY AND VERIFIED WITH: PHARMD SCOTT CHRISTY 02/20/21@21 :17 BY TW    Staphylococcus epidermidis NOT DETECTED NOT DETECTED Final   Staphylococcus lugdunensis NOT DETECTED NOT DETECTED Final   Streptococcus species NOT DETECTED NOT DETECTED Final   Streptococcus agalactiae NOT DETECTED NOT DETECTED Final   Streptococcus pneumoniae NOT DETECTED NOT DETECTED Final   Streptococcus pyogenes NOT DETECTED NOT DETECTED Final   A.calcoaceticus-baumannii NOT DETECTED NOT DETECTED Final   Bacteroides fragilis  NOT DETECTED NOT DETECTED Final   Enterobacterales NOT DETECTED NOT DETECTED Final   Enterobacter cloacae complex NOT DETECTED NOT DETECTED Final   Escherichia coli NOT DETECTED NOT DETECTED Final   Klebsiella aerogenes NOT DETECTED NOT DETECTED Final   Klebsiella oxytoca NOT DETECTED NOT DETECTED Final   Klebsiella pneumoniae NOT DETECTED NOT DETECTED Final   Proteus species NOT DETECTED NOT DETECTED Final   Salmonella species NOT DETECTED NOT DETECTED Final   Serratia marcescens NOT DETECTED NOT DETECTED Final   Haemophilus influenzae NOT DETECTED NOT DETECTED Final   Neisseria meningitidis NOT DETECTED NOT DETECTED Final   Pseudomonas aeruginosa NOT DETECTED NOT DETECTED Final   Stenotrophomonas maltophilia NOT DETECTED NOT DETECTED Final   Candida albicans NOT DETECTED NOT DETECTED Final  Candida auris NOT DETECTED NOT DETECTED Final   Candida glabrata NOT DETECTED NOT DETECTED Final   Candida krusei NOT DETECTED NOT DETECTED Final   Candida parapsilosis NOT DETECTED NOT DETECTED Final   Candida tropicalis NOT DETECTED NOT DETECTED Final   Cryptococcus neoformans/gattii NOT DETECTED NOT DETECTED Final   Meth resistant mecA/C and MREJ DETECTED (A) NOT DETECTED Final    Comment: CRITICAL RESULT CALLED TO, READ BACK BY AND VERIFIED WITH: PHARMD SCOTT CHRISTY 02/20/21@21 :17 BY TW Performed at [facility] [address] [place], [place] [address]   Culture, blood (routine x 2)     Status: None (Preliminary result)   Collection Time: 02/21/21 11:40 AM   Specimen: BLOOD  Result Value Ref Range Status   Specimen Description   Final    BLOOD BLOOD LEFT HAND Performed at [facility] [address], [place], [place] [address]    Special Requests   Final    BOTTLES DRAWN AEROBIC AND ANAEROBIC Blood Culture adequate volume Performed at [facility] [address] [place], [place] [address]    Culture   Final    NO GROWTH 2  DAYS Performed at [facility] [address], [place], [place] [address]    Report Status PENDING  Incomplete  Culture, blood (routine x 2)     Status: None (Preliminary result)   Collection Time: 02/21/21 11:40 AM   Specimen: BLOOD  Result Value Ref Range Status   Specimen Description   Final    BLOOD RIGHT ANTECUBITAL Performed at [facility] [address], [place], [place] [address]    Special Requests   Final    BOTTLES DRAWN AEROBIC ONLY Blood Culture adequate volume Performed at [facility] [address], [place], [place] [address]    Culture   Final    NO GROWTH 2 DAYS Performed at [facility] [address] [place], [place] [address]    Report Status PENDING  Incomplete     Serology:   Imaging: If present, new imagings (plain films, ct scans, and mri) have been personally visualized and interpreted; radiology reports have been reviewed. Decision making incorporated into the Impression / Recommendations.  9/24 abd pelv ct with contrast Subcutaneous soft tissue inflammatory change along the inferior margins of both buttocks. No soft tissue abscess. No abdominal wall hernia. No ascites.  Jabier Mutton, [facility] for Infectious [place] [phone] pager    02/23/2021, 1:24 PM

## 2021-02-23 NOTE — Progress Notes (Signed)
Physical Therapy Treatment Patient Details Name: Mitchell Abbott MRN: [id number] DOB: [birth year] Today's Date: 02/23/2021   History of Present Illness 87 y.o. male admitted 02/19/21 with generalized weakness, fatigue, fever. Dx of complicated UTI. with medical history significant for dementia, chronic indwelling Foley catheter, history of TIA and skin cancers, BPH    PT Comments    Seen in pm  Spouse requesting.  She may just take him home.  Sge stated, "close to his baseline" but she wants to see how he does tomorrow with his mobility.  She provides 24/7 care for him but pt needs to be a +1 assit for mobility (currently +2)  Recommendations for follow up therapy are one component of a multi-disciplinary discharge planning process, led by the attending physician.  Recommendations may be updated based on patient status, additional functional criteria and insurance authorization.  Follow Up Recommendations  Home health PT;SNF (spouse wants to see how he does tomorrow "this is close to his baseline")     Equipment Recommendations  None recommended by PT    Recommendations for Other Services       Precautions / Restrictions Precautions Precautions: Fall Precaution Comments: Alzheimer's dementia (non verbal) Restrictions Weight Bearing Restrictions: No     Mobility  Bed Mobility Overal bed mobility: Needs Assistance Bed Mobility: Sit to Supine       Sit to supine: Mod assist;+2 for physical assistance   General bed mobility comments: assisted back to bed and positioned with pillows.    Transfers Overall transfer level: Needs assistance Equipment used: Rolling walker (2 wheeled);None Transfers: Sit to/from Stand Sit to Stand: Mod assist;+2 safety/equipment Stand pivot transfers: Mod assist;+2 safety/equipment       General transfer comment: spouse demonstared she she stands in front of pt and blocks his feet from "skiing" with hers and pulls him up by his arms.  Pt  responded well to this tech.  Initial posterior lean/push.  Once upright, pt was able to support his own weight and stand partial erect.  Ambulation/Gait Ambulation/Gait assistance: Min assist;+2 physical assistance;+2 safety/equipment Gait Distance (Feet): 37 Feet Assistive device: Rolling walker (2 wheeled) Gait Pattern/deviations: Step-to pattern;Step-through pattern;Narrow base of support;Ataxic;Festinating;Shuffle Gait velocity: decreased   General Gait Details: Once upright and prompted by wife plus physical cueing to initiate "steps" pt was able to amb with walker but guided by spouse and Therapist to support for balance with recliner following behind.  Pt tends to push walker too far to front.  Required physical assist to keep posture upright.  Short shuffled steps.  HIGH FALL RISK.   Stairs             Wheelchair Mobility    Modified Rankin (Stroke Patients Only)       Balance                                            Cognition Arousal/Alertness: Awake/alert Behavior During Therapy: WFL for tasks assessed/performed Overall Cognitive Status: History of cognitive impairments - at baseline                                 General Comments: non verbal, responds well to wife's commands.  Good eye contact.      Exercises      General Comments  Pertinent Vitals/Pain Pain Assessment: No/denies pain    Home Living                      Prior Function            PT Goals (current goals can now be found in the care plan section) Progress towards PT goals: Progressing toward goals    Frequency    Min 3X/week      PT Plan Current plan remains appropriate    Co-evaluation              AM-PAC PT "6 Clicks" Mobility   Outcome Measure  Help needed turning from your back to your side while in a flat bed without using bedrails?: A Lot Help needed moving from lying on your back to sitting on the side  of a flat bed without using bedrails?: A Lot Help needed moving to and from a bed to a chair (including a wheelchair)?: A Lot Help needed standing up from a chair using your arms (e.g., wheelchair or bedside chair)?: A Lot Help needed to walk in hospital room?: A Lot Help needed climbing 3-5 steps with a railing? : Total 6 Click Score: 11    End of Session Equipment Utilized During Treatment: Gait belt Activity Tolerance: Patient tolerated treatment well;No increased pain Patient left: in bed;with call bell/phone within reach;with bed alarm set Nurse Communication: Mobility status PT Visit Diagnosis: Difficulty in walking, not elsewhere classified (R26.2);History of falling (Z91.81);Other abnormalities of gait and mobility (R26.89);Unsteadiness on feet (R26.81)     Time: 1435-1450 PT Time Calculation (min) (ACUTE ONLY): 15 min  Charges:  $Gait Training: 8-22 mins                     {Mylene Bow  PTA Acute  Rehabilitation Services Pager      [phone] Office      [phone]

## 2021-02-23 NOTE — Progress Notes (Signed)
Pharmacy Antibiotic Note  Mitchell Abbott is a 87 y.o. male admitted on 02/19/2021 with UTI.  Pharmacy has been consulted for vancomycin dosing.  Today, 02/23/21  WBC wnl afebrile SCr low/stable Plan to transition patient to oral Zyvox soon  Plan: Continue vancomycin 750 mg iv q 24h now and f/u plans to transition to Zyvox Monitor clinical course, renal function, cultures as available   Height: 5\' 9"  (175.3 cm) (estimate) Weight: 60 kg (132 lb 4.4 oz) IBW/kg (Calculated) : 70.7  Temp (24hrs), Avg:98.5 F (36.9 C), Min:98.3 F (36.8 C), Max:98.7 F (37.1 C)  Recent Labs  Lab 02/19/21 2125 02/20/21 0334 02/21/21 0318 02/22/21 0256 02/23/21 0901  WBC 10.4 12.1* 9.5 7.8 7.0  CREATININE 0.82 0.69 0.84 0.81 0.66  LATICACIDVEN 1.3  --   --   --   --      Estimated Creatinine Clearance: 47.9 mL/min (by C-G formula based on SCr of 0.66 mg/dL).    Allergies  Allergen Reactions   Doxycycline Swelling    Lower lip swelling   Tessalon [Benzonatate] Swelling    Lower lip swelling    Antimicrobials this admission: 9/22 ceftriaxone x1  9/23 vancomycin >>  9/23 cefepime >> 9/25  Dose adjustments this admission:   Microbiology results: 9/22 BCx: MRSA 282 9/22 UCx: multiple species 9/22 resp panel: Negative   9/24 repeat BCx: ngtd    Thank you for allowing pharmacy to be a part of this patient's care.   [name]  02/23/2021 1:36 PM

## 2021-02-23 NOTE — TOC Initial Note (Signed)
Transition of Care [facility]) - Initial/Assessment Note   Patient Details  Name: Mitchell Abbott MRN: [id number] Date of Birth: Aug 15, 1923  Transition of Care [facility] CM/SW Contact:    Sherie Don, LCSW Phone Number: 02/23/2021, 3:06 PM  Clinical Narrative: PT evaluation recommended SNF. CSW spoke with patient's wife, Mitchell Abbott, regarding recommendations. Per wife, she would prefer to bring the patient home with [facility] if possible rather than have him discharge to SNF, but is agreeable to SNF if the patient will require a longer period on IV antibiotics. TOC awaiting ID recommendations.  Expected Discharge Plan: [place] Barriers to Discharge: Continued Medical Work up  Patient Goals and CMS Choice Patient states their goals for this hospitalization and ongoing recovery are:: Go to rehab or home with [facility] CMS Medicare.gov Compare Post Acute Care list provided to:: Patient Represenative (must comment) Choice offered to / list presented to : Spouse  Expected Discharge Plan and Services Expected Discharge Plan: [place] In-house Referral: Clinical Social Work Post Acute Care Choice: [place] Living arrangements for the past 2 months: [place]             DME Arranged: N/A DME Agency: NA  Prior Living Arrangements/Services Living arrangements for the past 2 months: Single Family Home Lives with:: Spouse Patient language and need for interpreter reviewed:: Yes Do you feel safe going back to the place where you live?: Yes      Need for Family Participation in Patient Care: Yes (Comment) (Patient has dementia.) Care giver support system in place?: Yes (comment) Criminal Activity/Legal Involvement Pertinent to Current Situation/Hospitalization: No - Comment as needed  Activities of Daily Living Home Assistive Devices/Equipment: None ADL Screening (condition at time of admission) Patient's cognitive ability adequate to safely  complete daily activities?: No Is the patient deaf or have difficulty hearing?: Yes Does the patient have difficulty seeing, even when wearing glasses/contacts?: No Does the patient have difficulty concentrating, remembering, or making decisions?: Yes Patient able to express need for assistance with ADLs?: No Does the patient have difficulty dressing or bathing?: Yes Independently performs ADLs?: No Communication: Dependent Dressing (OT): Needs assistance Is this a change from baseline?: Pre-admission baseline Grooming: Needs assistance Is this a change from baseline?: Pre-admission baseline Feeding: Needs assistance Is this a change from baseline?: Pre-admission baseline Bathing: Needs assistance Is this a change from baseline?: Pre-admission baseline Toileting: Needs assistance Is this a change from baseline?: Pre-admission baseline In/Out Bed: Independent Walks in Home: Independent Does the patient have difficulty walking or climbing stairs?: No Weakness of Legs: Both Weakness of Arms/Hands: None  Emotional Assessment Attitude/Demeanor/Rapport: Unable to Assess Affect (typically observed): Unable to Assess Orientation: : Oriented to Self Alcohol / Substance Use: Not Applicable Psych Involvement: No (comment)  Admission diagnosis:  Weakness [Q76.1] Complicated UTI (urinary tract infection) [N39.0] Urinary tract infection associated with indwelling urethral catheter, initial encounter ([place]) [P50.932I, N39.0] Patient Active Problem List   Diagnosis Date Noted   Pressure injury of skin 02/21/2021   MRSA bacteremia    Soft tissue infection    Complicated UTI (urinary tract infection) 02/19/2021   Generalized weakness 02/19/2021   Hyponatremia 02/19/2021   Protein-calorie malnutrition, severe 01/13/2021   Sepsis secondary to UTI ([place]) 01/12/2021   Seborrheic keratosis 12/26/2020   Lip swelling 02/23/2020   Hearing difficulty of both ears 08/21/2019   Urethral bleeding  09/02/2018   Rash 07/25/2018   Syncope 06/27/2016   Maxillary sinusitis 05/27/2015   Apathy  01/28/2015   Allergic rhinitis 01/20/2015   Bacterial conjunctivitis of left eye 11/05/2014   Alzheimer's disease with late onset ([place]) 01/15/2014   Chronic renal insufficiency, stage II (mild) 09/03/2013   Observation for suspected malignant neoplasm 09/03/2013   Preventative health care 09/03/2013   Orthostatic syncope 02/05/2013   Mild cognitive impairment 10/20/2012   Cervical myofascial strain 08/15/2012   Sacroiliac joint pain 02/18/2012   History of TIA (transient ischemic attack)    Benign prostatic hyperplasia 10/13/2011   Headache disorder 10/13/2011   Dementia ([place]) 10/13/2011   PCP:  Tammi Sou, MD Pharmacy:   [facility] [place], [place] [address] [place] [address] Phone: [phone] Fax: [phone]  [facility] - [address], [place] [address] Phone: [phone] Fax: [phone]  Readmission Risk Interventions No flowsheet data found.

## 2021-02-23 NOTE — TOC Benefit Eligibility Note (Signed)
Patient Teacher, English as a foreign language completed.    The patient is currently admitted and upon discharge could be taking linezolid (Zyvox) 600 mg.  The current 30 day co-pay is, $14.00.   The patient is insured through [place], [place] Patient Advocate Specialist [place] Team Direct Number: [phone]  Fax: [phone]

## 2021-02-24 ENCOUNTER — Other Ambulatory Visit (HOSPITAL_COMMUNITY): Payer: Self-pay

## 2021-02-24 DIAGNOSIS — B9562 Methicillin resistant Staphylococcus aureus infection as the cause of diseases classified elsewhere: Secondary | ICD-10-CM | POA: Diagnosis not present

## 2021-02-24 DIAGNOSIS — E871 Hypo-osmolality and hyponatremia: Secondary | ICD-10-CM | POA: Diagnosis not present

## 2021-02-24 DIAGNOSIS — R7881 Bacteremia: Secondary | ICD-10-CM | POA: Diagnosis not present

## 2021-02-24 DIAGNOSIS — N390 Urinary tract infection, site not specified: Secondary | ICD-10-CM | POA: Diagnosis not present

## 2021-02-24 LAB — MAGNESIUM: Magnesium: 2.1 mg/dL (ref 1.7–2.4)

## 2021-02-24 LAB — BASIC METABOLIC PANEL
Anion gap: 6 (ref 5–15)
BUN: 15 mg/dL (ref 8–23)
CO2: 25 mmol/L (ref 22–32)
Calcium: 8.6 mg/dL — ABNORMAL LOW (ref 8.9–10.3)
Chloride: 106 mmol/L (ref 98–111)
Creatinine, Ser: 0.71 mg/dL (ref 0.61–1.24)
GFR, Estimated: >60 mL/min (ref >60)
Glucose, Bld: 95 mg/dL (ref 70–99)
Potassium: 4.5 mmol/L (ref 3.5–5.1)
Sodium: 137 mmol/L (ref 135–145)

## 2021-02-24 MED ORDER — LINEZOLID 600 MG PO TABS
600.0000 mg | ORAL_TABLET | Freq: Two times a day (BID) | ORAL | 0 refills | Status: DC
  Filled 2021-02-24: qty 20, 10d supply, fill #0

## 2021-02-24 MED ORDER — FOOD THICKENER (SIMPLYTHICK)
1.0000 | Freq: Every day | ORAL | Status: DC
  Administered 2021-02-24 – 2021-02-25 (×3): 1 via ORAL
  Filled 2021-02-24 (×8): qty 1

## 2021-02-24 NOTE — Progress Notes (Signed)
Daily Progress Note   Patient Name: Mitchell Abbott       Date: 02/24/2021 DOB: 08-Jun-1923  Age: 87 y.o. MRN#: [id number] Attending Physician: Aline August, MD Primary Care Physician: Tammi Sou, MD Admit Date: 02/19/2021  Reason for Consultation/Follow-up: Establishing goals of care  Subjective: I saw Mitchell Abbott.  He is lying in bed in no distress.  Remains nonverbal during my encounter.  I called and was able to reach patient's wife, Mitchell Abbott.  We discussed clinical course as well as wishes moving forward in regard to advanced directives.  Concepts specific to code status and rehospitalization discussed.  We discussed difference between a aggressive medical intervention path and a palliative, comfort focused care path.  Values and goals of care important to patient and family were attempted to be elicited.  She reports that she understands that Mitchell Abbott will continue to have decline in his nutrition, cognition, and functional status as his comorbidities progress.  Is to work to transition him home at time of discharge from the hospital.  While she understands that these things will continue to progress, she feels he is benefiting from hospitalization and plans to bring him back the next time he is ill.  Concept of Hospice and Palliative Care were discussed.   Questions and concerns addressed.   PMT will continue to support holistically.   Length of Stay: 5  Current Medications: Scheduled Meds:  . aspirin EC  81 mg Oral Daily  . Chlorhexidine Gluconate Cloth  6 each Topical Daily  . enoxaparin (LOVENOX) injection  40 mg Subcutaneous Q24H  . feeding supplement  1 Container Oral Q24H  . feeding supplement  237 mL Oral BID BM  . Gerhardt's butt cream   Topical  BID  . mupirocin cream   Topical BID  . mupirocin ointment  1 application Nasal BID  . tamsulosin  0.4 mg Oral QHS    Continuous Infusions: . vancomycin 750 mg (02/23/21 1315)    PRN Meds: acetaminophen **OR** acetaminophen, food thickener  Physical Exam         General: Alert, awake, in no acute distress.  Chronically ill-appearing.  Nonverbal. HEENT: No bruits, no goiter, no JVD Heart: Regular rate and rhythm. No murmur appreciated. Lungs: Good air movement, clear Abdomen: Soft, nontender, nondistended, positive bowel sounds.  Ext: No significant edema Skin: Warm and dry  Vital Signs: BP 128/81 (BP Location: Right Arm)   Pulse 71   Temp 98.4 F (36.9 C)   Resp 20   Ht 5\' 9"  (1.753 m) Comment: estimate  Wt 60 kg   SpO2 97%   BMI 19.53 kg/m  SpO2: SpO2: 97 % O2 Device: O2 Device: Room Air O2 Flow Rate:    Intake/output summary:  Intake/Output Summary (Last 24 hours) at 02/24/2021 0544 Last data filed at 02/23/2021 2100 Gross per 24 hour  Intake --  Output 650 ml  Net -650 ml   LBM: Last BM Date: 02/23/21 Baseline Weight: Weight: 60 kg Most recent weight: Weight: 60 kg       Palliative Assessment/Data:    Flowsheet Rows    Flowsheet Row Most Recent Value  Intake Tab   Referral Department Hospitalist  Unit at Time of Referral Med/Surg Unit  Palliative Care Primary Diagnosis Sepsis/Infectious Disease  Date Notified 02/21/21  Palliative Care Type New Palliative care  Reason for referral Clarify Goals of Care  Date of Admission 02/19/21  Date first seen by Palliative Care 02/21/21  # of days Palliative referral response time 0 Day(s)  # of days IP prior to Palliative referral 2  Clinical Assessment   Palliative Performance Scale Score 30%  Psychosocial & Spiritual Assessment   Palliative Care Outcomes   Patient/Family meeting held? No       Patient Active Problem List   Diagnosis Date Noted  . Pressure injury of skin 02/21/2021  . MRSA  bacteremia   . Soft tissue infection   . Complicated UTI (urinary tract infection) 02/19/2021  . Generalized weakness 02/19/2021  . Hyponatremia 02/19/2021  . Protein-calorie malnutrition, severe 01/13/2021  . Sepsis secondary to UTI ([place]) 01/12/2021  . Seborrheic keratosis 12/26/2020  . Lip swelling 02/23/2020  . Hearing difficulty of both ears 08/21/2019  . Urethral bleeding 09/02/2018  . Rash 07/25/2018  . Syncope 06/27/2016  . Maxillary sinusitis 05/27/2015  . Apathy 01/28/2015  . Allergic rhinitis 01/20/2015  . Bacterial conjunctivitis of left eye 11/05/2014  . Alzheimer's disease with late onset ([place]) 01/15/2014  . Chronic renal insufficiency, stage II (mild) 09/03/2013  . Observation for suspected malignant neoplasm 09/03/2013  . Preventative health care 09/03/2013  . Orthostatic syncope 02/05/2013  . Mild cognitive impairment 10/20/2012  . Cervical myofascial strain 08/15/2012  . Sacroiliac joint pain 02/18/2012  . History of TIA (transient ischemic attack)   . Benign prostatic hyperplasia 10/13/2011  . Headache disorder 10/13/2011  . Dementia ([place]) 10/13/2011    Palliative Care Assessment & Plan   Patient Profile: 87 y.o. male  with past medical history of dementia, chronic indwelling Foley catheter, TIA, skin cancer, BPH admitted on 02/19/2021 with fatigue and fever.  He was seen recently for soft tissue abscess near scrotum and this admission was found to have MSRA bacteremia.  He is being followed by infectious disease with recommendation for 2-week treatment of antibiotics.  He did have CT performed that showed no evidence of abscess.  Palliative consulted for goals of care.  Recommendations/Plan: DNR/DNI Continue current interventions.  While she understands that Mitchell Abbott has chronic progressive illness and will continue to decline in his nutrition, cognition, and functional status, she still feels that he is benefiting from return trips to the hospital.  She  would like to work to take him home at time of discharge is not interested in skilled facility or hospice services at this  point in time.   Code Status:    Code Status Orders  (From admission, onward)           Start     Ordered   02/20/21 0002  Do not attempt resuscitation (DNR)  Continuous       Question Answer Comment  In the event of cardiac or respiratory ARREST Do not call a "code blue"   In the event of cardiac or respiratory ARREST Do not perform Intubation, CPR, defibrillation or ACLS   In the event of cardiac or respiratory ARREST Use medication by any route, position, wound care, and other measures to relive pain and suffering. May use oxygen, suction and manual treatment of airway obstruction as needed for comfort.      02/20/21 0001           Code Status History     Date Active Date Inactive Code Status Order ID Comments User Context   01/12/2021 1700 01/18/2021 0211 DNR [id number]  Dessa Phi, DO Inpatient   06/27/2016 1016 06/28/2016 2228 Full Code [id number]  Patrecia Pour, MD Inpatient       Prognosis: Guarded  Discharge Planning: Home with [facility] was discussed with wife  Thank you for allowing the Palliative Medicine Team to assist in the care of this patient.   Total Time 40 Prolonged Time Billed No      Greater than 50%  of this time was spent counseling and coordinating care related to the above assessment and plan.  Micheline Rough, MD  Please contact Palliative Medicine Team phone at [phone] for questions and concerns.

## 2021-02-24 NOTE — Progress Notes (Signed)
Patient ID: Mitchell Abbott, male   DOB: 01-25-1924, 87 y.o.   MRN: [id number]  PROGRESS NOTE    Mitchell Abbott  YHC:[id number] DOB: 1924/02/12 DOA: 02/19/2021 PCP: Tammi Sou, MD   Brief Narrative:   87 y.o. male with medical history significant for dementia, chronic indwelling Foley catheter, history of TIA and skin cancers, BPH who presented with generalized weakness and worsening fatigue along with fever.  On presentation, WBC was 10,400.  UA was suggestive of UTI.  He was started on broad-spectrum antibiotics.  He was found to have MRSA bacteremia.  CT of the abdomen and pelvis did not show evidence of abscess.  ID was consulted and antibiotics changed to IV vancomycin alone.  2D echo was negative for vegetations.  Assessment & Plan:   Complicated UTI in a patient with chronic indwelling Foley catheter: Possibly related to Foley catheter Scrotal/groin cellulitis MRSA bacteremia -Foley catheter changed on presentation.  Currently on vancomycin.  Urine culture had grown Pseudomonas in the past -Blood cultures growing MRSA.  Urine culture growing multiple species.  Repeat blood cultures from 02/21/2021 are negative so far. -2D echo showed no vegetations. -DC'd cefepime on 02/22/2021.  ID recommended to continue IV vancomycin in the hospital and discharged on oral linezolid to finish 2-week course of antibiotic treatment, considering 02/21/2021 as first day of antibiotic treatment and last day of antibiotic treatment would be 03/07/2021.  Outpatient follow-up with Dr. Gale Journey -No temperature spikes currently.  CT of the abdomen and pelvis did not show evidence of abscess. -Off IV fluids -Continue Flomax -Wife will make arrangements for discharge home tomorrow.  Continue IV vancomycin for today and possible discharge in a.m. tomorrow on oral Zyvox.  Generalized weakness Dementia -PT recommended SNF placement.  Wife not interested in SNF placement; subsequently PT is recommending home  health PT -Palliative care evaluation and follow-up appreciated.  Leukocytosis -Resolved.  No labs today  Anemia of chronic disease -Possibly from chronic illnesses.  Hemoglobin stable  Hyponatremia -Resolved  DVT prophylaxis: Lovenox Code Status: DNR Family Communication: Spoke to wife at bedside on 02/24/2021 Disposition Plan: Status is: Inpatient  Remains inpatient appropriate because:Inpatient level of care appropriate due to severity of illness  Dispo: The patient is from: Home              Anticipated d/c is to: Home tomorrow if remains clinically stable.              Patient currently is not medically stable to d/c.   Difficult to place patient No  Consultants: ID  Procedures: Echo as below  Antimicrobials:  Anti-infectives (From admission, onward)    Start     Dose/Rate Route Frequency Ordered Stop   02/22/21 0600  ceFEPIme (MAXIPIME) 2 g in sodium chloride 0.9 % 100 mL IVPB  Status:  Discontinued        2 g 200 mL/hr over 30 Minutes Intravenous Every 12 hours 02/21/21 1931 02/22/21 1433   02/21/21 1400  ceFEPIme (MAXIPIME) 2 g in sodium chloride 0.9 % 100 mL IVPB  Status:  Discontinued        2 g 200 mL/hr over 30 Minutes Intravenous Every 8 hours 02/21/21 1130 02/21/21 1931   02/21/21 1000  vancomycin (VANCOREADY) IVPB 750 mg/150 mL        750 mg 150 mL/hr over 60 Minutes Intravenous Every 24 hours 02/20/21 1029     02/20/21 1200  cefTRIAXone (ROCEPHIN) 1 g in sodium chloride 0.9 % 100 mL  IVPB  Status:  Discontinued        1 g 200 mL/hr over 30 Minutes Intravenous Every 24 hours 02/20/21 0001 02/20/21 0002   02/20/21 1200  vancomycin (VANCOCIN) IVPB 1000 mg/200 mL premix        1,000 mg 200 mL/hr over 60 Minutes Intravenous  Once 02/20/21 1029 02/20/21 1244   02/20/21 0200  ceFEPIme (MAXIPIME) 2 g in sodium chloride 0.9 % 100 mL IVPB  Status:  Discontinued        2 g 200 mL/hr over 30 Minutes Intravenous Every 12 hours 02/20/21 0002 02/21/21 1025   02/19/21  2230  cefTRIAXone (ROCEPHIN) 1 g in sodium chloride 0.9 % 100 mL IVPB        1 g 200 mL/hr over 30 Minutes Intravenous  Once 02/19/21 2218 02/19/21 2257        Subjective: Patient seen and examined at bedside.  Awake, confused.  No fever, worsening shortness of breath or agitation reported. objective: Vitals:   02/23/21 0524 02/23/21 1303 02/23/21 2251 02/24/21 0500  BP: 127/64 (!) 146/81 128/81 (!) 165/86  Pulse: 67 75 71 84  Resp: 17 18 20 20   Temp: 98.6 F (37 C) 97.6 F (36.4 C) 98.4 F (36.9 C) 98.8 F (37.1 C)  TempSrc: Oral   Oral  SpO2: 98% 99% 97% 96%  Weight:      Height:        Intake/Output Summary (Last 24 hours) at 02/24/2021 0944 Last data filed at 02/24/2021 0700 Gross per 24 hour  Intake --  Output 1025 ml  Net -1025 ml    Filed Weights   02/20/21 1100 02/20/21 1157  Weight: 60 kg 60 kg    Examination:  General exam: Elderly male sitting on chair.  Looks chronically ill and deconditioned.  No distress.  Currently on room air.  Awake, confused. Respiratory system: Decreased breath sounds at bases bilaterally cardiovascular system: Rate controlled, S1-S2 heard  gastrointestinal system: Abdomen is distended slightly, soft and nontender.  Bowel sounds are heard  extremities: No edema or clubbing   Data Reviewed: I have personally reviewed following labs and imaging studies  CBC: Recent Labs  Lab 02/19/21 2125 02/20/21 0334 02/21/21 0318 02/22/21 0256 02/23/21 0901  WBC 10.4 12.1* 9.5 7.8 7.0  NEUTROABS 8.0*  --  6.9 4.9 4.5  HGB 11.3* 11.8* 11.3* 10.8* 11.6*  HCT 35.0* 36.4* 36.1* 33.9* 35.7*  MCV 97.0 96.0 98.1 98.0 95.7  PLT 332 339 306 325 092    Basic Metabolic Panel: Recent Labs  Lab 02/20/21 0334 02/21/21 0318 02/22/21 0256 02/23/21 0901 02/24/21 0403  NA 136 138 140 139 137  K 4.3 4.4 4.5 3.9 4.5  CL 103 107 108 110 106  CO2 21* 23 25 24 25   GLUCOSE 135* 104* 94 122* 95  BUN 14 12 16 14 15   CREATININE 0.69 0.84 0.81 0.66  0.71  CALCIUM 8.5* 8.7* 8.5* 8.7* 8.6*  MG  --  2.0 2.1 2.1 2.1    GFR: Estimated Creatinine Clearance: 47.9 mL/min (by C-G formula based on SCr of 0.71 mg/dL). Liver Function Tests: Recent Labs  Lab 02/19/21 2125  AST 20  ALT 22  ALKPHOS 84  BILITOT 1.0  PROT 6.8  ALBUMIN 2.8*    No results for input(s): LIPASE, AMYLASE in the last 168 hours. No results for input(s): AMMONIA in the last 168 hours. Coagulation Profile: Recent Labs  Lab 02/19/21 2125  INR 1.1    Cardiac Enzymes:  No results for input(s): CKTOTAL, CKMB, CKMBINDEX, TROPONINI in the last 168 hours. BNP (last 3 results) No results for input(s): PROBNP in the last 8760 hours. HbA1C: No results for input(s): HGBA1C in the last 72 hours. CBG: No results for input(s): GLUCAP in the last 168 hours. Lipid Profile: No results for input(s): CHOL, HDL, LDLCALC, TRIG, CHOLHDL, LDLDIRECT in the last 72 hours. Thyroid Function Tests: No results for input(s): TSH, T4TOTAL, FREET4, T3FREE, THYROIDAB in the last 72 hours. Anemia Panel: No results for input(s): VITAMINB12, FOLATE, FERRITIN, TIBC, IRON, RETICCTPCT in the last 72 hours. Sepsis Labs: Recent Labs  Lab 02/19/21 2125  LATICACIDVEN 1.3     Recent Results (from the past 240 hour(s))  Blood culture (routine single)     Status: Abnormal   Collection Time: 02/19/21  9:25 PM   Specimen: BLOOD  Result Value Ref Range Status   Specimen Description   Final    BLOOD SPECIMEN SOURCE NOT MARKED ON REQUISITION Performed at [facility], [place] [address], [place], [place] [address]    Special Requests   Final    BOTTLES DRAWN AEROBIC AND ANAEROBIC Blood Culture adequate volume Performed at [facility] [address] [place], [place] [address]    Culture  Setup Time   Final    GRAM POSITIVE COCCI IN BOTH AEROBIC AND ANAEROBIC BOTTLES CRITICAL RESULT CALLED TO, READ BACK BY AND VERIFIED WITH: PHARMD SCOTT CHRISTY  02/20/21@21 :17 BY TW Performed at [facility], [place] [address], [place], [place] [address]    Culture METHICILLIN RESISTANT STAPHYLOCOCCUS AUREUS (A)  Final   Report Status 02/23/2021 FINAL  Final   Organism ID, Bacteria METHICILLIN RESISTANT STAPHYLOCOCCUS AUREUS  Final      Susceptibility   Methicillin resistant staphylococcus aureus - MIC*    CIPROFLOXACIN >=8 RESISTANT Resistant     ERYTHROMYCIN >=8 RESISTANT Resistant     GENTAMICIN <=0.5 SENSITIVE Sensitive     OXACILLIN >=4 RESISTANT Resistant     TETRACYCLINE >=16 RESISTANT Resistant     VANCOMYCIN <=0.5 SENSITIVE Sensitive     TRIMETH/SULFA >=320 RESISTANT Resistant     CLINDAMYCIN <=0.25 SENSITIVE Sensitive     RIFAMPIN <=0.5 SENSITIVE Sensitive     Inducible Clindamycin NEGATIVE Sensitive     * METHICILLIN RESISTANT STAPHYLOCOCCUS AUREUS  Urine Culture     Status: Abnormal   Collection Time: 02/19/21  9:25 PM   Specimen: In/Out Cath Urine  Result Value Ref Range Status   Specimen Description   Final    IN/OUT CATH URINE Performed at [facility] [address], [place], [place] [address]    Special Requests   Final    NONE Performed at [facility], [place] [address], [place],  [address]    Culture MULTIPLE SPECIES PRESENT, SUGGEST RECOLLECTION (A)  Final   Report Status 02/21/2021 FINAL  Final  Resp Panel by RT-PCR (Flu A&B, Covid) Nasopharyngeal Swab     Status: None   Collection Time: 02/19/21  9:25 PM   Specimen: Nasopharyngeal Swab; Nasopharyngeal(NP) swabs in vial transport medium  Result Value Ref Range Status   SARS Coronavirus 2 by RT PCR NEGATIVE NEGATIVE Final    Comment: (NOTE) SARS-CoV-2 target nucleic acids are NOT DETECTED.  The SARS-CoV-2 RNA is generally detectable in upper respiratory specimens during the acute phase of infection. The lowest concentration of SARS-CoV-2 viral copies this assay can detect is 138 copies/mL. A negative result does not  preclude  SARS-Cov-2 infection and should not be used as the sole basis for treatment or other patient management decisions. A negative result may occur with  improper specimen collection/handling, submission of specimen other than nasopharyngeal swab, presence of viral mutation(s) within the areas targeted by this assay, and inadequate number of viral copies(<138 copies/mL). A negative result must be combined with clinical observations, patient history, and epidemiological information. The expected result is Negative.  Fact Sheet for Patients:  EntrepreneurPulse.com.au  Fact Sheet for Healthcare Providers:  IncredibleEmployment.be  This test is no t yet approved or cleared by the [place] FDA and  has been authorized for detection and/or diagnosis of SARS-CoV-2 by FDA under an Emergency Use Authorization (EUA). This EUA will remain  in effect (meaning this test can be used) for the duration of the COVID-19 declaration under Section 564(b)(1) of the Act, 21 U.S.C.section 360bbb-3(b)(1), unless the authorization is terminated  or revoked sooner.       Influenza A by PCR NEGATIVE NEGATIVE Final   Influenza B by PCR NEGATIVE NEGATIVE Final    Comment: (NOTE) The Xpert Xpress SARS-CoV-2/FLU/RSV plus assay is intended as an aid in the diagnosis of influenza from Nasopharyngeal swab specimens and should not be used as a sole basis for treatment. Nasal washings and aspirates are unacceptable for Xpert Xpress SARS-CoV-2/FLU/RSV testing.  Fact Sheet for Patients: EntrepreneurPulse.com.au  Fact Sheet for Healthcare Providers: IncredibleEmployment.be  This test is not yet approved or cleared by the [place] FDA and has been authorized for detection and/or diagnosis of SARS-CoV-2 by FDA under an Emergency Use Authorization (EUA). This EUA will remain in effect (meaning this test can be used) for the  duration of the COVID-19 declaration under Section 564(b)(1) of the Act, 21 U.S.C. section 360bbb-3(b)(1), unless the authorization is terminated or revoked.  Performed at [facility], [place] [address], [place], [place] [address]   Blood Culture ID Panel (Reflexed)     Status: Abnormal   Collection Time: 02/19/21  9:25 PM  Result Value Ref Range Status   Enterococcus faecalis NOT DETECTED NOT DETECTED Final   Enterococcus Faecium NOT DETECTED NOT DETECTED Final   Listeria monocytogenes NOT DETECTED NOT DETECTED Final   Staphylococcus species DETECTED (A) NOT DETECTED Final    Comment: CRITICAL RESULT CALLED TO, READ BACK BY AND VERIFIED WITH: PHARMD SCOTT CHRISTY 02/20/21@21 :17 BY TW    Staphylococcus aureus (BCID) DETECTED (A) NOT DETECTED Final    Comment: Methicillin (oxacillin)-resistant Staphylococcus aureus (MRSA). MRSA is predictably resistant to beta-lactam antibiotics (except ceftaroline). Preferred therapy is vancomycin unless clinically contraindicated. Patient requires contact precautions if  hospitalized. CRITICAL RESULT CALLED TO, READ BACK BY AND VERIFIED WITH: PHARMD SCOTT CHRISTY 02/20/21@21 :17 BY TW    Staphylococcus epidermidis NOT DETECTED NOT DETECTED Final   Staphylococcus lugdunensis NOT DETECTED NOT DETECTED Final   Streptococcus species NOT DETECTED NOT DETECTED Final   Streptococcus agalactiae NOT DETECTED NOT DETECTED Final   Streptococcus pneumoniae NOT DETECTED NOT DETECTED Final   Streptococcus pyogenes NOT DETECTED NOT DETECTED Final   A.calcoaceticus-baumannii NOT DETECTED NOT DETECTED Final   Bacteroides fragilis NOT DETECTED NOT DETECTED Final   Enterobacterales NOT DETECTED NOT DETECTED Final   Enterobacter cloacae complex NOT DETECTED NOT DETECTED Final   Escherichia coli NOT DETECTED NOT DETECTED Final   Klebsiella aerogenes NOT DETECTED NOT DETECTED Final   Klebsiella oxytoca NOT DETECTED NOT DETECTED Final   Klebsiella  pneumoniae NOT DETECTED NOT DETECTED Final   Proteus species NOT DETECTED NOT  DETECTED Final   Salmonella species NOT DETECTED NOT DETECTED Final   Serratia marcescens NOT DETECTED NOT DETECTED Final   Haemophilus influenzae NOT DETECTED NOT DETECTED Final   Neisseria meningitidis NOT DETECTED NOT DETECTED Final   Pseudomonas aeruginosa NOT DETECTED NOT DETECTED Final   Stenotrophomonas maltophilia NOT DETECTED NOT DETECTED Final   Candida albicans NOT DETECTED NOT DETECTED Final   Candida auris NOT DETECTED NOT DETECTED Final   Candida glabrata NOT DETECTED NOT DETECTED Final   Candida krusei NOT DETECTED NOT DETECTED Final   Candida parapsilosis NOT DETECTED NOT DETECTED Final   Candida tropicalis NOT DETECTED NOT DETECTED Final   Cryptococcus neoformans/gattii NOT DETECTED NOT DETECTED Final   Meth resistant mecA/C and MREJ DETECTED (A) NOT DETECTED Final    Comment: CRITICAL RESULT CALLED TO, READ BACK BY AND VERIFIED WITH: PHARMD SCOTT CHRISTY 02/20/21@21 :17 BY TW Performed at [facility] [address], [place], [place] [address]   Culture, blood (routine x 2)     Status: None (Preliminary result)   Collection Time: 02/21/21 11:40 AM   Specimen: BLOOD  Result Value Ref Range Status   Specimen Description   Final    BLOOD BLOOD LEFT HAND Performed at [facility] [address] [place], [place] [address]    Special Requests   Final    BOTTLES DRAWN AEROBIC AND ANAEROBIC Blood Culture adequate volume Performed at [facility] [address] [place], [place] [address]    Culture   Final    NO GROWTH 2 DAYS Performed at [facility] [address], [place], [place] [address]    Report Status PENDING  Incomplete  Culture, blood (routine x 2)     Status: None (Preliminary result)   Collection Time: 02/21/21 11:40 AM   Specimen: BLOOD  Result Value Ref Range Status   Specimen Description   Final    BLOOD RIGHT  ANTECUBITAL Performed at [facility] [address] [place], [place] [address]    Special Requests   Final    BOTTLES DRAWN AEROBIC ONLY Blood Culture adequate volume Performed at [facility] [address], [place], [place] [address]    Culture   Final    NO GROWTH 2 DAYS Performed at [facility] [address] [place],  [address]    Report Status PENDING  Incomplete          Radiology Studies: No results found.      Scheduled Meds:  aspirin EC  81 mg Oral Daily   Chlorhexidine Gluconate Cloth  6 each Topical Daily   enoxaparin (LOVENOX) injection  40 mg Subcutaneous Q24H   feeding supplement  1 Container Oral Q24H   feeding supplement  237 mL Oral BID BM   Gerhardt's butt cream   Topical BID   mupirocin cream   Topical BID   mupirocin ointment  1 application Nasal BID   tamsulosin  0.4 mg Oral QHS   Continuous Infusions:  vancomycin 750 mg (02/23/21 1315)          Aline August, MD [facility] 02/24/2021, 9:44 AM

## 2021-02-24 NOTE — Care Management Important Message (Signed)
Important Message  Patient Details IM Letter placed in Patient's room for Wife [name] Horsman. Name: SELASSIE SPATAFORE MRN: [id number] Date of Birth: 1923/08/04   Medicare Important Message Given:  Yes     [name] 02/24/2021, 11:00 AM

## 2021-02-24 NOTE — TOC Progression Note (Signed)
Transition of Care [facility]) - Progression Note   Patient Details  Name: Mitchell Abbott MRN: [id number] Date of Birth: 04/06/1924  Transition of Care [facility]) CM/SW [place], LCSW Phone Number: 02/24/2021, 2:46 PM  Clinical Narrative: Wife is agreeable to HHPT as patient will discharge with PO antibiotics. Wife requested a different agency than [place]. CSW made referral to [facility] with [place]. Orders in. TOC to follow.  Expected Discharge Plan: [place] Barriers to Discharge: Continued Medical Work up  Expected Discharge Plan and Services Expected Discharge Plan: [place] In-house Referral: Clinical Social Work Post Acute Care Choice: [place] Living arrangements for the past 2 months: Single Family Home              DME Arranged: N/A DME Agency: NA HH Agency: [place] Date HH Agency Contacted: 02/24/21 Time [place]: [id number] Representative spoke with at [place]: [place]  Readmission Risk Interventions No flowsheet data found.

## 2021-02-24 NOTE — TOC Transition Note (Signed)
Pharmacy note - transitions of care  Arranged for a 10 day supply of linezolid to be delivered to the patient's room from [facility] today.  Discussed with wife [name] on how to take the medication after discharge.  She was appreciative and expressed understanding.  Heide Guile, PharmD, BCPS-AQ ID Clinical Pharmacist

## 2021-02-24 NOTE — Evaluation (Signed)
Clinical/Bedside Swallow Evaluation Patient Details  Name: Mitchell Abbott MRN: [id number] Date of Birth: [birth year]  Today's Date: 02/24/2021 Time: SLP Start Time (ACUTE ONLY): 54 SLP Stop Time (ACUTE ONLY): 9678 SLP Time Calculation (min) (ACUTE ONLY): 35 min  Past Medical History:  Past Medical History:  Diagnosis Date   Actinic keratoses    Adrenal hemorrhage ([place]) 2001   s/p fall from ladder--a f/u CT abd showed resolution of the hemorrhage- (duodenal polyp incidentally noted;  f/u EGD by Dr. Watt Climes 03/2000 showed small lipomatous mass in duodenum which was confirmed by biopsy.   Alzheimer's dementia ([place])    with behav disturbance and apathy.  [facility] referral as of 07/2013.  They added namenda 07/2014 due to pt's signif decline in the prior 6 mo.   Arthralgia of knee, right 2009   exam suspicious for meniscal origin   BPH (benign prostatic hypertrophy)    History of acute urinary retention (2006)  Hx of elevated PSA (7.26) + right sided prostate nodule on DRE by urologist.  PSA did not go down with tx with antibiotics--biopsy was done and showed NO MALIGNANCY but some chronic inflammation was present--turned out related to acute cystitis (enterococcus UTI).  PSA was 1.17 in 07/2011 at urologist's (Dr. Karsten Ro).  F/u at urol is now prn as of 08/2014.   BPPV (benign paroxysmal positional vertigo)    COVID-19 virus infection 06/09/2020   DDD (degenerative disc disease), cervical    C5-6, C6-7 primarily   Headache in back of head    Neurologist treated him with PT for this problem and it helped   Hearing deficit    Bilateral; noise damage in [employer]  Has had hearing aids since about 2008.   Herpes zoster 07/25/2018   suspected--valtrex rx'd.   History of acute cystitis 08/2009   with acute prostatitis (enterococcus faecalis)   History of solitary pulmonary nodule    Initially noted on CT abd for his adrenal hemorrhage s/p fall from ladder 2001.  F/u CT chest 04/2000  showed that it had shrunk from 60mm to 75mm and had no malignant features.   History of TIA (transient ischemic attack) @ 2008   02/2001 and 01/2006 carotid dopplers showed 50% or less stenosis.   Left bundle branch hemiblock    mentioned in dx of old records 04/15/2009   Migraine headache with aura onset in teens   No migraines since about 2004   Poor dentition    As of 07/2019 being referred to oral surgeon for removal of all implants and all remaining teeth   RSV bronchitis 01/2020   Skin cancer    Past Surgical History:  Past Surgical History:  Procedure Laterality Date   Carotid dopplers  05/2016   1-39% stenosis bilat ICA.   cataract extrac     dental     implants   PROSTATE BIOPSY  04/07/2005   For elevated PSA + right sided prostate nodule on DRE by urologist:  benign biopsy.   TONSILLECTOMY  preteen   TRANSTHORACIC ECHOCARDIOGRAM  06/28/2016   EF 60-65%, mild dilatation of ascending aorta.   HPI:  87yo male admitted 02/19/21 with UTI, weakness, fatigue. PMH: advanced dementia (nonverbal), chronic indwelling Foley catheter, history of TIA and skin cancers, BPH    Assessment / Plan / Recommendation  Clinical Impression  Pt presents with increased risk of aspiration due to inability to follow commands. Oral motor strength and function appear adequate. Pt's wife reports 7 lower teeth and full  upper denture. She was feeding pt breakfast upon arrival of SLP, providing small bites and sips of regular textures/nectar thick liquid in a quiet environment. Occasionally, pt would cough after nectar thick liquid was used to facilitate clearing of the oral cavity. No cough noted when taking large/continuous boluses of nectar thick liquid in the absence of food. SLP encouraged consideration of downgrading diet to a more conservative/safer (yet more restrictive) texture. Wife indicated she would think about it, but wants to continue regular solids and nectar thick liquids at this time. Will  continue current diet and encourage small bites and sips, avoid liquids immediately following solid boluses. Pt's wife asked about purchasing suction machine for use at home. It would be worth investigating to facilitate oral care and removal of residue or secretions poorly managed. SLP will follow acutely to assess diet tolerance and continue education. Safe swallow precautions posted at [facility]. Wife was provided with simply thick starter boxes to take home.  SLP Visit Diagnosis: Dysphagia, unspecified (R13.10)    Aspiration Risk  Moderate aspiration risk    Diet Recommendation Dysphagia 2 (Fine chop);Nectar-thick liquid   Liquid Administration via: Cup;Straw Medication Administration:  (small pills whole with puree, crush large pills) Supervision: Patient able to self feed;Full supervision/cueing for compensatory strategies;Staff to assist with self feeding Compensations: Minimize environmental distractions;Slow rate;Small sips/bites Postural Changes: Seated upright at 90 degrees;Remain upright for at least 30 minutes after po intake    Other  Recommendations Oral Care Recommendations: Oral care BID Other Recommendations: Order thickener from pharmacy;Have oral suction available    Recommendations for follow up therapy are one component of a multi-disciplinary discharge planning process, led by the attending physician.  Recommendations may be updated based on patient status, additional functional criteria and insurance authorization.  Follow up Recommendations 24 hour supervision/assistance      Frequency and Duration min 1 x/week  1 week;2 weeks       Prognosis Prognosis for Safe Diet Advancement: [place] Barriers to Reach Goals: Cognitive deficits      Swallow Study   General Date of Onset: 02/19/21 HPI: 87yo male admitted 02/19/21 with UTI, weakness, fatigue. PMH: advanced dementia (nonverbal), chronic indwelling Foley catheter, history of TIA and skin cancers, BPH Type of Study:  Bedside Swallow Evaluation Previous Swallow Assessment: BSE - 01/13/21, rec Dys3/NTL Diet Prior to this Study: Dysphagia 3 (soft);Nectar-thick liquids Temperature Spikes Noted: No Respiratory Status: Room air History of Recent Intubation: No Behavior/Cognition: Alert;Cooperative;Doesn't follow directions;Requires cueing Oral Cavity Assessment: Within Functional Limits Oral Care Completed by SLP: No Oral Cavity - Dentition: Adequate natural dentition;Dentures, top Vision: Functional for self-feeding Self-Feeding Abilities: Able to feed self;Needs assist Patient Positioning: Upright in chair Baseline Vocal Quality: Normal Volitional Cough: Cognitively unable to elicit Volitional Swallow: Unable to elicit    Oral/Motor/Sensory Function Overall Oral Motor/Sensory Function: Generalized oral weakness   Ice Chips Ice chips: Not tested   Thin Liquid Thin Liquid: Not tested    Nectar Thick Nectar Thick Liquid: Impaired Presentation: Straw Pharyngeal Phase Impairments: Cough - Immediate Other Comments: cough noted when pt accepted a sip of liquid before solid foods were cleared from oral cavity   Honey Thick Honey Thick Liquid: Not tested   Puree Puree: Within functional limits Presentation: Spoon   Solid     Solid: Impaired Presentation: Spoon Oral Phase Functional Implications: Prolonged oral transit;Oral residue Pharyngeal Phase Impairments: Suspected delayed Swallow Other Comments: cough noted intermittently following nectar thick liquid wash     Jadarion Halbig B. Ercell Razon, MSP,  CCC-SLP Speech Language Pathologist Office: [phone]  Shonna Chock 02/24/2021,10:26 AM

## 2021-02-24 NOTE — Progress Notes (Signed)
Physical Therapy Treatment Patient Details Name: Mitchell Abbott MRN: [id number] DOB: 17-Jan-1924 Today's Date: 02/24/2021   History of Present Illness 87 y.o. male admitted 02/19/21 with generalized weakness, fatigue, fever. Dx of complicated UTI. with medical history significant for dementia, chronic indwelling Foley catheter, history of TIA and skin cancers, BPH    PT Comments    Pt was OOB in recliner eating breakfast.  With spouse, assisted with amb in hallway an increased distance.  General Gait Details: Once upright and prompted by wife plus physical cueing to initiate "steps" pt was able to amb with walker but guided by spouse and Therapist to support for balance with recliner following behind.  Pt tends to push walker too far to front.  Required physical assist to keep posture upright.  Short shuffled steps.  HIGH FALL RISK. Spouse stated pt is at his baseline.  She feeds him and provides 27/7 care with personal Aides as well.   Spouse declines SNF and plans to take him home.    Recommendations for follow up therapy are one component of a multi-disciplinary discharge planning process, led by the attending physician.  Recommendations may be updated based on patient status, additional functional criteria and insurance authorization.  Follow Up Recommendations  Home health PT (spouse wants to take him home - will update LPT)     Equipment Recommendations  None recommended by PT    Recommendations for Other Services       Precautions / Restrictions Precautions Precautions: Fall Precaution Comments: Alzheimer's dementia (non verbal)     Mobility  Bed Mobility               General bed mobility comments: OOB in recliner    Transfers Overall transfer level: Needs assistance Equipment used: Rolling walker (2 wheeled);None Transfers: Sit to/from Stand Sit to Stand: Mod assist;+2 physical assistance Stand pivot transfers: Mod assist;+2 safety/equipment        General transfer comment: spouse demonstared she she stands in front of pt and blocks his feet from "skiing" with hers and pulls him up by his arms.  Pt responded well to this tech.  Initial posterior lean/push.  Once upright, pt was able to support his own weight and stand partial erect.  Ambulation/Gait Ambulation/Gait assistance: Min assist;+2 safety/equipment Gait Distance (Feet): 85 Feet Assistive device: Rolling walker (2 wheeled) Gait Pattern/deviations: Step-to pattern;Step-through pattern;Narrow base of support;Ataxic;Festinating;Shuffle Gait velocity: decreased   General Gait Details: Once upright and prompted by wife plus physical cueing to initiate "steps" pt was able to amb with walker but guided by spouse and Therapist to support for balance with recliner following behind.  Pt tends to push walker too far to front.  Required physical assist to keep posture upright.  Short shuffled steps.  HIGH FALL RISK.   Stairs             Wheelchair Mobility    Modified Rankin (Stroke Patients Only)       Balance                                            Cognition Arousal/Alertness: Awake/alert Behavior During Therapy: WFL for tasks assessed/performed Overall Cognitive Status: History of cognitive impairments - at baseline  General Comments: non verbal, responds well to wife's commands.  Good eye contact.      Exercises      General Comments        Pertinent Vitals/Pain Pain Assessment: No/denies pain    Home Living                      Prior Function            PT Goals (current goals can now be found in the care plan section) Progress towards PT goals: Progressing toward goals    Frequency    Min 3X/week      PT Plan Current plan remains appropriate    Co-evaluation              AM-PAC PT "6 Clicks" Mobility   Outcome Measure  Help needed turning from your back  to your side while in a flat bed without using bedrails?: A Lot Help needed moving from lying on your back to sitting on the side of a flat bed without using bedrails?: A Lot Help needed moving to and from a bed to a chair (including a wheelchair)?: A Lot Help needed standing up from a chair using your arms (e.g., wheelchair or bedside chair)?: A Lot Help needed to walk in hospital room?: A Lot Help needed climbing 3-5 steps with a railing? : Total 6 Click Score: 11    End of Session Equipment Utilized During Treatment: Gait belt Activity Tolerance: Patient tolerated treatment well;No increased pain Patient left: in chair;with call bell/phone within reach;with chair alarm set;with family/visitor present Nurse Communication: Mobility status PT Visit Diagnosis: Difficulty in walking, not elsewhere classified (R26.2);History of falling (Z91.81);Other abnormalities of gait and mobility (R26.89);Unsteadiness on feet (R26.81)     Time: 2482-5003 PT Time Calculation (min) (ACUTE ONLY): 28 min  Charges:  $Gait Training: 23-37 mins                     {Zahirah Cheslock  PTA Acute  Rehabilitation Services Pager      [phone] Office      [phone]

## 2021-02-24 NOTE — Progress Notes (Signed)
[facility]) Hospital Liaison note:  This patient is currently enrolled in [facility] outpatient-based Palliative Care. Will continue to follow for disposition.  Please call with any outpatient palliative questions or concerns.  Thank you, Lorelee Market, LPN [facility] Liaison [phone]

## 2021-02-25 ENCOUNTER — Other Ambulatory Visit (HOSPITAL_COMMUNITY): Payer: Self-pay

## 2021-02-25 DIAGNOSIS — T83511A Infection and inflammatory reaction due to indwelling urethral catheter, initial encounter: Secondary | ICD-10-CM | POA: Diagnosis not present

## 2021-02-25 DIAGNOSIS — E43 Unspecified severe protein-calorie malnutrition: Secondary | ICD-10-CM | POA: Diagnosis not present

## 2021-02-25 DIAGNOSIS — G309 Alzheimer's disease, unspecified: Secondary | ICD-10-CM | POA: Diagnosis not present

## 2021-02-25 DIAGNOSIS — M6281 Muscle weakness (generalized): Secondary | ICD-10-CM | POA: Diagnosis not present

## 2021-02-25 DIAGNOSIS — N401 Enlarged prostate with lower urinary tract symptoms: Secondary | ICD-10-CM | POA: Diagnosis not present

## 2021-02-25 DIAGNOSIS — S3130XA Unspecified open wound of scrotum and testes, initial encounter: Secondary | ICD-10-CM | POA: Diagnosis not present

## 2021-02-25 LAB — BASIC METABOLIC PANEL
Anion gap: 9 (ref 5–15)
BUN: 16 mg/dL (ref 8–23)
CO2: 21 mmol/L — ABNORMAL LOW (ref 22–32)
Calcium: 8.3 mg/dL — ABNORMAL LOW (ref 8.9–10.3)
Chloride: 105 mmol/L (ref 98–111)
Creatinine, Ser: 0.78 mg/dL (ref 0.61–1.24)
GFR, Estimated: >60 mL/min (ref >60)
Glucose, Bld: 116 mg/dL — ABNORMAL HIGH (ref 70–99)
Potassium: 4.2 mmol/L (ref 3.5–5.1)
Sodium: 135 mmol/L (ref 135–145)

## 2021-02-25 LAB — CBC WITH DIFFERENTIAL/PLATELET
Abs Immature Granulocytes: 0.15 10*3/uL — ABNORMAL HIGH (ref 0.00–0.07)
Basophils Absolute: 0.1 10*3/uL (ref 0.0–0.1)
Basophils Relative: 1 %
Eosinophils Absolute: 0.1 10*3/uL (ref 0.0–0.5)
Eosinophils Relative: 1 %
HCT: 36.5 % — ABNORMAL LOW (ref 39.0–52.0)
Hemoglobin: 12 g/dL — ABNORMAL LOW (ref 13.0–17.0)
Immature Granulocytes: 2 %
Lymphocytes Relative: 21 %
Lymphs Abs: 2 10*3/uL (ref 0.7–4.0)
MCH: 31.1 pg (ref 26.0–34.0)
MCHC: 32.9 g/dL (ref 30.0–36.0)
MCV: 94.6 fL (ref 80.0–100.0)
Monocytes Absolute: 0.6 10*3/uL (ref 0.1–1.0)
Monocytes Relative: 7 %
Neutro Abs: 6.7 10*3/uL (ref 1.7–7.7)
Neutrophils Relative %: 68 %
Platelets: UNDETERMINED 10*3/uL (ref 150–400)
RBC: 3.86 MIL/uL — ABNORMAL LOW (ref 4.22–5.81)
RDW: 14 % (ref 11.5–15.5)
WBC: 9.7 10*3/uL (ref 4.0–10.5)
nRBC: 0 % (ref 0.0–0.2)

## 2021-02-25 LAB — C-REACTIVE PROTEIN: CRP: 1.1 mg/dL — ABNORMAL HIGH (ref <1.0)

## 2021-02-25 LAB — MAGNESIUM: Magnesium: 2.1 mg/dL (ref 1.7–2.4)

## 2021-02-25 MED ORDER — NYSTATIN-TRIAMCINOLONE 100000-0.1 UNIT/GM-% EX CREA: TOPICAL_CREAM | Freq: Two times a day (BID) | CUTANEOUS | 0 refills | Status: AC

## 2021-02-25 MED ORDER — NYSTATIN-TRIAMCINOLONE 100000-0.1 UNIT/GM-% EX CREA
TOPICAL_CREAM | Freq: Two times a day (BID) | CUTANEOUS | Status: DC
  Filled 2021-02-25: qty 30

## 2021-02-25 MED ORDER — TAMSULOSIN HCL 0.4 MG PO CAPS: 0.4000 mg | ORAL_CAPSULE | Freq: Every day | ORAL | Status: AC

## 2021-02-25 MED ORDER — SODIUM CHLORIDE 0.9 % IV SOLN
INTRAVENOUS | Status: DC | PRN
  Administered 2021-02-25: 500 mL via INTRAVENOUS

## 2021-02-25 NOTE — TOC Transition Note (Signed)
Transition of Care [facility]) - CM/SW Discharge Note  Patient Details  Name: Mitchell Abbott MRN: [id number] Date of Birth: 08-23-1923  Transition of Care [facility]) CM/SW Contact:  Sherie Don, LCSW Phone Number: 02/25/2021, 1:51 PM  Clinical Narrative: CSW spoke with patient's wife regarding a [place] aide request. CSW notified [place] with [facility] of patient's need. TOC signing off.  Final next level of care: [place] Barriers to Discharge: Barriers Resolved   Patient Goals and CMS Choice Patient states their goals for this hospitalization and ongoing recovery are:: Go to rehab or home with [place] CMS Medicare.gov Compare Post Acute Care list provided to:: Patient Represenative (must comment) Choice offered to / list presented to : Spouse  Discharge Placement                       Discharge Plan and Services In-house Referral: Clinical Social Work   Post Acute Care Choice: [place]          DME Arranged: N/A DME Agency: NA         HH Agency: [place] Date HH Agency Contacted: 02/24/21 Time [place]: 0990 Representative spoke with at [place]: [place] Determinants of Health ([place]) Interventions     Readmission Risk Interventions No flowsheet data found.

## 2021-02-25 NOTE — Progress Notes (Signed)
Physical Therapy Treatment Patient Details Name: Mitchell Abbott MRN: [id number] DOB: [birth year] Today's Date: 02/25/2021   History of Present Illness 87 y.o. male admitted 02/19/21 with generalized weakness, fatigue, fever. Dx of complicated UTI. with medical history significant for dementia, chronic indwelling Foley catheter, history of TIA and skin cancers, BPH     PT Comments      Pt was OOB in recliner eating breakfast.  With spouse, assisted with amb in hallway an increased distance.  General Gait Details: Once upright and prompted by wife plus physical cueing to initiate "steps" pt was able to amb with walker but guided by spouse and Therapist to support for balance with recliner following behind.  Pt tends to push walker too far to front.  Required physical assist to keep posture upright.  Short shuffled steps.  HIGH FALL RISK. Spouse stated pt is at his baseline.  She feeds him and provides 27/7 care with personal Aides as well.   Spouse declines SNF and plans to take him home.    Recommendations for follow up therapy are one component of a multi-disciplinary discharge planning process, led by the attending physician.  Recommendations may be updated based on patient status, additional functional criteria and insurance authorization.   Follow Up Recommendations   Home health PT (spouse wants to take him home - will update LPT)      Equipment Recommendations   None recommended by PT     Recommendations for Other Services        Precautions / Restrictions Precautions Precautions: Fall Precaution Comments: Alzheimer's dementia (non verbal)       Mobility   Bed Mobility General bed mobility comments: OOB in recliner   Transfers Overall transfer level: Needs assistance Equipment used: Rolling walker (2 wheeled);None Transfers: Sit to/from Stand Sit to Stand: Mod assist;+2 physical assistance Stand pivot transfers: Mod assist;+2 safety/equipment General transfer comment:  spouse demonstared she she stands in front of pt and blocks his feet from "skiing" with hers and pulls him up by his arms.  Pt responded well to this tech.  Initial posterior lean/push.  Once upright, pt was able to support his own weight and stand partial erect.   Ambulation/Gait Ambulation/Gait assistance: Min assist;+2 safety/equipment Gait Distance (Feet): 85 Feet Assistive device: Rolling walker (2 wheeled) Gait Pattern/deviations: Step-to pattern;Step-through pattern;Narrow base of support;Ataxic;Festinating;Shuffle Gait velocity: decreased General Gait Details: Once upright and prompted by wife plus physical cueing to initiate "steps" pt was able to amb with walker but guided by spouse and Therapist to support for balance with recliner following behind.  Pt tends to push walker too far to front.  Required physical assist to keep posture upright.  Short shuffled steps.  HIGH FALL RISK.     Stairs     Wheelchair Mobility   Modified Rankin (Stroke Patients Only)        Balance      Cognition Arousal/Alertness: Awake/alert Behavior During Therapy: WFL for tasks assessed/performed Overall Cognitive Status: History of cognitive impairments - at baseline General Comments: non verbal, responds well to wife's commands.  Good eye contact.      Exercises      General Comments        Pertinent Vitals/Pain Pain Assessment: No/denies pain      Home Living       Prior Function        PT Goals (current goals can now be found in the care plan section) Progress towards PT goals: Progressing toward goals  Frequency       Min 3X/week          PT Plan Current plan remains appropriate      Co-evaluation      AM-PAC PT "6 Clicks" Mobility   Outcome Measure   Help needed turning from your back to your side while in a flat bed without using bedrails?: A Lot Help needed moving from lying on your back to sitting on the side of a flat bed without using bedrails?: A  Lot Help needed moving to and from a bed to a chair (including a wheelchair)?: A Lot Help needed standing up from a chair using your arms (e.g., wheelchair or bedside chair)?: A Lot Help needed to walk in hospital room?: A Lot Help needed climbing 3-5 steps with a railing? : Total 6 Click Score: 11      End of Session Equipment Utilized During Treatment: Gait belt Activity Tolerance: Patient tolerated treatment well;No increased pain Patient left: in chair;with call bell/phone within reach;with chair alarm set;with family/visitor present Nurse Communication: Mobility status PT Visit Diagnosis: Difficulty in walking, not elsewhere classified (R26.2);History of falling (Z91.81);Other abnormalities of gait and mobility (R26.89);Unsteadiness on feet (R26.81)       Time: 1194-1740 PT Time Calculation (min) (ACUTE ONLY): 25 min  Charges:  $Gait Training: 23-37 mins                     {Tamzin Bertling  PTA Acute  Rehabilitation [employer]      [phone] Office      [phone]

## 2021-02-25 NOTE — Plan of Care (Signed)
  Problem: Education: Goal: Knowledge of General Education information will improve Description Including pain rating scale, medication(s)/side effects and non-pharmacologic comfort measures Outcome: Progressing   Problem: Health Behavior/Discharge Planning: Goal: Ability to manage health-related needs will improve Outcome: Progressing   

## 2021-02-25 NOTE — Progress Notes (Signed)
Patient has reddened area 10.5 cm W x 8.5 cm H area on left flank that the patient's wife say is new. Applied Mepilex foam. Patient's wife stated she would like someone to assess this area.  Also, patient's wife stated she would like to have help with caring for her husband from a home health service at discharge.

## 2021-02-25 NOTE — Discharge Summary (Signed)
[facility]  Physician Discharge Summary   Patient ID: Mitchell Abbott MRN: [id number] DOB/AGE: Dec 27, 1923 87 y.o.  Admit date: 02/19/2021 Discharge date:   02/25/2021   PCP: Tammi Sou, MD  DISCHARGE DIAGNOSES:  Complicated UTI with chronic indwelling Foley Scrotal cellulitis MRSA bacteremia History of dementia Anemia of chronic disease  RECOMMENDATIONS FOR OUTPATIENT FOLLOW UP: Outpatient follow-up with PCP Palliative care to follow   Home Health: PT Equipment/Devices: None  CODE STATUS: Full code  DISCHARGE CONDITION: fair  Diet recommendation: As before  INITIAL HISTORY: 87 y.o. male with medical history significant for dementia, chronic indwelling Foley catheter, history of TIA and skin cancers, BPH who presented with generalized weakness and worsening fatigue along with fever.  On presentation, WBC was 10,400.  UA was suggestive of UTI.  He was started on broad-spectrum antibiotics.  He was found to have MRSA bacteremia.  CT of the abdomen and pelvis did not show evidence of abscess.  ID was consulted and antibiotics changed to IV vancomycin alone.  2D echo was negative for vegetations.  Consultations: ID  Procedures: ECHO   HOSPITAL COURSE:   Scrotal/groin cellulitis MRSA bacteremia Questionable UTI in a patient with chronic indwelling Foley catheter Foley catheter changed on presentation. Urine culture had grown Pseudomonas in the past. Urine culture growing multiple species.  Patient was initially on cefepime.  After discussions with infectious disease cefepime was discontinued as UTI was not thought to be of significance. Blood cultures grew MRSA.  Patient underwent CT scan of the abdomen pelvis which ruled out any abscess.  Patient was given vancomycin in the hospital.  2D echocardiogram did not show any vegetations.  Repeat cultures were negative.  After discussions with ID patient was changed over to linezolid at discharge. Patient has  been stable.  Afebrile.  WBC has been normal. SNF was recommended by PT but patient's wife wants to take him home instead.  Home health has been ordered.  Remains stable for discharge today.   Dementia Seems to be moderate to advanced.  Does not really communicate.  Seen by palliative care.  They will follow at home.   Anemia of chronic disease Possibly from chronic illnesses.  Hemoglobin stable  Hyponatremia Resolved   Stage II decubitus involving the buttocks Pressure Injury 02/20/21 Buttocks Left;Right Stage 2 -  Partial thickness loss of dermis presenting as a shallow open injury with a red, pink wound bed without slough. (Active)  02/20/21 0335  Location: Buttocks  Location Orientation: Left;Right  Staging: Stage 2 -  Partial thickness loss of dermis presenting as a shallow open injury with a red, pink wound bed without slough.  Wound Description (Comments):   Present on Admission: Yes  Patient wife wanted me to evaluate a rash in his left flank.  Appears to be eczematous.  Cannot rule out fungal infection.  We will give steroid + antifungal cream to apply.   Patient is stable.  Long discussion with patient and wife today.  Answered all of her questions.  Okay for discharge.   PERTINENT LABS:  The results of significant diagnostics from this hospitalization (including imaging, microbiology, ancillary and laboratory) are listed below for reference.    Microbiology: Recent Results (from the past 240 hour(s))  Blood culture (routine single)     Status: Abnormal   Collection Time: 02/19/21  9:25 PM   Specimen: BLOOD  Result Value Ref Range Status   Specimen Description   Final    BLOOD SPECIMEN SOURCE NOT MARKED  ON REQUISITION Performed at [facility], [place] [address], [place], [place] [address]    Special Requests   Final    BOTTLES DRAWN AEROBIC AND ANAEROBIC Blood Culture adequate volume Performed at [facility] [address], [place], [place] [address]    Culture  Setup Time   Final    GRAM POSITIVE COCCI IN BOTH AEROBIC AND ANAEROBIC BOTTLES CRITICAL RESULT CALLED TO, READ BACK BY AND VERIFIED WITH: PHARMD SCOTT CHRISTY 02/20/21@21 :17 BY TW Performed at [facility], [place] [address], [place], [place] [address]    Culture METHICILLIN RESISTANT STAPHYLOCOCCUS AUREUS (A)  Final   Report Status 02/23/2021 FINAL  Final   Organism ID, Bacteria METHICILLIN RESISTANT STAPHYLOCOCCUS AUREUS  Final      Susceptibility   Methicillin resistant staphylococcus aureus - MIC*    CIPROFLOXACIN >=8 RESISTANT Resistant     ERYTHROMYCIN >=8 RESISTANT Resistant     GENTAMICIN <=0.5 SENSITIVE Sensitive     OXACILLIN >=4 RESISTANT Resistant     TETRACYCLINE >=16 RESISTANT Resistant     VANCOMYCIN <=0.5 SENSITIVE Sensitive     TRIMETH/SULFA >=320 RESISTANT Resistant     CLINDAMYCIN <=0.25 SENSITIVE Sensitive     RIFAMPIN <=0.5 SENSITIVE Sensitive     Inducible Clindamycin NEGATIVE Sensitive     * METHICILLIN RESISTANT STAPHYLOCOCCUS AUREUS  Urine Culture     Status: Abnormal   Collection Time: 02/19/21  9:25 PM   Specimen: In/Out Cath Urine  Result Value Ref Range Status   Specimen Description   Final    IN/OUT CATH URINE Performed at [facility] [address] [place], [place] [address]    Special Requests   Final    NONE Performed at [facility], [place] [address] [place], [place] [address]    Culture MULTIPLE SPECIES PRESENT, SUGGEST RECOLLECTION (A)  Final   Report Status 02/21/2021 FINAL  Final  Resp Panel by RT-PCR (Flu A&B, Covid) Nasopharyngeal Swab     Status: None   Collection Time: 02/19/21  9:25 PM   Specimen: Nasopharyngeal Swab; Nasopharyngeal(NP) swabs in vial transport medium  Result Value Ref Range Status   SARS Coronavirus 2 by RT PCR NEGATIVE NEGATIVE Final    Comment: (NOTE) SARS-CoV-2 target nucleic acids are NOT DETECTED.  The SARS-CoV-2 RNA is  generally detectable in upper respiratory specimens during the acute phase of infection. The lowest concentration of SARS-CoV-2 viral copies this assay can detect is 138 copies/mL. A negative result does not preclude SARS-Cov-2 infection and should not be used as the sole basis for treatment or other patient management decisions. A negative result may occur with  improper specimen collection/handling, submission of specimen other than nasopharyngeal swab, presence of viral mutation(s) within the areas targeted by this assay, and inadequate number of viral copies(<138 copies/mL). A negative result must be combined with clinical observations, patient history, and epidemiological information. The expected result is Negative.  Fact Sheet for Patients:  EntrepreneurPulse.com.au  Fact Sheet for Healthcare Providers:  IncredibleEmployment.be  This test is no t yet approved or cleared by the [place] FDA and  has been authorized for detection and/or diagnosis of SARS-CoV-2 by FDA under an Emergency Use Authorization (EUA). This EUA will remain  in effect (meaning this test can be used) for the duration of the COVID-19 declaration under Section 564(b)(1) of the Act, 21 U.S.C.section 360bbb-3(b)(1), unless the authorization is terminated  or revoked sooner.       Influenza  A by PCR NEGATIVE NEGATIVE Final   Influenza B by PCR NEGATIVE NEGATIVE Final    Comment: (NOTE) The Xpert Xpress SARS-CoV-2/FLU/RSV plus assay is intended as an aid in the diagnosis of influenza from Nasopharyngeal swab specimens and should not be used as a sole basis for treatment. Nasal washings and aspirates are unacceptable for Xpert Xpress SARS-CoV-2/FLU/RSV testing.  Fact Sheet for Patients: EntrepreneurPulse.com.au  Fact Sheet for Healthcare Providers: IncredibleEmployment.be  This test is not yet approved or cleared by the [place] FDA and has been authorized for detection and/or diagnosis of SARS-CoV-2 by FDA under an Emergency Use Authorization (EUA). This EUA will remain in effect (meaning this test can be used) for the duration of the COVID-19 declaration under Section 564(b)(1) of the Act, 21 U.S.C. section 360bbb-3(b)(1), unless the authorization is terminated or revoked.  Performed at [facility], [place] [address], [place], [place] [address]   Blood Culture ID Panel (Reflexed)     Status: Abnormal   Collection Time: 02/19/21  9:25 PM  Result Value Ref Range Status   Enterococcus faecalis NOT DETECTED NOT DETECTED Final   Enterococcus Faecium NOT DETECTED NOT DETECTED Final   Listeria monocytogenes NOT DETECTED NOT DETECTED Final   Staphylococcus species DETECTED (A) NOT DETECTED Final    Comment: CRITICAL RESULT CALLED TO, READ BACK BY AND VERIFIED WITH: PHARMD SCOTT CHRISTY 02/20/21@21 :17 BY TW    Staphylococcus aureus (BCID) DETECTED (A) NOT DETECTED Final    Comment: Methicillin (oxacillin)-resistant Staphylococcus aureus (MRSA). MRSA is predictably resistant to beta-lactam antibiotics (except ceftaroline). Preferred therapy is vancomycin unless clinically contraindicated. Patient requires contact precautions if  hospitalized. CRITICAL RESULT CALLED TO, READ BACK BY AND VERIFIED WITH: PHARMD SCOTT CHRISTY 02/20/21@21 :17 BY TW    Staphylococcus epidermidis NOT DETECTED NOT DETECTED Final   Staphylococcus lugdunensis NOT DETECTED NOT DETECTED Final   Streptococcus species NOT DETECTED NOT DETECTED Final   Streptococcus agalactiae NOT DETECTED NOT DETECTED Final   Streptococcus pneumoniae NOT DETECTED NOT DETECTED Final   Streptococcus pyogenes NOT DETECTED NOT DETECTED Final   A.calcoaceticus-baumannii NOT DETECTED NOT DETECTED Final   Bacteroides fragilis NOT DETECTED NOT DETECTED Final   Enterobacterales NOT DETECTED NOT DETECTED Final   Enterobacter cloacae complex NOT  DETECTED NOT DETECTED Final   Escherichia coli NOT DETECTED NOT DETECTED Final   Klebsiella aerogenes NOT DETECTED NOT DETECTED Final   Klebsiella oxytoca NOT DETECTED NOT DETECTED Final   Klebsiella pneumoniae NOT DETECTED NOT DETECTED Final   Proteus species NOT DETECTED NOT DETECTED Final   Salmonella species NOT DETECTED NOT DETECTED Final   Serratia marcescens NOT DETECTED NOT DETECTED Final   Haemophilus influenzae NOT DETECTED NOT DETECTED Final   Neisseria meningitidis NOT DETECTED NOT DETECTED Final   Pseudomonas aeruginosa NOT DETECTED NOT DETECTED Final   Stenotrophomonas maltophilia NOT DETECTED NOT DETECTED Final   Candida albicans NOT DETECTED NOT DETECTED Final   Candida auris NOT DETECTED NOT DETECTED Final   Candida glabrata NOT DETECTED NOT DETECTED Final   Candida krusei NOT DETECTED NOT DETECTED Final   Candida parapsilosis NOT DETECTED NOT DETECTED Final   Candida tropicalis NOT DETECTED NOT DETECTED Final   Cryptococcus neoformans/gattii NOT DETECTED NOT DETECTED Final   Meth resistant mecA/C and MREJ DETECTED (A) NOT DETECTED Final    Comment: CRITICAL RESULT CALLED TO, READ BACK BY AND VERIFIED WITH: PHARMD SCOTT CHRISTY 02/20/21@21 :17 BY TW Performed at [facility], [address] [place], [place] [address]  Culture, blood (routine x 2)     Status: None (Preliminary result)   Collection Time: 02/21/21 11:40 AM   Specimen: BLOOD  Result Value Ref Range Status   Specimen Description   Final    BLOOD BLOOD LEFT HAND Performed at [facility] [address], [place], [place] [address]    Special Requests   Final    BOTTLES DRAWN AEROBIC AND ANAEROBIC Blood Culture adequate volume Performed at [facility] [address] [place], [place] [address]    Culture   Final    NO GROWTH 4 DAYS Performed at [facility], [place] [address], [place], [place] [address]    Report Status PENDING  Incomplete  Culture,  blood (routine x 2)     Status: None (Preliminary result)   Collection Time: 02/21/21 11:40 AM   Specimen: BLOOD  Result Value Ref Range Status   Specimen Description   Final    BLOOD RIGHT ANTECUBITAL Performed at [facility] [address] [place], [place] [address]    Special Requests   Final    BOTTLES DRAWN AEROBIC ONLY Blood Culture adequate volume Performed at [facility] [address], [place], [place] [address]    Culture   Final    NO GROWTH 4 DAYS Performed at [facility], [place] [address] [place],  [address]    Report Status PENDING  Incomplete     Labs:  COVID-19 Labs  Recent Labs    02/23/21 0901 02/25/21 0802  CRP 3.0* 1.1*    Lab Results  Component Value Date   SARSCOV2NAA NEGATIVE 02/19/2021   SARSCOV2NAA NEGATIVE 01/11/2021   SARSCOV2NAA NEGATIVE 11/27/2020   SARSCOV2NAA Detected (A) 06/09/2020      Basic Metabolic Panel: Recent Labs  Lab 02/21/21 0318 02/22/21 0256 02/23/21 0901 02/24/21 0403 02/25/21 0802  NA 138 140 139 137 135  K 4.4 4.5 3.9 4.5 4.2  CL 107 108 110 106 105  CO2 23 25 24 25  21*  GLUCOSE 104* 94 122* 95 116*  BUN 12 16 14 15 16   CREATININE 0.84 0.81 0.66 0.71 0.78  CALCIUM 8.7* 8.5* 8.7* 8.6* 8.3*  MG 2.0 2.1 2.1 2.1 2.1   Liver Function Tests: Recent Labs  Lab 02/19/21 2125  AST 20  ALT 22  ALKPHOS 84  BILITOT 1.0  PROT 6.8  ALBUMIN 2.8*    CBC: Recent Labs  Lab 02/19/21 2125 02/20/21 0334 02/21/21 0318 02/22/21 0256 02/23/21 0901 02/25/21 0802  WBC 10.4 12.1* 9.5 7.8 7.0 9.7  NEUTROABS 8.0*  --  6.9 4.9 4.5 6.7  HGB 11.3* 11.8* 11.3* 10.8* 11.6* 12.0*  HCT 35.0* 36.4* 36.1* 33.9* 35.7* 36.5*  MCV 97.0 96.0 98.1 98.0 95.7 94.6  PLT 332 339 306 325 350 PLATELET CLUMPS NOTED ON SMEAR, UNABLE TO ESTIMATE      IMAGING STUDIES CT ABDOMEN PELVIS W CONTRAST  Result Date: 02/21/2021 CLINICAL DATA:  Soft tissue abscess near the scrotum. MRSA  bacteremia. EXAM: CT ABDOMEN AND PELVIS WITH CONTRAST TECHNIQUE: Multidetector CT imaging of the abdomen and pelvis was performed using the standard protocol following bolus administration of intravenous contrast. CONTRAST:  69mL OMNIPAQUE IOHEXOL 350 MG/ML SOLN COMPARISON:  None. FINDINGS: Lower chest: Mild dependent opacity at the posterior lung bases, greater on the left, consistent with atelectasis. Trace left pleural effusion. Trace pericardial effusion. No convincing acute abnormality. Hepatobiliary: Liver normal in size and attenuation. No masses. Several calcifications  consistent with healed granuloma. Normal gallbladder. No bile duct dilation. Pancreas: Unremarkable. No pancreatic ductal dilatation or surrounding inflammatory changes. Spleen: Normal size. Scattered calcifications consistent with healed granuloma. No masses. Adrenals/Urinary Tract: 1.7 cm right adrenal mass. No significant washout between the initial and delayed post-contrast sequences. Mass is nonspecific. Normal left adrenal gland. Mild bilateral renal cortical thinning. Symmetric renal enhancement and excretion. Subcentimeter low-density lesion, posterior lower pole of the left kidney, consistent with a cyst. No other renal masses, no stones and no hydronephrosis. Normal ureters. Bladder minimally distended, Foley catheter at the bladder base. There is mild bladder wall thickening and non dependent air. Stomach/Bowel: Normal stomach. Small bowel and colon are normal in caliber. No wall thickening. No inflammation. Numerous colonic diverticula without diverticulitis. No evidence of appendicitis. Vascular/Lymphatic: Significant aortic atherosclerosis. No aneurysm. No enlarged lymph nodes. Reproductive: Enlarged prostate, 5.9 x 5.5 x 5.8 cm. Other: Subcutaneous soft tissue inflammatory change along the inferior margins of both buttocks. No soft tissue abscess. No abdominal wall hernia. No ascites. Musculoskeletal: No fracture or acute  finding.  No bone lesion. IMPRESSION: 1. No evidence of an abscess. Perineal region subcutaneous inflammatory changes. 2. Bladder wall mildly thickened, which may be due to lack of distension possibly from chronic bladder outlet obstruction due to prostate enlargement. Consider cystitis in the proper clinical setting. 3. No other evidence of an acute abnormality within the abdomen or pelvis. 4. 1.7 cm indeterminate right adrenal mass. In this age group, specific follow-up is not recommended unless center is a history of a primary malignancy. 5. Aortic atherosclerosis. 6. Prostate enlargement. Electronically Signed   By: Lajean Manes M.D.   On: 02/21/2021 13:23   DG Chest Port 1 View  Result Date: 02/19/2021 CLINICAL DATA:  Fevers and possible sepsis EXAM: PORTABLE CHEST 1 VIEW COMPARISON:  01/11/2021 FINDINGS: Cardiac shadow is stable. The lungs are clear bilaterally. No focal infiltrate or effusion is noted. No bony abnormality is seen. Mild aortic calcifications are noted. IMPRESSION: No acute abnormality noted. Electronically Signed   By: Inez Catalina M.D.   On: 02/19/2021 21:09   ECHOCARDIOGRAM COMPLETE  Result Date: 02/22/2021    ECHOCARDIOGRAM REPORT   Patient Name:   DEMAREON COLDWELL Date of Exam: 02/22/2021 Medical Rec #:  [id number]          Height:       69.0 in Accession #:    [id number]         Weight:       132.3 lb Date of Birth:  08/23/1923         BSA:          1.733 m Patient Age:    4 years           BP:           139/70 mmHg Patient Gender: M                  HR:           78 bpm. Exam Location:  Inpatient Procedure: 2D Echo, Color Doppler and Cardiac Doppler Indications:    Bacteremia R78.81  History:        Patient has prior history of Echocardiogram examinations, most                 recent 06/28/2016. Signs/Symptoms:Alzheimer's.  Sonographer:    Raquel Sarna Senior RDCS Referring Phys: [id number] [place]  1. Left ventricular ejection fraction, by estimation, is 60 to 65%. The  left ventricle has normal function. The left ventricle has no regional wall motion abnormalities. There is mild asymmetric left ventricular hypertrophy of the basal and septal segments. Left ventricular diastolic parameters were normal.  2. Right ventricular systolic function is normal. The right ventricular size is normal. There is mildly elevated pulmonary artery systolic pressure.  3. Left atrial size was mildly dilated.  4. The mitral valve is normal in structure. Trivial mitral valve regurgitation. No evidence of mitral stenosis.  5. The aortic valve is tricuspid. There is moderate calcification of the aortic valve. Aortic valve regurgitation is not visualized. Moderate sclerosis no stenosis.  6. The inferior vena cava is normal in size with greater than 50% respiratory variability, suggesting right atrial pressure of 3 mmHg. FINDINGS  Left Ventricle: Left ventricular ejection fraction, by estimation, is 60 to 65%. The left ventricle has normal function. The left ventricle has no regional wall motion abnormalities. The left ventricular internal cavity size was normal in size. There is  mild asymmetric left ventricular hypertrophy of the basal and septal segments. Left ventricular diastolic parameters were normal. Right Ventricle: The right ventricular size is normal. No increase in right ventricular wall thickness. Right ventricular systolic function is normal. There is mildly elevated pulmonary artery systolic pressure. The tricuspid regurgitant velocity is 2.32  m/s, and with an assumed right atrial pressure of 15 mmHg, the estimated right ventricular systolic pressure is 68.1 mmHg. Left Atrium: Left atrial size was mildly dilated. Right Atrium: Right atrial size was normal in size. Pericardium: There is no evidence of pericardial effusion. Mitral Valve: The mitral valve is normal in structure. Trivial mitral valve regurgitation. No evidence of mitral valve stenosis. Tricuspid Valve: The tricuspid valve is  normal in structure. Tricuspid valve regurgitation is trivial. No evidence of tricuspid stenosis. Aortic Valve: The aortic valve is tricuspid. There is moderate calcification of the aortic valve. Aortic valve regurgitation is not visualized. Moderate sclerosis no stenosis. Pulmonic Valve: The pulmonic valve was normal in structure. Pulmonic valve regurgitation is trivial. No evidence of pulmonic stenosis. Aorta: The aortic root is normal in size and structure. Venous: The inferior vena cava is normal in size with greater than 50% respiratory variability, suggesting right atrial pressure of 3 mmHg. IAS/Shunts: No atrial level shunt detected by color flow Doppler.  LEFT VENTRICLE PLAX 2D LVIDd:         4.30 cm  Diastology LVIDs:         2.70 cm  LV e' medial:    7.07 cm/s LV PW:         1.10 cm  LV E/e' medial:  9.5 LV IVS:        1.30 cm  LV e' lateral:   7.40 cm/s LVOT diam:     2.40 cm  LV E/e' lateral: 9.1 LV SV:         98 LV SV Index:   56 LVOT Area:     4.52 cm  RIGHT VENTRICLE RV S prime:     17.40 cm/s TAPSE (M-mode): 2.2 cm LEFT ATRIUM           Index       RIGHT ATRIUM           Index LA diam:      3.80 cm 2.19 cm/m  RA Area:     17.10 cm LA Vol (A2C): 57.1 ml 32.95 ml/m RA Volume:   50.50 ml  29.14 ml/m LA Vol (A4C): 45.8 ml 26.43 ml/m  AORTIC  VALVE LVOT Vmax:   91.40 cm/s LVOT Vmean:  65.700 cm/s LVOT VTI:    0.216 m  AORTA Ao Root diam: 3.40 cm Ao Asc diam:  3.70 cm MITRAL VALVE               TRICUSPID VALVE MV Area (PHT): 2.94 cm    TR Peak grad:   21.5 mmHg MV Decel Time: 258 msec    TR Vmax:        232.00 cm/s MV E velocity: 67.30 cm/s MV A velocity: 73.70 cm/s  SHUNTS MV E/A ratio:  0.91        Systemic VTI:  0.22 m                            Systemic Diam: 2.40 cm Jenkins Rouge MD Electronically signed by Jenkins Rouge MD Signature Date/Time: 02/22/2021/9:55:34 AM    Final     DISCHARGE EXAMINATION: Vitals:   02/23/21 2251 02/24/21 0500 02/24/21 2154 02/25/21 0649  BP: 128/81 (!) 165/86 (!)  110/58 (!) 164/71  Pulse: 71 84 73 70  Resp: 20 20 17 16   Temp: 98.4 F (36.9 C) 98.8 F (37.1 C) 98.5 F (36.9 C) 98.6 F (37 C)  TempSrc:  Oral Oral Oral  SpO2: 97% 96% 98% 96%  Weight:      Height:       General appearance: Pleasantly confused Resp: Clear to auscultation bilaterally.  Normal effort Cardio: S1-S2 is normal regular.  No S3-S4.  No rubs murmurs or bruit GI: Abdomen is soft.  Nontender nondistended.  Bowel sounds are present normal.  No masses organomegaly Skin: Erythematous rash appreciated.  Over the left flank.  Scaly areas appreciated.  No active drainage.  No obvious central clearing noted.   DISPOSITION: Home with wife  Discharge Instructions     Call MD for:  difficulty breathing, headache or visual disturbances   Complete by: As directed    Call MD for:  extreme fatigue   Complete by: As directed    Call MD for:  persistant dizziness or light-headedness   Complete by: As directed    Call MD for:  persistant nausea and vomiting   Complete by: As directed    Call MD for:  severe uncontrolled pain   Complete by: As directed    Call MD for:  temperature >100.4   Complete by: As directed    Diet - low sodium heart healthy   Complete by: As directed    Discharge instructions   Complete by: As directed    Please take your medications as prescribed.    You were cared for by a hospitalist during your hospital stay. If you have any questions about your discharge medications or the care you received while you were in the hospital after you are discharged, you can call the unit and asked to speak with the hospitalist on call if the hospitalist that took care of you is not available. Once you are discharged, your primary care physician will handle any further medical issues. Please note that NO REFILLS for any discharge medications will be authorized once you are discharged, as it is imperative that you return to your primary care physician (or establish a  relationship with a primary care physician if you do not have one) for your aftercare needs so that they can reassess your need for medications and monitor your lab values. If you do not have a primary  care physician, you can call [phone] for a physician referral.   Increase activity slowly   Complete by: As directed    No wound care   Complete by: As directed          Allergies as of 02/25/2021       Reactions   Doxycycline Swelling   Lower lip swelling   Tessalon [benzonatate] Swelling   Lower lip swelling        Medication List     STOP taking these medications    rivastigmine 13.3 MG/24HR Commonly known as: EXELON       TAKE these medications    acetaminophen 500 MG tablet Commonly known as: TYLENOL Take 1,000 mg by mouth every 6 (six) hours as needed for fever.   aspirin 325 MG EC tablet Take 1 tablet (325 mg total) by mouth daily. What changed: how much to take   linezolid 600 MG tablet Commonly known as: Zyvox Take 1 tablet (600 mg total) by mouth 2 (two) times daily for 10 days.   nystatin-triamcinolone cream Commonly known as: MYCOLOG II Apply topically 2 (two) times daily. Apply to rash in left flank area   tamsulosin 0.4 MG Caps capsule Commonly known as: FLOMAX Take 1 capsule (0.4 mg total) by mouth at bedtime.   vitamin A & D ointment Apply 1 application topically 2 (two) times daily. After using toilet          Follow-up Information     [facility].   Contact information: [place] [address] [place], [place] Follow up.   Specialty: Home Health Services Why: PT Contact information: [place] [address] [phone]         Jabier Mutton, MD Follow up in 1 week(s).   Specialty: Infectious Diseases Contact information: [address] [place] [address] [phone]                 TOTAL DISCHARGE  TIME: 35 minutes  [facility] Pager on [email or web address]  02/25/2021, 1:12 PM

## 2021-02-25 NOTE — Progress Notes (Signed)
The patient is alert and oriented and has been seen by his physician. The orders for discharge were written. IV has been removed. Went over discharge instructions with patient and family. He is being discharged via wheelchair with all of his belongings.  

## 2021-02-26 ENCOUNTER — Telehealth: Payer: Self-pay | Admitting: *Deleted

## 2021-02-26 LAB — CULTURE, BLOOD (ROUTINE X 2)
Culture: NO GROWTH
Culture: NO GROWTH
Special Requests: ADEQUATE
Special Requests: ADEQUATE

## 2021-02-26 NOTE — Telephone Encounter (Signed)
Transition Care Management Unsuccessful Follow-up Telephone Call  Date of discharge and from where:  [facility] 02-25-2021  Attempts:  1st Attempt  Reason for unsuccessful TCM follow-up call:  Left voice message

## 2021-02-27 DIAGNOSIS — R338 Other retention of urine: Secondary | ICD-10-CM | POA: Diagnosis not present

## 2021-02-27 DIAGNOSIS — A4152 Sepsis due to Pseudomonas: Secondary | ICD-10-CM | POA: Diagnosis not present

## 2021-02-27 DIAGNOSIS — D72829 Elevated white blood cell count, unspecified: Secondary | ICD-10-CM | POA: Diagnosis not present

## 2021-02-27 DIAGNOSIS — G301 Alzheimer's disease with late onset: Secondary | ICD-10-CM | POA: Diagnosis not present

## 2021-02-27 DIAGNOSIS — T83511A Infection and inflammatory reaction due to indwelling urethral catheter, initial encounter: Secondary | ICD-10-CM | POA: Diagnosis not present

## 2021-02-27 DIAGNOSIS — N390 Urinary tract infection, site not specified: Secondary | ICD-10-CM | POA: Diagnosis not present

## 2021-02-27 DIAGNOSIS — R1310 Dysphagia, unspecified: Secondary | ICD-10-CM | POA: Diagnosis not present

## 2021-02-27 DIAGNOSIS — J9601 Acute respiratory failure with hypoxia: Secondary | ICD-10-CM | POA: Diagnosis not present

## 2021-02-27 DIAGNOSIS — N401 Enlarged prostate with lower urinary tract symptoms: Secondary | ICD-10-CM | POA: Diagnosis not present

## 2021-03-02 DIAGNOSIS — D72829 Elevated white blood cell count, unspecified: Secondary | ICD-10-CM | POA: Diagnosis not present

## 2021-03-02 DIAGNOSIS — R1310 Dysphagia, unspecified: Secondary | ICD-10-CM | POA: Diagnosis not present

## 2021-03-02 DIAGNOSIS — T83511A Infection and inflammatory reaction due to indwelling urethral catheter, initial encounter: Secondary | ICD-10-CM | POA: Diagnosis not present

## 2021-03-02 DIAGNOSIS — N401 Enlarged prostate with lower urinary tract symptoms: Secondary | ICD-10-CM | POA: Diagnosis not present

## 2021-03-02 DIAGNOSIS — R338 Other retention of urine: Secondary | ICD-10-CM | POA: Diagnosis not present

## 2021-03-02 DIAGNOSIS — G301 Alzheimer's disease with late onset: Secondary | ICD-10-CM | POA: Diagnosis not present

## 2021-03-02 DIAGNOSIS — N390 Urinary tract infection, site not specified: Secondary | ICD-10-CM | POA: Diagnosis not present

## 2021-03-02 DIAGNOSIS — J9601 Acute respiratory failure with hypoxia: Secondary | ICD-10-CM | POA: Diagnosis not present

## 2021-03-02 DIAGNOSIS — A4152 Sepsis due to Pseudomonas: Secondary | ICD-10-CM | POA: Diagnosis not present

## 2021-03-02 LAB — SUSCEPTIBILITY RESULT

## 2021-03-02 LAB — CULTURE, BLOOD (ROUTINE X 2): Special Requests: ADEQUATE

## 2021-03-02 LAB — MISC LABCORP TEST (SEND OUT): Labcorp test code: 182261

## 2021-03-02 LAB — SUSCEPTIBILITY, AER + ANAEROB

## 2021-03-02 LAB — AEROBIC BACTERIA, ID BY SEQ.

## 2021-03-03 ENCOUNTER — Telehealth: Payer: Self-pay

## 2021-03-03 NOTE — Telephone Encounter (Signed)
Received written orders for PT, Placed on PCP desk to review and sign, if appropriate.

## 2021-03-04 ENCOUNTER — Telehealth: Payer: Self-pay

## 2021-03-04 NOTE — Telephone Encounter (Signed)
Yes okay 

## 2021-03-04 NOTE — Telephone Encounter (Signed)
Hilliard Clark from [facility] calling for verbal orders.  Nurse will see patient tomorrow, that will be last visit.  Approval to discharge patient after home health visit tomorrow.  Please call Hilliard Clark at [phone].  This is a secured email, please leave message if he does not answer.

## 2021-03-04 NOTE — Telephone Encounter (Signed)
Patient is scheduled for Friday 10/7 with Dr. Anitra Lauth

## 2021-03-04 NOTE — Telephone Encounter (Signed)
Ok for orders? 

## 2021-03-04 NOTE — Telephone Encounter (Signed)
Signed and put in box to go up front. Signed:  Phil Travus Oren, MD           03/04/2021  

## 2021-03-04 NOTE — Telephone Encounter (Signed)
Patient wife states that daycare that patient enrolled/attends daily; is requesting letter from Dr. Anitra Lauth that patient is okay to return to daycare after discharge from hospital last week.  Please email letter to [email or web address]

## 2021-03-04 NOTE — Telephone Encounter (Signed)
Pt has not been seen for hospital follow up evaluation, please assist patient with scheduling, thanks.

## 2021-03-05 DIAGNOSIS — A4152 Sepsis due to Pseudomonas: Secondary | ICD-10-CM | POA: Diagnosis not present

## 2021-03-05 DIAGNOSIS — R338 Other retention of urine: Secondary | ICD-10-CM | POA: Diagnosis not present

## 2021-03-05 DIAGNOSIS — G301 Alzheimer's disease with late onset: Secondary | ICD-10-CM | POA: Diagnosis not present

## 2021-03-05 DIAGNOSIS — J9601 Acute respiratory failure with hypoxia: Secondary | ICD-10-CM | POA: Diagnosis not present

## 2021-03-05 DIAGNOSIS — R1310 Dysphagia, unspecified: Secondary | ICD-10-CM | POA: Diagnosis not present

## 2021-03-05 DIAGNOSIS — H903 Sensorineural hearing loss, bilateral: Secondary | ICD-10-CM | POA: Insufficient documentation

## 2021-03-05 DIAGNOSIS — D72829 Elevated white blood cell count, unspecified: Secondary | ICD-10-CM | POA: Diagnosis not present

## 2021-03-05 DIAGNOSIS — N390 Urinary tract infection, site not specified: Secondary | ICD-10-CM | POA: Diagnosis not present

## 2021-03-05 DIAGNOSIS — N401 Enlarged prostate with lower urinary tract symptoms: Secondary | ICD-10-CM | POA: Diagnosis not present

## 2021-03-05 DIAGNOSIS — N319 Neuromuscular dysfunction of bladder, unspecified: Secondary | ICD-10-CM | POA: Insufficient documentation

## 2021-03-05 DIAGNOSIS — T83511A Infection and inflammatory reaction due to indwelling urethral catheter, initial encounter: Secondary | ICD-10-CM | POA: Diagnosis not present

## 2021-03-05 NOTE — Telephone Encounter (Signed)
Secure VM left regarding VO, office and fax number provided

## 2021-03-06 ENCOUNTER — Ambulatory Visit (INDEPENDENT_AMBULATORY_CARE_PROVIDER_SITE_OTHER): Payer: Medicare PPO | Admitting: Family Medicine

## 2021-03-06 ENCOUNTER — Encounter: Payer: Self-pay | Admitting: Family Medicine

## 2021-03-06 ENCOUNTER — Other Ambulatory Visit: Payer: Self-pay

## 2021-03-06 VITALS — BP 136/84 | HR 71 | Temp 98.1°F

## 2021-03-06 DIAGNOSIS — N138 Other obstructive and reflux uropathy: Secondary | ICD-10-CM

## 2021-03-06 DIAGNOSIS — Z978 Presence of other specified devices: Secondary | ICD-10-CM

## 2021-03-06 DIAGNOSIS — N401 Enlarged prostate with lower urinary tract symptoms: Secondary | ICD-10-CM | POA: Diagnosis not present

## 2021-03-06 DIAGNOSIS — R7881 Bacteremia: Secondary | ICD-10-CM | POA: Diagnosis not present

## 2021-03-06 DIAGNOSIS — F03C0 Unspecified dementia, severe, without behavioral disturbance, psychotic disturbance, mood disturbance, and anxiety: Secondary | ICD-10-CM

## 2021-03-06 DIAGNOSIS — B9562 Methicillin resistant Staphylococcus aureus infection as the cause of diseases classified elsewhere: Secondary | ICD-10-CM | POA: Diagnosis not present

## 2021-03-06 NOTE — Progress Notes (Signed)
03/06/2021  CC:  Chief Complaint  Patient presents with   Hospitalization Follow-up    Patient is a 87 y.o. Caucasian male who presents accompanied by wife [name] for  hospital follow up, specifically [facility] Services face-to-face visit. Dates hospitalized: 9/22-9/28, 2022. Days since d/c from hospital: 9 days Patient was discharged from hospital to home Reason for admission to hospital: fever and generalized weakness. Date of interactive (phone) contact with patient and/or caregiver: attempts on 02/26/21.  I have reviewed patient's discharge summary plus pertinent specific notes, labs, and imaging from the hospitalization.   Brought to ED d/t generalized weakness and fever to 101.  Workup showed abnl UA but pt has indwelling foley cath so their was skepticism that this illness was d/t UTI.  Urine clx mult species. Blood clx showed MRSA 9/22, pt was put on IV abx.  CT abd neg for abscess.  Echo neg for vegetations.  ID MD recommended switch-over to liezolid and he was d/c'd home to finish 10d of this med.  Blood clx done on 9/24 were neg x 5d.  INTERIM HX:  He is essentially back to his baseline state of health.  Tolerating antibiotic without problem.  Eating and drinking fine.  No diarrhea.  He has Foley in as usual and urine is normal color and without foul smell.  Urine output normal. Wife [name] is contemplating the next step as far as possibly making a choice to have [name] start in a nursing home.  Palliative care saw him and plans to follow-up every 4 weeks  Medication reconciliation was done today and patient is taking meds as recommended by discharging hospitalist/specialist.    PMH:  Past Medical History:  Diagnosis Date   Actinic keratoses    Adrenal hemorrhage ([place]) 2001   s/p fall from ladder--a f/u CT abd showed resolution of the hemorrhage- (duodenal polyp incidentally noted;  f/u EGD by Dr. Watt Climes 03/2000 showed small lipomatous mass in duodenum which was confirmed  by biopsy.   Alzheimer's dementia ([place])    with behav disturbance and apathy.  [facility] referral as of [facility].  They added namenda 07/2014 due to pt's signif decline in the prior 6 mo.   Arthralgia of knee, right 2009   exam suspicious for meniscal origin   BPH (benign prostatic hypertrophy)    History of acute urinary retention (2006)  Hx of elevated PSA (7.26) + right sided prostate nodule on DRE by urologist.  PSA did not go down with tx with antibiotics--biopsy was done and showed NO MALIGNANCY but some chronic inflammation was present--turned out related to acute cystitis (enterococcus UTI).  PSA was 1.17 in 07/2011 at urologist's (Dr. Karsten Ro).  F/u at urol is now prn as of 08/2014.   BPPV (benign paroxysmal positional vertigo)    COVID-19 virus infection 06/09/2020   DDD (degenerative disc disease), cervical    C5-6, C6-7 primarily   Headache in back of head    Neurologist treated him with PT for this problem and it helped   Hearing deficit    Bilateral; noise damage in [employer]  Has had hearing aids since about 2008.   Herpes zoster 07/25/2018   suspected--valtrex rx'd.   History of acute cystitis 08/2009   with acute prostatitis (enterococcus faecalis)   History of solitary pulmonary nodule    Initially noted on CT abd for his adrenal hemorrhage s/p fall from ladder 2001.  F/u CT chest 04/2000 showed that it had shrunk from 81mm to 84mm and  had no malignant features.   History of TIA (transient ischemic attack) @ 2008   02/2001 and 01/2006 carotid dopplers showed 50% or less stenosis.   Left bundle branch hemiblock    mentioned in dx of old records 04/15/2009   Migraine headache with aura onset in teens   No migraines since about 2004   Poor dentition    As of 07/2019 being referred to oral surgeon for removal of all implants and all remaining teeth   RSV bronchitis 01/2020   Skin cancer     PSH:  Past Surgical History:  Procedure Laterality Date   Carotid dopplers   05/2016   1-39% stenosis bilat ICA.   cataract extrac     dental     implants   PROSTATE BIOPSY  04/07/2005   For elevated PSA + right sided prostate nodule on DRE by urologist:  benign biopsy.   TONSILLECTOMY  preteen   TRANSTHORACIC ECHOCARDIOGRAM  06/28/2016   EF 60-65%, mild dilatation of ascending aorta.    MEDS:  Outpatient Medications Prior to Visit  Medication Sig Dispense Refill   aspirin 325 MG EC tablet Take 1 tablet (325 mg total) by mouth daily. (Patient taking differently: Take 162.5 mg by mouth daily.) 90 tablet 3   linezolid (ZYVOX) 600 MG tablet Take 1 tablet (600 mg total) by mouth 2 (two) times daily for 10 days. 20 tablet 0   Nystatin (GERHARDT'S BUTT CREAM) CREA Apply 1 application topically.     nystatin-triamcinolone (MYCOLOG II) cream Apply topically 2 (two) times daily. Apply to rash in left flank area 30 g 0   tamsulosin (FLOMAX) 0.4 MG CAPS capsule Take 1 capsule (0.4 mg total) by mouth at bedtime.     Vitamins A & D (VITAMIN A & D) ointment Apply 1 application topically 2 (two) times daily. After using toilet     acetaminophen (TYLENOL) 500 MG tablet Take 1,000 mg by mouth every 6 (six) hours as needed for fever. (Patient not taking: Reported on 03/06/2021)     No facility-administered medications prior to visit.   EXAM:  Vitals with BMI 03/06/2021 02/25/2021 02/24/2021  Height - - -  Weight - - -  BMI - - -  Systolic - 270 623  Diastolic - 71 58  Pulse 71 70 73  O2 sat 98% on RA today  General: Sitting in wheelchair and intermittently attentive.  Vocalizing nonverbally occasionally, randomly.  Certainly in no distress. Cardiovascular: Regular rhythm and rate, no murmur.  Abdomen is soft and nondistended.  Lungs are clear, breathing nonlabored.  Extremities are without cyanosis or edema.  He has very mild pinkish discoloration of skin in the genitourinary area.  No maceration.  Pertinent labs/imaging   Chemistry      Component Value Date/Time   NA 135  02/25/2021 0802   K 4.2 02/25/2021 0802   CL 105 02/25/2021 0802   CO2 21 (L) 02/25/2021 0802   BUN 16 02/25/2021 0802   CREATININE 0.78 02/25/2021 0802   CREATININE 1.02 09/05/2015 1528      Component Value Date/Time   CALCIUM 8.3 (L) 02/25/2021 0802   ALKPHOS 84 02/19/2021 2125   AST 20 02/19/2021 2125   ALT 22 02/19/2021 2125   BILITOT 1.0 02/19/2021 2125     Lab Results  Component Value Date   WBC 9.7 02/25/2021   HGB 12.0 (L) 02/25/2021   HCT 36.5 (L) 02/25/2021   MCV 94.6 02/25/2021   PLT PLATELET CLUMPS NOTED ON  SMEAR, UNABLE TO ESTIMATE 02/25/2021    ASSESSMENT/PLAN:  #1: MRSA bacteremia: Back to baseline clinical status and finishing up linezolid.  Unclear source.  His follow-up blood cultures were appropriately negative.  2.:  Dementia.  Nonverbal and needs 24/7 care.  He is quite a difficult load for his wife, but she does excellent and I think her care has kept him healthy over and above what he would have gotten in a nursing home all this time.  I support her decision no matter what she decides, but I did tell her today that I think it would be best if she continue caring for him at home.  Palliative care will follow up every month.  3.  BPH with lower urinary tract obstruction.  He is dependent on Foley catheter.  Recent urine culture at hospitalization showed multiple species.  It appears grossly normal today   Medical decision making of moderate complexity was utilized today.  FOLLOW UP:  4 wks  Signed:  Crissie Sickles, MD           03/06/2021

## 2021-03-10 ENCOUNTER — Telehealth: Payer: Self-pay

## 2021-03-10 NOTE — Telephone Encounter (Addendum)
Received written orders from [facility] for resumption of care and add on discipline. Placed on PCP desk to review and sign, if appropriate.

## 2021-03-11 DIAGNOSIS — T83511A Infection and inflammatory reaction due to indwelling urethral catheter, initial encounter: Secondary | ICD-10-CM | POA: Diagnosis not present

## 2021-03-11 DIAGNOSIS — J9601 Acute respiratory failure with hypoxia: Secondary | ICD-10-CM | POA: Diagnosis not present

## 2021-03-11 DIAGNOSIS — D72829 Elevated white blood cell count, unspecified: Secondary | ICD-10-CM | POA: Diagnosis not present

## 2021-03-11 DIAGNOSIS — N401 Enlarged prostate with lower urinary tract symptoms: Secondary | ICD-10-CM | POA: Diagnosis not present

## 2021-03-11 DIAGNOSIS — R1310 Dysphagia, unspecified: Secondary | ICD-10-CM | POA: Diagnosis not present

## 2021-03-11 DIAGNOSIS — G301 Alzheimer's disease with late onset: Secondary | ICD-10-CM | POA: Diagnosis not present

## 2021-03-11 DIAGNOSIS — N390 Urinary tract infection, site not specified: Secondary | ICD-10-CM | POA: Diagnosis not present

## 2021-03-11 DIAGNOSIS — A4152 Sepsis due to Pseudomonas: Secondary | ICD-10-CM | POA: Diagnosis not present

## 2021-03-11 DIAGNOSIS — R338 Other retention of urine: Secondary | ICD-10-CM | POA: Diagnosis not present

## 2021-03-11 NOTE — Telephone Encounter (Signed)
Signed and put in box to go up front. Signed:  Crissie Sickles, MD           03/11/2021

## 2021-03-11 NOTE — Telephone Encounter (Signed)
All forms faxed with fax confirmation.

## 2021-03-12 ENCOUNTER — Telehealth: Payer: Self-pay

## 2021-03-12 NOTE — Telephone Encounter (Signed)
Pt's wife advised regarding update.

## 2021-03-12 NOTE — Telephone Encounter (Signed)
Wife calling to ask if Mitchell Abbott can review  patient's chart to see when his last cathedral change was.  She thinks it was around 9/23.  Please call [name] at [phone]

## 2021-03-12 NOTE — Telephone Encounter (Signed)
LM for return call, last catheter change was 9/28

## 2021-03-19 DIAGNOSIS — J9601 Acute respiratory failure with hypoxia: Secondary | ICD-10-CM | POA: Diagnosis not present

## 2021-03-19 DIAGNOSIS — N401 Enlarged prostate with lower urinary tract symptoms: Secondary | ICD-10-CM | POA: Diagnosis not present

## 2021-03-19 DIAGNOSIS — R338 Other retention of urine: Secondary | ICD-10-CM | POA: Diagnosis not present

## 2021-03-19 DIAGNOSIS — N390 Urinary tract infection, site not specified: Secondary | ICD-10-CM | POA: Diagnosis not present

## 2021-03-19 DIAGNOSIS — T83511A Infection and inflammatory reaction due to indwelling urethral catheter, initial encounter: Secondary | ICD-10-CM | POA: Diagnosis not present

## 2021-03-19 DIAGNOSIS — A4152 Sepsis due to Pseudomonas: Secondary | ICD-10-CM | POA: Diagnosis not present

## 2021-03-19 DIAGNOSIS — G301 Alzheimer's disease with late onset: Secondary | ICD-10-CM | POA: Diagnosis not present

## 2021-03-19 DIAGNOSIS — D72829 Elevated white blood cell count, unspecified: Secondary | ICD-10-CM | POA: Diagnosis not present

## 2021-03-19 DIAGNOSIS — R1310 Dysphagia, unspecified: Secondary | ICD-10-CM | POA: Diagnosis not present

## 2021-03-23 ENCOUNTER — Ambulatory Visit (INDEPENDENT_AMBULATORY_CARE_PROVIDER_SITE_OTHER): Payer: Medicare PPO | Admitting: Family Medicine

## 2021-03-23 ENCOUNTER — Encounter: Payer: Self-pay | Admitting: Family Medicine

## 2021-03-23 ENCOUNTER — Other Ambulatory Visit: Payer: Self-pay

## 2021-03-23 VITALS — BP 106/65 | HR 83 | Temp 98.2°F | Ht 67.0 in

## 2021-03-23 DIAGNOSIS — N390 Urinary tract infection, site not specified: Secondary | ICD-10-CM | POA: Diagnosis not present

## 2021-03-23 DIAGNOSIS — R8290 Unspecified abnormal findings in urine: Secondary | ICD-10-CM | POA: Diagnosis not present

## 2021-03-23 DIAGNOSIS — N3001 Acute cystitis with hematuria: Secondary | ICD-10-CM | POA: Diagnosis not present

## 2021-03-23 LAB — POCT URINALYSIS DIPSTICK
Bilirubin, UA: POSITIVE
Blood, UA: POSITIVE
Glucose, UA: NEGATIVE
Ketones, UA: NEGATIVE
Nitrite, UA: POSITIVE
Protein, UA: POSITIVE — AB
Spec Grav, UA: 1.01 (ref 1.010–1.025)
Urobilinogen, UA: 1 E.U./dL
pH, UA: 7.5 (ref 5.0–8.0)

## 2021-03-23 MED ORDER — CIPROFLOXACIN HCL 250 MG PO TABS: ORAL_TABLET | ORAL | 1 refills | Status: DC

## 2021-03-23 MED ORDER — CIPROFLOXACIN HCL 500 MG PO TABS: ORAL_TABLET | ORAL | 0 refills | Status: DC

## 2021-03-23 MED ORDER — CEFTRIAXONE SODIUM 1 G IJ SOLR
1.0000 g | Freq: Once | INTRAMUSCULAR | Status: AC
  Administered 2021-03-23: 1 g via INTRAMUSCULAR

## 2021-03-23 NOTE — Progress Notes (Signed)
OFFICE VISIT  03/23/2021  CC:  Chief Complaint  Patient presents with   Swelling    Located on left hand on Friday but has since decreased   Urinary issues    Cloudy and odor, currently still has catheter. Low grade fever on Saturday(100.5)   HPI:    Patient is a 87 y.o. male who presents accompanied by his wife [name] for urinary concerns. Mitchell Abbott has chronic indwelling foley cath d/t hx of recurrent urinary retention d/t bph. She noted increased lethargy + temp 100.5 two days ago.  He has been acting improved/back to baseline today, without fever. Urine in foley tubing and bag appears cloudy, dark yellow, has sediment present. No blood visible.  Past Medical History:  Diagnosis Date   Actinic keratoses    Adrenal hemorrhage ([place]) 2001   s/p fall from ladder--a f/u CT abd showed resolution of the hemorrhage- (duodenal polyp incidentally noted;  f/u EGD by Dr. Watt Climes 03/2000 showed small lipomatous mass in duodenum which was confirmed by biopsy.   Alzheimer's dementia ([place])    with behav disturbance and apathy.  [facility] referral as of 07/2013.  They added namenda 07/2014 due to pt's signif decline in the prior 6 mo.   Arthralgia of knee, right 2009   exam suspicious for meniscal origin   BPH (benign prostatic hypertrophy)    History of acute urinary retention (2006)  Hx of elevated PSA (7.26) + right sided prostate nodule on DRE by urologist.  PSA did not go down with tx with antibiotics--biopsy was done and showed NO MALIGNANCY but some chronic inflammation was present--turned out related to acute cystitis (enterococcus UTI).  PSA was 1.17 in 07/2011 at urologist's (Dr. Karsten Ro).  F/u at urol is now prn as of 08/2014.   BPPV (benign paroxysmal positional vertigo)    COVID-19 virus infection 06/09/2020   DDD (degenerative disc disease), cervical    C5-6, C6-7 primarily   Headache in back of head    Neurologist treated him with PT for this problem and it helped   Hearing deficit     Bilateral; noise damage in [employer]  Has had hearing aids since about 2008.   Herpes zoster 07/25/2018   suspected--valtrex rx'd.   History of acute cystitis 08/2009   with acute prostatitis (enterococcus faecalis)   History of solitary pulmonary nodule    Initially noted on CT abd for his adrenal hemorrhage s/p fall from ladder 2001.  F/u CT chest 04/2000 showed that it had shrunk from 33mm to 54mm and had no malignant features.   History of TIA (transient ischemic attack) @ 2008   02/2001 and 01/2006 carotid dopplers showed 50% or less stenosis.   Left bundle branch hemiblock    mentioned in dx of old records 04/15/2009   Migraine headache with aura onset in teens   No migraines since about 2004   Poor dentition    As of 07/2019 being referred to oral surgeon for removal of all implants and all remaining teeth   RSV bronchitis 01/2020   Skin cancer     Past Surgical History:  Procedure Laterality Date   Carotid dopplers  05/2016   1-39% stenosis bilat ICA.   cataract extrac     dental     implants   PROSTATE BIOPSY  04/07/2005   For elevated PSA + right sided prostate nodule on DRE by urologist:  benign biopsy.   TONSILLECTOMY  preteen   TRANSTHORACIC ECHOCARDIOGRAM  06/28/2016   EF  60-65%, mild dilatation of ascending aorta.    Outpatient Medications Prior to Visit  Medication Sig Dispense Refill   aspirin 325 MG EC tablet Take 1 tablet (325 mg total) by mouth daily. (Patient taking differently: Take 162.5 mg by mouth daily.) 90 tablet 3   Nystatin (GERHARDT'S BUTT CREAM) CREA Apply 1 application topically.     nystatin-triamcinolone (MYCOLOG II) cream Apply topically 2 (two) times daily. Apply to rash in left flank area 30 g 0   tamsulosin (FLOMAX) 0.4 MG CAPS capsule Take 1 capsule (0.4 mg total) by mouth at bedtime.     Vitamins A & D (VITAMIN A & D) ointment Apply 1 application topically 2 (two) times daily. After using toilet     acetaminophen (TYLENOL) 500 MG tablet  Take 1,000 mg by mouth every 6 (six) hours as needed for fever. (Patient not taking: No sig reported)     No facility-administered medications prior to visit.    Allergies  Allergen Reactions   Doxycycline Swelling    Lower lip swelling   Tessalon [Benzonatate] Swelling    Lower lip swelling    ROS As per HPI  PE: Vitals with BMI 03/23/2021 03/06/2021 02/25/2021  Height 5\' 7"  - -  Weight - - -  BMI - - -  Systolic 625 638 937  Diastolic 65 84 71  Pulse 83 71 70     Gen: sleeping as per his normal but arouses to voice and gentle touch, focuses as per normal.  Nonverbal. No edema. Urine in foley bag/tubing cloudy and dark yellow, whitish sediment present.  Tube is not obstructed. No blood.  LABS:    Chemistry      Component Value Date/Time   NA 135 02/25/2021 0802   K 4.2 02/25/2021 0802   CL 105 02/25/2021 0802   CO2 21 (L) 02/25/2021 0802   BUN 16 02/25/2021 0802   CREATININE 0.78 02/25/2021 0802   CREATININE 1.02 09/05/2015 1528      Component Value Date/Time   CALCIUM 8.3 (L) 02/25/2021 0802   ALKPHOS 84 02/19/2021 2125   AST 20 02/19/2021 2125   ALT 22 02/19/2021 2125   BILITOT 1.0 02/19/2021 2125     UA today (from foley bag) : positive blood, 3+ leuks, pos protein and bili.  IMPRESSION AND PLAN:  UTI, recurrent. This is in the setting of recurrent UTI+ recurrent urinary retention requiring chronic foley cath. Mild systemic sx's 2 d/a but cleared up. Appears at baseline here today. Plan: rocephin 1 g IM here today. Start cipro 500 mg bid x 5d tomorrow. After finishes 5d treatment then start cipro 250mg  qd for UTI prevention. Pt's wife to notify [facility] nurse that foley cath needs to be changed.  Pt being followed by palliative care as well.  An After Visit Summary was printed and given to the patient.  FOLLOW UP: Return if symptoms worsen or fail to improve.  Signed:  Crissie Sickles, MD           03/23/2021

## 2021-03-24 ENCOUNTER — Inpatient Hospital Stay: Payer: Medicare PPO | Admitting: Internal Medicine

## 2021-03-24 ENCOUNTER — Telehealth: Payer: Self-pay

## 2021-03-24 DIAGNOSIS — Z515 Encounter for palliative care: Secondary | ICD-10-CM | POA: Diagnosis not present

## 2021-03-24 DIAGNOSIS — E43 Unspecified severe protein-calorie malnutrition: Secondary | ICD-10-CM | POA: Diagnosis not present

## 2021-03-24 DIAGNOSIS — G301 Alzheimer's disease with late onset: Secondary | ICD-10-CM | POA: Diagnosis not present

## 2021-03-24 NOTE — Telephone Encounter (Signed)
Ok for orders? 

## 2021-03-24 NOTE — Telephone Encounter (Signed)
Yes okay 

## 2021-03-24 NOTE — Telephone Encounter (Signed)
Beth calling from [facility] regarding new verbal orders for patient.  Request for re-certification for cath management.  Please call Beth at [phone], this is her cell and it is secured to leave a voicemail.

## 2021-03-24 NOTE — Telephone Encounter (Signed)
Left secure VM regarding re-certification

## 2021-03-25 ENCOUNTER — Telehealth: Payer: Self-pay

## 2021-03-25 LAB — URINE CULTURE
MICRO NUMBER:: 12543247
SPECIMEN QUALITY:: ADEQUATE

## 2021-03-25 NOTE — Telephone Encounter (Signed)
Verbal order and most recent home health evaluation:  Mitchell Abbott from [facility] calling about patient visit. Patient wife told nurse that Mitchell Abbott has fallen/collapsed 4 times this week.  On 2 of those times, she has called EMS to come to the house, and helped him get back into bed.  The most recent 2 times, she didn't call because she felt embarrassed to call EMS for help. Mitchell Abbott told to her call EMS when & every time this happens because they can evaluate him at that time.  Home health nurse found both of them on the floor when she arrived.  Mitchell Abbott fell onto the floor overnight and Mitchell Abbott was unable to lift him.  Mitchell Abbott had gotten a lot of pillows and blankets to make him comfortable, and she laid there with him until nurse arrived to help pick him up off floor.  Mitchell Abbott has also expressed to her that Mitchell Abbott needs hospice.  She says that Mitchell Abbott is not understanding or possibly not coping well with this from her, and Mitchell Abbott is suggesting maybe she would receive it better from Dr. Anitra Lauth.    Mitchell Abbott also states that patient has a rash looks like yeast infection around groin area.  Mitchell Abbott is requesting verbal order to use med listed below.  He may need a refill on this. nystatin-triamcinolone (MYCOLOG II) cream [[id number]]    Mitchell Abbott can be reached at [phone] - her voicemail is secure.

## 2021-03-25 NOTE — Telephone Encounter (Signed)
Please review and advise.

## 2021-03-26 DIAGNOSIS — R1310 Dysphagia, unspecified: Secondary | ICD-10-CM | POA: Diagnosis not present

## 2021-03-26 DIAGNOSIS — N390 Urinary tract infection, site not specified: Secondary | ICD-10-CM | POA: Diagnosis not present

## 2021-03-26 DIAGNOSIS — I4460 Unspecified fascicular block: Secondary | ICD-10-CM | POA: Diagnosis not present

## 2021-03-26 DIAGNOSIS — N492 Inflammatory disorders of scrotum: Secondary | ICD-10-CM | POA: Diagnosis not present

## 2021-03-26 DIAGNOSIS — R338 Other retention of urine: Secondary | ICD-10-CM | POA: Diagnosis not present

## 2021-03-26 DIAGNOSIS — F02818 Dementia in other diseases classified elsewhere, unspecified severity, with other behavioral disturbance: Secondary | ICD-10-CM | POA: Diagnosis not present

## 2021-03-26 DIAGNOSIS — G301 Alzheimer's disease with late onset: Secondary | ICD-10-CM | POA: Diagnosis not present

## 2021-03-26 DIAGNOSIS — Z466 Encounter for fitting and adjustment of urinary device: Secondary | ICD-10-CM | POA: Diagnosis not present

## 2021-03-26 DIAGNOSIS — N401 Enlarged prostate with lower urinary tract symptoms: Secondary | ICD-10-CM | POA: Diagnosis not present

## 2021-03-26 NOTE — Telephone Encounter (Signed)
Nothing further needed 

## 2021-03-26 NOTE — Telephone Encounter (Signed)
This is good

## 2021-03-26 NOTE — Telephone Encounter (Signed)
Can pt and [name] do a virtual (or even telephone) visit at the end of the day tomorrow?

## 2021-03-26 NOTE — Telephone Encounter (Signed)
Pt currently scheduled for virtual at 3:30, 60 minute slot unless you would like changed.

## 2021-03-27 ENCOUNTER — Encounter: Payer: Self-pay | Admitting: Family Medicine

## 2021-03-27 ENCOUNTER — Telehealth (INDEPENDENT_AMBULATORY_CARE_PROVIDER_SITE_OTHER): Payer: Medicare PPO | Admitting: Family Medicine

## 2021-03-27 ENCOUNTER — Other Ambulatory Visit: Payer: Self-pay

## 2021-03-27 VITALS — BP 128/78

## 2021-03-27 DIAGNOSIS — F03C18 Unspecified dementia, severe, with other behavioral disturbance: Secondary | ICD-10-CM

## 2021-03-27 DIAGNOSIS — R5381 Other malaise: Secondary | ICD-10-CM | POA: Diagnosis not present

## 2021-03-27 DIAGNOSIS — R531 Weakness: Secondary | ICD-10-CM

## 2021-03-27 DIAGNOSIS — R296 Repeated falls: Secondary | ICD-10-CM | POA: Diagnosis not present

## 2021-03-27 NOTE — Progress Notes (Signed)
Virtual Visit via Video Note  I connected with Mitchell Abbott and his wife Mitchell Abbott on 03/27/21 at  3:30 PM EDT by a video enabled telemedicine application and verified that I am speaking with the correct person using two identifiers.  Location patient: home, [place] Location provider:work or home office Persons participating in the virtual visit: patient, provider  I discussed the limitations of evaluation and management by telemedicine and the availability of in person appointments. The patient expressed understanding and agreed to proceed.  HPI: Today's appointment was made to discuss Mitchell Abbott's ongoing functional decline and signif loss of quality of life.  Has been falling frequently lately.  Mitchell Abbott is unable to pick him up off the floor by herself when he falls and she has had to call EMS for help with this on 2 occasions in the last week. He has been in palliative care but some people have recommended he transition to hospice care and Mitchell Abbott wanted to discuss with me first.  Discussed Mitchell Abbott's ongoing decline today with Mitchell Abbott.  She is obviously worried and struggling with the decision about possibly getting Mitchell Abbott establish with hospice care.  His ongoing struggles have been mainly generalized weakness with recurrent falls, but he does have an indwelling Foley catheter and recurrent urinary infections.  ROS: See pertinent positives and negatives per HPI.  Past Medical History:  Diagnosis Date   Actinic keratoses    Adrenal hemorrhage ([place]) 2001   s/p fall from ladder--a f/u CT abd showed resolution of the hemorrhage- (duodenal polyp incidentally noted;  f/u EGD by Dr. Watt Climes 03/2000 showed small lipomatous mass in duodenum which was confirmed by biopsy.   Alzheimer's dementia ([place])    with behav disturbance and apathy.  [facility] referral as of [facility].  They added namenda 07/2014 due to pt's signif decline in the prior 6 mo.   Arthralgia of knee, right 2009   exam suspicious for meniscal origin   BPH  (benign prostatic hypertrophy)    History of acute urinary retention (2006)  Hx of elevated PSA (7.26) + right sided prostate nodule on DRE by urologist.  PSA did not go down with tx with antibiotics--biopsy was done and showed NO MALIGNANCY but some chronic inflammation was present--turned out related to acute cystitis (enterococcus UTI).  PSA was 1.17 in 07/2011 at urologist's (Dr. Karsten Ro).  F/u at urol is now prn as of 08/2014.   BPPV (benign paroxysmal positional vertigo)    COVID-19 virus infection 06/09/2020   DDD (degenerative disc disease), cervical    C5-6, C6-7 primarily   Headache in back of head    Neurologist treated him with PT for this problem and it helped   Hearing deficit    Bilateral; noise damage in [employer]  Has had hearing aids since about 2008.   Herpes zoster 07/25/2018   suspected--valtrex rx'd.   History of acute cystitis 08/2009   with acute prostatitis (enterococcus faecalis)   History of solitary pulmonary nodule    Initially noted on CT abd for his adrenal hemorrhage s/p fall from ladder 2001.  F/u CT chest 04/2000 showed that it had shrunk from 28mm to 8mm and had no malignant features.   History of TIA (transient ischemic attack) @ 2008   02/2001 and 01/2006 carotid dopplers showed 50% or less stenosis.   Left bundle branch hemiblock    mentioned in dx of old records 04/15/2009   Migraine headache with aura onset in teens   No migraines since about 2004  Poor dentition    As of 07/2019 being referred to oral surgeon for removal of all implants and all remaining teeth   RSV bronchitis 01/2020   Skin cancer     Past Surgical History:  Procedure Laterality Date   Carotid dopplers  05/2016   1-39% stenosis bilat ICA.   cataract extrac     dental     implants   PROSTATE BIOPSY  04/07/2005   For elevated PSA + right sided prostate nodule on DRE by urologist:  benign biopsy.   TONSILLECTOMY  preteen   TRANSTHORACIC ECHOCARDIOGRAM  06/28/2016   EF 60-65%,  mild dilatation of ascending aorta.     Current Outpatient Medications:    aspirin 325 MG EC tablet, Take 1 tablet (325 mg total) by mouth daily. (Patient taking differently: Take 162.5 mg by mouth daily.), Disp: 90 tablet, Rfl: 3   ciprofloxacin (CIPRO) 500 MG tablet, Take 500 mg by mouth 2 (two) times daily., Disp: , Rfl:    Nystatin (GERHARDT'S BUTT CREAM) CREA, Apply 1 application topically., Disp: , Rfl:    nystatin-triamcinolone (MYCOLOG II) cream, Apply topically 2 (two) times daily. Apply to rash in left flank area, Disp: 30 g, Rfl: 0   tamsulosin (FLOMAX) 0.4 MG CAPS capsule, Take 1 capsule (0.4 mg total) by mouth at bedtime., Disp: , Rfl:    Vitamins A & D (VITAMIN A & D) ointment, Apply 1 application topically 2 (two) times daily. After using toilet, Disp: , Rfl:    acetaminophen (TYLENOL) 500 MG tablet, Take 1,000 mg by mouth every 6 (six) hours as needed for fever. (Patient not taking: No sig reported), Disp: , Rfl:    ciprofloxacin (CIPRO) 250 MG tablet, 1 tab po qd (Patient not taking: Reported on 03/27/2021), Disp: 30 tablet, Rfl: 1  EXAM:  VITALS per patient if applicable:  Vitals with BMI 03/27/2021 03/23/2021 03/06/2021  Height - 5\' 7"  -  Weight - - -  BMI - - -  Systolic [phone]  Diastolic 78 65 84  Pulse - 83 71   GENERAL: alert, oriented, appears well and in no acute distress  HEENT: atraumatic, conjunttiva clear, no obvious abnormalities on inspection of external nose and ears  NECK: normal movements of the head and neck  LUNGS: on inspection no signs of respiratory distress, breathing rate appears normal, no obvious gross SOB, gasping or wheezing  CV: no obvious cyanosis  MS: moves all visible extremities without noticeable abnormality  PSYCH/NEURO: pleasant and cooperative, no obvious depression or anxiety, speech and thought processing grossly intact  ASSESSMENT AND PLAN:  Discussed the following assessment and plan:  Advanced dementia with  progressive physical decline resulting in recurrent falls and inability to be transferred from bed to chair chair to bed.  Despite Mitchell Abbott's ongoing excellent care it is now evident that Mitchell Abbott would benefit most from transitioning to hospice care.  He is currently attended to from a palliative care standpoint by Dr. Hollace Kinnier.  Mitchell Abbott will give her office a call and tell her her decision to transfer to hospice.  I will send Dr. Mariea Clonts a note as well. At this point we did not make any decisions regarding continuation of his urinary catheter but I suspect they will leave this in due to his significant urinary retention without it.  Taking it out with probably cause a lot of discomfort. Spent considerable time today just listening to [facility] and giving reassurance that this is the best decision for Mitchell Abbott.  Spent 18 min with pt today reviewing HPI, reviewing relevant past history, doing exam, reviewing and discussing lab and imaging data, and formulating plans.  F/u: to be determined based on hospice arrangements  Signed:  Crissie Sickles, MD           03/27/2021

## 2021-03-31 ENCOUNTER — Telehealth: Payer: Self-pay

## 2021-03-31 NOTE — Telephone Encounter (Signed)
Written orders received for comfort care for pt, Placed on PCP desk to review and sign, if appropriate.

## 2021-03-31 NOTE — Telephone Encounter (Signed)
Signed and put in box to go up front. Signed:  Crissie Sickles, MD           03/31/2021

## 2021-04-03 ENCOUNTER — Telehealth: Payer: Self-pay

## 2021-04-03 NOTE — Telephone Encounter (Signed)
Signed and put in box to go up front. Signed:  Crissie Sickles, MD           04/03/2021

## 2021-04-03 NOTE — Telephone Encounter (Signed)
Home health orders received 04/03/21 for Mitchell Abbott health initiation orders: Yes.  Home health re-certification orders: No. Patient last seen by ordering physician for this condition: 03/27/21. Must be less than 90 days for re-certification and less than 30 days prior for initiation. Visit must have been for the condition the orders are being placed.  Patient meets criteria for Physician to sign orders: Yes.        Current med list has been attached: No        Orders placed on physicians desk for signature: 04/03/21 (date) If patient does not meet criteria for orders to be signed: pt was called to schedule appt. Appt is scheduled for N/A.   Mitchell Abbott [name]  Please place on my desk to be faxed back.

## 2021-04-07 ENCOUNTER — Telehealth: Payer: Self-pay

## 2021-04-07 NOTE — Telephone Encounter (Signed)
Received written orders for [facility] certification and POC. Verbal orders received 10/25. Placed on PCP desk to review and sign, if appropriate.

## 2021-04-07 NOTE — Telephone Encounter (Signed)
Signed and put in box to go up front. Signed:  Crissie Sickles, MD           04/07/2021

## 2021-04-14 ENCOUNTER — Telehealth: Payer: Self-pay

## 2021-04-14 NOTE — Telephone Encounter (Signed)
Home health orders received 04/10/21 and 04/13/21 for Mitchell Abbott health initiation orders: Yes.  Home health re-certification orders: No. Patient last seen by ordering physician for this condition: 03/27/21. Must be less than 90 days for re-certification and less than 30 days prior for initiation. Visit must have been for the condition the orders are being placed.  Patient meets criteria for Physician to sign orders: Yes.        Current med list has been attached: Yes        Orders placed on physicians desk for signature: 04/14/21 (date) If patient does not meet criteria for orders to be signed: pt was called to schedule appt. Appt is scheduled for N/A.   [name]

## 2021-04-15 NOTE — Telephone Encounter (Signed)
Signed and put in box to go up front. Signed:  Crissie Sickles, MD           04/15/2021

## 2021-05-01 ENCOUNTER — Telehealth: Payer: Self-pay

## 2021-05-01 NOTE — Telephone Encounter (Signed)
[name], wife, called in Call back [phone]  She said pt has Cipro 250mg  taking 1/day. She is asking if she can double that for the UTI. Requesting call back.

## 2021-05-01 NOTE — Telephone Encounter (Signed)
Spoke with Mitchell Abbott and advised decision deferred to hospice MD provider. She will follow up with wife

## 2021-05-01 NOTE — Telephone Encounter (Signed)
I defer this to his hospice MD provider.

## 2021-05-01 NOTE — Telephone Encounter (Signed)
Tayla calling from [facility].  Patient has a UTI results came back to them today. Nurse is requesting antibiotic called to patient's local pharmacy - [facility] can be reached at [phone]

## 2021-05-01 NOTE — Telephone Encounter (Signed)
Please review and advise.

## 2021-05-08 ENCOUNTER — Telehealth: Payer: Self-pay | Admitting: Family Medicine

## 2021-05-08 NOTE — Telephone Encounter (Signed)
FYI  Please see below

## 2021-05-15 ENCOUNTER — Telehealth: Payer: Self-pay

## 2021-05-15 NOTE — Telephone Encounter (Signed)
Mitchell Abbott from [facility] states finished antibiotics for UTI  - still has discharge, still very weak  Needs more antibiotic?   Please [phone]

## 2021-05-15 NOTE — Telephone Encounter (Signed)
Mitchell Abbott with [facility] advised all orders deferred to hospice provider

## 2021-05-16 ENCOUNTER — Other Ambulatory Visit: Payer: Self-pay

## 2021-05-16 ENCOUNTER — Emergency Department (HOSPITAL_COMMUNITY)
Admission: EM | Admit: 2021-05-16 | Discharge: 2021-05-16 | Disposition: A | Discharge status: Home / Self Care | Attending: Emergency Medicine | Admitting: Emergency Medicine

## 2021-05-16 ENCOUNTER — Encounter (HOSPITAL_COMMUNITY): Payer: Self-pay

## 2021-05-16 DIAGNOSIS — D72829 Elevated white blood cell count, unspecified: Secondary | ICD-10-CM | POA: Diagnosis not present

## 2021-05-16 DIAGNOSIS — R3912 Poor urinary stream: Secondary | ICD-10-CM | POA: Diagnosis present

## 2021-05-16 DIAGNOSIS — Z87891 Personal history of nicotine dependence: Secondary | ICD-10-CM | POA: Diagnosis not present

## 2021-05-16 DIAGNOSIS — D649 Anemia, unspecified: Secondary | ICD-10-CM | POA: Insufficient documentation

## 2021-05-16 DIAGNOSIS — N5082 Scrotal pain: Secondary | ICD-10-CM | POA: Diagnosis not present

## 2021-05-16 DIAGNOSIS — T83098A Other mechanical complication of other indwelling urethral catheter, initial encounter: Secondary | ICD-10-CM | POA: Diagnosis not present

## 2021-05-16 DIAGNOSIS — Z7982 Long term (current) use of aspirin: Secondary | ICD-10-CM | POA: Diagnosis not present

## 2021-05-16 DIAGNOSIS — R531 Weakness: Secondary | ICD-10-CM | POA: Diagnosis not present

## 2021-05-16 DIAGNOSIS — G309 Alzheimer's disease, unspecified: Secondary | ICD-10-CM | POA: Diagnosis not present

## 2021-05-16 DIAGNOSIS — X58XXXA Exposure to other specified factors, initial encounter: Secondary | ICD-10-CM | POA: Diagnosis not present

## 2021-05-16 DIAGNOSIS — F0280 Dementia in other diseases classified elsewhere without behavioral disturbance: Secondary | ICD-10-CM | POA: Diagnosis not present

## 2021-05-16 DIAGNOSIS — Z8616 Personal history of COVID-19: Secondary | ICD-10-CM | POA: Insufficient documentation

## 2021-05-16 DIAGNOSIS — Z978 Presence of other specified devices: Secondary | ICD-10-CM

## 2021-05-16 DIAGNOSIS — N5089 Other specified disorders of the male genital organs: Secondary | ICD-10-CM

## 2021-05-16 DIAGNOSIS — N3001 Acute cystitis with hematuria: Secondary | ICD-10-CM | POA: Diagnosis not present

## 2021-05-16 LAB — URINALYSIS, ROUTINE W REFLEX MICROSCOPIC
Bilirubin Urine: NEGATIVE
Glucose, UA: NEGATIVE mg/dL
Ketones, ur: NEGATIVE mg/dL
Nitrite: NEGATIVE
Protein, ur: 30 mg/dL — AB
RBC / HPF: >50 RBC/hpf — ABNORMAL HIGH (ref 0–5)
Specific Gravity, Urine: 1.018 (ref 1.005–1.030)
WBC, UA: >50 WBC/hpf — ABNORMAL HIGH (ref 0–5)
pH: 6 (ref 5.0–8.0)

## 2021-05-16 MED ORDER — CEPHALEXIN 500 MG PO CAPS: 500.0000 mg | ORAL_CAPSULE | Freq: Two times a day (BID) | ORAL | 0 refills | Status: AC

## 2021-05-16 MED ORDER — CEPHALEXIN 500 MG PO CAPS
500.0000 mg | ORAL_CAPSULE | Freq: Once | ORAL | Status: AC
  Administered 2021-05-16: 500 mg via ORAL
  Filled 2021-05-16: qty 1

## 2021-05-16 MED ORDER — TAMSULOSIN HCL 0.4 MG PO CAPS
0.4000 mg | ORAL_CAPSULE | Freq: Every day | ORAL | Status: DC
  Administered 2021-05-16: 0.4 mg via ORAL
  Filled 2021-05-16: qty 1

## 2021-05-16 MED ORDER — LIDOCAINE HCL URETHRAL/MUCOSAL 2 % EX GEL
1.0000 "application " | Freq: Once | CUTANEOUS | Status: AC
  Administered 2021-05-16: 1 via URETHRAL
  Filled 2021-05-16: qty 11

## 2021-05-16 NOTE — ED Notes (Signed)
Bladder scan revealed 26mL in bladder.

## 2021-05-16 NOTE — Discharge Instructions (Signed)
You appear to have a urinary tract infection. Take Keflex 500 mg twice a day for 7 days. We have sent a urine culture as well that will take a few days to result. You will be contacted as needed if the antibiotics need to be changed. Thank you for allowing [place] to care for you.

## 2021-05-16 NOTE — ED Provider Notes (Signed)
[facility] DEPT Provider Note   CSN: [id number] Arrival date & time: 05/16/21  1835     History  Mitchell Abbott is a 87 y.o. male, hospice patient with [facility] with PMHx of late stage Alzheimer's dementia, sensorineural hearing loss bilaterally, who presents due to concerns of urinary retention.   History is obtained by EMS providers as patient is unable to provide any history.  Reports that urinary output decreased last night and this morning was minimal.  He is at 1600 mL of p.o. intake today.  Hospice nurse was able to replace the Foley twice, the second time he was able to get 250 mL out.  He seems to have significant pain on palpation to suprapubic and genital areas.  His last bowel movement was today.  EMS reports that his penis looks traumatic and it "look like he pulled" the Foley.  He has been afebrile, temperature with EMS was nine 9.2.  He recently completed antibiotic course for UTI and is now taking a daily Cipro for prophylaxis.  Other home medications include nystatin, tamsulosin and aspirin.  He has not had his tamsulosin today.  Was able to discuss with wife at the bedside who reports increased penile discharge and foul smelling urine. She was also concerned for some increased weakness in the last week. She is concerned that he may have another urinary tract infection. She reports his temperature at home was around 98 degrees which is high for him (around 96 degrees is baseline). She verbalizes that goals of care is comfort. She is amenable to checking for urinary tract infection but no aggressive measures.      Past Medical History:  Diagnosis Date   Actinic keratoses    Adrenal hemorrhage ([place]) 2001   s/p fall from ladder--a f/u CT abd showed resolution of the hemorrhage- (duodenal polyp incidentally noted;  f/u EGD by Dr. Watt Climes 03/2000 showed small lipomatous mass in duodenum which was confirmed by biopsy.   Alzheimer's dementia ([place])     with behav disturbance and apathy.  [facility] referral as of 07/[facility].  They added namenda 07/2014 due to pt's signif decline in the prior 6 mo.   Arthralgia of knee, right 2009   exam suspicious for meniscal origin   BPH (benign prostatic hypertrophy)    History of acute urinary retention (2006)  Hx of elevated PSA (7.26) + right sided prostate nodule on DRE by urologist.  PSA did not go down with tx with antibiotics--biopsy was done and showed NO MALIGNANCY but some chronic inflammation was present--turned out related to acute cystitis (enterococcus UTI).  PSA was 1.17 in 07/2011 at urologist's (Dr. Karsten Ro).  F/u at urol is now prn as of 08/2014.   BPPV (benign paroxysmal positional vertigo)    COVID-19 virus infection 06/09/2020   DDD (degenerative disc disease), cervical    C5-6, C6-7 primarily   Headache in back of head    Neurologist treated him with PT for this problem and it helped   Hearing deficit    Bilateral; noise damage in [employer]  Has had hearing aids since about 2008.   Herpes zoster 07/25/2018   suspected--valtrex rx'd.   History of acute cystitis 08/2009   with acute prostatitis (enterococcus faecalis)   History of solitary pulmonary nodule    Initially noted on CT abd for his adrenal hemorrhage s/p fall from ladder 2001.  F/u CT chest 04/2000 showed that it had shrunk from 36mm to 65mm and had no  malignant features.   History of TIA (transient ischemic attack) @ 2008   02/2001 and 01/2006 carotid dopplers showed 50% or less stenosis.   Left bundle branch hemiblock    mentioned in dx of old records 04/15/2009   Migraine headache with aura onset in teens   No migraines since about 2004   Poor dentition    As of 07/2019 being referred to oral surgeon for removal of all implants and all remaining teeth   RSV bronchitis 01/2020   Skin cancer     Patient Active Problem List   Diagnosis Date Noted   Neuromuscular dysfunction of bladder, unspecified 03/05/2021    Other retention of urine 03/05/2021   Sensorineural hearing loss, bilateral 03/05/2021   Pressure injury of skin 02/21/2021   MRSA bacteremia    Soft tissue infection    Complicated UTI (urinary tract infection) 02/19/2021   Generalized weakness 02/19/2021   Hyponatremia 02/19/2021   Protein-calorie malnutrition, severe 01/13/2021   Sepsis secondary to UTI ([place]) 01/12/2021   Seborrheic keratosis 12/26/2020   Lip swelling 02/23/2020   Hearing difficulty of both ears 08/21/2019   Urethral bleeding 09/02/2018   Rash 07/25/2018   Syncope 06/27/2016   Maxillary sinusitis 05/27/2015   Apathy 01/28/2015   Allergic rhinitis 01/20/2015   Bacterial conjunctivitis of left eye 11/05/2014   Alzheimer's disease with late onset ([place]) 01/15/2014   Chronic renal insufficiency, stage II (mild) 09/03/2013   Observation for suspected malignant neoplasm 09/03/2013   Preventative health care 09/03/2013   Orthostatic syncope 02/05/2013   Mild cognitive impairment 10/20/2012   Cervical myofascial strain 08/15/2012   Sacroiliac joint pain 02/18/2012   History of TIA (transient ischemic attack)    Benign prostatic hyperplasia 10/13/2011   Headache disorder 10/13/2011   Dementia ([place]) 10/13/2011    Past Surgical History:  Procedure Laterality Date   Carotid dopplers  05/2016   1-39% stenosis bilat ICA.   cataract extrac     dental     implants   PROSTATE BIOPSY  04/07/2005   For elevated PSA + right sided prostate nodule on DRE by urologist:  benign biopsy.   TONSILLECTOMY  preteen   TRANSTHORACIC ECHOCARDIOGRAM  06/28/2016   EF 60-65%, mild dilatation of ascending aorta.       Family History  Problem Relation Age of Onset   Sudden death Mother 20       FH info obtained from Dr. Ovid Curd records.   Breast cancer Daughter    Migraines Daughter     Social History   Tobacco Use   Smoking status: Former    Years: 2.00    Types: Cigarettes   Smokeless tobacco: Never  Substance Use  Topics   Alcohol use: Yes    Alcohol/week: 1.0 standard drink    Types: 1 Cans of beer per week    Comment: "a good drink every day" BEER/WINE   Drug use: No    Home Medications Prior to Admission medications   Medication Sig Start Date End Date Taking? Authorizing Provider  cephALEXin (KEFLEX) 500 MG capsule Take 1 capsule (500 mg total) by mouth 2 (two) times daily for 7 days. 05/16/21 05/23/21 Yes Sharion Settler, DO  acetaminophen (TYLENOL) 500 MG tablet Take 1,000 mg by mouth every 6 (six) hours as needed for fever. Patient not taking: No sig reported    [provider]  aspirin 325 MG EC tablet Take 1 tablet (325 mg total) by mouth daily. Patient taking differently: Take 162.5 mg  by mouth daily. 10/20/12   Marcial Pacas, MD  Nystatin (GERHARDT'S BUTT CREAM) CREA Apply 1 application topically.    [provider]  nystatin-triamcinolone (MYCOLOG II) cream Apply topically 2 (two) times daily. Apply to rash in left flank area 02/25/21   Bonnielee Haff, MD  tamsulosin (FLOMAX) 0.4 MG CAPS capsule Take 1 capsule (0.4 mg total) by mouth at bedtime. 02/25/21   Bonnielee Haff, MD  Vitamins A & D (VITAMIN A & D) ointment Apply 1 application topically 2 (two) times daily. After using toilet    [provider]    Allergies    Doxycycline and Tessalon [benzonatate]  Review of Systems   Review of Systems  Reason unable to perform ROS: Non-verbal, Alzheimers.   Physical Exam Updated Vital Signs BP 125/80 (BP Location: Left Arm)    Pulse 90    Temp 98.5 F (36.9 C) (Oral)    Resp 16    SpO2 98%   Physical Exam Constitutional:      Comments: Non-verbal, demented, b/l hearing aids in place  HENT:     Head: Normocephalic.  Cardiovascular:     Rate and Rhythm: Normal rate.     Comments: 2+ DP and radial pulses b/l Pulmonary:     Effort: Pulmonary effort is normal. No respiratory distress.     Breath sounds: Normal breath sounds.  Abdominal:     Comments:  Intermittently tender on palpation to RLQ. No R/G. No suprapubic tenderness. No abdominal distension. No masses felt.   Genitourinary:    Comments: Ventral erosion of glans penis. Foley catheter in place with yellow urine in foley bag. There is white dry patchy area to suprapubic region consistent with possible fungal infection. Moans out in pain on palpation of genitalia.    Skin:    Comments: Upper extremities and abdomen are warm. B/l feet are cool to touch   Neurological:     Mental Status: He is disoriented.     Comments: Non-verbal    ED Results / Procedures / Treatments   Labs (all labs ordered are listed, but only abnormal results are displayed) Labs Reviewed  URINALYSIS, ROUTINE W REFLEX MICROSCOPIC - Abnormal; Notable for the following components:      Result Value   APPearance HAZY (*)    Hgb urine dipstick SMALL (*)    Protein, ur 30 (*)    Leukocytes,Ua LARGE (*)    RBC / HPF >50 (*)    WBC, UA >50 (*)    Bacteria, UA MANY (*)    All other components within normal limits  URINE CULTURE  \ EKG None  Radiology No results found.  Procedures Procedures   Medications Ordered in ED Medications  lidocaine (XYLOCAINE) 2 % jelly 1 application (1 application Urethral Given 05/16/21 2043)  cephALEXin (KEFLEX) capsule 500 mg (500 mg Oral Given 05/16/21 2220)    ED Course  I have reviewed the triage vital signs and the nursing notes.  Pertinent labs & imaging results that were available during my care of the patient were reviewed by me and considered in my medical decision making (see chart for details).    MDM Rules/Calculators/A&P                         Mitchell Abbott is a 87 y.o. male who is non-verbal with Alzheimer's dementia and b/l sensorineural hearing loss, hospice patient with [facility] who presents to the ED for concerns of repeat UTI  vs urinary retention.   Vitals notable for HTN to 157/97. Patient is afebrile at 98.35F and HR, RR and SpO2 are  normal. He has significant pain with palpation of genitalia. There is significant penile erosion to ventral aspect of glans, likely 2/2 long-standing foley catheter. There is 100 cc of yellow urine in foley bag without obvious hematuria. His abdomen is intermittently tender on palpation to RLQ but not supra-pubically. His abdomen does not seem distended and is soft. Will provide home Tamsulosin dose, bladder scan, do UA and urine culture. I am unable to see previous urine culture from his most recent UA on 12/1, it is possible that he may have resistance to previous antibiotics though also possible that he is colonized and that we will be unable to isolate a single organism given his chronic indwelling catheter. Foley appears to be draining adequately.   Initial bladder scan with ~46 mL, no concern for retention at this time.   UA shows small hgb, 30 protein, large leukocytes, many bacteria, >50 WBC and >50 RBC.   Final Clinical Impression(s) / ED Diagnoses Final diagnoses:  Pain of male genitalia  Chronic indwelling Foley catheter  Alzheimer's dementia, unspecified dementia severity, unspecified timing of dementia onset, unspecified whether behavioral, psychotic, or mood disturbance or anxiety ([place])  Acute cystitis with hematuria    Rx / DC Orders ED Discharge Orders          Ordered    cephALEXin (KEFLEX) 500 MG capsule  2 times daily        05/16/21 2207             Sharion Settler, DO 05/17/21 1944    Carmin Muskrat, MD 05/17/21 2031

## 2021-05-16 NOTE — ED Triage Notes (Signed)
Patient presented to ED with c/o urinary retention. Patient pull out his foley cath today and was replace by the hospice nurse. Patient is nonverbal

## 2021-05-16 NOTE — ED Notes (Signed)
[facility] called for transport. 

## 2021-05-18 ENCOUNTER — Telehealth: Payer: Self-pay

## 2021-05-18 NOTE — Telephone Encounter (Signed)
Pt went to ED.    Mitchell Abbott Gender: Male DOB: 04/29/1924 Age: 87 Y 11 M 17 D Return Phone Number: [phone] (Primary) Address: City/ State/ Zip: [place]  [address] Client [facility] - Client Client Site [place] - Day Provider Crissie Sickles - MD Contact Type Call Who Is Calling Patient / Member / Family / Caregiver Call Type Triage / Clinical Caller Name Rashawd Laskaris Relationship To Patient Other Return Phone Number [phone] (Primary) Chief Complaint Urination Pain Reason for Call Symptomatic / Request for [place] states, husband has been taking antibiotic for a UTI. Has been weak, fever last night was 100.0. This morning fever 97.5. Gave him last night aspirin. Nurse came by yesterday. Can antibiotic be increased to 500mg  twice a day. Translation No Nurse Assessment Nurse: Della Goo, RN, Vicente Males Date/Time (Eastern Time): 05/16/2021 9:20:07 AM Confirm and document reason for call. If symptomatic, describe symptoms. ---Caller states husband is on abx for a UTI prevention, last night had a temp of 100.0 temporal. Temp this morning was 97.5 oral. Asking about increasing ciprofloxacin maintenance dose, currently 250mg  once a day but states they typically increase the dose when he develops symptoms after a UA. He is nonverbal, no indication of pain or discomfort. Has a catheter baseline, drainage noted around the tubing. Last changed Nov 28th. He is on hospice. Does the patient have any new or worsening symptoms? ---Yes Will a triage be completed? ---Yes Related visit to physician within the last 2 weeks? ---No Does the PT have any chronic conditions? (i.e. diabetes, asthma, this includes High risk factors for pregnancy, etc.) ---Yes List chronic conditions. ---stage 7 dementia, loss of muscle tone--baseline unable to stand at times Is this a behavioral health or  substance abuse call? ---No PLEASE NOTE: All timestamps contained within this report are represented as [place] Standard Time. CONFIDENTIALTY NOTICE: This fax transmission is intended only for the addressee. It contains information that is legally privileged, confidential or otherwise protected from use or disclosure. If you are not the intended recipient, you are strictly prohibited from reviewing, disclosing, copying using or disseminating any of this information or taking any action in reliance on or regarding this information. If you have received this fax in error, please notify us immediately by telephone so that we can arrange for its return to [place]. Phone: [phone], Toll-Free: [phone], Fax: [phone] Page: 2 of 3 Call Id: [id number] Guidelines Guideline Title Affirmed Question Affirmed Notes Nurse Date/Time Eilene Ghazi Time) Urinary Catheter (e.g. Foley) Symptoms and Questions Shock suspected (e.g., cold/pale/ clammy skin, too weak to stand, low BP, rapid pulse) Quandt, RN, Vicente Males 05/16/2021 9:24:28 AM Disp. Time Eilene Ghazi Time) Disposition Final User 05/16/2021 9:43:53 AM 911 Outcome Documentation Quandt, RN, Vicente Males Reason: caller states she is discussing recommendation with [facility] nurse, has not called ems yet. requested that she call back if she decides against ED so we can notify the oc per directives. caller verbalized understanding 05/16/2021 9:46:16 AM Paged On Call back to [facility], [place], Vicente Males 05/16/2021 10:02:01 AM Paged On Call back to [facility], [place], Vicente Males 05/16/2021 10:22:09 AM Paged On Call back to [facility], [place], Vicente Males 05/16/2021 9:37:34 AM Call EMS 911 Now Yes Quandt, RN, Allegra Lai Understands Yes Care Advice Given Per Guideline CALL EMS 911 NOW: * Triager Discretion: I'll call you back in a few minutes to be sure you were able to reach them. CARE ADVICE given per  Urinary Catheter Symptoms and Questions (Adult)  guideline. Comments User: Juanda Crumble, RN Date/Time Eilene Ghazi Time): 05/16/2021 9:38:14 AM [place] nurse reports worsening weakness from baseline, unable to stand without support Paging DoctorName Phone DateTime Result/ Outcome Message Type Notes Billey Chang- MD [phone] 05/16/2021 9:46:16 AM Called On Call Provider - Left Message Doctor Paged Billey Chang- MD [phone] 05/16/2021 10:02:01 AM Called On Call Provider - Left Message Doctor Paged PLEASE NOTE: All timestamps contained within this report are represented as [place] Standard Time. CONFIDENTIALTY NOTICE: This fax transmission is intended only for the addressee. It contains information that is legally privileged, confidential or otherwise protected from use or disclosure. If you are not the intended recipient, you are strictly prohibited from reviewing, disclosing, copying using or disseminating any of this information or taking any action in reliance on or regarding this information. If you have received this fax in error, please notify us immediately by telephone so that we can arrange for its return to [place]. Phone: [phone], Toll-Free: [phone], Fax: [phone] Page: 3 of 3 Call Id: [id number] Paging DoctorName Phone DateTime Result/ Outcome Message Type Notes Billey Chang- MD [phone] 05/16/2021 10:22:09 AM Called On Call Provider - Left Message Doctor Paged Billey Chang- MD 05/16/2021 10:38:38 AM Unable to Reach on call - Max Attempts Message Result unable to reach ocp for likely 911 refusal. Caller has not called back with refusal decision as requested by RN, but sounded unlikely to call with 911 follow up call. Attempted to notify ocp per directives. charge nurse notified

## 2021-05-19 LAB — URINE CULTURE: Culture: >=100000 — AB

## 2021-05-20 ENCOUNTER — Telehealth: Payer: Self-pay | Admitting: Emergency Medicine

## 2021-05-20 NOTE — Telephone Encounter (Signed)
Post ED Visit - Positive Culture Follow-up: Successful Patient Follow-Up  Culture assessed and recommendations reviewed by:  []  Elenor Quinones, Pharm.D. []  Heide Guile, Pharm.D., BCPS AQ-ID []  Parks Neptune, Pharm.D., BCPS []  Alycia Rossetti, Pharm.D., BCPS []  [place], Pharm.D., BCPS, AAHIVP []  Legrand Como, Pharm.D., BCPS, AAHIVP []  Salome Arnt, PharmD, BCPS []  Johnnette Gourd, PharmD, BCPS []  Hughes Better, PharmD, BCPS []  Leeroy Cha, PharmD Jimmy Footman PharmD  Positive urine culture  []  Patient discharged without antimicrobial prescription and treatment is now indicated [x]  Organism is resistant to prescribed ED discharge antimicrobial []  Patient with positive blood cultures  Changes discussed with ED provider: Astrid Drafts [facility] New antibiotic prescription stop cephalexin, Start Linezolid 600mg  po bid x 5 days Called to [facility]  Contacted patient, date 05/20/2021, time [place] 05/20/2021, 11:04 AM

## 2021-05-20 NOTE — Telephone Encounter (Signed)
Placed supplemental orders on PCP desk to review and sign, if appropriate.

## 2021-05-20 NOTE — Progress Notes (Signed)
ED Antimicrobial Stewardship Positive Culture Follow Up   Mitchell Abbott is an 87 y.o. male who presented to [facility] on 05/16/2021 with a chief complaint of No chief complaint on file.   Recent Results (from the past 720 hour(s))  Urine Culture     Status: Abnormal   Collection Time: 05/16/21  7:33 PM   Specimen: Urine, Catheterized  Result Value Ref Range Status   Specimen Description   Final    URINE, CATHETERIZED Performed at [facility] [address], [place], [place] [address]    Special Requests   Final    NONE Performed at [facility], [place] [address], [place], [place] [address]    Culture (A)  Final    >=100,000 COLONIES/mL METHICILLIN RESISTANT STAPHYLOCOCCUS AUREUS   Report Status 05/19/2021 FINAL  Final   Organism ID, Bacteria METHICILLIN RESISTANT STAPHYLOCOCCUS AUREUS (A)  Final      Susceptibility   Methicillin resistant staphylococcus aureus - MIC*    CIPROFLOXACIN >=8 RESISTANT Resistant     GENTAMICIN <=0.5 SENSITIVE Sensitive     NITROFURANTOIN 32 SENSITIVE Sensitive     OXACILLIN >=4 RESISTANT Resistant     TETRACYCLINE >=16 RESISTANT Resistant     VANCOMYCIN <=0.5 SENSITIVE Sensitive     TRIMETH/SULFA 160 RESISTANT Resistant     CLINDAMYCIN <=0.25 SENSITIVE Sensitive     RIFAMPIN <=0.5 SENSITIVE Sensitive     Inducible Clindamycin NEGATIVE Sensitive     * >=100,000 COLONIES/mL METHICILLIN RESISTANT STAPHYLOCOCCUS AUREUS    [x]  Treated with cephalexin, organism resistant to prescribed antimicrobial []  Patient discharged originally without antimicrobial agent and treatment is now indicated  New antibiotic prescription: Linezolid 600 mg BID x 5 days  Stop cephalexin  ED Provider: Genevive Bi, PA-C  Jimmy Footman, PharmD, BCPS, BCIDP Infectious Diseases Clinical Pharmacist Phone: [phone] 05/20/2021, 8:16 AM

## 2021-05-30 ENCOUNTER — Other Ambulatory Visit: Payer: Self-pay | Admitting: Family Medicine

## 2021-07-01 DEATH — deceased

## 2021-12-04 ENCOUNTER — Telehealth: Payer: Self-pay

## 2021-12-04 NOTE — Telephone Encounter (Signed)
Patient deceased.  Patient widow, Mitchell Abbott, is requesting additional diagnosis added to death certificate. She is trying to receive death benefit.  She is requesting to add PTSD w/ Dementia.  She brought in literature regarding PTSD w/ Dementia. Not requiring anything to be filled out and/or signed. Placed reading material in [place].  Mitchell Abbott is aware Dr. Anitra Lauth out of office.  Please call once he returns next week.  [phone]

## 2022-04-16 NOTE — Telephone Encounter (Signed)
Patient wife dropped off "example letter" for Dr. Anitra Lauth regarding receiving death benefit from deceased spouse, patient.  "Example Letter" placed in [place]  Please call [name] at [phone]

## 2022-04-16 NOTE — Telephone Encounter (Signed)
Form placed on PCP desk for review

## 2023-03-16 ENCOUNTER — Telehealth: Payer: Self-pay

## 2023-03-16 NOTE — Telephone Encounter (Signed)
Casey Burkitt calling from [facility] calling in regards to death benefit for deceased spouse of patient, Humberto Addo. Patient is unable to get VA benefits because of cause of death listed on death certificate. Last year 12-18-2021, that is phone encounter where wife was asking to change cause of death.   Mr. Bing Plume is requesting to speak to Dr. Milinda Cave. [phone].

## 2023-03-16 NOTE — Telephone Encounter (Signed)
Please advise    Gerald Stabs   [facility]   12/04/21 11:47 AM Note Patient deceased.  Patient widow, Lala Lund, is requesting additional diagnosis added to death certificate. She is trying to receive death benefit.   She is requesting to add PTSD w/ Dementia.   She brought in literature regarding PTSD w/ Dementia. Not requiring anything to be filled out and/or signed. Placed reading material in [place] inbox.   [name] is aware Dr. Milinda Cave out of office.  Please call once he returns next week.  [phone]
